# Patient Record
Sex: Male | Born: 1950 | Race: White | Hispanic: No | Marital: Married | Smoking: Current every day smoker
Health system: Southern US, Community
[De-identification: ages and names within clinical notes are randomized; demographics above are authoritative.]

## PROBLEM LIST (undated history)

## (undated) DIAGNOSIS — Z9289 Personal history of other medical treatment: Secondary | ICD-10-CM

## (undated) DIAGNOSIS — I480 Paroxysmal atrial fibrillation: Secondary | ICD-10-CM

## (undated) DIAGNOSIS — I2109 ST elevation (STEMI) myocardial infarction involving other coronary artery of anterior wall: Secondary | ICD-10-CM

## (undated) DIAGNOSIS — I2510 Atherosclerotic heart disease of native coronary artery without angina pectoris: Secondary | ICD-10-CM

## (undated) DIAGNOSIS — M549 Dorsalgia, unspecified: Secondary | ICD-10-CM

## (undated) DIAGNOSIS — Z87442 Personal history of urinary calculi: Secondary | ICD-10-CM

## (undated) DIAGNOSIS — F1020 Alcohol dependence, uncomplicated: Secondary | ICD-10-CM

## (undated) DIAGNOSIS — I10 Essential (primary) hypertension: Secondary | ICD-10-CM

## (undated) DIAGNOSIS — I2129 ST elevation (STEMI) myocardial infarction involving other sites: Secondary | ICD-10-CM

## (undated) DIAGNOSIS — K219 Gastro-esophageal reflux disease without esophagitis: Secondary | ICD-10-CM

## (undated) DIAGNOSIS — C679 Malignant neoplasm of bladder, unspecified: Secondary | ICD-10-CM

## (undated) DIAGNOSIS — G8929 Other chronic pain: Secondary | ICD-10-CM

## (undated) DIAGNOSIS — K922 Gastrointestinal hemorrhage, unspecified: Secondary | ICD-10-CM

## (undated) DIAGNOSIS — R0600 Dyspnea, unspecified: Secondary | ICD-10-CM

## (undated) HISTORY — PX: APPENDECTOMY: SHX54

## (undated) HISTORY — DX: Gastrointestinal hemorrhage, unspecified: K92.2

## (undated) HISTORY — PX: TRANSURETHRAL RESECTION OF PROSTATE: SHX73

## (undated) HISTORY — DX: Dorsalgia, unspecified: M54.9

## (undated) HISTORY — DX: Alcohol dependence, uncomplicated: F10.20

## (undated) HISTORY — DX: Other chronic pain: G89.29

## (undated) HISTORY — PX: CORONARY ANGIOPLASTY: SHX604

---

## 2006-12-28 ENCOUNTER — Encounter: Admission: RE | Admit: 2006-12-28 | Discharge: 2007-01-24 | Payer: Self-pay | Admitting: *Deleted

## 2009-07-23 ENCOUNTER — Encounter
Admission: RE | Admit: 2009-07-23 | Discharge: 2009-09-12 | Payer: Self-pay | Admitting: Physical Medicine and Rehabilitation

## 2009-09-15 ENCOUNTER — Encounter
Admission: RE | Admit: 2009-09-15 | Discharge: 2009-12-14 | Payer: Self-pay | Admitting: Physical Medicine and Rehabilitation

## 2013-06-30 ENCOUNTER — Encounter (INDEPENDENT_AMBULATORY_CARE_PROVIDER_SITE_OTHER): Payer: Self-pay | Admitting: *Deleted

## 2013-07-25 ENCOUNTER — Ambulatory Visit (INDEPENDENT_AMBULATORY_CARE_PROVIDER_SITE_OTHER): Payer: BC Managed Care – PPO | Admitting: Internal Medicine

## 2013-07-25 ENCOUNTER — Encounter (INDEPENDENT_AMBULATORY_CARE_PROVIDER_SITE_OTHER): Payer: Self-pay | Admitting: Internal Medicine

## 2013-07-25 ENCOUNTER — Encounter (INDEPENDENT_AMBULATORY_CARE_PROVIDER_SITE_OTHER): Payer: Self-pay | Admitting: *Deleted

## 2013-07-25 ENCOUNTER — Other Ambulatory Visit (INDEPENDENT_AMBULATORY_CARE_PROVIDER_SITE_OTHER): Payer: Self-pay | Admitting: *Deleted

## 2013-07-25 VITALS — BP 128/72 | HR 88 | Temp 98.5°F | Ht 71.5 in | Wt 178.3 lb

## 2013-07-25 DIAGNOSIS — F1010 Alcohol abuse, uncomplicated: Secondary | ICD-10-CM

## 2013-07-25 DIAGNOSIS — F17200 Nicotine dependence, unspecified, uncomplicated: Secondary | ICD-10-CM

## 2013-07-25 DIAGNOSIS — Z720 Tobacco use: Secondary | ICD-10-CM | POA: Insufficient documentation

## 2013-07-25 DIAGNOSIS — R1310 Dysphagia, unspecified: Secondary | ICD-10-CM

## 2013-07-25 NOTE — Patient Instructions (Signed)
EGD/ED with Dr. Rehman. The risks and benefits such as perforation, bleeding, and infection were reviewed with the patient and is agreeable. 

## 2013-07-25 NOTE — Progress Notes (Signed)
Subjective:     Patient ID: Darren Carr, male   DOB: 06-18-1951, 62 y.o.   MRN: [id number]  HPI Referred to our office by Prudy Feeler [facility] for esophageal stricture. He states foods are lodging in his lower esophagus. He ate "gizzards" about a month ago and it lodged in his lower esophagus. Breads will also lodge. Hx of esophageal stricture in the past and has been dilated. He says he was dilated at [facility]  ( I will get that report.) He is basically on a soft diet because he is edentulous (upper). Appetite is good for the most part. There has been no waste loss. No abdominal pain. He usually has a BM x 1 a day. No melena or bright red rectal bleeding.   Says he has a hx of an Upper GI bleed.   Family HX: Married. 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 He has 6 children. Five are in good health. One daughter in her 30s in in a NH with Schizophrenia.  Review of Systems see hpi Current Outpatient Prescriptions  Medication Sig Dispense Refill  . gabapentin (NEURONTIN) 300 MG capsule Take 300 mg by mouth at bedtime as needed.      Marland Kitchen omeprazole (PRILOSEC) 20 MG capsule Take 20 mg by mouth daily.      Marland Kitchen oxyCODONE (ROXICODONE) 15 MG immediate release tablet Take 15 mg by mouth every 4 (four) hours as needed for pain. 10mg  every 8 hrs.       No current facility-administered medications for this visit.  Marland Kitchen Past Medical History  Diagnosis Date  . Alcoholic   . Chronic back pain    Past Surgical History  Procedure Laterality Date  . Appendectomy     No Known Allergies       Objective:   Physical Exam  Filed Vitals:   07/25/13 1444  BP: 128/72  Pulse: 88  Temp: 98.5 F (36.9 C)  Height: 5' 11.5" (1.816 m)  Weight: 178 lb 4.8 oz (80.876 kg)   Alert and oriented. Skin warm and dry. Oral mucosa is moist.   . Sclera anicteric, conjunctivae is pink. Thyroid not enlarged. No cervical lymphadenopathy. Lungs clear. Heart regular rate and rhythm.  Abdomen is soft. Bowel sounds  are positive. No hepatomegaly. No abdominal masses felt. No tenderness.  No edema to lower extremities.        Assessment:   Solid food dysphagia. Esophageal stricture needs to be ruled out.     Plan:    EGD/ED with Dr. Karilyn Cota.   The risks and benefits such as perforation, bleeding, and infection were reviewed with the patient and is agreeable.

## 2013-08-02 ENCOUNTER — Ambulatory Visit (INDEPENDENT_AMBULATORY_CARE_PROVIDER_SITE_OTHER): Payer: Self-pay | Admitting: Internal Medicine

## 2013-08-07 ENCOUNTER — Encounter (HOSPITAL_COMMUNITY): Payer: Self-pay | Admitting: Pharmacy Technician

## 2013-08-08 ENCOUNTER — Encounter (HOSPITAL_COMMUNITY)
Admission: RE | Admit: 2013-08-08 | Discharge: 2013-08-08 | Disposition: A | Discharge status: Home / Self Care | Payer: BC Managed Care – PPO | Source: Ambulatory Visit | Attending: Internal Medicine | Admitting: Internal Medicine

## 2013-08-08 ENCOUNTER — Encounter (HOSPITAL_COMMUNITY): Payer: Self-pay

## 2013-08-08 ENCOUNTER — Other Ambulatory Visit: Payer: Self-pay

## 2013-08-08 DIAGNOSIS — Z01818 Encounter for other preprocedural examination: Secondary | ICD-10-CM | POA: Insufficient documentation

## 2013-08-08 DIAGNOSIS — Z01812 Encounter for preprocedural laboratory examination: Secondary | ICD-10-CM | POA: Insufficient documentation

## 2013-08-08 DIAGNOSIS — Z01810 Encounter for preprocedural cardiovascular examination: Secondary | ICD-10-CM | POA: Insufficient documentation

## 2013-08-08 LAB — BASIC METABOLIC PANEL
BUN: 9 mg/dL (ref 6–23)
CO2: 25 mEq/L (ref 19–32)
Calcium: 9.6 mg/dL (ref 8.4–10.5)
Creatinine, Ser: 0.85 mg/dL (ref 0.50–1.35)
GFR calc non Af Amer: >90 mL/min (ref >90)
Glucose, Bld: 157 mg/dL — ABNORMAL HIGH (ref 70–99)
Potassium: 3.9 mEq/L (ref 3.5–5.1)

## 2013-08-08 NOTE — Patient Instructions (Signed)
Darren Carr  08/08/2013   Your procedure is scheduled on:  08/16/2013  Report to [facility] at 08/16/2013  AM.  Call this number if you have problems the morning of surgery: [phone]   Remember:   Do not eat food or drink liquids after midnight.   Take these medicines the morning of surgery with A SIP OF WATER:  Neurontin, prilosec, oxycodone   Do not wear jewelry, make-up or nail polish.  Do not wear lotions, powders, or perfumes.   Do not shave 48 hours prior to surgery. Men may shave face and neck.  Do not bring valuables to the hospital.  [facility] is not responsible  for any belongings or valuables.               Contacts, dentures or bridgework may not be worn into surgery.  Leave suitcase in the car. After surgery it may be brought to your room.  For patients admitted to the hospital, discharge time is determined by your treatment team.               Patients discharged the day of surgery will not be allowed to drive home.  Name and phone number of your driver: family  Special Instructions: Shower using CHG 2 nights before surgery and the night before surgery.  If you shower the day of surgery use CHG.  Use special wash - you have one bottle of CHG for all showers.  You should use approximately 1/3 of the bottle for each shower.   Please read over the following fact sheets that you were given: Pain Booklet, Coughing and Deep Breathing, MRSA Information, Surgical Site Infection Prevention, Anesthesia Post-op Instructions and Care and Recovery After Surgery Esophageal Dilatation The esophagus is the long, narrow tube which carries food and liquid from the mouth to the stomach. Esophageal dilatation is the technique used to stretch a blocked or narrowed portion of the esophagus. This procedure is used when a part of the esophagus has become so narrow that it becomes difficult, painful or even impossible to swallow. This is generally an uncomplicated form of treatment.  When this is not successful, chest surgery may be required. This is a much more extensive form of treatment with a longer recovery time. CAUSES  Some of the more common causes of blockage or strictures of the esophagus are:  Narrowing from longstanding inflammation (soreness and redness) of the lower esophagus. This comes from the constant exposure of the lower esophagus to the acid which bubbles up from the stomach. Over time this causes scarring and narrowing of the lower esophagus.  Hiatal hernia in which a small part of the stomach bulges (herniates) up through the diaphragm. This can cause a gradual narrowing of the end of the esophagus.  Schatzki's Ring is a narrow ring of benign (non-cancerous) fibrous tissue which constricts the lower esophagus. The reason for this is not known.  Scleroderma is a connective tissue disorder that affects the esophagus and makes swallowing difficult.  Achalasia is an absence of nerves to the lower esophagus and to the esophageal sphincter. This is the circular muscle between the stomach and esophagus that relaxes to allow food into the stomach. After swallowing, it contracts to keep food in the stomach. This absence of nerves may be congenital (present since birth). This can cause irregular spasms of the lower esophageal muscle. This spasm does not open up to allow food and fluid through. The result is a  persistent blockage with subsequent slow trickling of the esophageal contents into the stomach.  Strictures may develop from swallowing materials which damage the esophagus. Some examples are strong acids or alkalis such as lye.  Growths such as benign (non-cancerous) and malignant (cancerous) tumors can block the esophagus.  Heredity (present since birth) causes. DIAGNOSIS  Your caregiver often suspects this problem by taking a medical history. They will also do a physical exam. They can then prove their suspicions using X-rays and endoscopy. Endoscopy is  an exam in which a tube like a small flexible telescope is used to look at your esophagus.  TREATMENT There are different stretching (dilating) techniques which can be used. Simple bougie dilatation may be done in the office. This usually takes only a couple minutes. A numbing (anesthetic) spray of the throat is used. Endoscopy, when done, is done in an endoscopy suite, under mild sedation. When fluoroscopy is used, the procedure is performed in X-ray. Other techniques require a little longer time. Recovery is usually quick. There is no waiting time to begin eating and drinking to test success of the treatment. Following are some of the methods used. Narrowing of the esophagus is treated by making it bigger. Commonly this is a mechanical problem which can be treated with stretching. This can be done in different ways. Your caregiver will discuss these with you. Some of the means used are:  A series of graduated (increasing thickness) flexible dilators can be used. These are weighted tubes passed through the esophagus into the stomach. The tubes used become progressively larger until the desired stretched size is reached. Graduated dilators are a simple and quick way of opening the esophagus. No visualization is required.  Another method is the use of endoscopy to place a flexible wire across the stricture. The endoscope is removed and the wire left in place. A dilator with a hole through it from end to end is guided down the esophagus and across the stricture. One or more of these dilators are passed over the wire. At the end of the exam, the wire is removed. This type of treatment may be performed in the X-ray department under fluoroscopy. An advantage of this procedure is the examiner is visualizing the end opening in the esophagus.  Stretching of the esophagus may be done using balloons. Deflated balloons are placed through the endoscope and across the stricture. This type of balloon dilatation is often  done at the time of endoscopy or fluoroscopy. Flexible endoscopy allows the examiner to directly view the stricture. A balloon is inserted in the deflated form into the area of narrowing. It is then inflated with air to a certain pressure that is pre-set for a given circumference. When inflated, it becomes sausage shaped, stretched, and makes the stricture larger.  Achalasia requires a longer larger balloon-type dilator. This is frequently done under X-ray control. In this situation, the spastic muscle fibers in the lower esophagus are stretched. All of the above procedures make the passage of food and water into the stomach easier. They also make it easier for stomach contents to reflux back into the esophagus. Special medications may be used following the procedure to help prevent further stricturing. Proton-pump inhibitor medications are good at decreasing the amount of acid in the stomach juice. When stomach juice refluxes into the esophagus, the juice is no longer as acidic and is less likely to burn or scar the esophagus. RISKS AND COMPLICATIONS Esophageal dilatation is usually performed effectively and without problems. Some  complications that can occur are:  A small amount of bleeding almost always happens where the stretching takes place. If this is too excessive it may require more aggressive treatment.  An uncommon complication is perforation (making a hole) of the esophagus. The esophagus is thin. It is easy to make a hole in it. If this happens, an operation may be necessary to repair this.  A small, undetected perforation could lead to an infection in the chest. This can be very serious. HOME CARE INSTRUCTIONS   If you received sedation for your procedure, do not drive, make important decisions, or perform any activities requiring your full coordination. Do not drink alcohol, take sedatives, or use any mind altering chemicals unless instructed by your caregiver.  You may use throat  lozenges or warm salt water gargles if you have throat discomfort  You can begin eating and drinking normally on return home unless instructed otherwise. Do not purposely try to force large chunks of food down to test the benefits of your procedure.  Mild discomfort can be eased with sips of ice water.  Medications for discomfort may or may not be needed. SEEK IMMEDIATE MEDICAL CARE IF:   You begin vomiting up blood.  You develop black tarry stools  You develop chills or an unexplained temperature of over 101 F (38.3 C)  You develop chest or abdominal pain.  You develop shortness of breath or feel lightheaded or faint.  Your swallowing is becoming more painful, difficult, or you are unable to swallow. MAKE SURE YOU:   Understand these instructions.  Will watch your condition.  Will get help right away if you are not doing well or get worse. Document Released: 10/22/2005 Document Revised: 11/23/2011 Document Reviewed: 12/09/2005 [facility] Patient Information 2014 [place], [place]. Esophagogastroduodenoscopy Esophagogastroduodenoscopy (EGD) is a procedure to examine the lining of the esophagus, stomach, and first part of the small intestine (duodenum). A long, flexible, lighted tube with a camera attached (endoscope) is inserted down the throat to view these organs. This procedure is done to detect problems or abnormalities, such as inflammation, bleeding, ulcers, or growths, in order to treat them. The procedure lasts about 5 20 minutes. It is usually an outpatient procedure, but it may need to be performed in emergency cases in the hospital. LET YOUR CAREGIVER KNOW ABOUT:   Allergies to food or medicine.  All medicines you are taking, including vitamins, herbs, eyedrops, and over-the-counter medicines and creams.  Use of steroids (by mouth or creams).  Previous problems you or members of your family have had with the use of anesthetics.  Any blood disorders you  have.  Previous surgeries you have had.  Other health problems you have.  Possibility of pregnancy, if this applies. RISKS AND COMPLICATIONS  Generally, EGD is a safe procedure. However, as with any procedure, complications can occur. Possible complications include:  Infection.  Bleeding.  Tearing (perforation) of the esophagus, stomach, or duodenum.  Difficulty breathing or not being able to breath.  Excessive sweating.  Spasms of the larynx.  Slowed heartbeat.  Low blood pressure. BEFORE THE PROCEDURE  Do not eat or drink anything for 6 8 hours before the procedure or as directed by your caregiver.  Ask your caregiver about changing or stopping your regular medicines.  If you wear dentures, be prepared to remove them before the procedure.  Arrange for someone to drive you home after the procedure. PROCEDURE   A vein will be accessed to give medicines and fluids. A medicine  to relax you (sedative) and a pain reliever will be given through that access into the vein.  A numbing medicine (local anesthetic) may be sprayed on your throat for comfort and to stop you from gagging or coughing.  A mouth guard may be placed in your mouth to protect your teeth and to keep you from biting on the endoscope.  You will be asked to lie on your left side.  The endoscope is inserted down your throat and into the esophagus, stomach, and duodenum.  Air is put through the endoscope to allow your caregiver to view the lining of your esophagus clearly.  The esophagus, stomach, and duodenum is then examined. During the exam, your caregiver may:  Remove tissue to be examined under a microscope (biopsy) for inflammation, infection, or other medical problems.  Remove growths.  Remove objects (foreign bodies) that are stuck.  Treat any bleeding with medicines or other devices that stop tissues from bleeding (hot cauters, clipping devices).  Widen (dilate) or stretch narrowed areas of  the esophagus and stomach.  The endoscope will then be withdrawn. AFTER THE PROCEDURE  You will be taken to a recovery area to be monitored. You will be able to go home once you are stable and alert.  Do not eat or drink anything until the local anesthetic and numbing medicines have worn off. You may choke.  It is normal to feel bloated, have pain with swallowing, or have a sore throat for a short time. This will wear off.  Your caregiver should be able to discuss his or her findings with you. It will take longer to discuss the test results if any biopsies were taken. Document Released: 01/01/2005 Document Revised: 08/17/2012 Document Reviewed: 08/03/2012 [facility] Patient Information 2014 [place], [place].

## 2013-08-16 ENCOUNTER — Encounter (HOSPITAL_COMMUNITY): Payer: BC Managed Care – PPO | Admitting: Anesthesiology

## 2013-08-16 ENCOUNTER — Encounter (HOSPITAL_COMMUNITY)
Admission: RE | Disposition: A | Discharge status: Home / Self Care | Payer: Self-pay | Source: Ambulatory Visit | Attending: Internal Medicine

## 2013-08-16 ENCOUNTER — Ambulatory Visit (HOSPITAL_COMMUNITY): Payer: BC Managed Care – PPO | Admitting: Anesthesiology

## 2013-08-16 ENCOUNTER — Encounter (HOSPITAL_COMMUNITY): Payer: Self-pay | Admitting: *Deleted

## 2013-08-16 ENCOUNTER — Ambulatory Visit (HOSPITAL_COMMUNITY)
Admission: RE | Admit: 2013-08-16 | Discharge: 2013-08-16 | Disposition: A | Discharge status: Home / Self Care | Payer: BC Managed Care – PPO | Source: Ambulatory Visit | Attending: Internal Medicine | Admitting: Internal Medicine

## 2013-08-16 DIAGNOSIS — R1310 Dysphagia, unspecified: Secondary | ICD-10-CM

## 2013-08-16 DIAGNOSIS — K228 Other specified diseases of esophagus: Secondary | ICD-10-CM

## 2013-08-16 DIAGNOSIS — K222 Esophageal obstruction: Secondary | ICD-10-CM

## 2013-08-16 DIAGNOSIS — K219 Gastro-esophageal reflux disease without esophagitis: Secondary | ICD-10-CM

## 2013-08-16 DIAGNOSIS — K2960 Other gastritis without bleeding: Secondary | ICD-10-CM | POA: Insufficient documentation

## 2013-08-16 DIAGNOSIS — K449 Diaphragmatic hernia without obstruction or gangrene: Secondary | ICD-10-CM | POA: Insufficient documentation

## 2013-08-16 DIAGNOSIS — K2270 Barrett's esophagus without dysplasia: Secondary | ICD-10-CM | POA: Insufficient documentation

## 2013-08-16 HISTORY — PX: BIOPSY: SHX5522

## 2013-08-16 HISTORY — PX: BALLOON DILATION: SHX5330

## 2013-08-16 HISTORY — PX: ESOPHAGOGASTRODUODENOSCOPY (EGD) WITH PROPOFOL: SHX5813

## 2013-08-16 LAB — HEPATIC FUNCTION PANEL
AST: 21 U/L (ref 0–37)
Albumin: 3.5 g/dL (ref 3.5–5.2)
Indirect Bilirubin: 0.4 mg/dL (ref 0.3–0.9)
Total Bilirubin: 0.6 mg/dL (ref 0.3–1.2)

## 2013-08-16 SURGERY — ESOPHAGOGASTRODUODENOSCOPY (EGD) WITH PROPOFOL
Anesthesia: Monitor Anesthesia Care

## 2013-08-16 MED ORDER — FENTANYL CITRATE 0.05 MG/ML IJ SOLN: 25.0000 ug | INTRAMUSCULAR | Status: DC | PRN

## 2013-08-16 MED ORDER — PROPOFOL 10 MG/ML IV BOLUS
INTRAVENOUS | Status: AC
  Filled 2013-08-16: qty 20

## 2013-08-16 MED ORDER — MIDAZOLAM HCL 2 MG/2ML IJ SOLN
1.0000 mg | INTRAMUSCULAR | Status: DC | PRN
  Administered 2013-08-16: 2 mg via INTRAVENOUS

## 2013-08-16 MED ORDER — FENTANYL CITRATE 0.05 MG/ML IJ SOLN
INTRAMUSCULAR | Status: DC | PRN
  Administered 2013-08-16: 50 ug via INTRAVENOUS

## 2013-08-16 MED ORDER — GLYCOPYRROLATE 0.2 MG/ML IJ SOLN
INTRAMUSCULAR | Status: AC
  Filled 2013-08-16: qty 1

## 2013-08-16 MED ORDER — ONDANSETRON HCL 4 MG/2ML IJ SOLN
INTRAMUSCULAR | Status: AC
  Filled 2013-08-16: qty 2

## 2013-08-16 MED ORDER — LABETALOL HCL 5 MG/ML IV SOLN
10.0000 mg | Freq: Once | INTRAVENOUS | Status: AC
  Administered 2013-08-16: 10 mg via INTRAVENOUS

## 2013-08-16 MED ORDER — LIDOCAINE HCL (PF) 1 % IJ SOLN
INTRAMUSCULAR | Status: AC
  Filled 2013-08-16: qty 5

## 2013-08-16 MED ORDER — LACTATED RINGERS IV SOLN
INTRAVENOUS | Status: DC
  Administered 2013-08-16: 1000 mL via INTRAVENOUS

## 2013-08-16 MED ORDER — GLYCOPYRROLATE 0.2 MG/ML IJ SOLN
0.2000 mg | Freq: Once | INTRAMUSCULAR | Status: AC
Start: 2013-08-16 — End: 2013-08-16
  Administered 2013-08-16: 0.2 mg via INTRAVENOUS

## 2013-08-16 MED ORDER — MIDAZOLAM HCL 2 MG/2ML IJ SOLN
INTRAMUSCULAR | Status: AC
  Filled 2013-08-16: qty 2

## 2013-08-16 MED ORDER — BUTAMBEN-TETRACAINE-BENZOCAINE 2-2-14 % EX AERO
1.0000 | INHALATION_SPRAY | Freq: Once | CUTANEOUS | Status: AC
  Administered 2013-08-16: 1 via TOPICAL
  Filled 2013-08-16: qty 56

## 2013-08-16 MED ORDER — FENTANYL CITRATE 0.05 MG/ML IJ SOLN
INTRAMUSCULAR | Status: AC
  Filled 2013-08-16: qty 2

## 2013-08-16 MED ORDER — STERILE WATER FOR IRRIGATION IR SOLN
Status: DC | PRN
  Administered 2013-08-16: 08:00:00

## 2013-08-16 MED ORDER — LABETALOL HCL 5 MG/ML IV SOLN
INTRAVENOUS | Status: AC
  Filled 2013-08-16: qty 4

## 2013-08-16 MED ORDER — PROPOFOL INFUSION 10 MG/ML OPTIME
INTRAVENOUS | Status: DC | PRN
  Administered 2013-08-16: 125 ug/kg/min via INTRAVENOUS

## 2013-08-16 MED ORDER — ONDANSETRON HCL 4 MG/2ML IJ SOLN: 4.0000 mg | Freq: Once | INTRAMUSCULAR | Status: DC | PRN

## 2013-08-16 MED ORDER — FENTANYL CITRATE 0.05 MG/ML IJ SOLN
25.0000 ug | INTRAMUSCULAR | Status: AC
  Administered 2013-08-16 (×2): 25 ug via INTRAVENOUS

## 2013-08-16 MED ORDER — ONDANSETRON HCL 4 MG/2ML IJ SOLN
4.0000 mg | Freq: Once | INTRAMUSCULAR | Status: AC
  Administered 2013-08-16: 4 mg via INTRAVENOUS

## 2013-08-16 MED ORDER — MIDAZOLAM HCL 2 MG/2ML IJ SOLN
INTRAMUSCULAR | Status: DC | PRN
  Administered 2013-08-16 (×2): 1 mg via INTRAVENOUS

## 2013-08-16 SURGICAL SUPPLY — 13 items
BALLN DILATOR CRE 15-18 240 (BALLOONS) ×1 IMPLANT
BLOCK BITE 60FR ADLT L/F BLUE (MISCELLANEOUS) ×3 IMPLANT
FLOOR PAD 36X40 (MISCELLANEOUS) ×3
FORCEPS BIOP RAD 4 LRG CAP 4 (CUTTING FORCEPS) ×1 IMPLANT
FORMALIN 10 PREFIL 20ML (MISCELLANEOUS) ×1 IMPLANT
KIT CLEAN ENDO COMPLIANCE (KITS) ×3 IMPLANT
MANIFOLD NEPTUNE II (INSTRUMENTS) ×3 IMPLANT
PAD FLOOR 36X40 (MISCELLANEOUS) ×2 IMPLANT
SYR 50ML LL SCALE MARK (SYRINGE) ×1 IMPLANT
SYR INFLATION 60ML (SYRINGE) ×1 IMPLANT
TUBING ENDO SMARTCAP PENTAX (MISCELLANEOUS) ×3 IMPLANT
TUBING IRRIGATION ENDOGATOR (MISCELLANEOUS) ×3 IMPLANT
WATER STERILE IRR 1000ML POUR (IV SOLUTION) ×1 IMPLANT

## 2013-08-16 NOTE — Op Note (Signed)
EGD PROCEDURE REPORT  PATIENT:  Darren Carr  MR#:  [id number] Birthdate:  [birth year], 62 y.o., male Endoscopist:  Dr. Malissa Hippo, MD Referred By:  Dr. Champ Mungo. Prudy Feeler, PA-C  Procedure Date: 08/16/2013  Procedure:   EGD with ED.  Indications:  Patient is 62 year old Caucasian male who was history of GERD and esophageal stricture who presents with diminished solid food dysphagia. Os is a facial dilation was elsewhere 2 years ago. Patient has PPI scription but he does not take it daily.            Informed Consent:  The risks, benefits, alternatives & imponderables which include, but are not limited to, bleeding, infection, perforation, drug reaction and potential missed lesion have been reviewed.  The potential for biopsy, lesion removal, esophageal dilation, etc. have also been discussed.  Questions have been answered.  All parties agreeable.  Please see history & physical in medical record for more information.  Medications:  Cetacaine spray topically for oropharyngeal anesthesia Monitored anesthesia care; please see anesthesia records for details.  Description of procedure:  The endoscope was introduced through the mouth and advanced to the second portion of the duodenum without difficulty or limitations. The mucosal surfaces were surveyed very carefully during advancement of the scope and upon withdrawal.  Findings:  Esophagus:  Mucosa of the proximal and middle third was normal. Scarring noted at distal esophagus with stricture estimated to be 9-10 mm. GE junction was serrated with mucosal appearance suspicious for a short segment Barrett's. Stricture was initially dilated by passing the scope and subsequently with the balloon dilator as below. GEJ:  37 cm Hiatus:  40 cm Stomach:  Stomach was empty and distended very well with insufflation. Folds in the proximal stomach were unremarkable. Examination of mucosa at body was normal. Antral mucosa revealed patchy linear erythema but no  erosions or ulcers were noted. Pyloric channel was patent. Angularis fundus and cardia were examined by retroflex in the scope and were unremarkable. Duodenum:  Normal bulbar and post bulbar mucosa.  Therapeutic/Diagnostic Maneuvers Performed:   Distal esophageal stricture was dilated with balloon dilator. The balloon dilator was advanced the scope. The guidewire was pushed into the gastric lumen. The balloon dilator was positioned across the stricture and insufflated to a diameter of 15 mm and subsequently to 16.5 mm. Balloon passed distally innsufflated position. It was then deflated and withdrawn. Biopsy was taken from mucosa at GE junction to rule out short segment Barrett's. Endoscope was withdrawn.  Complications:  None   Impression: High grade stricture at distal esophagus dilated to 16.5 mm with balloon dilator. Serrated GE junction. Biopsy taken to rule out short segment Barrett's.  Small sliding hiatal hernia. Nonerosive antral gastritis.  Recommendations:  Anti-refluxmeasures reinforced and patient advised to take omeprazole daily as directed. Will check LFTs and H. pylori serology today. Repeat dilation in 4 weeks.    [name]  08/16/2013  8:15 AM  CC: Dr. Remus Loffler, PA-C & Dr. Bonnetta Barry ref. provider found

## 2013-08-16 NOTE — Anesthesia Postprocedure Evaluation (Signed)
Anesthesia Post Note  Patient: Darren Carr  Procedure(s) Performed: Procedure(s) (LRB): ESOPHAGOGASTRODUODENOSCOPY (EGD) WITH PROPOFOL Hiatus at 40cm Gastroesophageal Junction at 37cm (N/A) BALLOON DILATION to 16.79mm (N/A) DISTAL ESOPHAGEAL BIOPSIES  Anesthesia type: MAC  Patient location: PACU  Post pain: Pain level controlled  Post assessment: Post-op Vital signs reviewed, Patient's Cardiovascular Status Stable, Respiratory Function Stable, Patent Airway, No signs of Nausea or vomiting and Pain level controlled  Last Vitals:  Filed Vitals:   08/16/13 0817  BP: 128/93  Pulse: 78  Temp: 36.7 C  Resp: 10    Post vital signs: Reviewed and stable  Level of consciousness: awake and alert   Complications: No apparent anesthesia complications

## 2013-08-16 NOTE — Anesthesia Procedure Notes (Signed)
Procedure Name: MAC Date/Time: 08/16/2013 7:43 AM Performed by: Franco Nones Pre-anesthesia Checklist: Patient identified, Emergency Drugs available, Suction available, Timeout performed and Patient being monitored Patient Re-evaluated:Patient Re-evaluated prior to inductionOxygen Delivery Method: Non-rebreather mask

## 2013-08-16 NOTE — H&P (Signed)
Darren Carr is an 62 y.o. male.   Chief Complaint: Patient's here for EGD and ED. HPI: Patient is 62 year old Caucasian male who has chronic GERD and history of esophageal stricture who presents with intermittent solid food dysphagia. He had his esophagus dilated 2 years ago in [facility]. He says his heartburn controlled with therapy but he does not take omeprazole daily. His wife states he has lost several pounds this year. He denies nausea vomiting hematemesis melena or rectal bleeding. Patient continue to drink 6-12 cans of beer daily.  Past Medical History  Diagnosis Date  . Alcoholic   . Chronic back pain   . Upper GI bleed     Past Surgical History  Procedure Laterality Date  . Appendectomy      History reviewed. No pertinent family history. Social History:  reports that he has been smoking Cigarettes.  He has a 66 pack-year smoking history. He does not have any smokeless tobacco history on file. He reports that he drinks alcohol. He reports that he does not use illicit drugs.  Allergies: No Known Allergies  Medications Prior to Admission  Medication Sig Dispense Refill  . gabapentin (NEURONTIN) 300 MG capsule Take 300 mg by mouth at bedtime as needed.      Marland Kitchen omeprazole (PRILOSEC) 20 MG capsule Take 20 mg by mouth daily.      Marland Kitchen oxyCODONE (ROXICODONE) 15 MG immediate release tablet Take 15 mg by mouth every 4 (four) hours as needed for pain. 10mg  every 8 hrs.        No results found for this or any previous visit (from the past 48 hour(s)). No results found.  ROS  Blood pressure 154/106, pulse 80, temperature 98 F (36.7 C), temperature source Oral, resp. rate 20, height 5' 10.5" (1.791 m), weight 179 lb (81.194 kg), SpO2 94.00%. Physical Exam  Constitutional: He appears well-developed.  HENT:  Mouth/Throat: Oropharynx is clear and moist.  Eyes: Conjunctivae are normal. No scleral icterus.  Neck: No thyromegaly present.  Cardiovascular: Normal rate and  regular rhythm.   No murmur heard. Respiratory: Effort normal and breath sounds normal.  GI: Soft. He exhibits no distension and no mass. There is no tenderness.  Lymphadenopathy:    He has no cervical adenopathy.  Neurological: He is alert.  Skin: Skin is warm.  Face is flushed.     Assessment/Plan Solid food dysphagia. Chronic GERD. Ongoing excessive alcohol intake. Patient has been advised to consider rehabilitation for his too late. EGD with ED with monitored anesthesia care.  Meia Emley U 08/16/2013, 7:33 AM

## 2013-08-16 NOTE — Progress Notes (Signed)
h pylori and hepatic function pnl drawn and sent to lab.

## 2013-08-16 NOTE — Transfer of Care (Signed)
Immediate Anesthesia Transfer of Care Note  Patient: Darren Carr  Procedure(s) Performed: Procedure(s) (LRB): ESOPHAGOGASTRODUODENOSCOPY (EGD) WITH PROPOFOL Hiatus at 40cm Gastroesophageal Junction at 37cm (N/A) BALLOON DILATION to 16.52mm (N/A) DISTAL ESOPHAGEAL BIOPSIES  Patient Location: PACU  Anesthesia Type: MAC  Level of Consciousness: awake  Airway & Oxygen Therapy: Patient Spontanous Breathing.   Post-op Assessment: Report given to PACU RN, Post -op Vital signs reviewed and stable and Patient moving all extremities  Post vital signs: Reviewed and stable  Complications: No apparent anesthesia complications

## 2013-08-16 NOTE — Anesthesia Preprocedure Evaluation (Addendum)
Anesthesia Evaluation  Patient identified by MRN, date of birth, ID band Patient awake    Reviewed: Allergy & Precautions, H&P , NPO status , Patient's Chart, lab work & pertinent test results  Airway Mallampati: I TM Distance: >3 FB     Dental  (+) Edentulous Upper, Poor Dentition and Dental Advisory Given   Pulmonary COPDCurrent Smoker,  breath sounds clear to auscultation        Cardiovascular hypertension (untreated), negative cardio ROS  Rhythm:Regular Rate:Normal     Neuro/Psych    GI/Hepatic (+)     substance abuse  alcohol use, Dysphagia    Endo/Other    Renal/GU      Musculoskeletal   Abdominal   Peds  Hematology   Anesthesia Other Findings Start labetolol preoperatively.  Reproductive/Obstetrics                          Anesthesia Physical Anesthesia Plan  ASA: III  Anesthesia Plan: MAC   Post-op Pain Management:    Induction: Intravenous  Airway Management Planned: Simple Face Mask  Additional Equipment:   Intra-op Plan:   Post-operative Plan:   Informed Consent: I have reviewed the patients History and Physical, chart, labs and discussed the procedure including the risks, benefits and alternatives for the proposed anesthesia with the patient or authorized representative who has indicated his/her understanding and acceptance.     Plan Discussed with:   Anesthesia Plan Comments:         Anesthesia Quick Evaluation

## 2013-08-16 NOTE — Preoperative (Signed)
Beta Blockers   Reason not to administer Beta Blockers:Not Applicable 

## 2013-08-17 LAB — H. PYLORI ANTIBODY, IGG: H Pylori IgG: >8 {ISR} — ABNORMAL HIGH

## 2013-08-18 ENCOUNTER — Encounter (HOSPITAL_COMMUNITY): Payer: Self-pay | Admitting: Internal Medicine

## 2013-08-21 ENCOUNTER — Encounter (INDEPENDENT_AMBULATORY_CARE_PROVIDER_SITE_OTHER): Payer: Self-pay | Admitting: *Deleted

## 2013-09-19 ENCOUNTER — Encounter (INDEPENDENT_AMBULATORY_CARE_PROVIDER_SITE_OTHER): Payer: Self-pay | Admitting: *Deleted

## 2014-07-15 DIAGNOSIS — C679 Malignant neoplasm of bladder, unspecified: Secondary | ICD-10-CM

## 2014-07-15 HISTORY — DX: Malignant neoplasm of bladder, unspecified: C67.9

## 2014-12-03 ENCOUNTER — Ambulatory Visit (HOSPITAL_COMMUNITY)
Admission: RE | Admit: 2014-12-03 | Discharge: 2014-12-03 | Disposition: A | Discharge status: Home / Self Care | Payer: BLUE CROSS/BLUE SHIELD | Source: Ambulatory Visit | Attending: Urology | Admitting: Urology

## 2014-12-03 ENCOUNTER — Other Ambulatory Visit (HOSPITAL_COMMUNITY): Payer: Self-pay | Admitting: Urology

## 2014-12-03 DIAGNOSIS — C672 Malignant neoplasm of lateral wall of bladder: Secondary | ICD-10-CM

## 2014-12-04 ENCOUNTER — Other Ambulatory Visit: Payer: Self-pay | Admitting: Urology

## 2014-12-04 ENCOUNTER — Encounter (HOSPITAL_COMMUNITY): Payer: Self-pay | Admitting: *Deleted

## 2014-12-05 ENCOUNTER — Ambulatory Visit (HOSPITAL_COMMUNITY): Payer: BLUE CROSS/BLUE SHIELD | Admitting: Certified Registered Nurse Anesthetist

## 2014-12-05 ENCOUNTER — Ambulatory Visit (HOSPITAL_COMMUNITY)
Admission: RE | Admit: 2014-12-05 | Discharge: 2014-12-05 | Disposition: A | Discharge status: Home / Self Care | Payer: BLUE CROSS/BLUE SHIELD | Source: Ambulatory Visit | Attending: Urology | Admitting: Urology

## 2014-12-05 ENCOUNTER — Encounter (HOSPITAL_COMMUNITY)
Admission: RE | Disposition: A | Discharge status: Home / Self Care | Payer: Self-pay | Source: Ambulatory Visit | Attending: Urology

## 2014-12-05 ENCOUNTER — Encounter (HOSPITAL_COMMUNITY): Payer: Self-pay | Admitting: Certified Registered Nurse Anesthetist

## 2014-12-05 DIAGNOSIS — I10 Essential (primary) hypertension: Secondary | ICD-10-CM | POA: Diagnosis not present

## 2014-12-05 DIAGNOSIS — N323 Diverticulum of bladder: Secondary | ICD-10-CM | POA: Insufficient documentation

## 2014-12-05 DIAGNOSIS — N1330 Unspecified hydronephrosis: Secondary | ICD-10-CM | POA: Insufficient documentation

## 2014-12-05 DIAGNOSIS — F17210 Nicotine dependence, cigarettes, uncomplicated: Secondary | ICD-10-CM | POA: Insufficient documentation

## 2014-12-05 DIAGNOSIS — K219 Gastro-esophageal reflux disease without esophagitis: Secondary | ICD-10-CM | POA: Diagnosis not present

## 2014-12-05 DIAGNOSIS — C679 Malignant neoplasm of bladder, unspecified: Secondary | ICD-10-CM | POA: Insufficient documentation

## 2014-12-05 HISTORY — PX: CYSTOSCOPY W/ URETERAL STENT PLACEMENT: SHX1429

## 2014-12-05 HISTORY — PX: TRANSURETHRAL RESECTION OF BLADDER TUMOR WITH GYRUS (TURBT-GYRUS): SHX6458

## 2014-12-05 HISTORY — DX: Essential (primary) hypertension: I10

## 2014-12-05 LAB — CBC
HCT: 48.6 % (ref 39.0–52.0)
HEMOGLOBIN: 16.8 g/dL (ref 13.0–17.0)
MCH: 31.9 pg (ref 26.0–34.0)
MCHC: 34.6 g/dL (ref 30.0–36.0)
MCV: 92.2 fL (ref 78.0–100.0)
PLATELETS: 194 10*3/uL (ref 150–400)
RBC: 5.27 MIL/uL (ref 4.22–5.81)
RDW: 14.7 % (ref 11.5–15.5)
WBC: 8.8 10*3/uL (ref 4.0–10.5)

## 2014-12-05 LAB — BASIC METABOLIC PANEL
Anion gap: 12 (ref 5–15)
BUN: 11 mg/dL (ref 6–23)
CHLORIDE: 100 mmol/L (ref 96–112)
CO2: 23 mmol/L (ref 19–32)
Calcium: 9 mg/dL (ref 8.4–10.5)
Creatinine, Ser: 1.29 mg/dL (ref 0.50–1.35)
GFR calc non Af Amer: 58 mL/min — ABNORMAL LOW (ref >90)
GFR, EST AFRICAN AMERICAN: 68 mL/min — AB (ref >90)
Glucose, Bld: 118 mg/dL — ABNORMAL HIGH (ref 70–99)
Potassium: 4.8 mmol/L (ref 3.5–5.1)
Sodium: 135 mmol/L (ref 135–145)

## 2014-12-05 SURGERY — TRANSURETHRAL RESECTION OF BLADDER TUMOR WITH GYRUS (TURBT-GYRUS)
Anesthesia: General

## 2014-12-05 MED ORDER — FENTANYL CITRATE 0.05 MG/ML IJ SOLN
INTRAMUSCULAR | Status: AC
  Filled 2014-12-05: qty 2

## 2014-12-05 MED ORDER — OXYCODONE HCL 5 MG PO TABS
10.0000 mg | ORAL_TABLET | Freq: Once | ORAL | Status: AC
  Administered 2014-12-05: 10 mg via ORAL
  Filled 2014-12-05: qty 2

## 2014-12-05 MED ORDER — PROPOFOL 10 MG/ML IV BOLUS
INTRAVENOUS | Status: DC | PRN
  Administered 2014-12-05 (×3): 50 mg via INTRAVENOUS
  Administered 2014-12-05: 200 mg via INTRAVENOUS

## 2014-12-05 MED ORDER — LACTATED RINGERS IV SOLN: INTRAVENOUS | Status: DC

## 2014-12-05 MED ORDER — LACTATED RINGERS IV SOLN
INTRAVENOUS | Status: DC
Start: 2014-12-05 — End: 2014-12-05
  Administered 2014-12-05: 1000 mL via INTRAVENOUS

## 2014-12-05 MED ORDER — OXYCODONE HCL 10 MG PO TABS: 10.0000 mg | ORAL_TABLET | ORAL | Status: DC | PRN

## 2014-12-05 MED ORDER — MIDAZOLAM HCL 5 MG/5ML IJ SOLN
INTRAMUSCULAR | Status: DC | PRN
  Administered 2014-12-05: 2 mg via INTRAVENOUS

## 2014-12-05 MED ORDER — DEXTROSE 5 % IV SOLN
380.0000 mg | Freq: Once | INTRAVENOUS | Status: DC
  Filled 2014-12-05: qty 9.5

## 2014-12-05 MED ORDER — PROPOFOL 10 MG/ML IV BOLUS
INTRAVENOUS | Status: AC
  Filled 2014-12-05: qty 20

## 2014-12-05 MED ORDER — LIDOCAINE HCL 1 % IJ SOLN
INTRAMUSCULAR | Status: DC | PRN
  Administered 2014-12-05: 80 mg via INTRADERMAL

## 2014-12-05 MED ORDER — ONDANSETRON HCL 4 MG/2ML IJ SOLN: 4.0000 mg | Freq: Once | INTRAMUSCULAR | Status: DC | PRN

## 2014-12-05 MED ORDER — GENTAMICIN IN SALINE 1.6-0.9 MG/ML-% IV SOLN
80.0000 mg | INTRAVENOUS | Status: DC
  Administered 2014-12-05: 80 mg via INTRAVENOUS

## 2014-12-05 MED ORDER — HYDROMORPHONE HCL 1 MG/ML IJ SOLN
0.2500 mg | INTRAMUSCULAR | Status: DC | PRN
  Administered 2014-12-05 (×2): 0.5 mg via INTRAVENOUS

## 2014-12-05 MED ORDER — HYDROMORPHONE HCL 1 MG/ML IJ SOLN
INTRAMUSCULAR | Status: DC
Start: 2014-12-05 — End: 2014-12-05
  Filled 2014-12-05: qty 1

## 2014-12-05 MED ORDER — ONDANSETRON HCL 4 MG/2ML IJ SOLN
INTRAMUSCULAR | Status: DC | PRN
  Administered 2014-12-05: 4 mg via INTRAVENOUS

## 2014-12-05 MED ORDER — BELLADONNA ALKALOIDS-OPIUM 16.2-60 MG RE SUPP
RECTAL | Status: AC
  Filled 2014-12-05: qty 1

## 2014-12-05 MED ORDER — ONDANSETRON HCL 4 MG/2ML IJ SOLN
INTRAMUSCULAR | Status: AC
  Filled 2014-12-05: qty 2

## 2014-12-05 MED ORDER — FENTANYL CITRATE 0.05 MG/ML IJ SOLN
INTRAMUSCULAR | Status: DC | PRN
  Administered 2014-12-05 (×2): 100 ug via INTRAVENOUS

## 2014-12-05 MED ORDER — LIDOCAINE HCL (CARDIAC) 20 MG/ML IV SOLN
INTRAVENOUS | Status: AC
  Filled 2014-12-05: qty 5

## 2014-12-05 MED ORDER — MEPERIDINE HCL 50 MG/ML IJ SOLN: 6.2500 mg | INTRAMUSCULAR | Status: DC | PRN

## 2014-12-05 MED ORDER — LIDOCAINE HCL 2 % EX GEL
CUTANEOUS | Status: AC
  Filled 2014-12-05: qty 10

## 2014-12-05 MED ORDER — LACTATED RINGERS IV SOLN
INTRAVENOUS | Status: DC | PRN
  Administered 2014-12-05 (×2): via INTRAVENOUS

## 2014-12-05 MED ORDER — IOHEXOL 300 MG/ML  SOLN
INTRAMUSCULAR | Status: DC | PRN
  Administered 2014-12-05: 18 mL via INTRAVENOUS

## 2014-12-05 MED ORDER — SODIUM CHLORIDE 0.9 % IR SOLN
Status: DC | PRN
  Administered 2014-12-05: 1000 mL

## 2014-12-05 MED ORDER — MIDAZOLAM HCL 2 MG/2ML IJ SOLN
INTRAMUSCULAR | Status: AC
  Filled 2014-12-05: qty 2

## 2014-12-05 SURGICAL SUPPLY — 16 items
BAG URINE DRAINAGE (UROLOGICAL SUPPLIES) ×2 IMPLANT
BAG URO CATCHER STRL LF (DRAPE) ×4 IMPLANT
CATH FOLEY 3WAY 30CC 22FR (CATHETERS) ×2 IMPLANT
DRAPE CAMERA CLOSED 9X96 (DRAPES) ×2 IMPLANT
GLOVE BIOGEL M STRL SZ7.5 (GLOVE) ×4 IMPLANT
GOWN STRL REUS W/TWL LRG LVL3 (GOWN DISPOSABLE) ×10 IMPLANT
HOLDER FOLEY CATH W/STRAP (MISCELLANEOUS) IMPLANT
IV NS IRRIG 3000ML ARTHROMATIC (IV SOLUTION) ×8 IMPLANT
LOOP CUT BIPOLAR 24F LRG (ELECTROSURGICAL) ×4 IMPLANT
MANIFOLD NEPTUNE II (INSTRUMENTS) ×4 IMPLANT
PACK CYSTO (CUSTOM PROCEDURE TRAY) ×4 IMPLANT
PLUG CATH AND CAP STER (CATHETERS) ×2 IMPLANT
SYR 30ML LL (SYRINGE) IMPLANT
SYRINGE IRR TOOMEY STRL 70CC (SYRINGE) ×4 IMPLANT
TUBING CONNECTING 10 (TUBING) ×3 IMPLANT
TUBING CONNECTING 10' (TUBING) ×1

## 2014-12-05 NOTE — Anesthesia Postprocedure Evaluation (Signed)
Anesthesia Post Note  Patient: Darren Carr  Procedure(s) Performed: Procedure(s) (LRB): TRANSURETHRAL RESECTION OF BLADDER TUMOR WITH GYRUS (TURBT-GYRUS) (N/A) CYSTOSCOPY WITH BILATERAL RETROGRADE PYELOGRAM (Bilateral)  Anesthesia type: general  Patient location: PACU  Post pain: Pain level controlled  Post assessment: Patient's Cardiovascular Status Stable  Last Vitals:  Filed Vitals:   12/05/14 1453  BP: 160/97  Pulse: 74  Temp: 36.7 C  Resp: 16    Post vital signs: Reviewed and stable  Level of consciousness: sedated  Complications: No apparent anesthesia complications

## 2014-12-05 NOTE — Anesthesia Preprocedure Evaluation (Signed)
Anesthesia Evaluation  Patient identified by MRN, date of birth, ID band Patient awake    Reviewed: Allergy & Precautions, NPO status   Airway Mallampati: I  TM Distance: >3 FB Neck ROM: Full    Dental   Pulmonary Current Smoker,          Cardiovascular hypertension, Pt. on medications     Neuro/Psych    GI/Hepatic   Endo/Other    Renal/GU      Musculoskeletal   Abdominal   Peds  Hematology   Anesthesia Other Findings   Reproductive/Obstetrics                             Anesthesia Physical Anesthesia Plan  ASA: II  Anesthesia Plan: General   Post-op Pain Management:    Induction: Intravenous  Airway Management Planned: LMA  Additional Equipment:   Intra-op Plan:   Post-operative Plan: Extubation in OR  Informed Consent: I have reviewed the patients History and Physical, chart, labs and discussed the procedure including the risks, benefits and alternatives for the proposed anesthesia with the patient or authorized representative who has indicated his/her understanding and acceptance.     Plan Discussed with: CRNA and Surgeon  Anesthesia Plan Comments:         Anesthesia Quick Evaluation

## 2014-12-05 NOTE — H&P (Signed)
Darren Carr is an 65 y.o. male.    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 Extensive laminia propria involvment but no definitive muscularis propria. Clinical stage 3 as ipsilateral hydro. CT 2015 w/o any obvious locally advanced or distant disease. He continues to have on / off gross hematuria with occasional clots.   2 - Right Hydronephrosis - mod hydro to UVJ by staging CT 2015 on eval bladder cancer. Mass at [place].  PMH sig for GERD, HTN, no prior surgeries. No CV disease. Extensive smoker (50PY) but no limitations. His PCP is Particia Nearing, NP in [place].   Today "Ulice Dash" is seen to proceed with restaging TURBT for final planning prior to likely cystoprostatectomy in several mos. NO interval fevers.    Past Medical History  Diagnosis Date  . Alcoholic   . Chronic back pain   . Upper GI bleed   . Bladder tumor 07-2014  . Hypertension     Past Surgical History  Procedure Laterality Date  . Appendectomy    . Esophagogastroduodenoscopy (egd) with propofol N/A 08/16/2013    Procedure: ESOPHAGOGASTRODUODENOSCOPY (EGD) WITH PROPOFOL Hiatus at 40cm Gastroesophageal Junction at 37cm;  Surgeon: Rogene Houston, MD;  Location: [facility];  Service: Endoscopy;  Laterality: N/A;  . Balloon dilation N/A 08/16/2013    Procedure: BALLOON DILATION to 16.41mm;  Surgeon: Rogene Houston, MD;  Location: [facility];  Service: Endoscopy;  Laterality: N/A;  . Esophageal biopsy  08/16/2013    Procedure: DISTAL ESOPHAGEAL BIOPSIES;  Surgeon: Rogene Houston, MD;  Location: [facility];  Service: Endoscopy;;  . Transurethral resection of prostate  07-2014,08-2014,09-2014    History reviewed. No pertinent family history. Social History:  reports that he has been smoking Cigarettes.  He has a 66 pack-year smoking history. He has never used smokeless tobacco. He reports that he drinks alcohol. He reports  that he does not use illicit drugs.  Allergies: No Known Allergies  No prescriptions prior to admission    No results found for this or any previous visit (from the past 48 hour(s)). Dg Chest 2 View  12/04/2014   CLINICAL DATA:  Malignant neoplasm of the bladder.  EXAM: CHEST  2 VIEW  COMPARISON:  04/07/2007  FINDINGS: Heart size and pulmonary vascularity are normal and the lungs are clear. Slight accentuation of the thoracic kyphosis. No acute osseous abnormality. Old fracture of the right eighth rib posteriorly.  IMPRESSION: No active cardiopulmonary disease.   Electronically Signed   By: Lorriane Shire M.D.   On: 12/04/2014 08:13    Review of Systems  Constitutional: Negative.   HENT: Negative.   Eyes: Negative.   Respiratory: Positive for cough and sputum production.        Baseline smoker's cough  Cardiovascular: Negative.   Gastrointestinal: Negative.   Genitourinary: Negative.   Musculoskeletal: Negative.   Skin: Negative.   Neurological: Negative.   Endo/Heme/Allergies: Negative.   Psychiatric/Behavioral: Negative.     There were no vitals taken for this visit. Physical Exam  Constitutional: He appears well-developed.  HENT:  Head: Normocephalic.  Eyes: Pupils are equal, round, and reactive to light.  Neck: Normal range of motion.  Cardiovascular: Normal rate.   Respiratory: Effort normal.  GI: Soft.  Genitourinary: Penis normal.  No CVAT  Musculoskeletal: Normal range of motion.  Neurological: He is alert.  Skin: Skin is warm.  Psychiatric: He has a normal mood and affect. His behavior is normal. Judgment and thought content normal.     Assessment/Plan   1 - High Grade Bladder Cancer - Agree likely higher stage than T1 as ipsilateral hydro and, also with rapid recurrence. Discussed options of repeat endoscopic treatment  and / or proceed with cystoprostatectomy. Goal of former maybe to prevent cystectomy, but certainly oncologically inferior as has clinical  stage 3 disease based on ipsilateral hydro alone. He is motivated for aggressive therapy and wants to proceed with cystoprostatectomy and ileal conduit. Will arrange. Will perform restaging with CT, CXR, CMP and cysto bladder BX prior. Do not favor neoadjuvant chemo as still with hematuria and hydro, would consider adjuvant depending on path.   We rediscussed operative biopsy / transurethral resection as the best next step for diagnostic and therapeutic purposes with goals being to remove all visible cancer and obtain tissue for pathologic exam. We rediscussed that for some low-grade tumors, this may be all the treatment required, but that for many other tumors such as high-grade lesions, further therapy including surgery and or chemotherapy may be warranted. We also reoutlined the fact that any bladder cancer diagnosis will require close follow-up with periodic upper and lower tract evaluation. We rediscussed risks including bleeding, infection, damage to kidney / ureter / bladder including bladder perforation which can typically managed with prolonged foley catheterization. We rementioned anesthetic and other rare risks including DVT, PE, MI, and mortality. I also mentioned that adjunctive procedures such as ureteral stenting, retrograde pyelography, and ureteroscopy may be necessary to fully evaluate the urinary tract depending on intra-operative findings. After answering all questions to the patient's satisfaction, they wish to proceed today as planned.    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 Without cystectomy this will likely remain problematic requiring periodic stenting / stent changes.   Yunis Voorheis 12/05/2014, 6:21 AM

## 2014-12-05 NOTE — Discharge Instructions (Signed)
1 - You may have urinary urgency (bladder spasms) and bloody urine on / off x few days. This is normal. ° °2 - Call MD or go to ER for fever >102, severe pain / nausea / vomiting not relieved by medications, or acute change in medical status ° °

## 2014-12-05 NOTE — Transfer of Care (Signed)
Immediate Anesthesia Transfer of Care Note  Patient: Darren Carr  Procedure(s) Performed: Procedure(s): TRANSURETHRAL RESECTION OF BLADDER TUMOR WITH GYRUS (TURBT-GYRUS) (N/A) CYSTOSCOPY WITH BILATERAL RETROGRADE PYELOGRAM (Bilateral)  Patient Location: PACU  Anesthesia Type:General  Level of Consciousness: awake  Airway & Oxygen Therapy: Patient Spontanous Breathing and Patient connected to face mask oxygen  Post-op Assessment: Report given to RN  Post vital signs: Reviewed and stable  Last Vitals:  Filed Vitals:   12/05/14 0923  BP: 152/98  Pulse: 80  Temp: 36.8 C  Resp: 16    Complications: No apparent anesthesia complications

## 2014-12-05 NOTE — Brief Op Note (Signed)
12/05/2014  1:24 PM  PATIENT:  Darren Carr  64 y.o. male  PRE-OPERATIVE DIAGNOSIS:  BLADDER CANCER, RIGHT HYDRONEPHROSIS  POST-OPERATIVE DIAGNOSIS:  BLADDER CANCER, RIGHT HYDRONEPHROSIS  PROCEDURE:  Procedure(s): TRANSURETHRAL RESECTION OF BLADDER TUMOR WITH GYRUS (TURBT-GYRUS) (N/A) CYSTOSCOPY WITH BILATERAL RETROGRADE PYELOGRAM (Bilateral)  SURGEON:  Surgeon(s) and Role:    * [name], MD - Primary  PHYSICIAN ASSISTANT:   ASSISTANTS: none   ANESTHESIA:   general  EBL:     BLOOD ADMINISTERED:none  DRAINS: none   LOCAL MEDICATIONS USED:  NONE  SPECIMEN:  Source of Specimen:  1 - old resection site, 2 - base of old resection site, 3 - prostatic uretrha  DISPOSITION OF SPECIMEN:  PATHOLOGY  COUNTS:  YES  TOURNIQUET:  * No tourniquets in log *  DICTATION: .Other Dictation: Dictation Number  [id number]  PLAN OF CARE: Discharge to home after PACU  PATIENT DISPOSITION:  PACU - hemodynamically stable.   Delay start of Pharmacological VTE agent (>24hrs) due to surgical blood loss or risk of bleeding: not applicable

## 2014-12-06 ENCOUNTER — Encounter (HOSPITAL_COMMUNITY): Payer: Self-pay | Admitting: Urology

## 2014-12-06 NOTE — Op Note (Signed)
[name], [name]                 ACCOUNT NO.:  [id number]  MEDICAL RECORD NO.:  [id number]  LOCATION:  [facility]                         FACILITY:  [facility]  PHYSICIAN:  [name], MD     DATE OF BIRTH:  1951-08-26  DATE OF PROCEDURE:  12/05/2014                               OPERATIVE REPORT   DIAGNOSIS:  High-grade recurrent bladder cancer.  PROCEDURES: 1. Transurethral resection of bladder tumor, volume medium. 2. Bilateral pyelograms interpretation.  ESTIMATED BLOOD LOSS:  Nil.  COMPLICATIONS:  None.  SPECIMENS: 1. Older resection site. 2. Base of old resection site. 3. Prostatic urethral biopsy.  FINDINGS: 1. Residual versus recurrent bladder tumor on the right wall,     completely obliterating the right ureteral orifice, high suspicion     for tracking of tumor along the distal ureter based on     visualization and partial right retrograde pyelogram. Total surface area resected 4cm2. 2. Unremarkable left retrograde pyelogram. 3. Small posterior bladder diverticulum.  INDICATIONS:  Darren Carr is a pleasant 64 year old Carr with history of gross hematuria.  He was found on workup of this to have a high-grade bladder cancer by Dr. Exie Parody in [place].  He underwent transurethral resection times several including administration of topical chemotherapy, however, his tumor was rapidly recurrent and persistently high grade.  He also had right hydronephrosis, worrisome for possible invasion or obstruction of his right ureter as well.  He was referred for consideration of cystectomy.  The patient has clinically localized disease.  No obvious metastasis on most recent imaging.  We discussed the possibility of cystectomy and agreed that given his right hydronephrosis and recurrent high-grade tumor, that this would be warranted.  The patient and myself included felt that repeat endoscopic examination was warranted to help confirm the truly recurrent high-grade tumor and to  better assess his right ureter with goal of sling and cystectomy could be avoided.  Informed consent was obtained and placed in the medical record.  PROCEDURE IN DETAIL:  The patient being Darren Carr verified, procedure being transurethral resection of bladder tumor was confirmed.  Procedure was carried out.  Time-out was performed.  Intravenous antibiotics were administered.  General LMA anesthesia was induced.  The patient was placed into a low lithotomy position.  Sterile field was created by prepping and draping his penis, perineum, proximal thighs using iodine x3.  Next, cystourethroscopy was performed using a 22-French rigid cystoscope with 30-degree offset lens.  Inspection of anterior and posterior urethra were unremarkable.  Inspection of the bladder revealed an old resection site on the right wall with complete obliteration of the right ureteral orifice.  There was some papillary tumor in this location.  There was a small wide-mouth posterior diverticulum that was only about 2 to 3 cm in depth.  The left ureteral orifice was unremarkable.  Attention was directed at retrograde pyelography.  The left ureteral orifice was cannulated with 6-French end-hole catheter and left retrograde pyelogram was obtained.  Left retrograde pyelogram demonstrated a single left ureter, single system left kidney.  No filling defects or narrowing noted.  Probing of the right ureteral orifice was performed in an area consistent with possible orifices  found.  Retrograde pyelography was obtained.  Right retrograde pyelogram revealed multi-focal filling defects in the intramural bladder and distal ureter without filling defects proximal to this.  This was again felt that this is most likely invasion of the distal ureter by the bladder cancer.  This tissue was quite friable and vascular in this location and given that the patient was planning a cystectomy, it was felt that stenting at this time would  not be warranted as it may likely worsen hematuria.  Attention was then directed at resection of the visible tumor.  The cystoscope was exchanged for 26-French continuous flow resectoscope sheath and using a large resectoscope loop and bipolar energy.  Resection was performed down to what appeared to be the seromuscular fibers of the bladder, resecting all visible tumor, setting aside these fragments, labeled older resection site.  At the area of the presumed right ureteral orifice and right ureteral hiatus, there was still visible papillary tumor present, again consistent with likely involvement of the intramural ureter.  This could not be resected endoscopically. Additional cold cup biopsy forceps were obtained of the base of this area, set aside labeled base of old resection site and again of the prostatic urethra labeled prostatic urethral biopsy.  The base of these areas was coagulated with coagulation current.  Final inspection revealed complete resolution of all visible tumor except for suspected residual disease along the right intramural ureter.  There was excellent hemostasis.  No evidence of bladder perforation.  The bladder was emptied to approximately 100 mL volume and the procedure was terminated. The patient tolerated the procedure well.  There were no immediate periprocedural complications.  The patient was taken to the postanesthesia care unit in stable condition.          ______________________________ [name], MD     TM/MEDQ  D:  12/05/2014  T:  12/06/2014  Job:  [id number]

## 2015-01-11 NOTE — Anesthesia Preprocedure Evaluation (Addendum)
Anesthesia Evaluation  Patient identified by MRN, date of birth, ID band Patient awake    Reviewed: Allergy & Precautions, NPO status , Patient's Chart, lab work & pertinent test results  Airway Mallampati: II   Neck ROM: Full    Dental  (+) Edentulous Upper, Dental Advisory Given   Pulmonary COPD COPD inhaler, Current Smoker (65 pack year),  breath sounds clear to auscultation        Cardiovascular hypertension, Pt. on medications Rhythm:Regular  EKG 11/2014 WNL   Neuro/Psych    GI/Hepatic GERD-  Medicated,  Endo/Other    Renal/GU Renal InsufficiencyRenal diseaseCreat 1.3     Musculoskeletal   Abdominal (+)  Abdomen: soft.    Peds  Hematology   Anesthesia Other Findings   Reproductive/Obstetrics                          Anesthesia Physical Anesthesia Plan  ASA: III  Anesthesia Plan: General   Post-op Pain Management:    Induction: Intravenous  Airway Management Planned: Oral ETT  Additional Equipment: Arterial line  Intra-op Plan:   Post-operative Plan:   Informed Consent: I have reviewed the patients History and Physical, chart, labs and discussed the procedure including the risks, benefits and alternatives for the proposed anesthesia with the patient or authorized representative who has indicated his/her understanding and acceptance.     Plan Discussed with:   Anesthesia Plan Comments: (2nd IV after induction, multimodal pain RX, arterial line for metabolic evaluations and better BP monitoring)       Anesthesia Quick Evaluation

## 2015-01-15 ENCOUNTER — Inpatient Hospital Stay (HOSPITAL_COMMUNITY)
Admission: RE | Admit: 2015-01-15 | Discharge: 2015-01-22 | DRG: 654 | Disposition: A | Discharge status: Home Health | Payer: BLUE CROSS/BLUE SHIELD | Source: Ambulatory Visit | Attending: Urology | Admitting: Urology

## 2015-01-15 ENCOUNTER — Encounter (HOSPITAL_COMMUNITY): Payer: Self-pay

## 2015-01-15 DIAGNOSIS — I10 Essential (primary) hypertension: Secondary | ICD-10-CM | POA: Diagnosis present

## 2015-01-15 DIAGNOSIS — K913 Postprocedural intestinal obstruction: Secondary | ICD-10-CM | POA: Diagnosis not present

## 2015-01-15 DIAGNOSIS — M549 Dorsalgia, unspecified: Secondary | ICD-10-CM | POA: Diagnosis present

## 2015-01-15 DIAGNOSIS — Z79891 Long term (current) use of opiate analgesic: Secondary | ICD-10-CM

## 2015-01-15 DIAGNOSIS — C679 Malignant neoplasm of bladder, unspecified: Principal | ICD-10-CM | POA: Diagnosis present

## 2015-01-15 DIAGNOSIS — G8929 Other chronic pain: Secondary | ICD-10-CM | POA: Diagnosis present

## 2015-01-15 DIAGNOSIS — F17210 Nicotine dependence, cigarettes, uncomplicated: Secondary | ICD-10-CM | POA: Diagnosis present

## 2015-01-15 DIAGNOSIS — K219 Gastro-esophageal reflux disease without esophagitis: Secondary | ICD-10-CM | POA: Diagnosis present

## 2015-01-15 DIAGNOSIS — N1339 Other hydronephrosis: Secondary | ICD-10-CM | POA: Diagnosis present

## 2015-01-15 DIAGNOSIS — Z79899 Other long term (current) drug therapy: Secondary | ICD-10-CM

## 2015-01-15 DIAGNOSIS — C775 Secondary and unspecified malignant neoplasm of intrapelvic lymph nodes: Secondary | ICD-10-CM | POA: Diagnosis present

## 2015-01-15 LAB — PREPARE RBC (CROSSMATCH)

## 2015-01-15 LAB — SURGICAL PCR SCREEN
MRSA, PCR: NEGATIVE
Staphylococcus aureus: NEGATIVE

## 2015-01-15 LAB — ABO/RH: ABO/RH(D): O POS

## 2015-01-15 MED ORDER — KCL IN DEXTROSE-NACL 20-5-0.45 MEQ/L-%-% IV SOLN
INTRAVENOUS | Status: DC
  Administered 2015-01-15 – 2015-01-17 (×2): 50 mL/h via INTRAVENOUS
  Filled 2015-01-15 (×4): qty 1000

## 2015-01-15 MED ORDER — PEG 3350-KCL-NA BICARB-NACL 420 G PO SOLR
4000.0000 mL | Freq: Once | ORAL | Status: AC
  Administered 2015-01-15: 4000 mL via ORAL

## 2015-01-15 MED ORDER — PIPERACILLIN-TAZOBACTAM 3.375 G IVPB 30 MIN
3.3750 g | INTRAVENOUS | Status: AC
  Administered 2015-01-16 (×2): 3.375 g via INTRAVENOUS
  Filled 2015-01-15 (×2): qty 50

## 2015-01-15 NOTE — H&P (Signed)
Darren Carr is an 64 y.o. male.    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 TURBT / exam under anesthesia in [place] 11/2014 confirms recurrent / large volume likely T3 high-grade bladder cancer with complete oblitteration of Rt UO by visible papillary cancer that is unresectable endoscopically. Restaging CT 11/2014 w/o advanced or distant disease.   He is scheduled for robotic cystoprostatectomy with ileal conduit 01/16/2015.   2 - Right Hydronephrosis - mod hydro to UVJ by staging CT 2015 on eval bladder cancer. Confirmed malignant obstruction. Most recent Cr 1.27.   PMH sig for GERD, HTN, no prior surgeries. No CV disease. Extensive smoker (50PY) but no limitations. His PCP is Particia Nearing, NP in [place].   Today "Ulice Dash" is seen as pre-op admission for labs, stomal marking, and bowel prep prior to cystectomy tomorrow.  Past Medical History  Diagnosis Date  . Alcoholic   . Chronic back pain   . Upper GI bleed   . Bladder tumor 07-2014  . Hypertension     Past Surgical History  Procedure Laterality Date  . Appendectomy    . Esophagogastroduodenoscopy (egd) with propofol N/A 08/16/2013    Procedure: ESOPHAGOGASTRODUODENOSCOPY (EGD) WITH PROPOFOL Hiatus at 40cm Gastroesophageal Junction at 37cm;  Surgeon: Rogene Houston, MD;  Location: [facility];  Service: Endoscopy;  Laterality: N/A;  . Balloon dilation N/A 08/16/2013    Procedure: BALLOON DILATION to 16.62mm;  Surgeon: Rogene Houston, MD;  Location: [facility];  Service: Endoscopy;  Laterality: N/A;  . Esophageal biopsy  08/16/2013    Procedure: DISTAL ESOPHAGEAL BIOPSIES;  Surgeon: Rogene Houston, MD;  Location: [facility];  Service: Endoscopy;;  . Transurethral resection of prostate  07-2014,08-2014,09-2014  . Transurethral resection of bladder tumor with gyrus (turbt-gyrus) N/A 12/05/2014    Procedure: TRANSURETHRAL RESECTION OF BLADDER TUMOR WITH GYRUS  (TURBT-GYRUS);  Surgeon: [name], MD;  Location: [facility];  Service: Urology;  Laterality: N/A;  . Cystoscopy w/ ureteral stent placement Bilateral 12/05/2014    Procedure: CYSTOSCOPY WITH BILATERAL RETROGRADE PYELOGRAM;  Surgeon: [name], MD;  Location: [facility];  Service: Urology;  Laterality: Bilateral;    History reviewed. No pertinent family history. Social History:  reports that he has been smoking Cigarettes.  He has a 66 pack-year smoking history. He has never used smokeless tobacco. He reports that he drinks alcohol. He reports that he does not use illicit drugs.  Allergies: No Known Allergies  Medications Prior to Admission  Medication Sig Dispense Refill  . lisinopril (PRINIVIL,ZESTRIL) 5 MG tablet Take 5 mg by mouth daily.    Marland Kitchen omeprazole (PRILOSEC) 20 MG capsule Take 20 mg by mouth daily as needed (Heartburn).     . Oxycodone HCl 10 MG TABS Take 1-2 tablets (10-20 mg total) by mouth every 4 (four) hours as needed (Pain). Post-operatively 15 tablet 0    No results found for this or any previous visit (from the past 48 hour(s)). No results found.  Review of Systems  Constitutional: Negative.   HENT: Negative.   Eyes: Negative.   Respiratory: Negative.   Cardiovascular: Negative.   Gastrointestinal: Negative.   Genitourinary: Positive for hematuria.  Musculoskeletal: Negative.   Skin: Negative.   Neurological: Negative.   Endo/Heme/Allergies: Negative.   Psychiatric/Behavioral: Negative.     Blood pressure 137/94, pulse 78, temperature 98.2 F (36.8 C), temperature source Oral, resp. rate 18,  height 5\' 10"  (1.778 m), weight 73.12 kg (161 lb 3.2 oz), SpO2 98 %. Physical Exam  Constitutional: He appears well-developed.  HENT:  Head: Normocephalic.  Eyes: Pupils are equal, round, and reactive to light.  Neck: Normal range of motion.  Cardiovascular: Normal rate.   Respiratory: Effort normal.  GI: Soft.  Genitourinary: Penis normal.  NO CVAT   Musculoskeletal: Normal range of motion.  Neurological: He is alert.  Skin: Skin is warm.  Psychiatric: He has a normal mood and affect. His behavior is normal. Judgment and thought content normal.     Assessment/Plan   1 - High Grade Bladder Cancer - cystectomy clearly indicated as large volume high grade, likely T3 w/o metastatic disease. Do not favor neo-adjuvant chemo as malignant hydro.  We rediscussed the role of radical cystectomy + lymph node dissection with concomitant prostatectomy in male and hysterectomy / oophorectomy in male and ileal conduit urinary diversion with the overall goal of complete surgical excision (negative margins) and better staging / diagnosis. We specifically rediscussed alternatives including chemo-radiation, palliative therapies, and the role of neoadjuvant chemotherapy. We then rediscussed surgical approaches including robotic and open techniques with robotic associated with a shorter convalescence. I showed the patient on their abdomen the approximately 4-6 incision (trocar) sites as well as presumed extraction sites with robotic approach as well as possible open incision sites. I also showed them potential sites for the ileal conduit and spent significant time explaining the "plumbing" of this with regards to GI and GU tracts and specific risks of diversion including ureteral stricture. We specifically readdressed that there may be need to alter operative plans according to intraopertive findings including conversion to open procedure. We rediscussed specific peri-operative risks including bleeding, infection, deep vein thrombosis, pulmonary embolism, compartment syndrome, nuropathy / neuropraxia, bowel leak, bowel stricture, heart attack, stroke, death, as well as long-term risks such as non-cure / need for additional therapy and need for imaging and lab based post-op surveillance protocols. We rediscussed typical hospital course of approximately 5-7 day  hospitalization, need for peri-operative drains / catheters, and typical post-hospital course with return to most non-strenuous activities by 4 weeks and ability to return to most jobs and more strenuous activity such as exercise by 8 weeks but with complete return to baseline often taking 59mos plus.   After this lengthy and detail discussion, including answering all of the patient's questions to their satisfaction, they have chosen to proceed with cystectomy 01/16/15, tomorrow as planned.  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 Without cystectomy this will likely remain problematic requiring periodic stenting / stent changes.  Ameisha Mcclellan 01/15/2015, 3:22 PM

## 2015-01-15 NOTE — Consult Note (Signed)
WOC consult requested for stoma site marking for ileal conduit tomorrow.  Assessed abd while standing and sitting.  Mark placed within rectus muscles, in line of vision, to area free from folds.  Elta Guadeloupe is placed to the right upper quadrant, 7 cm to the right of the umbilicus and 3 cm above.  There are several creases which occur when the patient leans forward which are located in the RLQ which should be avoided if possible.  Educational materials left at bedside and briefly discussed pouching routines and demonstrated pouch appearance.  Pt asks appropriate questions.  [facility] team will plan to follow post-op for teaching sessions. Julien Girt MSN, RN, [place], [place], [place]

## 2015-01-16 ENCOUNTER — Encounter (HOSPITAL_COMMUNITY): Payer: Self-pay | Admitting: Certified Registered Nurse Anesthetist

## 2015-01-16 ENCOUNTER — Inpatient Hospital Stay (HOSPITAL_COMMUNITY): Payer: BLUE CROSS/BLUE SHIELD | Admitting: Anesthesiology

## 2015-01-16 ENCOUNTER — Encounter (HOSPITAL_COMMUNITY)
Admission: RE | Disposition: A | Discharge status: Home Health | Payer: Self-pay | Source: Ambulatory Visit | Attending: Urology

## 2015-01-16 DIAGNOSIS — C679 Malignant neoplasm of bladder, unspecified: Secondary | ICD-10-CM | POA: Diagnosis present

## 2015-01-16 HISTORY — PX: ROBOT ASSISTED LAPAROSCOPIC COMPLETE CYSTECT ILEAL CONDUIT: SHX5139

## 2015-01-16 HISTORY — PX: CYSTOSCOPY WITH INJECTION: SHX1424

## 2015-01-16 HISTORY — PX: LYMPHADENECTOMY: SHX5960

## 2015-01-16 LAB — BASIC METABOLIC PANEL
Anion gap: 8 (ref 5–15)
BUN: 10 mg/dL (ref 6–20)
CALCIUM: 7.8 mg/dL — AB (ref 8.9–10.3)
CO2: 20 mmol/L — ABNORMAL LOW (ref 22–32)
CREATININE: 1.17 mg/dL (ref 0.61–1.24)
Chloride: 105 mmol/L (ref 101–111)
Glucose, Bld: 187 mg/dL — ABNORMAL HIGH (ref 70–99)
POTASSIUM: 4.5 mmol/L (ref 3.5–5.1)
Sodium: 133 mmol/L — ABNORMAL LOW (ref 135–145)

## 2015-01-16 LAB — HEMOGLOBIN AND HEMATOCRIT, BLOOD
HCT: 40.3 % (ref 39.0–52.0)
Hemoglobin: 13.9 g/dL (ref 13.0–17.0)

## 2015-01-16 SURGERY — ROBOTIC ASSISTED LAPAROSCOPIC COMPLETE CYSTECT ILEAL CONDUIT
Anesthesia: General

## 2015-01-16 MED ORDER — BUPIVACAINE 0.25 % ON-Q PUMP SINGLE CATH 300ML
300.0000 mL | INJECTION | Status: DC
  Filled 2015-01-16: qty 300

## 2015-01-16 MED ORDER — ROCURONIUM BROMIDE 100 MG/10ML IV SOLN
INTRAVENOUS | Status: DC | PRN
  Administered 2015-01-16 (×2): 20 mg via INTRAVENOUS
  Administered 2015-01-16: 10 mg via INTRAVENOUS
  Administered 2015-01-16 (×2): 20 mg via INTRAVENOUS
  Administered 2015-01-16: 10 mg via INTRAVENOUS
  Administered 2015-01-16: 50 mg via INTRAVENOUS

## 2015-01-16 MED ORDER — GLYCOPYRROLATE 0.2 MG/ML IJ SOLN
INTRAMUSCULAR | Status: DC | PRN
  Administered 2015-01-16 (×2): 0.2 mg via INTRAVENOUS
  Administered 2015-01-16: 0.6 mg via INTRAVENOUS

## 2015-01-16 MED ORDER — PROMETHAZINE HCL 25 MG/ML IJ SOLN: 6.2500 mg | INTRAMUSCULAR | Status: DC | PRN

## 2015-01-16 MED ORDER — ACETAMINOPHEN 10 MG/ML IV SOLN
1000.0000 mg | Freq: Once | INTRAVENOUS | Status: AC
  Administered 2015-01-16: 1000 mg via INTRAVENOUS
  Filled 2015-01-16: qty 100

## 2015-01-16 MED ORDER — LIDOCAINE HCL (CARDIAC) 20 MG/ML IV SOLN
INTRAVENOUS | Status: DC | PRN
  Administered 2015-01-16: 100 mg via INTRAVENOUS

## 2015-01-16 MED ORDER — HYDROMORPHONE HCL 1 MG/ML IJ SOLN
0.2500 mg | INTRAMUSCULAR | Status: DC | PRN
  Administered 2015-01-16 (×4): 0.5 mg via INTRAVENOUS

## 2015-01-16 MED ORDER — CETYLPYRIDINIUM CHLORIDE 0.05 % MT LIQD
7.0000 mL | Freq: Two times a day (BID) | OROMUCOSAL | Status: DC
  Administered 2015-01-16 – 2015-01-21 (×10): 7 mL via OROMUCOSAL

## 2015-01-16 MED ORDER — LIDOCAINE HCL (CARDIAC) 20 MG/ML IV SOLN
INTRAVENOUS | Status: AC
  Filled 2015-01-16: qty 5

## 2015-01-16 MED ORDER — PROPOFOL 10 MG/ML IV BOLUS
INTRAVENOUS | Status: DC | PRN
  Administered 2015-01-16: 200 mg via INTRAVENOUS

## 2015-01-16 MED ORDER — DEXAMETHASONE SODIUM PHOSPHATE 10 MG/ML IJ SOLN
INTRAMUSCULAR | Status: DC | PRN
  Administered 2015-01-16: 10 mg via INTRAVENOUS

## 2015-01-16 MED ORDER — NEOSTIGMINE METHYLSULFATE 10 MG/10ML IV SOLN
INTRAVENOUS | Status: DC | PRN
  Administered 2015-01-16: 5 mg via INTRAVENOUS

## 2015-01-16 MED ORDER — SODIUM CHLORIDE 0.9 % IJ SOLN
INTRAMUSCULAR | Status: AC
  Filled 2015-01-16: qty 10

## 2015-01-16 MED ORDER — FAMOTIDINE IN NACL 20-0.9 MG/50ML-% IV SOLN
20.0000 mg | INTRAVENOUS | Status: DC
  Administered 2015-01-16 – 2015-01-21 (×6): 20 mg via INTRAVENOUS
  Filled 2015-01-16 (×7): qty 50

## 2015-01-16 MED ORDER — FENTANYL CITRATE (PF) 250 MCG/5ML IJ SOLN
INTRAMUSCULAR | Status: AC
  Filled 2015-01-16: qty 5

## 2015-01-16 MED ORDER — BUPIVACAINE 0.25 % ON-Q PUMP SINGLE CATH 300ML: 300.0000 mL | INJECTION | Status: DC

## 2015-01-16 MED ORDER — SODIUM CHLORIDE 0.9 % IR SOLN
Status: DC | PRN
  Administered 2015-01-16: 3000 mL via INTRAVESICAL

## 2015-01-16 MED ORDER — DEXAMETHASONE SODIUM PHOSPHATE 10 MG/ML IJ SOLN
INTRAMUSCULAR | Status: AC
  Filled 2015-01-16: qty 1

## 2015-01-16 MED ORDER — PIPERACILLIN-TAZOBACTAM 3.375 G IVPB
INTRAVENOUS | Status: AC
  Filled 2015-01-16: qty 50

## 2015-01-16 MED ORDER — LABETALOL HCL 5 MG/ML IV SOLN
INTRAVENOUS | Status: DC | PRN
  Administered 2015-01-16: 5 mg via INTRAVENOUS

## 2015-01-16 MED ORDER — ROCURONIUM BROMIDE 100 MG/10ML IV SOLN
INTRAVENOUS | Status: AC
Start: 2015-01-16 — End: 2015-01-16
  Filled 2015-01-16: qty 1

## 2015-01-16 MED ORDER — HYDRALAZINE HCL 20 MG/ML IJ SOLN
INTRAMUSCULAR | Status: AC
  Filled 2015-01-16: qty 1

## 2015-01-16 MED ORDER — ONDANSETRON HCL 4 MG/2ML IJ SOLN
INTRAMUSCULAR | Status: AC
  Filled 2015-01-16: qty 2

## 2015-01-16 MED ORDER — OXYCODONE HCL 5 MG PO TABS
10.0000 mg | ORAL_TABLET | ORAL | Status: DC | PRN
  Administered 2015-01-17 – 2015-01-20 (×3): 10 mg via ORAL
  Filled 2015-01-16: qty 3
  Filled 2015-01-16 (×2): qty 2

## 2015-01-16 MED ORDER — ACETAMINOPHEN 500 MG PO TABS
1000.0000 mg | ORAL_TABLET | Freq: Four times a day (QID) | ORAL | Status: AC
  Administered 2015-01-16 – 2015-01-17 (×3): 1000 mg via ORAL
  Filled 2015-01-16 (×4): qty 2

## 2015-01-16 MED ORDER — MIDAZOLAM HCL 2 MG/2ML IJ SOLN
INTRAMUSCULAR | Status: AC
  Filled 2015-01-16: qty 2

## 2015-01-16 MED ORDER — LORAZEPAM 2 MG/ML IJ SOLN
0.5000 mg | Freq: Two times a day (BID) | INTRAMUSCULAR | Status: DC
  Administered 2015-01-16 – 2015-01-21 (×3): 0.5 mg via INTRAVENOUS
  Filled 2015-01-16 (×3): qty 1

## 2015-01-16 MED ORDER — ONDANSETRON HCL 4 MG/2ML IJ SOLN
INTRAMUSCULAR | Status: DC | PRN
  Administered 2015-01-16: 4 mg via INTRAVENOUS

## 2015-01-16 MED ORDER — ALVIMOPAN 12 MG PO CAPS
12.0000 mg | ORAL_CAPSULE | Freq: Once | ORAL | Status: AC
  Administered 2015-01-16: 12 mg via ORAL
  Filled 2015-01-16: qty 1

## 2015-01-16 MED ORDER — GLYCOPYRROLATE 0.2 MG/ML IJ SOLN
INTRAMUSCULAR | Status: AC
  Filled 2015-01-16: qty 2

## 2015-01-16 MED ORDER — DEXMEDETOMIDINE HCL IN NACL 200 MCG/50ML IV SOLN
0.4000 ug/kg/h | INTRAVENOUS | Status: AC
  Administered 2015-01-16: 10 ug via INTRAVENOUS
  Administered 2015-01-16: 20 ug via INTRAVENOUS
  Administered 2015-01-16 (×6): 10 ug via INTRAVENOUS
  Filled 2015-01-16: qty 50

## 2015-01-16 MED ORDER — KETAMINE HCL 10 MG/ML IJ SOLN
INTRAMUSCULAR | Status: AC
  Filled 2015-01-16: qty 1

## 2015-01-16 MED ORDER — HYDRALAZINE HCL 20 MG/ML IJ SOLN
INTRAMUSCULAR | Status: DC | PRN
  Administered 2015-01-16: 5 mg via INTRAVENOUS

## 2015-01-16 MED ORDER — LABETALOL HCL 5 MG/ML IV SOLN
INTRAVENOUS | Status: AC
  Filled 2015-01-16: qty 4

## 2015-01-16 MED ORDER — HYDROMORPHONE HCL 1 MG/ML IJ SOLN
INTRAMUSCULAR | Status: AC
  Filled 2015-01-16: qty 1

## 2015-01-16 MED ORDER — FENTANYL CITRATE (PF) 100 MCG/2ML IJ SOLN
INTRAMUSCULAR | Status: DC | PRN
  Administered 2015-01-16 (×5): 50 ug via INTRAVENOUS

## 2015-01-16 MED ORDER — OXYCODONE HCL 10 MG PO TABS: 10.0000 mg | ORAL_TABLET | ORAL | Status: DC | PRN

## 2015-01-16 MED ORDER — MEPERIDINE HCL 50 MG/ML IJ SOLN: 6.2500 mg | INTRAMUSCULAR | Status: DC | PRN

## 2015-01-16 MED ORDER — GLYCOPYRROLATE 0.2 MG/ML IJ SOLN
INTRAMUSCULAR | Status: AC
  Filled 2015-01-16: qty 3

## 2015-01-16 MED ORDER — KETAMINE HCL 10 MG/ML IJ SOLN
INTRAMUSCULAR | Status: DC | PRN
  Administered 2015-01-16: 10 mg via INTRAVENOUS
  Administered 2015-01-16: 30 mg via INTRAVENOUS
  Administered 2015-01-16: 10 mg via INTRAVENOUS

## 2015-01-16 MED ORDER — LACTATED RINGERS IV SOLN
INTRAVENOUS | Status: DC | PRN
  Administered 2015-01-16 (×2): via INTRAVENOUS

## 2015-01-16 MED ORDER — ROCURONIUM BROMIDE 100 MG/10ML IV SOLN
INTRAVENOUS | Status: AC
  Filled 2015-01-16: qty 1

## 2015-01-16 MED ORDER — PROPOFOL 10 MG/ML IV BOLUS
INTRAVENOUS | Status: AC
Start: 2015-01-16 — End: 2015-01-16
  Filled 2015-01-16: qty 20

## 2015-01-16 MED ORDER — LACTATED RINGERS IV SOLN
INTRAVENOUS | Status: DC | PRN
Start: 2015-01-16 — End: 2015-01-16
  Administered 2015-01-16 (×2): via INTRAVENOUS

## 2015-01-16 MED ORDER — LACTATED RINGERS IR SOLN
Status: DC | PRN
  Administered 2015-01-16: 1000 mL

## 2015-01-16 MED ORDER — BUPIVACAINE ON-Q PAIN PUMP (FOR ORDER SET NO CHG)
INJECTION | Status: DC
  Filled 2015-01-16: qty 1

## 2015-01-16 MED ORDER — HYDROMORPHONE HCL 1 MG/ML IJ SOLN
0.5000 mg | INTRAMUSCULAR | Status: DC | PRN
  Administered 2015-01-16 – 2015-01-21 (×39): 1 mg via INTRAVENOUS
  Filled 2015-01-16 (×39): qty 1

## 2015-01-16 MED ORDER — EPHEDRINE SULFATE 50 MG/ML IJ SOLN
INTRAMUSCULAR | Status: AC
  Filled 2015-01-16: qty 1

## 2015-01-16 MED ORDER — ONDANSETRON HCL 4 MG/2ML IJ SOLN: 4.0000 mg | INTRAMUSCULAR | Status: DC | PRN

## 2015-01-16 MED ORDER — LISINOPRIL 10 MG PO TABS
5.0000 mg | ORAL_TABLET | Freq: Every day | ORAL | Status: DC
  Administered 2015-01-16 – 2015-01-22 (×7): 5 mg via ORAL
  Filled 2015-01-16: qty 2
  Filled 2015-01-16: qty 1
  Filled 2015-01-16: qty 2
  Filled 2015-01-16 (×4): qty 1

## 2015-01-16 MED ORDER — MIDAZOLAM HCL 5 MG/5ML IJ SOLN
INTRAMUSCULAR | Status: DC | PRN
  Administered 2015-01-16: 2 mg via INTRAVENOUS

## 2015-01-16 MED ORDER — ACETAMINOPHEN 10 MG/ML IV SOLN
1000.0000 mg | INTRAVENOUS | Status: AC
  Administered 2015-01-16: 1000 mg via INTRAVENOUS
  Filled 2015-01-16: qty 100

## 2015-01-16 MED ORDER — DEXTROSE-NACL 5-0.45 % IV SOLN
INTRAVENOUS | Status: DC
  Administered 2015-01-16: 17:00:00 via INTRAVENOUS
  Administered 2015-01-17: 1000 mL via INTRAVENOUS
  Administered 2015-01-17 – 2015-01-19 (×4): via INTRAVENOUS

## 2015-01-16 SURGICAL SUPPLY — 87 items
APL ESCP 34 STRL LF DISP (HEMOSTASIS)
APL SKNCLS STERI-STRIP NONHPOA (GAUZE/BANDAGES/DRESSINGS) ×2
APPLICATOR SURGIFLO ENDO (HEMOSTASIS) IMPLANT
BAG URINE DRAINAGE (UROLOGICAL SUPPLIES) ×2 IMPLANT
BAG URO CATCHER STRL LF (DRAPE) ×4 IMPLANT
BENZOIN TINCTURE PRP APPL 2/3 (GAUZE/BANDAGES/DRESSINGS) ×2 IMPLANT
BLADE SURG SZ10 CARB STEEL (BLADE) IMPLANT
CABLE HIGH FREQUENCY MONO STRZ (ELECTRODE) ×2 IMPLANT
CANNULA SEAL DVNC (CANNULA) IMPLANT
CANNULA SEALS DA VINCI (CANNULA) ×8
CATH FOLEY 2WAY SLVR 18FR 30CC (CATHETERS) ×4 IMPLANT
CATH KIT ON-Q SILVERSOAK 5 (CATHETERS) IMPLANT
CATH KIT ON-Q SILVERSOAK 5IN (CATHETERS) ×4 IMPLANT
CATH TIEMANN FOLEY 18FR 5CC (CATHETERS) ×2 IMPLANT
CHLORAPREP W/TINT 26ML (MISCELLANEOUS) ×4 IMPLANT
CLIP LIGATING HEM O LOK PURPLE (MISCELLANEOUS) ×10 IMPLANT
CLIP LIGATING HEMO LOK XL GOLD (MISCELLANEOUS) ×6 IMPLANT
CLIP LIGATING HEMO O LOK GREEN (MISCELLANEOUS) ×6 IMPLANT
COVER SURGICAL LIGHT HANDLE (MISCELLANEOUS) ×2 IMPLANT
COVER TIP SHEARS 8 DVNC (MISCELLANEOUS) ×2 IMPLANT
COVER TIP SHEARS 8MM DA VINCI (MISCELLANEOUS) ×2
DRAPE ARM DVNC X/XI (DISPOSABLE) IMPLANT
DRAPE COLUMN DVNC XI (DISPOSABLE) IMPLANT
DRAPE DA VINCI XI ARM (DISPOSABLE) ×8
DRAPE DA VINCI XI COLUMN (DISPOSABLE) ×2
DRAPE LAPAROSCOPIC ABDOMINAL (DRAPES) ×2 IMPLANT
DRAPE WARM FLUID 44X44 (DRAPE) IMPLANT
DRSG TEGADERM 6X8 (GAUZE/BANDAGES/DRESSINGS) ×4 IMPLANT
ELECT CAUTERY BLADE 6.4 (BLADE) ×4 IMPLANT
ELECT REM PT RETURN 9FT ADLT (ELECTROSURGICAL) ×4
ELECTRODE REM PT RTRN 9FT ADLT (ELECTROSURGICAL) ×2 IMPLANT
GLOVE BIO SURGEON STRL SZ 6.5 (GLOVE) ×7 IMPLANT
GLOVE BIO SURGEONS STRL SZ 6.5 (GLOVE) ×3
GLOVE BIOGEL M STRL SZ7.5 (GLOVE) ×12 IMPLANT
GOWN STRL REUS W/ TWL XL LVL3 (GOWN DISPOSABLE) IMPLANT
GOWN STRL REUS W/TWL LRG LVL3 (GOWN DISPOSABLE) ×26 IMPLANT
GOWN STRL REUS W/TWL XL LVL3 (GOWN DISPOSABLE) ×8
KIT ACCESSORY DA VINCI DISP (KITS)
KIT ACCESSORY DVNC DISP (KITS) ×2 IMPLANT
KIT PROCEDURE DA VINCI SI (MISCELLANEOUS) ×2
KIT PROCEDURE DVNC SI (MISCELLANEOUS) ×2 IMPLANT
LIQUID BAND (GAUZE/BANDAGES/DRESSINGS) ×8 IMPLANT
LOOP VESSEL MAXI BLUE (MISCELLANEOUS) ×4 IMPLANT
MANIFOLD NEPTUNE II (INSTRUMENTS) ×2 IMPLANT
NDL INSUFFLATION 14GA 120MM (NEEDLE) ×2 IMPLANT
NEEDLE INSUFFLATION 14GA 120MM (NEEDLE) ×4 IMPLANT
PACK CYSTO (CUSTOM PROCEDURE TRAY) ×4 IMPLANT
PACK ROBOT UROLOGY CUSTOM (CUSTOM PROCEDURE TRAY) ×4 IMPLANT
PAD POSITIONING PINK XL (MISCELLANEOUS) ×2 IMPLANT
POSITIONER SURGICAL ARM (MISCELLANEOUS) ×8 IMPLANT
POUCH ENDO CATCH II 15MM (MISCELLANEOUS) ×2 IMPLANT
PUMP PAIN ON-Q (MISCELLANEOUS) ×2 IMPLANT
RELOAD STAPLE 60 2.6 WHT THN (STAPLE) ×4 IMPLANT
RELOAD STAPLE 60 4.1 GRN THCK (STAPLE) ×2 IMPLANT
RELOAD STAPLER GREEN 60MM (STAPLE) ×8 IMPLANT
RELOAD STAPLER WHITE 60MM (STAPLE) ×16 IMPLANT
SET TUBE IRRIG SUCTION NO TIP (IRRIGATION / IRRIGATOR) ×4 IMPLANT
SHEET LAVH (DRAPES) IMPLANT
SOLUTION ELECTROLUBE (MISCELLANEOUS) ×4 IMPLANT
SPONGE LAP 18X18 X RAY DECT (DISPOSABLE) ×8 IMPLANT
SPONGE LAP 4X18 X RAY DECT (DISPOSABLE) ×4 IMPLANT
STAPLE ECHEON FLEX 60 POW ENDO (STAPLE) ×4 IMPLANT
STAPLER RELOAD GREEN 60MM (STAPLE) ×16
STAPLER RELOAD WHITE 60MM (STAPLE) ×32
STENT SET URETHERAL LEFT 7FR (STENTS) ×4 IMPLANT
STENT SET URETHERAL RIGHT 7FR (STENTS) ×4 IMPLANT
SURGIFLO W/THROMBIN 8M KIT (HEMOSTASIS) IMPLANT
SUT CHROMIC 4 0 RB 1X27 (SUTURE) ×4 IMPLANT
SUT ETHILON 3 0 PS 1 (SUTURE) ×4 IMPLANT
SUT MNCRL AB 4-0 PS2 18 (SUTURE) ×10 IMPLANT
SUT PDS AB 0 CTX 36 PDP370T (SUTURE) ×14 IMPLANT
SUT SILK 3 0 SH CR/8 (SUTURE) ×4 IMPLANT
SUT VIC AB 2-0 UR5 27 (SUTURE) ×16 IMPLANT
SUT VIC AB 3-0 SH 27 (SUTURE) ×24
SUT VIC AB 3-0 SH 27X BRD (SUTURE) IMPLANT
SUT VIC AB 3-0 SH 27XBRD (SUTURE) ×2 IMPLANT
SUT VIC AB 4-0 RB1 27 (SUTURE) ×16
SUT VIC AB 4-0 RB1 27XBRD (SUTURE) ×12 IMPLANT
SUT VLOC BARB 180 ABS3/0GR12 (SUTURE) ×4
SUTURE VLOC BRB 180 ABS3/0GR12 (SUTURE) ×2 IMPLANT
SYSTEM UROSTOMY GENTLE TOUCH (WOUND CARE) ×4 IMPLANT
TOWEL OR NON WOVEN STRL DISP B (DISPOSABLE) ×4 IMPLANT
TROCAR 12M 150ML BLUNT (TROCAR) ×2 IMPLANT
TROCAR BLADELESS 15MM (ENDOMECHANICALS) ×4 IMPLANT
WATER STERILE IRR 1000ML UROMA (IV SOLUTION) ×2 IMPLANT
WATER STERILE IRR 1500ML POUR (IV SOLUTION) ×4 IMPLANT
YANKAUER SUCT BULB TIP 10FT TU (MISCELLANEOUS) IMPLANT

## 2015-01-16 NOTE — Anesthesia Postprocedure Evaluation (Signed)
  Anesthesia Post-op Note  Patient: Darren Carr  Procedure(s) Performed: Procedure(s): ROBOTIC ASSISTED LAPAROSCOPIC COMPLETE CYSTECT ILEAL CONDUIT/ROBOTIC ASSISTED LAPAROSCOPIC RADICAL PROSTATECTOMY (N/A) PELVIC LYMPH NODE DISSECTION (Bilateral) CYSTOSCOPY WITH INJECTION OF INDOCYANINE GREEN DYE (N/A)  Patient Location: PACU  Anesthesia Type:General  Level of Consciousness: awake, alert  and oriented  Airway and Oxygen Therapy: Patient Spontanous Breathing and Patient connected to nasal cannula oxygen  Post-op Pain: mild  Post-op Assessment: Post-op Vital signs reviewed, Patient's Cardiovascular Status Stable, Respiratory Function Stable, Patent Airway and No signs of Nausea or vomiting  Post-op Vital Signs: Reviewed and stable  Last Vitals:  Filed Vitals:   01/16/15 1600  BP: 113/69  Pulse: 56  Temp:   Resp: 18    Complications: No apparent anesthesia complications

## 2015-01-16 NOTE — Transfer of Care (Signed)
Immediate Anesthesia Transfer of Care Note  Patient: Darren Carr  Procedure(s) Performed: Procedure(s): ROBOTIC ASSISTED LAPAROSCOPIC COMPLETE CYSTECT ILEAL CONDUIT/ROBOTIC ASSISTED LAPAROSCOPIC RADICAL PROSTATECTOMY (N/A) PELVIC LYMPH NODE DISSECTION (Bilateral) CYSTOSCOPY WITH INJECTION OF INDOCYANINE GREEN DYE (N/A)  Patient Location: PACU  Anesthesia Type:General  Level of Consciousness: awake, sedated and patient cooperative  Airway & Oxygen Therapy: Patient Spontanous Breathing and Patient connected to face mask oxygen  Post-op Assessment: Report given to RN and Post -op Vital signs reviewed and stable  Post vital signs: Reviewed and stable  Last Vitals:  Filed Vitals:   01/16/15 0433  BP: 154/99  Pulse: 56  Temp: 36.6 C  Resp: 15    Complications: No apparent anesthesia complications

## 2015-01-16 NOTE — Progress Notes (Signed)
Precedex 12 mcg given per Dr Tresa Moore,

## 2015-01-16 NOTE — Anesthesia Procedure Notes (Signed)
Procedure Name: Intubation Date/Time: 01/16/2015 8:44 AM Performed by: Maxwell Caul Pre-anesthesia Checklist: Patient identified, Emergency Drugs available, Suction available and Patient being monitored Patient Re-evaluated:Patient Re-evaluated prior to inductionOxygen Delivery Method: Circle System Utilized Preoxygenation: Pre-oxygenation with 100% oxygen Intubation Type: IV induction Ventilation: Mask ventilation without difficulty Laryngoscope Size: Mac and 4 Grade View: Grade I Tube type: Oral Tube size: 7.5 mm Number of attempts: 1 Airway Equipment and Method: Stylet and Oral airway Placement Confirmation: ETT inserted through vocal cords under direct vision,  positive ETCO2 and breath sounds checked- equal and bilateral Secured at: 21 cm Tube secured with: Tape Dental Injury: Teeth and Oropharynx as per pre-operative assessment

## 2015-01-16 NOTE — Brief Op Note (Signed)
01/15/2015 - 01/16/2015  3:05 PM  PATIENT:  Darren Carr  64 y.o. male  PRE-OPERATIVE DIAGNOSIS:  BLADDER CANCER  POST-OPERATIVE DIAGNOSIS:  BLADDER CANCER  PROCEDURE:  Procedure(s): ROBOTIC ASSISTED LAPAROSCOPIC COMPLETE CYSTECT ILEAL CONDUIT/ROBOTIC ASSISTED LAPAROSCOPIC RADICAL PROSTATECTOMY (N/A) PELVIC LYMPH NODE DISSECTION (Bilateral) CYSTOSCOPY WITH INJECTION OF INDOCYANINE GREEN DYE (N/A)  SURGEON:  Surgeon(s) and Role:    * [name], MD - Primary  PHYSICIAN ASSISTANT:   ASSISTANTS: Merrilee Seashore PA; Will Baltazar Najjar MD   ANESTHESIA:   local and general  EBL:  Total I/O In: 2000 [I.V.:2000] Out: 100 [Blood:100]  BLOOD ADMINISTERED:none  DRAINS: 1 - JP to bulb; 2 - Urostomy to gravity drain wtih Bander stents (rt is red, left if blue)   LOCAL MEDICATIONS USED:  MARCAINE     SPECIMEN:  Source of Specimen:  pelvic lymph nodes, ureteral margins, cystoprostatectomy  DISPOSITION OF SPECIMEN:  PATHOLOGY  COUNTS:  YES  TOURNIQUET:  * No tourniquets in log *  DICTATION: .Other Dictation: Dictation Number [id number]  PLAN OF CARE: Admit to inpatient   PATIENT DISPOSITION:  PACU - hemodynamically stable.   Delay start of Pharmacological VTE agent (>24hrs) due to surgical blood loss or risk of bleeding: yes

## 2015-01-16 NOTE — Discharge Instructions (Signed)
°  1. Activity:  You are encouraged to ambulate frequently (about every hour during waking hours) to help prevent blood clots from forming in your legs or lungs.  However, you should not engage in any heavy lifting (> 10-15 lbs), strenuous activity, or straining. 2. Diet: You should continue a clear liquid diet until passing gas from below.  Once this occurs, you may advance your diet to a soft diet that would be easy to digest (i.e soups, scrambled eggs, mashed potatoes, etc.) for 24 hours just as you would if getting over a bad stomach flu.  If tolerating this diet well for 24 hours, you may then begin eating regular food.  It will be normal to have some amount of bloating, nausea, and abdominal discomfort intermittently. 3. Prescriptions:  You will be provided a prescription for pain medication to take as needed.  If your pain is not severe enough to require the prescription pain medication, you may take Tylenol instead.  You should also take an over the counter stool softener (Colace 100 mg twice daily) to avoid straining with bowel movements as the pain medication may constipate you.  4. Incisions: You may remove your dressing bandages the 2nd day after surgery.  Once the bandages are removed the incisions may stay open to air.  You may start showering (not soaking or bathing in water) 48 hours after surgery and the incisions simply need to be patted dry after the shower.  No additional care is needed. 5. What to call us about: You should call the office [phone]) if you develop fever > 101 or persistent vomiting. Also, feel free to call with any other questions you may have and remember the handout that was provided to you as a reference preoperatively which answers many of the common questions that arise after surgery. 6. You may resume aspirin, advil, aleve, vitamins, and supplements 7 days after surgery.

## 2015-01-16 NOTE — Progress Notes (Signed)
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 TURBT / exam under anesthesia in [place] 11/2014 confirms recurrent / large volume likely T3 high-grade bladder cancer with complete oblitteration of Rt UO by visible papillary cancer that is unresectable endoscopically. Restaging CT 11/2014 w/o advanced or distant disease.   He is scheduled for robotic cystoprostatectomy with ileal conduit today.   2 - Right Hydronephrosis - mod hydro to UVJ by staging CT 2015 on eval bladder cancer. Confirmed malignant obstruction. Most recent Cr 1.27.   Today "Darren Carr" is seen to proceed with cystoprostatecotmy. He completed bowel prep to clear and had stomal marking yesterday.  Objective: Vital signs in last 24 hours: Temp:  [97.9 F (36.6 C)-98.7 F (37.1 C)] 97.9 F (36.6 C) (05/04 0433) Pulse Rate:  [53-78] 56 (05/04 0433) Resp:  [15-18] 15 (05/04 0433) BP: (137-154)/(94-99) 154/99 mmHg (05/04 0433) SpO2:  [98 %-99 %] 99 % (05/04 0433) Weight:  [73.12 kg (161 lb 3.2 oz)] 73.12 kg (161 lb 3.2 oz) (05/03 1456) Last BM Date: 01/15/15  Intake/Output from previous day: 05/03 0701 - 05/04 0700 In: 480 [P.O.:480] Out: -  Intake/Output this shift:    General appearance: alert, cooperative, appears stated age and wife at bedside Head: Normocephalic, without obvious abnormality, atraumatic Nose: Nares normal. Septum midline. Mucosa normal. No drainage or sinus tenderness. Throat: lips, mucosa, and tongue normal; teeth and gums normal Neck: supple, symmetrical, trachea midline Back: symmetric, no curvature. ROM normal. No CVA tenderness. Resp: non-labored on room air Cardio: Nl rate GI: soft, non-tender; bowel sounds normal; no masses,  no organomegaly Male genitalia: normal Extremities: extremities normal, atraumatic, no cyanosis or edema Pulses: 2+ and symmetric Skin: Skin color, texture, turgor normal. No rashes or lesions Lymph nodes:  Cervical, supraclavicular, and axillary nodes normal. Neurologic: Grossly normal  Stomal marking site noted and favorable.  Lab Results:  No results for input(s): WBC, HGB, HCT, PLT in the last 72 hours. BMET No results for input(s): NA, K, CL, CO2, GLUCOSE, BUN, CREATININE, CALCIUM in the last 72 hours. PT/INR No results for input(s): LABPROT, INR in the last 72 hours. ABG No results for input(s): PHART, HCO3 in the last 72 hours.  Invalid input(s): PCO2, PO2  Studies/Results: No results found.  Anti-infectives: Anti-infectives    Start     Dose/Rate Route Frequency Ordered Stop   01/15/15 1600  piperacillin-tazobactam (ZOSYN) IVPB 3.375 g     3.375 g 100 mL/hr over 30 Minutes Intravenous 30 min pre-op 01/15/15 1519        Assessment/Plan:  1 - High Grade Bladder Cancer - proceed with surgery today as scheudled. Pt has good understaniding of risk, benefits, and expeted peri-op course.   2 - Right Hydronephrosis - This will hopefully improve after surgeyr today.   [facility], Darren Carr 01/16/2015

## 2015-01-17 ENCOUNTER — Encounter (HOSPITAL_COMMUNITY): Payer: Self-pay | Admitting: Urology

## 2015-01-17 LAB — BASIC METABOLIC PANEL
Anion gap: 4 — ABNORMAL LOW (ref 5–15)
BUN: 11 mg/dL (ref 6–20)
CALCIUM: 8 mg/dL — AB (ref 8.9–10.3)
CO2: 26 mmol/L (ref 22–32)
CREATININE: 1.07 mg/dL (ref 0.61–1.24)
Chloride: 99 mmol/L — ABNORMAL LOW (ref 101–111)
GFR calc Af Amer: >60 mL/min (ref >60)
GFR calc non Af Amer: >60 mL/min (ref >60)
GLUCOSE: 144 mg/dL — AB (ref 70–99)
Potassium: 4.2 mmol/L (ref 3.5–5.1)
Sodium: 129 mmol/L — ABNORMAL LOW (ref 135–145)

## 2015-01-17 LAB — HEMOGLOBIN AND HEMATOCRIT, BLOOD
HEMATOCRIT: 41.3 % (ref 39.0–52.0)
HEMOGLOBIN: 14.4 g/dL (ref 13.0–17.0)

## 2015-01-17 MED ORDER — PIPERACILLIN-TAZOBACTAM 3.375 G IVPB 30 MIN
3.3750 g | Freq: Three times a day (TID) | INTRAVENOUS | Status: AC
  Administered 2015-01-17 – 2015-01-19 (×6): 3.375 g via INTRAVENOUS
  Filled 2015-01-17 (×6): qty 50

## 2015-01-17 MED ORDER — VITAMIN B-1 100 MG PO TABS
100.0000 mg | ORAL_TABLET | Freq: Every day | ORAL | Status: DC
  Administered 2015-01-17 – 2015-01-22 (×6): 100 mg via ORAL
  Filled 2015-01-17 (×6): qty 1

## 2015-01-17 MED ORDER — THIAMINE HCL 100 MG/ML IJ SOLN
100.0000 mg | Freq: Every day | INTRAMUSCULAR | Status: DC
Start: 2015-01-17 — End: 2015-01-22

## 2015-01-17 MED ORDER — KETOROLAC TROMETHAMINE 15 MG/ML IJ SOLN
15.0000 mg | Freq: Four times a day (QID) | INTRAMUSCULAR | Status: AC
  Administered 2015-01-17 – 2015-01-18 (×4): 15 mg via INTRAVENOUS
  Filled 2015-01-17 (×4): qty 1

## 2015-01-17 MED ORDER — LORAZEPAM 1 MG PO TABS: 1.0000 mg | ORAL_TABLET | Freq: Four times a day (QID) | ORAL | Status: AC | PRN

## 2015-01-17 MED ORDER — LORAZEPAM 2 MG/ML IJ SOLN: 1.0000 mg | Freq: Four times a day (QID) | INTRAMUSCULAR | Status: AC | PRN

## 2015-01-17 MED ORDER — FOLIC ACID 1 MG PO TABS
1.0000 mg | ORAL_TABLET | Freq: Every day | ORAL | Status: DC
  Administered 2015-01-17 – 2015-01-22 (×6): 1 mg via ORAL
  Filled 2015-01-17 (×6): qty 1

## 2015-01-17 MED ORDER — ACETAMINOPHEN 500 MG PO TABS
1000.0000 mg | ORAL_TABLET | Freq: Three times a day (TID) | ORAL | Status: AC
  Administered 2015-01-17 – 2015-01-19 (×6): 1000 mg via ORAL
  Filled 2015-01-17 (×6): qty 2

## 2015-01-17 MED ORDER — ADULT MULTIVITAMIN W/MINERALS CH
1.0000 | ORAL_TABLET | Freq: Every day | ORAL | Status: DC
Start: 2015-01-17 — End: 2015-01-22
  Administered 2015-01-17 – 2015-01-21 (×5): 1 via ORAL
  Filled 2015-01-17 (×6): qty 1

## 2015-01-17 MED ORDER — ALVIMOPAN 12 MG PO CAPS
12.0000 mg | ORAL_CAPSULE | Freq: Two times a day (BID) | ORAL | Status: DC
  Administered 2015-01-17 – 2015-01-20 (×7): 12 mg via ORAL
  Filled 2015-01-17 (×11): qty 1

## 2015-01-17 NOTE — Evaluation (Signed)
Occupational Therapy Evaluation Patient Details Name: Darren Carr MRN: [id number] DOB: 1951/04/27 Today's Date: 01/17/2015    History of Present Illness This 64 year old man with a h/o ETOH, chronic back pain, and HTN was admitted for sx for bladder CA.  he is s/p cystoscopy, robotic assisted lap radical cystoprostatectomy with bilateral pelvic lymphadenectomy and open ileal conduit urinary diversion.     Clinical Impression   Pt was admitted for the above surgeries for bladder CA.  At baseline, he is independent with ADLs and works in Theatre manager for a [employer].  He currently needs min to mod A for mobility and up to total A for LB dressing due to pain.  Pt will benefit from skilled OT.  Goals are for mod I for toileting and bed mobility so that he can function with intermittent assistance.    Follow Up Recommendations  Home health OT (vs none, depending upon progress.  Pt will have intermittent supervision)    Equipment Recommendations  None recommended by OT    Recommendations for Other Services       Precautions / Restrictions Precautions Precautions: Fall Restrictions Weight Bearing Restrictions: No      Mobility Bed Mobility Overal bed mobility: Needs Assistance Bed Mobility: Rolling;Sidelying to Sit Rolling: Min assist Sidelying to sit: Mod assist       General bed mobility comments: cues for technique and assist for trunk to sit up  Transfers Overall transfer level: Needs assistance Equipment used: Rolling walker (2 wheeled) Transfers: Sit to/from [facility] Sit to Stand: Min assist Stand pivot transfers: Min assist       General transfer comment: light min A to rise from elevated bed.  cued for UE placement, but pt pushed up from RW    Balance                                            ADL Overall ADL's : Needs assistance/impaired                         Toilet Transfer: Minimal  assistance;Stand-pivot (to recliner)             General ADL Comments: Pt did not feel up to performing any ADLs due to pain.  Based on clinical judgment, pt will require set up for UB bathing, min A for UB dressing due to lines and max A for LB bathing, total A for LB dressing without AE.  He may have a reacher at home; he is unsure.     Vision     Perception     Praxis      Pertinent Vitals/Pain Pain Assessment: 0-10 Pain Score: 10-Worst pain ever Pain Location: abdomen, also shoulders/neck hurt after using RW to stand up Pain Descriptors / Indicators: Aching Pain Intervention(s): Limited activity within patient's tolerance;Monitored during session;Premedicated before session;Repositioned;RN gave pain meds during session;Heat applied (to shoulder/neck; has pain pump)     Hand Dominance     Extremity/Trunk Assessment Upper Extremity Assessment Upper Extremity Assessment:  (appears WFLs--MMT deferred due to surgery/pain)           Communication Communication Communication: No difficulties   Cognition Arousal/Alertness: Awake/alert Behavior During Therapy: WFL for tasks assessed/performed Overall Cognitive Status: Within Functional Limits for tasks assessed  General Comments       Exercises       Shoulder Instructions      Home Living Family/patient expects to be discharged to:: Private residence Living Arrangements: Spouse/significant other                 Bathroom Shower/Tub: Teacher, early years/pre: Standard     Home Equipment: Environmental consultant - 2 wheels;Bedside commode   Additional Comments: has DME from when daughter was in nursing home (? living)      Prior Functioning/Environment Level of Independence: Independent        Comments: Pt and wife both work for a [employer].     OT Diagnosis: Acute pain;Generalized weakness   OT Problem List: Decreased strength;Decreased activity  tolerance;Pain;Decreased knowledge of use of DME or AE   OT Treatment/Interventions: Self-care/ADL training;DME and/or AE instruction;Patient/family education    OT Goals(Current goals can be found in the care plan section) Acute Rehab OT Goals Patient Stated Goal: none stated; agreeable to OT OT Goal Formulation: With patient Time For Goal Achievement: 01/31/15 Potential to Achieve Goals: Good ADL Goals Pt Will Perform Grooming: with modified independence;standing Pt Will Transfer to Toilet: with modified independence;ambulating;bedside commode (and standing at toilet) Pt Will Perform Toileting - Clothing Manipulation and hygiene: with modified independence;sit to/from stand Additional ADL Goal #1: pt will perform LB adls with set up and AE, sit to stand vs. verbalizing use of AE Additional ADL Goal #2: pt will perform bed mobility at mod I level in preparation for adls/toilet transfers  OT Frequency: Min 2X/week   Barriers to D/C:            Co-evaluation              End of Session    Activity Tolerance: Patient limited by pain Patient left: in chair;with call bell/phone within reach;with nursing/sitter in room   Time: 2094-7096 OT Time Calculation (min): 26 min Charges:  OT General Charges $OT Visit: 1 Procedure OT Evaluation $Initial OT Evaluation Tier I: 1 Procedure G-Codes:    [name] Jan 27, 2015, 4:31 PM  Lesle Chris, OTR/L [phone] 2013-01-27

## 2015-01-17 NOTE — Consult Note (Signed)
WOC ostomy follow up Stoma type/location: RLQ ileal conduit. Pouching system leaked last night and is about to leak at the time of this visit Stomal assessment/size: Slightly larger than 1 and 1/2 inches round, red, moist. Two stents intact, red =  right ureter, Blue = left Peristomal assessment: intact, clear Treatment options for stomal/peristomal skin: 1-piece convex urostomy pouch Output tea colored urine Ostomy pouching: 1pc.convex urinary pouch  Education provided: Wife understand that stoma is edematous and that it will shrink in size over the next several weeks.  Wife observes pouch change and is appreciative for basic instruction regarding resizing as stoma shrinks.  They are both still overwhelmed she reports.  Patient is using OnQ pain medication  And is sleeping throughout this visit. Enrolled patient in [place] Start Discharge program: No WOC nursing team will follow, and will remain available to this patient, the nursing and medical teams.   Thanks, Maudie Flakes, MSN, RN, [place], [place], [place] [phone])

## 2015-01-17 NOTE — Op Note (Signed)
Darren Carr, Darren Carr                 ACCOUNT NO.:  [id number]  MEDICAL RECORD NO.:  [id number]  LOCATION:  7858                         FACILITY:  [facility]  PHYSICIAN:  [name], MD     DATE OF BIRTH:  October 27, 1950  DATE OF PROCEDURE: 01/16/2015  DATE OF DISCHARGE:                              OPERATIVE REPORT   DIAGNOSES: 1. Large volume high-grade bladder cancer, recurrent. 2. Right malignant hydronephrosis.  PROCEDURES: 1. Cystoscopy with indocyanine green dye injection. 2. Robotic-assisted laparoscopic radical cystoprostatectomy with     bilateral pelvic lymphadenectomy. 3. Open ileal conduit urinary diversion.  ASSISTANT: 1. Clemetine Marker, PA. 2. Gypsy Lore, MD.  ESTIMATED BLOOD LOSS:  100 mL.  COMPLICATIONS:  None.  DRAINS: 1. Jackson-Pratt drain to bulb suction. 2. Urostomy to straight drain with Bander stents, right is red and     left is blue. 3. On-Q pain catheter and extraction incision.  SPECIMENS: 1. Right and left distal ureteral margins, negative for carcinoma on     frozen section. 2. Right and left final distal ureteral margins. 3. Left external iliac lymph nodes. 4. Left external iliac lymph nodes, sentinel. 5. Left obturator lymph nodes. 6. Left pelvic lymph nodes, sentinel. 7. Left common iliac lymph nodes. 8. Right external iliac lymph nodes, sentinel. 9. Right obturator lymph nodes, sentinel. 10.Right common iliac lymph nodes. 11.Right periureteral lymph node, sentinel.  INDICATION:  Darren Carr is a 64 year old gentleman with history of alcoholism, who was found on workup for gross hematuria to have large volume high-grade bladder cancer as well as malignant obstruction with right hydroureteronephrosis.  He was initially managed in [place], [place]; however, he had recurrent disease on transurethral resection in his right hydronephrosis with distal obstruction, but not amenable to endoscopic therapy.  He was referred for consideration  of cystectomy. Imaging was performed to reveal no evidence of distant disease. Restaging transurethral resection corroborated persistent and recurrent high-grade cancer and complete obliteration of the right ureteral orifice.  Says that, he could not be completely resected endoscopically, was felt that indeed cystectomy was clearly warranted.  Options were discussed including the ablative therapies versus chemoradiation as well as various forms of urinary diversion, and wished to proceed with robotic cystectomy with ileal conduit urinary diversion.  Informed consent was obtained and placed in the medical record.  PROCEDURE IN DETAIL:  The patient being [facility], was verified. Procedure being robotic cystectomy with cysto and indocyanine green dye injection and open ileal conduit urinary diversion was confirmed. Procedure was carried out.  Time-out was performed.  Intravenous antibiotics were administered.  General endotracheal anesthesia was induced.  The patient was placed into a low lithotomy position and sterile field was created by prepping and draping the patient's penis, perineum, proximal thighs using iodine x3.  Next, cystourethroscopy was performed using a 24-French rigid cystoscope with offset lens. Inspection of the anterior and posterior urethra was unremarkable. Inspection of the urinary bladder revealed residual versus recurrent tumor in the right hemi-bladder with papillary changes.  There was complete obliteration of the right ureteral orifice.  Next, 2 mL of indocyanine green dye were injected in submucosal blebs surrounding area of the tumor.  The patient was then completely re-prepped and draped after tucking his arms.  Sequential compression devices were in place making him supine, further fashioning him to the operative table using 3- inch tape across his chest and foam padding.  New sterile field was created by clipper shaving and then prepping his infra-xiphoid  abdomen using chlorhexidine gluconate and his penis, perineum and proximal thighs using iodine.  New Foley catheter was placed per urethra to straight drain.  Next, a high-flow, low-pressure pneumoperitoneum was obtained using Veress technique and the supraumbilical midline having passed the aspiration and drop test.  An 8-mm robotic camera port was placed in this location.  Laparoscopic examination of the peritoneal cavity revealed no significant adhesions and no visceral injury. Additional ports were then placed as follows; right paramedian 8-mm robotic port, right far lateral 12-mm assistant port, right paramedian 15-mm assistant port at the site of the previously marked conduit side, left paramedian 8-mm robotic port and left far lateral 8-mm robotic port.  Robot was docked and passed through the electronic checks.  Now, attention was directed to development of the left retroperitoneum. Incision was made lateral to the descending colon from the area of the iliac vessels superiorly for distance of approximately 6 inches and distally towards the area of the ureterovesical junction.  The left ureter was encountered, this marked the vessel loop and dissected distally to the insertion of the bladder, was doubly clipped and ligated and tagged with a suture.  This dissected proximally for distance approximately 4 inches above the iliac crossing and then set aside.  The lateral aspect of the bladder was swept away from the pelvic sidewall towards the area of the endopelvic fascia, which was swept away from the lateral aspect of the prostate, this exposed the lymph node field in the left hemipelvis.  This was inspected under near-infrared fluorescence light and multiple sentinel lymph nodes were seen.  Lymphadenectomy was then performed first with left external iliac lymph node group with the confines being the left external iliac vein, artery, iliac bifurcation of pelvic sidewall.   Lymphostasis was achieved with cold clips.  The representative fluorescent node and set aside, labeled sentinel.  The rest of them set aside separately.  Similarly, the left obturator lymph nodes were dissected with confines being left external iliac vein, obturator nerve, and pelvic sidewall.  Lymphostasis was achieved with cold clips.  Again, representative sentinel lymph nodes were set aside separately.  The obturator nerve was inspected following these maneuvers and found to be intact.  The inferomedial aspect of this had also removed the lymphatic tissue from the course of the internal iliacs and was not felt that separate specimen was warranted from this area.  Next, all fiber fatty tissue in the confines of the left common iliac vessels were dissected free.  Lymphostasis was achieved with cold clips and set aside at the level of the left common iliac lymph nodes.  Similarly, a mirror image dissection was performed on the right side, identifying the right ureter, mobilizing it to the ureterovesical junction, appeared quite hydronephrotic, consistent with malignant obstruction.  Frozen section from both ureteral margins were negative for carcinoma.  It was total distance approximately 2 cm above the iliac crossing.  Right pelvic lymphadenectomy was similarly performed as per left with the same borders orientation.  Representative sentinel packets were set aside. There was also a single dominant very hyperfluorescent node and positioned approximately 1 cm above the aortic bifurcation in the periureteral location on the right.  This was dissected free and labeled the right periureteral lymph nodes.  A posterior peritoneal flap was created and posterior peritoneum was swept from the posterior aspect of the bladder towards the area of the prostate taking great care to enter the seminal vesicles and vas deferens remained with the specimen and dissection was proceeded towards the apex of  the prostate.  This exposed the vascular pedicles of the bladder and prostate, which were then controlled using sequential vascular load stapler x3 each side, taking great care to avoid rectal injury, which did not occur.  Next, the anterior dissection was performed between the medial umbilical ligaments, development of the space of Retzius.  This exposed the dorsal venous complex, which was controlled using vascular load stapler.  This exposed the membranous urethra, which was transected coldly.  In-situ Foley catheter was clipped and used as a bucket handle for final apical dissection.  This completely freed up the cystoprostatectomy specimen, which was placed into an extra large EndoCatch bag for later retrieval. The urethral stump was closed using 3-0 V-Loc in two layers to prevent troublesome drainage postoperatively.  The left ureter was then tunneled on the retroperitoneal space to the right side, approaching the right- sided posterior peritoneal incision under direct vision.  The two-tagged ureters were clipped with a separate clip and tag as was the terminal ileum.  All sponge and needle counts were correct.  Robot was then undocked.  The patient was made completely supine out of Trendelenburg position.  Specimen was retrieved by extending the previous camera port site inferiorly for total distance approximately 8 cm, removing the assisted prostatectomy specimen.  The Omni-Tract retractor was then employed.  The bowel was packed away.  The ureters were identified as per previously tagged as well as the terminal ileum by verifying the ileocecal junction.  Next, a segment of terminal ileum at 15 cm in length, 15 cm proximal to the ileocecal junction was taken out of continuity using green load stapler.  The mesentery was further developed using a white load stapler, two distally and one approximately.  The distal end was tagged for proper orientation.  Bowel was then brought back  in the continuity using side-to-side bowel anastomosis with green load stapler x2.  The anterior mesenteric border of the free-end was oversewn using running silk in two layers as was the acute angle of stapling buttressed and the mesenteric defect was closed using interrupted silk.  The conduit was laid in the retroperitoneal orientation.  So, no obvious twisting of the mesentery was seen.  It appeared to be completely reliable.  Attention was directed to the ureteroileal anastomosis.  First, the left ureter was anastomosed to the proximal end of the conduit by first everting the edges of the conduit mucosa via an 8-mm incision, placing a healed stitch of 4-0 Vicryl, placing a blue-colored Bander stent to 25 cm to the anastomosis and then performing ureteroenteric anastomosis with two separate running suture lines of 4-0 Vicryl.  This resulted in excellent tension-free reapproximation of these structures.  The stent was then tagged in the conduit using interrupted chromic suture to avoid inadvertent removal. Similarly, right ureteroenteric anastomosis was performed and positioned and then positioned just distal to the left, at this time, using a red- colored Bander stent to 20 cm to the anastomosis.  The previously-marked stoma site was then excised at the level of the skin in approximately quarter diameter.  Attention was directed to the fascia where cruciate incision was made and each  of the four apicis of this were tagged with a 2-0 Vicryl suture.  This was developed into the peritoneal cavity just had accommodated to surgeon's fingers and the Penrose drain was brought through this.  It was inspected via the extraction site and found to be under no tension and proper orientation.  Conduit site was matured by first anchoring the previous four anchoring fascial sutures to the distal conduit bowel edges and then rosebud maturation was performed, which resulted in excellent eversion of the  distal conduit, it remained pink and viable.  Omentum was brought down across the area of extraction site, and closed suction drain was brought through the previous left lateral most robotic port site near the peritoneal cavity through the straight drain.  The area of the extraction site was closed at the level of the fascia using figure-of-eight PDS x8.  An On-Q type pain catheter was placed directly onto the fascia, over which, Scarpa was reapproximated using running Vicryl.  All skin incisions were reapproximated using subcuticular Monocryl followed by Dermabond. Stomal appliance was placed.  There was a vigorous efflux of urine around the distal end of the stent. Procedure was then terminated.  The patient tolerated the procedure well.  There were no immediate periprocedural complications.  The patient was taken to the postanesthesia care unit in stable condition.          ______________________________ [name], MD     TM/MEDQ  D:  01/16/2015  T:  01/17/2015  Job:  [id number]

## 2015-01-17 NOTE — Progress Notes (Signed)
1 Day Post-Op   Subjective: The patient is doing well.  No nausea or vomiting. Pain is reasonably controlled.  Some leaking around ostomy site overnight.  Has not ambulated.  No flatus.  Objective: Vital signs in last 24 hours: Temp:  [97.8 F (36.6 C)-98.1 F (36.7 C)] 97.8 F (36.6 C) (05/04 2322) Pulse Rate:  [54-59] 55 (05/04 1630) Resp:  [14-25] 25 (05/05 0700) BP: (113-189)/(69-120) 159/106 mmHg (05/05 0700) SpO2:  [97 %-100 %] 98 % (05/05 0700) Weight:  [76.1 kg (167 lb 12.3 oz)] 76.1 kg (167 lb 12.3 oz) (05/04 1630)  Intake/Output from previous day: 05/04 0701 - 05/05 0700 In: 5300 [I.V.:5250; IV Piggyback:50] Out: 1150 [Urine:925; Drains:125; Blood:100] Intake/Output this shift:    Physical Exam:  General: Alert and oriented. CV: RRR Lungs: Clear bilaterally. GI: Soft but guarded and tender as expected, Nondistended, ostomy pink and protuberant, stents in place, pain pump in place, drain with serosang output Incisions: Clean, dry, and intact Urine: Clear Extremities: Nontender, no erythema, no edema.  Lab Results:  Recent Labs  01/16/15 1529 01/17/15 0610  HGB 13.9 14.4  HCT 40.3 41.3    Assessment/Plan: POD# 1 s/p robotic radical cystectomy and ileal conduit.  Progressing well so far.  Adequate urine output, vitals WNL and stable.    1) Continue IVF 2) Ambulate, Incentive spirometry 3) Add Toradol for pain control (15mg  q6hrs x 24 hours), continue narcotic regimen 4) Transfer to floor 5) Continue pain pump and JP drain     S: 1 - High Grade Bladder Cancer - s/p robotic cystoprostatectomy with cystoscopy and ICG sentinel + template pelvic lymphadenectomy and ileal conduit urinary diversion on 01/16/15. Observed in Stepdown POD 0 for monitoring. Path pending.  2 - Right Malignant Hydronephrosis - had right hydro from bladder cancer obstrucitng Rt UO pre-op. Now with relief of obstruction following surgery.  3 - Post-op Ileus - s/p bowel anastamosis as  part of cystectomy above. Received Entereg peri-op. Presently NPO  4 - Disposition - Pt independent at baseline. PT/OT eval pending this admission. Will need HHRN for new ostomy teaching reinforcement and supplies.   O: NAD in stepdown, AFVSS Urostomy pink / patent with Bander stents in situ. ONQ in place. JP with scant serous drainage.  A/P: 1 - Agree with plan above. To floor, begin ketorolac, PT / OT eval. Appreicate wound-osotmy RN assistance.

## 2015-01-18 MED ORDER — SENNOSIDES-DOCUSATE SODIUM 8.6-50 MG PO TABS
1.0000 | ORAL_TABLET | Freq: Two times a day (BID) | ORAL | Status: DC
  Administered 2015-01-18 – 2015-01-22 (×9): 1 via ORAL
  Filled 2015-01-18 (×9): qty 1

## 2015-01-18 MED ORDER — SPIRITUS FRUMENTI
1.0000 | Freq: Three times a day (TID) | ORAL | Status: DC
Start: 2015-01-18 — End: 2015-01-22
  Administered 2015-01-18 – 2015-01-21 (×9): 1 via ORAL
  Filled 2015-01-18 (×23): qty 1

## 2015-01-18 MED ORDER — PANTOPRAZOLE SODIUM 40 MG IV SOLR
40.0000 mg | Freq: Two times a day (BID) | INTRAVENOUS | Status: DC
  Administered 2015-01-18 – 2015-01-20 (×5): 40 mg via INTRAVENOUS
  Filled 2015-01-18 (×6): qty 40

## 2015-01-18 MED ORDER — KETOROLAC TROMETHAMINE 15 MG/ML IJ SOLN
15.0000 mg | Freq: Four times a day (QID) | INTRAMUSCULAR | Status: AC
  Administered 2015-01-18 – 2015-01-20 (×8): 15 mg via INTRAVENOUS
  Filled 2015-01-18 (×8): qty 1

## 2015-01-18 MED ORDER — ENOXAPARIN SODIUM 40 MG/0.4ML ~~LOC~~ SOLN
40.0000 mg | SUBCUTANEOUS | Status: DC
  Administered 2015-01-18 – 2015-01-21 (×4): 40 mg via SUBCUTANEOUS
  Filled 2015-01-18 (×5): qty 0.4

## 2015-01-18 NOTE — Progress Notes (Signed)
2 Days Post-Op   Subjective: Continues to do well.  No nausea or vomiting. Pain is reasonably and better controlled than yesterday.  Up out of bed yesterday, spent good amount of time in chair.  Passed some flatus this am.  Objective: Vital signs in last 24 hours: Temp:  [98.1 F (36.7 C)-98.8 F (37.1 C)] 98.1 F (36.7 C) (05/06 0506) Pulse Rate:  [65-72] 69 (05/06 0506) Resp:  [14-21] 19 (05/06 0506) BP: (141-172)/(91-114) 145/95 mmHg (05/06 0506) SpO2:  [93 %-99 %] 99 % (05/06 0506) Weight:  [72.666 kg (160 lb 3.2 oz)] 72.666 kg (160 lb 3.2 oz) (05/05 1054)  Intake/Output from previous day: 05/05 0701 - 05/06 0700 In: 4068.8 [P.O.:170; I.V.:3898.8] Out: 2710 [Urine:2250; Drains:460] Intake/Output this shift: Total I/O In: -  Out: 80 [Drains:80]  Physical Exam:  General: Alert and oriented. CV: RRR Lungs: Clear bilaterally. GI: Soft but guarded and still tender as expected, Nondistended, ostomy pink and protuberant, stents in place, pain pump in place, drain with serosang output Incisions: Clean, dry, and intact Urine: Clear Extremities: Nontender, no erythema, no edema.  Lab Results:  Recent Labs  01/16/15 1529 01/17/15 0610  HGB 13.9 14.4  HCT 40.3 41.3    Assessment/Plan: POD# 2 s/p robotic radical cystectomy and ileal conduit.  Progressing well so far.  Adequate urine output, vitals WNL and stable.    1) IVF to 50/hr 2) Ambulate, Incentive spirometry 3) Clear liquid diet 4) Lovenox 40mg  qday 5) Continue pain pump and JP drain 6) Senna/Colace 7) Watch for alcohol withdrawal 8) Ambulate 9) Ostomy teaching   I have seen and examined the pt and agree with the above assesment and plan as summarized below:  S: 1 - High Grade Bladder Cancer - s/p robotic cystoprostatectomy with cystoscopy and ICG sentinel + template pelvic lymphadenectomy and ileal conduit urinary diversion on 01/16/15. Observed in Stepdown POD 0 for monitoring. Path pending.  2 - Right  Malignant Hydronephrosis - had right hydro from bladder cancer obstrucitng Rt UO pre-op. Now with relief of obstruction following surgery.  3 - Post-op Ileus - s/p bowel anastamosis as part of cystectomy above. Received Entereg peri-op. + flatus POD 2 and started clears + prn alcohol.   4 - Disposition - Pt independent at baseline. PT/OT eval pending this admission. Will need HHRN for new ostomy teaching reinforcement and supplies.   O: NAD in stepdown, AFVSS Urostomy pink / patent with Bander stents in situ. ONQ in place. JP with t serous drainage.  A/P: 1 - ambulated, clears, lovenox proph, begin prn alchol with meals in addition to supplements for h/o very heavy alcohol intake.

## 2015-01-18 NOTE — Consult Note (Signed)
WOC ostomy follow up Stoma type/location:  RLQ ileal conduit; pouch intact with good seal and was applied yesterday. Stomal assessment/size:  Stoma red and viable when assessed through the pouch, 2 stints in place. Output: Mod amt yellow urine in bedside drainage bag Ostomy pouching: 1pc. Convex with barrier ring  Education provided: Demonstrated removing bedside drainage bag and opening and closing bottom valve to empty.  Pt is able to perform without assistance.  Supplies at bedside for staff nurse use.  Educational materials left at bedside and briefly discussed ordering supplies. [facility] team will continue to follow while in the hospital for further teaching sessions. Pt could benefit from home health assistance after discharge. Enrolled patient in [place] Start Discharge program: Yes Julien Girt MSN, RN, [place], [place], [place]

## 2015-01-18 NOTE — Progress Notes (Signed)
PT Cancellation Note  Patient Details Name: Darren Carr MRN: [id number] DOB: November 15, 1950   Cancelled Treatment:    Reason Eval/Treat Not Completed: Pain limiting ability to participate (pt was sleeping soundly, per family he's in a lot of pain and recently had an injection for pain that makes him sleepy, family requested he be allowed to rest right now. Will follow. )   Philomena Doheny 01/18/2015, 1:23 PM [phone]

## 2015-01-19 LAB — TYPE AND SCREEN
ABO/RH(D): O POS
Antibody Screen: NEGATIVE
UNIT DIVISION: 0
Unit division: 0

## 2015-01-19 LAB — BASIC METABOLIC PANEL
ANION GAP: 8 (ref 5–15)
BUN: 8 mg/dL (ref 6–20)
CO2: 27 mmol/L (ref 22–32)
Calcium: 8.4 mg/dL — ABNORMAL LOW (ref 8.9–10.3)
Chloride: 100 mmol/L — ABNORMAL LOW (ref 101–111)
Creatinine, Ser: 1.11 mg/dL (ref 0.61–1.24)
Glucose, Bld: 108 mg/dL — ABNORMAL HIGH (ref 70–99)
POTASSIUM: 3.7 mmol/L (ref 3.5–5.1)
SODIUM: 135 mmol/L (ref 135–145)

## 2015-01-19 LAB — HEMOGLOBIN AND HEMATOCRIT, BLOOD
HCT: 37.8 % — ABNORMAL LOW (ref 39.0–52.0)
HEMOGLOBIN: 12.8 g/dL — AB (ref 13.0–17.0)

## 2015-01-19 NOTE — Evaluation (Signed)
Physical Therapy Evaluation Patient Details Name: Darren Carr MRN: [id number] DOB: Oct 01, 1950 Today's Date: 01/19/2015   History of Present Illness  This 64 year old man with a h/o ETOH, chronic back pain, and HTN was admitted for sx for bladder CA. he is s/p cystoscopy, robotic assisted lap radical cystoprostatectomy with bilateral pelvic lymphadenectomy and open ileal conduit urinary diversion.   Clinical Impression  Pt presents at mod I level with mobility and transfers and gait.  Pt with no further PT needs at this time.    Follow Up Recommendations No PT follow up    Equipment Recommendations  None recommended by PT    Recommendations for Other Services       Precautions / Restrictions Restrictions Weight Bearing Restrictions: No      Mobility  Bed Mobility Overal bed mobility: Modified Independent             General bed mobility comments: increased time  Transfers Overall transfer level: Modified independent Equipment used: Rolling walker (2 wheeled)                Ambulation/Gait Ambulation/Gait assistance: Modified independent (Device/Increase time) Ambulation Distance (Feet): 500 Feet Assistive device: Rolling walker (2 wheeled)       General Gait Details: pt with no LOB or SOB during gait with RW >500'.  PT encouraged pt to walk with nursing 3-4x a day to keep strength up.  Stairs            Wheelchair Mobility    Modified Rankin (Stroke Patients Only)       Balance                                             Pertinent Vitals/Pain Pain Score: 2  Pain Location: abdomen Pain Intervention(s): Premedicated before session    Home Living Family/patient expects to be discharged to:: Private residence Living Arrangements: Spouse/significant other             Home Equipment: Environmental consultant - 2 wheels;Bedside commode      Prior Function Level of Independence: Independent         Comments: Pt and wife both  work for a [employer].      Hand Dominance        Extremity/Trunk Assessment               Lower Extremity Assessment: Generalized weakness         Communication   Communication: No difficulties  Cognition Arousal/Alertness: Awake/alert Behavior During Therapy: WFL for tasks assessed/performed Overall Cognitive Status: Within Functional Limits for tasks assessed                      General Comments      Exercises        Assessment/Plan    PT Assessment Patent does not need any further PT services  PT Diagnosis     PT Problem List    PT Treatment Interventions     PT Goals (Current goals can be found in the Care Plan section)      Frequency     Barriers to discharge        Co-evaluation               End of Session Equipment Utilized During Treatment: Gait belt Activity Tolerance: Patient tolerated treatment well Patient  left: in bed;with call bell/phone within reach;with family/visitor present           Time: 0920-0935 PT Time Calculation (min) (ACUTE ONLY): 15 min   Charges:   PT Evaluation $Initial PT Evaluation Tier I: 1 Procedure     PT G Codes:        Marquite Attwood Feb 16, 2015, 10:14 AM

## 2015-01-19 NOTE — Progress Notes (Signed)
3 Days Post-Op Subjective: Patient reports some abd pain. Passing flatus, no BM.   Objective: Vital signs in last 24 hours: Temp:  [98.2 F (36.8 C)-98.7 F (37.1 C)] 98.2 F (36.8 C) (05/07 0437) Pulse Rate:  [59-69] 59 (05/07 0437) Resp:  [20] 20 (05/07 0437) BP: (130-151)/(96-109) 150/98 mmHg (05/07 0437) SpO2:  [95 %-98 %] 97 % (05/07 0437)  Intake/Output from previous day: 05/06 0701 - 05/07 0700 In: 1857.1 [P.O.:480; I.V.:1277.1; IV Piggyback:100] Out: 17 [Urine:3500; Drains:280] Intake/Output this shift:    Physical Exam:  NAD, watching TV Abd - soft, NT, ND, ostomy pink/viable, stents in place. Urine clear. Inc c/d/i, JP slowing down.  Ext - no calf pain or swelling.   Lab Results:  Recent Labs  01/16/15 1529 01/17/15 0610 01/19/15 0435  HGB 13.9 14.4 12.8*  HCT 40.3 41.3 37.8*   BMET  Recent Labs  01/17/15 0610 01/19/15 0435  NA 129* 135  K 4.2 3.7  CL 99* 100*  CO2 26 27  GLUCOSE 144* 108*  BUN 11 8  CREATININE 1.07 1.11  CALCIUM 8.0* 8.4*   No results for input(s): LABPT, INR in the last 72 hours. No results for input(s): LABURIN in the last 72 hours. Results for orders placed or performed during the hospital encounter of 01/15/15  Surgical pcr screen     Status: None   Collection Time: 01/15/15  2:28 PM  Result Value Ref Range Status   MRSA, PCR NEGATIVE NEGATIVE Final   Staphylococcus aureus NEGATIVE NEGATIVE Final    Comment:        The Xpert SA Assay (FDA approved for NASAL specimens in patients over 75 years of age), is one component of a comprehensive surveillance program.  Test performance has been validated by [facility] for patients greater than or equal to 31 year old. It is not intended to diagnose infection nor to guide or monitor treatment.     Studies/Results: No results found.  A/P - POD#3 -robotic-assisted cystectomy and ileal conduit - stable.  --continue clears and 1/2 maintenance ivf --ambulating well -  continue     LOS: 4 days   Bronnie Vasseur 01/19/2015, 8:59 AM

## 2015-01-20 LAB — HEMOGLOBIN AND HEMATOCRIT, BLOOD
HEMATOCRIT: 38.6 % — AB (ref 39.0–52.0)
HEMOGLOBIN: 13.3 g/dL (ref 13.0–17.0)

## 2015-01-20 LAB — MAGNESIUM: MAGNESIUM: 1.9 mg/dL (ref 1.7–2.4)

## 2015-01-20 LAB — BASIC METABOLIC PANEL
ANION GAP: 10 (ref 5–15)
BUN: 11 mg/dL (ref 6–20)
CO2: 25 mmol/L (ref 22–32)
CREATININE: 1.12 mg/dL (ref 0.61–1.24)
Calcium: 8.4 mg/dL — ABNORMAL LOW (ref 8.9–10.3)
Chloride: 99 mmol/L — ABNORMAL LOW (ref 101–111)
GFR calc Af Amer: >60 mL/min (ref >60)
GFR calc non Af Amer: >60 mL/min (ref >60)
GLUCOSE: 101 mg/dL — AB (ref 70–99)
POTASSIUM: 3.3 mmol/L — AB (ref 3.5–5.1)
Sodium: 134 mmol/L — ABNORMAL LOW (ref 135–145)

## 2015-01-20 LAB — CREATININE, FLUID (PLEURAL, PERITONEAL, JP DRAINAGE): Creat, Fluid: 1.1 mg/dL

## 2015-01-20 MED ORDER — POTASSIUM CHLORIDE 10 MEQ/100ML IV SOLN
10.0000 meq | INTRAVENOUS | Status: AC
Start: 2015-01-20 — End: 2015-01-20
  Administered 2015-01-20 (×3): 10 meq via INTRAVENOUS
  Filled 2015-01-20 (×3): qty 100

## 2015-01-20 NOTE — Progress Notes (Signed)
Paged Dr Verlin Fester about the pts bright red, bloody  and increased amount of drainage from his JP  330cc,  No orders given at this time will continue to monitor. Jeanie Sewer, RN 3:02 AM 01/20/2015

## 2015-01-20 NOTE — Progress Notes (Signed)
4 Days Post-Op Subjective: Patient reports he is ready to eat. Tired of broth and jello. He's been walking halls more. He's passing flatus and had some small mucousy BM.   Nurse called last night with increased JP output.   Objective: Vital signs in last 24 hours: Temp:  [98 F (36.7 C)-99 F (37.2 C)] 99 F (37.2 C) (05/08 0435) Pulse Rate:  [52-67] 61 (05/08 0435) Resp:  [18-20] 18 (05/08 0435) BP: (140-159)/(88-96) 140/96 mmHg (05/08 0435) SpO2:  [97 %-99 %] 97 % (05/08 0435)  Intake/Output from previous day: 05/07 0701 - 05/08 0700 In: 350 [I.V.:300; IV Piggyback:50] Out: 1995 [Urine:1500; Drains:495] Intake/Output this shift:    Physical Exam:  NAD Sitting on edge of bed Stoma viable JP serosanguinous   Lab Results:  Recent Labs  01/19/15 0435 01/20/15 0612  HGB 12.8* 13.3  HCT 37.8* 38.6*   BMET  Recent Labs  01/19/15 0435 01/20/15 0612  NA 135 134*  K 3.7 3.3*  CL 100* 99*  CO2 27 25  GLUCOSE 108* 101*  BUN 8 11  CREATININE 1.11 1.12  CALCIUM 8.4* 8.4*   No results for input(s): LABPT, INR in the last 72 hours. No results for input(s): LABURIN in the last 72 hours. Results for orders placed or performed during the hospital encounter of 01/15/15  Surgical pcr screen     Status: None   Collection Time: 01/15/15  2:28 PM  Result Value Ref Range Status   MRSA, PCR NEGATIVE NEGATIVE Final   Staphylococcus aureus NEGATIVE NEGATIVE Final    Comment:        The Xpert SA Assay (FDA approved for NASAL specimens in patients over 24 years of age), is one component of a comprehensive surveillance program.  Test performance has been validated by [facility] for patients greater than or equal to 17 year old. It is not intended to diagnose infection nor to guide or monitor treatment.     Studies/Results: No results found.  Assessment/Plan: POD#4 radical cystectomy and IC - doing well. Increased JP output might reflect increased activity with  drain picking up more abd fluid. I sent for Cr. Will start regular diet. Replace K. Continue JP.    LOS: 5 days   Miriya Cloer 01/20/2015, 10:16 AM

## 2015-01-21 LAB — BASIC METABOLIC PANEL
Anion gap: 6 (ref 5–15)
BUN: 10 mg/dL (ref 6–20)
CO2: 26 mmol/L (ref 22–32)
Calcium: 8 mg/dL — ABNORMAL LOW (ref 8.9–10.3)
Chloride: 101 mmol/L (ref 101–111)
Creatinine, Ser: 1.09 mg/dL (ref 0.61–1.24)
GFR calc Af Amer: >60 mL/min (ref >60)
GFR calc non Af Amer: >60 mL/min (ref >60)
GLUCOSE: 99 mg/dL (ref 70–99)
POTASSIUM: 3.5 mmol/L (ref 3.5–5.1)
Sodium: 133 mmol/L — ABNORMAL LOW (ref 135–145)

## 2015-01-21 LAB — HEMOGLOBIN AND HEMATOCRIT, BLOOD
HEMATOCRIT: 36.2 % — AB (ref 39.0–52.0)
Hemoglobin: 12.3 g/dL — ABNORMAL LOW (ref 13.0–17.0)

## 2015-01-21 MED ORDER — HYDROMORPHONE HCL 4 MG PO TABS
4.0000 mg | ORAL_TABLET | ORAL | Status: DC | PRN
  Administered 2015-01-21 (×2): 4 mg via ORAL
  Filled 2015-01-21 (×2): qty 1

## 2015-01-21 MED ORDER — HYDROMORPHONE HCL 4 MG PO TABS
8.0000 mg | ORAL_TABLET | ORAL | Status: DC | PRN
  Administered 2015-01-21 – 2015-01-22 (×4): 8 mg via ORAL
  Filled 2015-01-21 (×4): qty 2

## 2015-01-21 MED ORDER — PANTOPRAZOLE SODIUM 40 MG PO TBEC
40.0000 mg | DELAYED_RELEASE_TABLET | Freq: Every day | ORAL | Status: DC
  Administered 2015-01-21: 40 mg via ORAL
  Filled 2015-01-21: qty 1

## 2015-01-21 NOTE — Plan of Care (Signed)
Problem: Phase II Progression Outcomes Goal: Progress activity as tolerated unless otherwise ordered Outcome: Completed/Met Date Met:  01/21/15 Patient walking in halls independently, approx [phone] ft x 2. Tolerating activity well.  Goal: Discharge plan established Outcome: Completed/Met Date Met:  01/21/15 Home with Home Health

## 2015-01-21 NOTE — Progress Notes (Signed)
Occupational Therapy Treatment Patient Details Name: Darren Carr MRN: [id number] DOB: Jan 19, 1951 Today's Date: 01/21/2015    History of present illness This 64 year old man with a h/o ETOH, chronic back pain, and HTN was admitted for sx for bladder CA. he is s/p cystoscopy, robotic assisted lap radical cystoprostatectomy with bilateral pelvic lymphadenectomy and open ileal conduit urinary diversion.    OT comments  Pt overall mod I with ADL and independent with mobility without AD. OT goals met. Pt ready to D/C home with intermittent S when medically stable. OT signing off.   Follow Up Recommendations  No OT follow up;Supervision - Intermittent    Equipment Recommendations  None recommended by OT    Recommendations for Other Services      Precautions / Restrictions Precautions Precautions: None       Mobility Bed Mobility Overal bed mobility: Independent                Transfers Overall transfer level: Independent                    Balance Overall balance assessment: No apparent balance deficits (not formally assessed)                                 ADL Overall ADL's : Modified independent  Pt independently manipulating drains/foley bag                              discussed home safety and reducing risk of falls.. Wife/pt verbalized understanding.                                              Cognition   Behavior During Therapy: WFL for tasks assessed/performed Overall Cognitive Status: Within Functional Limits for tasks assessed                       Extremity/Trunk Assessment   WFL for ADL                              Pertinent Vitals/ Pain       Pain Assessment: 0-10 Pain Score: 2  Pain Location: abdomen Pain Descriptors / Indicators: Sore Pain Intervention(s): Limited activity within patient's tolerance  Home Living                                           Prior Functioning/Environment              Frequency       Progress Toward Goals  OT Goals(current goals can now be found in the care plan section)  Progress towards OT goals: Goals met/education completed, patient discharged from OT  Acute Rehab OT Goals Patient Stated Goal: to go home OT Goal Formulation: With patient Time For Goal Achievement: 01/31/15 Potential to Achieve Goals: Good ADL Goals Pt Will Perform Grooming: with modified independence;standing Pt Will Transfer to Toilet: with modified independence;ambulating;bedside commode Pt Will Perform Toileting - Clothing Manipulation and hygiene: with modified independence;sit to/from stand Additional ADL Goal #1: pt will perform LB adls  with set up and AE, sit to stand vs. verbalizing use of AE Additional ADL Goal #2: pt will perform bed mobility at mod I level in preparation for adls/toilet transfers  Plan Discharge plan needs to be updated    Co-evaluation                 End of Session     Activity Tolerance Patient tolerated treatment well   Patient Left in bed;with call bell/phone within reach;with family/visitor present   Nurse Communication Mobility status        Time: 6047-9987 OT Time Calculation (min): 13 min  Charges: OT General Charges $OT Visit: 1 Procedure OT Treatments $Self Care/Home Management : 8-22 mins  Nawaf Strange,HILLARY 01/21/2015, 2:23 PM   [facility], OTR/L  [phone] 01/21/2015

## 2015-01-21 NOTE — Consult Note (Signed)
WOC ostomy follow up Stoma type/location: RLQ ileal conduit Stomal assessment/size: 1 and 1/2 inches round, red, moist with 2 stents intact Peristomal assessment: intact, clear Treatment options for stomal/peristomal skin: skin barrier ring Output gold colored urine Ostomy pouching: 1pc. Convex urostomy pouch with skin barrier ring Education provided: Patient and family present for extended session on pouch change.  Patient able to demonstrate emptying pouch into the toilet independently.  He can also attach and disconnect from the bedside urinary drainage bag.  Patient and family instructed to change pouch twice weekly and to use the bedside drainage bag at hs and on "sick' days. Stent education provided, also how to obtain supplies post discharge.  He is to have an [facility] for several weeks as the stoma is changing size and shape. Family asking appropriate questions regarding activity, diet, showering, etc. All answers provided and contact information shared in the event of questions post discharge. Enrolled patient in [place] Start Discharge program: Yes Ready for discharge from a [place] Nurse standpoint. [place] nursing team will follow, and will remain available to this patient, the nursing and medical teams.  Thanks, Maudie Flakes, MSN, RN, [place], [place], [place] [phone])

## 2015-01-21 NOTE — Progress Notes (Signed)
5 Days Post-Op  Subjective:  1 - High Grade Bladder Cancer - s/p robotic cystoprostatectomy with cystoscopy and ICG sentinel + template pelvic lymphadenectomy and ileal conduit urinary diversion on 01/16/15 for pT3aN1Mx urothelial carcinoma. Some dysplasia (not frank carcinoma) at final rt ureteral and peri-urethral margins. Single positive node was ICG sentinal and high peri-ureteral on right. Observed in Stepdown POD 0 for monitoring. Transferred to floor POD 1. PT/OT eval recs home w/o needs. JP Cr same as serum 01/20/15.   2 - Right Malignant Hydronephrosis - had right hydro from bladder cancer obstrucitng Rt UO pre-op. Now with relief of obstruction following surgery.  3 - Post-op Ileus - s/p bowel anastamosis as part of cystectomy above. Received Entereg peri-op. + flatus POD 2 and started clears + prn alcohol. Formed BM's began 5/8 (POD 4) and advanced to reg diet.   4 - Disposition - Pt independent at baseline. No PT/OT needs by most recent in house eval. Will need [facility] for new ostomy teaching reinforcement and supplies.   Today "Darren Carr" is w/o specific complaints. 2 BM's yesterday, ambulating w/o walker. JP Cr same as serum.   Objective: Vital signs in last 24 hours: Temp:  [98.1 F (36.7 C)-98.3 F (36.8 C)] 98.3 F (36.8 C) (05/09 0447) Pulse Rate:  [59-72] 59 (05/09 0447) Resp:  [16-18] 16 (05/09 0447) BP: (119-136)/(82-91) 135/89 mmHg (05/09 0447) SpO2:  [98 %-99 %] 99 % (05/09 0447) Last BM Date: 01/15/15  Intake/Output from previous day: 05/08 0701 - 05/09 0700 In: 1920 [P.O.:720; I.V.:1150; IV Piggyback:50] Out: 2025 [KYHCW:2376; Drains:295] Intake/Output this shift:    General appearance: alert, cooperative, appears stated age and wife at bedside Eyes: negative Nose: Nares normal. Septum midline. Mucosa normal. No drainage or sinus tenderness. Throat: lips, mucosa, and tongue normal; teeth and gums normal Neck: supple, symmetrical, trachea midline Back: symmetric, no  curvature. ROM normal. No CVA tenderness. Resp: non-labored on room air Cardio: Nl rate GI: soft, non-tender; bowel sounds normal; no masses,  no organomegaly Male genitalia: normal Extremities: extremities normal, atraumatic, no cyanosis or edema Pulses: 2+ and symmetric Skin: Skin color, texture, turgor normal. No rashes or lesions Neurologic: Grossly normal Incision/Wound: RLQ Urostomy pink / patent with bander stents in situ. JP with serous output. All incision sites c/d/i.   Lab Results:   Recent Labs  01/20/15 0612 01/21/15 0538  HGB 13.3 12.3*  HCT 38.6* 36.2*   BMET  Recent Labs  01/20/15 0612 01/21/15 0538  NA 134* 133*  K 3.3* 3.5  CL 99* 101  CO2 25 26  GLUCOSE 101* 99  BUN 11 10  CREATININE 1.12 1.09  CALCIUM 8.4* 8.0*   PT/INR No results for input(s): LABPROT, INR in the last 72 hours. ABG No results for input(s): PHART, HCO3 in the last 72 hours.  Invalid input(s): PCO2, PO2  Studies/Results: No results found.  Anti-infectives: Anti-infectives    Start     Dose/Rate Route Frequency Ordered Stop   01/17/15 2230  piperacillin-tazobactam (ZOSYN) IVPB 3.375 g     3.375 g 12.5 mL/hr over 240 Minutes Intravenous 3 times per day 01/17/15 2157 01/19/15 1825   01/15/15 1600  piperacillin-tazobactam (ZOSYN) IVPB 3.375 g     3.375 g 100 mL/hr over 30 Minutes Intravenous 30 min pre-op 01/15/15 1519 01/16/15 0845      Assessment/Plan:  1 - High Grade Bladder Cancer - doing well POD 5. Will discuss path with pt / family later today including rec for adjuvant chemo at  about 3-34mos post-op pending clinical status. Saline lock, transition to PO dilaudid with goal of wean off IV breakghrough meds and find home regimen.   2 - Right Malignant Hydronephrosis - GFR remains normal, now s/p excision of offending lesion and stents in place.   3 - Post-op Ileus - resolved.   4 - Disposition - progressing very well. Hopefully DC tomorrow with [facility] as long as does  well today.   [facility], [name] 01/21/2015

## 2015-01-22 MED ORDER — SENNOSIDES-DOCUSATE SODIUM 8.6-50 MG PO TABS: 1.0000 | ORAL_TABLET | Freq: Two times a day (BID) | ORAL | Status: DC

## 2015-01-22 MED ORDER — HYDROMORPHONE HCL 4 MG PO TABS: 4.0000 mg | ORAL_TABLET | ORAL | Status: DC | PRN

## 2015-01-22 NOTE — Progress Notes (Signed)
Spoke with pt and wife at bedside concerning [place]. Pt had no preference, [facility] was selected referral given to in house rep.

## 2015-01-22 NOTE — Discharge Summary (Signed)
Physician Discharge Summary  Patient ID: Darren Carr MRN: [id number] DOB/AGE: September 17, 1950 64 y.o.  Admit date: 01/15/2015 Discharge date: 01/22/2015  Admission Diagnoses: Bladder Cancer with Malignant Right Hydronephrosis  Discharge Diagnoses: Metastatic Bladder Cancer   Discharged Condition: good  Hospital Course:   1 - High Grade Metastatic Bladder Cancer - s/p robotic cystoprostatectomy with cystoscopy and ICG sentinel + template pelvic lymphadenectomy and ileal conduit urinary diversion on 01/16/15 for pT3aN1Mx urothelial carcinoma. Some dysplasia (not frank carcinoma) at final rt ureteral and peri-urethral margins. Single positive node was ICG sentinal and high peri-ureteral on right. Admitted day prior to surgery for bowel prep and stomal marking. Observed in Stepdown POD 0 for monitoring. Transferred to floor POD 1. PT/OT eval recs home w/o needs. JP Cr same as serum 01/20/15 and removed.   2 - Right Malignant Hydronephrosis - had right hydro from bladder cancer obstrucitng Rt UO pre-op. Now with relief of obstruction following surgery.  3 - Post-op Ileus - s/p bowel anastamosis as part of cystectomy above. Received Entereg peri-op. + flatus POD 2 and started clears + prn alcohol. Formed BM's began 5/8 (POD 4) and advanced to reg diet which he is tollerating at discharge.   4 - Disposition - Pt independent at baseline. No PT/OT needs by most recent in house eval. Received new ostomy suppport by ostomy RN with plan for [facility] ton continue teaching / supplies.   Overall, the patient had uneventful post-op course following major surgery and being discharged POD 6.    Consults: PT, OT, Ostomy RN  Significant Diagnostic Studies: labs: as per above  Treatments: surgery:  robotic cystoprostatectomy with cystoscopy and ICG sentinel + template pelvic lymphadenectomy and ileal conduit urinary diversion on 01/16/15 for pT3aN1Mx urothelial carcinoma  Discharge Exam: Blood pressure 117/77, pulse  74, temperature 99.5 F (37.5 C), temperature source Oral, resp. rate 16, height 5' 10.5" (1.791 m), weight 72.666 kg (160 lb 3.2 oz), SpO2 99 %. General appearance: alert, cooperative, appears stated age and wife at bedside Head: Normocephalic, without obvious abnormality, atraumatic Eyes: negative Nose: Nares normal. Septum midline. Mucosa normal. No drainage or sinus tenderness. Throat: lips, mucosa, and tongue normal; teeth and gums normal Neck: supple, symmetrical, trachea midline Back: symmetric, no curvature. ROM normal. No CVA tenderness. Resp: non-labored on room air Cardio: Nl rate GI: soft, non-tender; bowel sounds normal; no masses,  no organomegaly Male genitalia: normal Extremities: extremities normal, atraumatic, no cyanosis or edema Pulses: 2+ and symmetric Skin: Skin color, texture, turgor normal. No rashes or lesions Lymph nodes: Cervical, supraclavicular, and axillary nodes normal. Neurologic: Grossly normal Incision/Wound: Recent port sites / extraction sites all c/d/i. RLQ Urostomy pink / patent with right (red) and left (blue) bander stents in situ with clear urine and some mucus as in appliance as expected.   Disposition: 01-Home or Self Care with [facility] RN     Medication List    STOP taking these medications        Oxycodone HCl 10 MG Tabs      TAKE these medications        HYDROmorphone 4 MG tablet  Commonly known as:  DILAUDID  Take 1-2 tablets (4-8 mg total) by mouth every 4 (four) hours as needed for moderate pain or severe pain. Post-operatively     lisinopril 5 MG tablet  Commonly known as:  PRINIVIL,ZESTRIL  Take 5 mg by mouth daily.     omeprazole 20 MG capsule  Commonly known as:  PRILOSEC  Take  20 mg by mouth daily as needed (Heartburn).     senna-docusate 8.6-50 MG per tablet  Commonly known as:  Senokot-S  Take 1 tablet by mouth 2 (two) times daily. While taking pain meds to prevent constipation           Follow-up Information     Follow up with [name], MD On 01/31/2015.   Specialty:  Urology   Why:  at 11:00   Contact information:   [place] [address] [phone]       Signed: [name] 01/22/2015, 7:32 AM

## 2015-02-23 ENCOUNTER — Inpatient Hospital Stay (HOSPITAL_COMMUNITY): Payer: BLUE CROSS/BLUE SHIELD

## 2015-02-23 ENCOUNTER — Encounter (HOSPITAL_COMMUNITY)
Admission: EM | Disposition: A | Discharge status: Home / Self Care | Payer: BLUE CROSS/BLUE SHIELD | Source: Home / Self Care | Attending: Cardiology

## 2015-02-23 ENCOUNTER — Inpatient Hospital Stay (HOSPITAL_COMMUNITY)
Admission: EM | Admit: 2015-02-23 | Discharge: 2015-03-09 | DRG: 981 | Disposition: A | Discharge status: Home / Self Care | Payer: BLUE CROSS/BLUE SHIELD | Attending: Cardiology | Admitting: Cardiology

## 2015-02-23 DIAGNOSIS — F17210 Nicotine dependence, cigarettes, uncomplicated: Secondary | ICD-10-CM | POA: Diagnosis present

## 2015-02-23 DIAGNOSIS — Z936 Other artificial openings of urinary tract status: Secondary | ICD-10-CM

## 2015-02-23 DIAGNOSIS — N1330 Unspecified hydronephrosis: Secondary | ICD-10-CM

## 2015-02-23 DIAGNOSIS — F10939 Alcohol use, unspecified with withdrawal, unspecified: Secondary | ICD-10-CM

## 2015-02-23 DIAGNOSIS — M549 Dorsalgia, unspecified: Secondary | ICD-10-CM | POA: Diagnosis present

## 2015-02-23 DIAGNOSIS — N390 Urinary tract infection, site not specified: Secondary | ICD-10-CM | POA: Diagnosis present

## 2015-02-23 DIAGNOSIS — Z906 Acquired absence of other parts of urinary tract: Secondary | ICD-10-CM | POA: Diagnosis present

## 2015-02-23 DIAGNOSIS — N179 Acute kidney failure, unspecified: Secondary | ICD-10-CM | POA: Diagnosis not present

## 2015-02-23 DIAGNOSIS — E860 Dehydration: Secondary | ICD-10-CM | POA: Diagnosis present

## 2015-02-23 DIAGNOSIS — I2510 Atherosclerotic heart disease of native coronary artery without angina pectoris: Secondary | ICD-10-CM | POA: Diagnosis present

## 2015-02-23 DIAGNOSIS — E875 Hyperkalemia: Secondary | ICD-10-CM | POA: Diagnosis present

## 2015-02-23 DIAGNOSIS — Z79899 Other long term (current) drug therapy: Secondary | ICD-10-CM | POA: Diagnosis not present

## 2015-02-23 DIAGNOSIS — I2109 ST elevation (STEMI) myocardial infarction involving other coronary artery of anterior wall: Principal | ICD-10-CM | POA: Diagnosis present

## 2015-02-23 DIAGNOSIS — B9620 Unspecified Escherichia coli [E. coli] as the cause of diseases classified elsewhere: Secondary | ICD-10-CM | POA: Diagnosis present

## 2015-02-23 DIAGNOSIS — E872 Acidosis: Secondary | ICD-10-CM | POA: Diagnosis present

## 2015-02-23 DIAGNOSIS — Z7902 Long term (current) use of antithrombotics/antiplatelets: Secondary | ICD-10-CM

## 2015-02-23 DIAGNOSIS — G8929 Other chronic pain: Secondary | ICD-10-CM | POA: Diagnosis present

## 2015-02-23 DIAGNOSIS — G9340 Encephalopathy, unspecified: Secondary | ICD-10-CM | POA: Diagnosis present

## 2015-02-23 DIAGNOSIS — Z8551 Personal history of malignant neoplasm of bladder: Secondary | ICD-10-CM | POA: Diagnosis not present

## 2015-02-23 DIAGNOSIS — I25110 Atherosclerotic heart disease of native coronary artery with unstable angina pectoris: Secondary | ICD-10-CM

## 2015-02-23 DIAGNOSIS — K208 Other esophagitis: Secondary | ICD-10-CM | POA: Diagnosis present

## 2015-02-23 DIAGNOSIS — I129 Hypertensive chronic kidney disease with stage 1 through stage 4 chronic kidney disease, or unspecified chronic kidney disease: Secondary | ICD-10-CM | POA: Diagnosis present

## 2015-02-23 DIAGNOSIS — N189 Chronic kidney disease, unspecified: Secondary | ICD-10-CM | POA: Diagnosis present

## 2015-02-23 DIAGNOSIS — E780 Pure hypercholesterolemia: Secondary | ICD-10-CM | POA: Diagnosis present

## 2015-02-23 DIAGNOSIS — I2129 ST elevation (STEMI) myocardial infarction involving other sites: Secondary | ICD-10-CM | POA: Insufficient documentation

## 2015-02-23 DIAGNOSIS — R079 Chest pain, unspecified: Secondary | ICD-10-CM | POA: Diagnosis present

## 2015-02-23 DIAGNOSIS — K2960 Other gastritis without bleeding: Secondary | ICD-10-CM | POA: Diagnosis present

## 2015-02-23 DIAGNOSIS — R7881 Bacteremia: Secondary | ICD-10-CM | POA: Diagnosis present

## 2015-02-23 DIAGNOSIS — K920 Hematemesis: Secondary | ICD-10-CM | POA: Diagnosis present

## 2015-02-23 DIAGNOSIS — N131 Hydronephrosis with ureteral stricture, not elsewhere classified: Secondary | ICD-10-CM | POA: Diagnosis present

## 2015-02-23 DIAGNOSIS — F10239 Alcohol dependence with withdrawal, unspecified: Secondary | ICD-10-CM

## 2015-02-23 DIAGNOSIS — F10230 Alcohol dependence with withdrawal, uncomplicated: Secondary | ICD-10-CM | POA: Diagnosis present

## 2015-02-23 DIAGNOSIS — I219 Acute myocardial infarction, unspecified: Secondary | ICD-10-CM

## 2015-02-23 DIAGNOSIS — I213 ST elevation (STEMI) myocardial infarction of unspecified site: Secondary | ICD-10-CM

## 2015-02-23 DIAGNOSIS — Z7982 Long term (current) use of aspirin: Secondary | ICD-10-CM

## 2015-02-23 DIAGNOSIS — K922 Gastrointestinal hemorrhage, unspecified: Secondary | ICD-10-CM

## 2015-02-23 HISTORY — PX: CARDIAC CATHETERIZATION: SHX172

## 2015-02-23 HISTORY — DX: ST elevation (STEMI) myocardial infarction involving other coronary artery of anterior wall: I21.09

## 2015-02-23 HISTORY — DX: ST elevation (STEMI) myocardial infarction involving other sites: I21.29

## 2015-02-23 LAB — CBC
HCT: 37.1 % — ABNORMAL LOW (ref 39.0–52.0)
Hemoglobin: 12.3 g/dL — ABNORMAL LOW (ref 13.0–17.0)
MCH: 30 pg (ref 26.0–34.0)
MCHC: 33.2 g/dL (ref 30.0–36.0)
MCV: 90.5 fL (ref 78.0–100.0)
Platelets: 317 10*3/uL (ref 150–400)
RBC: 4.1 MIL/uL — ABNORMAL LOW (ref 4.22–5.81)
RDW: 15.1 % (ref 11.5–15.5)
WBC: 19.8 10*3/uL — AB (ref 4.0–10.5)

## 2015-02-23 LAB — COMPREHENSIVE METABOLIC PANEL
ALK PHOS: 204 U/L — AB (ref 38–126)
ALK PHOS: 185 U/L — AB (ref 38–126)
ALT: 71 U/L — ABNORMAL HIGH (ref 17–63)
ALT: 71 U/L — ABNORMAL HIGH (ref 17–63)
ANION GAP: 13 (ref 5–15)
AST: 93 U/L — AB (ref 15–41)
AST: 117 U/L — ABNORMAL HIGH (ref 15–41)
Albumin: 2.3 g/dL — ABNORMAL LOW (ref 3.5–5.0)
Albumin: 2.2 g/dL — ABNORMAL LOW (ref 3.5–5.0)
Anion gap: 10 (ref 5–15)
BILIRUBIN TOTAL: 0.6 mg/dL (ref 0.3–1.2)
BILIRUBIN TOTAL: 0.5 mg/dL (ref 0.3–1.2)
BUN: 107 mg/dL — ABNORMAL HIGH (ref 6–20)
BUN: 108 mg/dL — ABNORMAL HIGH (ref 6–20)
CHLORIDE: 111 mmol/L (ref 101–111)
CO2: 14 mmol/L — ABNORMAL LOW (ref 22–32)
CO2: 15 mmol/L — ABNORMAL LOW (ref 22–32)
Calcium: 8.9 mg/dL (ref 8.9–10.3)
Calcium: 8.6 mg/dL — ABNORMAL LOW (ref 8.9–10.3)
Chloride: 116 mmol/L — ABNORMAL HIGH (ref 101–111)
Creatinine, Ser: 2.8 mg/dL — ABNORMAL HIGH (ref 0.61–1.24)
Creatinine, Ser: 2.51 mg/dL — ABNORMAL HIGH (ref 0.61–1.24)
GFR calc non Af Amer: 23 mL/min — ABNORMAL LOW (ref >60)
GFR calc non Af Amer: 26 mL/min — ABNORMAL LOW (ref >60)
GFR, EST AFRICAN AMERICAN: 26 mL/min — AB (ref >60)
GFR, EST AFRICAN AMERICAN: 30 mL/min — AB (ref >60)
GLUCOSE: 117 mg/dL — AB (ref 65–99)
Glucose, Bld: 154 mg/dL — ABNORMAL HIGH (ref 65–99)
Potassium: 6.1 mmol/L (ref 3.5–5.1)
Potassium: 6.5 mmol/L (ref 3.5–5.1)
Sodium: 138 mmol/L (ref 135–145)
Sodium: 141 mmol/L (ref 135–145)
TOTAL PROTEIN: 7.5 g/dL (ref 6.5–8.1)
TOTAL PROTEIN: 6.7 g/dL (ref 6.5–8.1)

## 2015-02-23 LAB — URINE MICROSCOPIC-ADD ON

## 2015-02-23 LAB — URINALYSIS, ROUTINE W REFLEX MICROSCOPIC
Bilirubin Urine: NEGATIVE
GLUCOSE, UA: NEGATIVE mg/dL
KETONES UR: NEGATIVE mg/dL
Nitrite: NEGATIVE
Protein, ur: NEGATIVE mg/dL
Specific Gravity, Urine: 1.014 (ref 1.005–1.030)
UROBILINOGEN UA: 0.2 mg/dL (ref 0.0–1.0)
pH: 7 (ref 5.0–8.0)

## 2015-02-23 LAB — I-STAT CHEM 8, ED
BUN: 106 mg/dL — ABNORMAL HIGH (ref 6–20)
BUN: 107 mg/dL — ABNORMAL HIGH (ref 6–20)
Calcium, Ion: 1.17 mmol/L (ref 1.13–1.30)
Calcium, Ion: 1.23 mmol/L (ref 1.13–1.30)
Chloride: 116 mmol/L — ABNORMAL HIGH (ref 101–111)
Chloride: 117 mmol/L — ABNORMAL HIGH (ref 101–111)
Creatinine, Ser: 2.5 mg/dL — ABNORMAL HIGH (ref 0.61–1.24)
Creatinine, Ser: 2.5 mg/dL — ABNORMAL HIGH (ref 0.61–1.24)
GLUCOSE: 136 mg/dL — AB (ref 65–99)
Glucose, Bld: 151 mg/dL — ABNORMAL HIGH (ref 65–99)
HCT: 45 % (ref 39.0–52.0)
HCT: 46 % (ref 39.0–52.0)
HEMOGLOBIN: 15.3 g/dL (ref 13.0–17.0)
Hemoglobin: 15.6 g/dL (ref 13.0–17.0)
Potassium: 6.4 mmol/L (ref 3.5–5.1)
Potassium: 6.9 mmol/L (ref 3.5–5.1)
Sodium: 139 mmol/L (ref 135–145)
Sodium: 140 mmol/L (ref 135–145)
TCO2: 13 mmol/L (ref 0–100)
TCO2: 14 mmol/L (ref 0–100)

## 2015-02-23 LAB — POCT I-STAT, CHEM 8
BUN: 99 mg/dL — ABNORMAL HIGH (ref 6–20)
CALCIUM ION: 1.3 mmol/L (ref 1.13–1.30)
Chloride: 118 mmol/L — ABNORMAL HIGH (ref 101–111)
Creatinine, Ser: 2.2 mg/dL — ABNORMAL HIGH (ref 0.61–1.24)
Glucose, Bld: 127 mg/dL — ABNORMAL HIGH (ref 65–99)
HEMATOCRIT: 39 % (ref 39.0–52.0)
HEMOGLOBIN: 13.3 g/dL (ref 13.0–17.0)
Potassium: 5.9 mmol/L — ABNORMAL HIGH (ref 3.5–5.1)
SODIUM: 143 mmol/L (ref 135–145)
TCO2: 13 mmol/L (ref 0–100)

## 2015-02-23 LAB — LACTIC ACID, PLASMA: Lactic Acid, Venous: 2.5 mmol/L (ref 0.5–2.0)

## 2015-02-23 LAB — CBC WITH DIFFERENTIAL/PLATELET
BASOS ABS: 0 10*3/uL (ref 0.0–0.1)
BASOS PCT: 0 % (ref 0–1)
Eosinophils Absolute: 0 10*3/uL (ref 0.0–0.7)
Eosinophils Relative: 0 % (ref 0–5)
HCT: 38.9 % — ABNORMAL LOW (ref 39.0–52.0)
HEMOGLOBIN: 13.3 g/dL (ref 13.0–17.0)
Lymphocytes Relative: 6 % — ABNORMAL LOW (ref 12–46)
Lymphs Abs: 1.2 10*3/uL (ref 0.7–4.0)
MCH: 31 pg (ref 26.0–34.0)
MCHC: 34.2 g/dL (ref 30.0–36.0)
MCV: 90.7 fL (ref 78.0–100.0)
Monocytes Absolute: 1.9 10*3/uL — ABNORMAL HIGH (ref 0.1–1.0)
Monocytes Relative: 9 % (ref 3–12)
NEUTROS PCT: 85 % — AB (ref 43–77)
Neutro Abs: 18.3 10*3/uL — ABNORMAL HIGH (ref 1.7–7.7)
PLATELETS: 372 10*3/uL (ref 150–400)
RBC: 4.29 MIL/uL (ref 4.22–5.81)
RDW: 15.3 % (ref 11.5–15.5)
WBC: 21.4 10*3/uL — AB (ref 4.0–10.5)

## 2015-02-23 LAB — MRSA PCR SCREENING: MRSA by PCR: NEGATIVE

## 2015-02-23 LAB — PROTIME-INR
INR: 1.19 (ref 0.00–1.49)
PROTHROMBIN TIME: 15.3 s — AB (ref 11.6–15.2)

## 2015-02-23 LAB — APTT: aPTT: 22 seconds — ABNORMAL LOW (ref 24–37)

## 2015-02-23 LAB — I-STAT TROPONIN, ED: TROPONIN I, POC: 1.16 ng/mL — AB (ref 0.00–0.08)

## 2015-02-23 LAB — CBG MONITORING, ED: Glucose-Capillary: 134 mg/dL — ABNORMAL HIGH (ref 65–99)

## 2015-02-23 LAB — MAGNESIUM: MAGNESIUM: 2 mg/dL (ref 1.7–2.4)

## 2015-02-23 LAB — TROPONIN I: TROPONIN I: 9.19 ng/mL — AB (ref <0.031)

## 2015-02-23 SURGERY — LEFT HEART CATH AND CORONARY ANGIOGRAPHY
Anesthesia: LOCAL

## 2015-02-23 MED ORDER — NITROGLYCERIN 0.4 MG SL SUBL: 0.4000 mg | SUBLINGUAL_TABLET | SUBLINGUAL | Status: DC | PRN

## 2015-02-23 MED ORDER — ASPIRIN 81 MG PO CHEW
CHEWABLE_TABLET | ORAL | Status: AC
  Filled 2015-02-23: qty 4

## 2015-02-23 MED ORDER — MIDAZOLAM HCL 2 MG/2ML IJ SOLN
INTRAMUSCULAR | Status: DC | PRN
  Administered 2015-02-23 (×2): 1 mg via INTRAVENOUS

## 2015-02-23 MED ORDER — ASPIRIN 325 MG PO TABS
ORAL_TABLET | ORAL | Status: DC | PRN
  Administered 2015-02-23: 325 mg via ORAL

## 2015-02-23 MED ORDER — ONDANSETRON HCL 4 MG/2ML IJ SOLN
4.0000 mg | Freq: Four times a day (QID) | INTRAMUSCULAR | Status: DC | PRN
  Administered 2015-02-23: 4 mg via INTRAVENOUS

## 2015-02-23 MED ORDER — CLOPIDOGREL BISULFATE 300 MG PO TABS
ORAL_TABLET | ORAL | Status: AC
  Filled 2015-02-23: qty 1

## 2015-02-23 MED ORDER — LORAZEPAM 2 MG/ML IJ SOLN
2.0000 mg | Freq: Once | INTRAMUSCULAR | Status: AC
  Administered 2015-02-23: 2 mg via INTRAVENOUS

## 2015-02-23 MED ORDER — SODIUM CHLORIDE 0.9 % IJ SOLN
3.0000 mL | Freq: Two times a day (BID) | INTRAMUSCULAR | Status: DC
  Administered 2015-02-23 – 2015-02-25 (×3): 3 mL via INTRAVENOUS
  Administered 2015-02-25 (×2): via INTRAVENOUS
  Administered 2015-02-26 – 2015-03-08 (×18): 3 mL via INTRAVENOUS

## 2015-02-23 MED ORDER — ACETAMINOPHEN 325 MG PO TABS: 650.0000 mg | ORAL_TABLET | ORAL | Status: DC | PRN

## 2015-02-23 MED ORDER — SODIUM CHLORIDE 0.9 % IV SOLN
INTRAVENOUS | Status: DC
Start: 2015-02-23 — End: 2015-02-24

## 2015-02-23 MED ORDER — LORAZEPAM 2 MG/ML IJ SOLN
INTRAMUSCULAR | Status: AC
  Filled 2015-02-23: qty 1

## 2015-02-23 MED ORDER — ASPIRIN 300 MG RE SUPP: 300.0000 mg | RECTAL | Status: DC

## 2015-02-23 MED ORDER — LIDOCAINE HCL (PF) 1 % IJ SOLN
INTRAMUSCULAR | Status: AC
  Filled 2015-02-23: qty 30

## 2015-02-23 MED ORDER — NITROGLYCERIN IN D5W 200-5 MCG/ML-% IV SOLN: 10.0000 ug/min | INTRAVENOUS | Status: DC

## 2015-02-23 MED ORDER — ONDANSETRON HCL 4 MG/2ML IJ SOLN
4.0000 mg | Freq: Four times a day (QID) | INTRAMUSCULAR | Status: DC | PRN
  Filled 2015-02-23: qty 2

## 2015-02-23 MED ORDER — FAMOTIDINE IN NACL 20-0.9 MG/50ML-% IV SOLN
20.0000 mg | Freq: Once | INTRAVENOUS | Status: AC
  Administered 2015-02-23: 20 mg via INTRAVENOUS
  Filled 2015-02-23: qty 50

## 2015-02-23 MED ORDER — SODIUM CHLORIDE 0.9 % IV SOLN
50.0000 ug/h | INTRAVENOUS | Status: DC
  Filled 2015-02-23 (×2): qty 1

## 2015-02-23 MED ORDER — DEXTROSE 50 % IV SOLN
1.0000 | Freq: Once | INTRAVENOUS | Status: AC
  Administered 2015-02-23: 50 mL via INTRAVENOUS
  Filled 2015-02-23: qty 50

## 2015-02-23 MED ORDER — ASPIRIN 81 MG PO CHEW: 324.0000 mg | CHEWABLE_TABLET | ORAL | Status: DC

## 2015-02-23 MED ORDER — SODIUM BICARBONATE 8.4 % IV SOLN
INTRAVENOUS | Status: AC
  Filled 2015-02-23: qty 50

## 2015-02-23 MED ORDER — ASPIRIN EC 81 MG PO TBEC
81.0000 mg | DELAYED_RELEASE_TABLET | Freq: Every day | ORAL | Status: DC
  Administered 2015-02-24 – 2015-03-09 (×14): 81 mg via ORAL
  Filled 2015-02-23 (×15): qty 1

## 2015-02-23 MED ORDER — CALCIUM GLUCONATE 10 % IV SOLN
1.0000 g | Freq: Once | INTRAVENOUS | Status: AC
  Administered 2015-02-23: 1 g via INTRAVENOUS

## 2015-02-23 MED ORDER — SODIUM CHLORIDE 0.9 % IV BOLUS (SEPSIS)
1000.0000 mL | Freq: Once | INTRAVENOUS | Status: AC
  Administered 2015-02-23: 1000 mL via INTRAVENOUS

## 2015-02-23 MED ORDER — PIPERACILLIN-TAZOBACTAM 3.375 G IVPB
3.3750 g | Freq: Three times a day (TID) | INTRAVENOUS | Status: DC
  Administered 2015-02-24 – 2015-02-27 (×10): 3.375 g via INTRAVENOUS
  Filled 2015-02-23 (×12): qty 50

## 2015-02-23 MED ORDER — SODIUM BICARBONATE 8.4 % IV SOLN
50.0000 meq | Freq: Once | INTRAVENOUS | Status: AC
  Administered 2015-02-23: 50 meq via INTRAVENOUS
  Filled 2015-02-23: qty 50

## 2015-02-23 MED ORDER — VANCOMYCIN HCL IN DEXTROSE 1-5 GM/200ML-% IV SOLN
1000.0000 mg | INTRAVENOUS | Status: DC
  Administered 2015-02-23 – 2015-02-26 (×4): 1000 mg via INTRAVENOUS
  Filled 2015-02-23 (×5): qty 200

## 2015-02-23 MED ORDER — INSULIN ASPART 100 UNIT/ML ~~LOC~~ SOLN: 10.0000 [IU] | Freq: Once | SUBCUTANEOUS | Status: DC

## 2015-02-23 MED ORDER — MIDAZOLAM HCL 2 MG/2ML IJ SOLN
INTRAMUSCULAR | Status: AC
  Filled 2015-02-23: qty 2

## 2015-02-23 MED ORDER — SODIUM CHLORIDE 0.9 % IV SOLN
INTRAVENOUS | Status: DC
  Administered 2015-02-23: via INTRAVENOUS

## 2015-02-23 MED ORDER — DEXTROSE 50 % IV SOLN
INTRAVENOUS | Status: AC
  Filled 2015-02-23: qty 50

## 2015-02-23 MED ORDER — INSULIN ASPART 100 UNIT/ML ~~LOC~~ SOLN
5.0000 [IU] | Freq: Once | SUBCUTANEOUS | Status: AC
  Administered 2015-02-23: 5 [IU] via INTRAVENOUS

## 2015-02-23 MED ORDER — SODIUM POLYSTYRENE SULFONATE 15 GM/60ML PO SUSP
30.0000 g | Freq: Once | ORAL | Status: AC
Start: 2015-02-23 — End: 2015-02-23
  Administered 2015-02-23: 30 g via ORAL
  Filled 2015-02-23: qty 120

## 2015-02-23 MED ORDER — IOHEXOL 350 MG/ML SOLN
INTRAVENOUS | Status: DC | PRN
  Administered 2015-02-23: 75 mL via INTRAVENOUS

## 2015-02-23 MED ORDER — DEXTROSE 50 % IV SOLN: 1.0000 | Freq: Once | INTRAVENOUS | Status: DC

## 2015-02-23 MED ORDER — HEPARIN (PORCINE) IN NACL 2-0.9 UNIT/ML-% IJ SOLN
INTRAMUSCULAR | Status: AC
  Filled 2015-02-23: qty 500

## 2015-02-23 MED ORDER — NITROGLYCERIN 1 MG/10 ML FOR IR/CATH LAB
INTRA_ARTERIAL | Status: AC
  Filled 2015-02-23: qty 10

## 2015-02-23 MED ORDER — PANTOPRAZOLE SODIUM 40 MG IV SOLR
40.0000 mg | INTRAVENOUS | Status: AC
  Administered 2015-02-23: 40 mg via INTRAVENOUS
  Filled 2015-02-23: qty 40

## 2015-02-23 MED ORDER — CALCIUM GLUCONATE 10 % IV SOLN
INTRAVENOUS | Status: AC
  Filled 2015-02-23: qty 10

## 2015-02-23 MED ORDER — SODIUM CHLORIDE 0.9 % IV SOLN: 250.0000 mL | INTRAVENOUS | Status: DC | PRN

## 2015-02-23 MED ORDER — SODIUM CHLORIDE 0.9 % IJ SOLN: 3.0000 mL | INTRAMUSCULAR | Status: DC | PRN

## 2015-02-23 MED ORDER — SODIUM CHLORIDE 0.9 % IV SOLN: 1.0000 g | Freq: Once | INTRAVENOUS | Status: DC

## 2015-02-23 MED ORDER — OCTREOTIDE LOAD VIA INFUSION
50.0000 ug | Freq: Once | INTRAVENOUS | Status: DC
  Filled 2015-02-23: qty 25

## 2015-02-23 MED ORDER — CARVEDILOL 3.125 MG PO TABS
3.1250 mg | ORAL_TABLET | Freq: Two times a day (BID) | ORAL | Status: DC
  Filled 2015-02-23 (×3): qty 1

## 2015-02-23 MED ORDER — NITROGLYCERIN IN D5W 200-5 MCG/ML-% IV SOLN
INTRAVENOUS | Status: DC | PRN
  Administered 2015-02-23: 10 ug/min via INTRAVENOUS

## 2015-02-23 MED ORDER — NITROGLYCERIN IN D5W 200-5 MCG/ML-% IV SOLN
INTRAVENOUS | Status: AC
  Filled 2015-02-23: qty 250

## 2015-02-23 MED ORDER — ATORVASTATIN CALCIUM 40 MG PO TABS
40.0000 mg | ORAL_TABLET | Freq: Every day | ORAL | Status: DC
  Administered 2015-02-24 – 2015-03-08 (×13): 40 mg via ORAL
  Filled 2015-02-23 (×14): qty 1

## 2015-02-23 MED ORDER — SODIUM CHLORIDE 0.9 % IV SOLN: INTRAVENOUS | Status: AC

## 2015-02-23 MED ORDER — HEPARIN (PORCINE) IN NACL 2-0.9 UNIT/ML-% IJ SOLN
INTRAMUSCULAR | Status: AC
  Filled 2015-02-23: qty 1000

## 2015-02-23 MED ORDER — PANTOPRAZOLE SODIUM 40 MG IV SOLR
40.0000 mg | Freq: Two times a day (BID) | INTRAVENOUS | Status: DC
Start: 2015-02-23 — End: 2015-02-27
  Administered 2015-02-23 – 2015-02-27 (×8): 40 mg via INTRAVENOUS
  Filled 2015-02-23 (×9): qty 40

## 2015-02-23 MED ORDER — SODIUM BICARBONATE 8.4 % IV SOLN
50.0000 meq | Freq: Once | INTRAVENOUS | Status: AC
  Administered 2015-02-23: 50 meq via INTRAVENOUS
  Filled 2015-02-23 (×2): qty 50

## 2015-02-23 MED ORDER — SODIUM CHLORIDE 0.9 % IV SOLN
INTRAVENOUS | Status: DC
  Administered 2015-02-23 (×2): 10 mL/h via INTRAVENOUS
  Administered 2015-02-23: 300 mL/h via INTRAVENOUS

## 2015-02-23 MED ORDER — ACETAMINOPHEN 325 MG PO TABS
650.0000 mg | ORAL_TABLET | ORAL | Status: DC | PRN
  Administered 2015-02-28: 650 mg via ORAL
  Filled 2015-02-23: qty 2

## 2015-02-23 SURGICAL SUPPLY — 10 items
CATH INFINITI 5FR JL5 (CATHETERS) ×1 IMPLANT
CATH INFINITI 5FR MULTPACK ANG (CATHETERS) ×2 IMPLANT
CATH INFINITI 6F FL5 (CATHETERS) ×1 IMPLANT
KIT HEART LEFT (KITS) ×2 IMPLANT
PACK CARDIAC CATHETERIZATION (CUSTOM PROCEDURE TRAY) ×2 IMPLANT
SHEATH PINNACLE 5F 10CM (SHEATH) ×1 IMPLANT
SHEATH PINNACLE 6F 10CM (SHEATH) ×1 IMPLANT
SYR MEDRAD MARK V 150ML (SYRINGE) ×2 IMPLANT
TRANSDUCER W/STOPCOCK (MISCELLANEOUS) ×2 IMPLANT
WIRE EMERALD 3MM-J .035X150CM (WIRE) ×2 IMPLANT

## 2015-02-23 NOTE — Consult Note (Signed)
Referring Provider: Dr. Terrence Dupont Primary Care Physician:  Terald Sleeper, PA-C Primary Gastroenterologist:  Althia Forts  Reason for Consultation:  Coffee grounds emesis  HPI: Darren Carr is a 64 y.o. male with a history of alcohol abuse (reports no alcohol in the past 5 days) who has been having abdominal pain and coffee grounds emesis and then was found to have ST elevation on ECG and was taken to the cath lab. Has 3 vessel disease on cath. He is unable to tell me how much coffee grounds emesis he has had and no family present at this time. Not able to tell me about his abdominal pain stating that he did not have any pain. Denies abdominal pain, melena, or hematochezia. +Weakness. History of an esophageal stricture with dilation in 2014 by Dr. Laural Golden. Antral gastritis and a small hiatal hernia were noted. No varices seen on that EGD. Denies NSAIDs. Hgb 12.3.   Past Medical History  Diagnosis Date  . Alcoholic   . Chronic back pain   . Upper GI bleed   . Bladder tumor 07-2014  . Hypertension     Past Surgical History  Procedure Laterality Date  . Appendectomy    . Esophagogastroduodenoscopy (egd) with propofol N/A 08/16/2013    Procedure: ESOPHAGOGASTRODUODENOSCOPY (EGD) WITH PROPOFOL Hiatus at 40cm Gastroesophageal Junction at 37cm;  Surgeon: Rogene Houston, MD;  Location: [facility];  Service: Endoscopy;  Laterality: N/A;  . Balloon dilation N/A 08/16/2013    Procedure: BALLOON DILATION to 16.76mm;  Surgeon: Rogene Houston, MD;  Location: [facility];  Service: Endoscopy;  Laterality: N/A;  . Esophageal biopsy  08/16/2013    Procedure: DISTAL ESOPHAGEAL BIOPSIES;  Surgeon: Rogene Houston, MD;  Location: [facility];  Service: Endoscopy;;  . Transurethral resection of prostate  07-2014,08-2014,09-2014  . Transurethral resection of bladder tumor with gyrus (turbt-gyrus) N/A 12/05/2014    Procedure: TRANSURETHRAL RESECTION OF BLADDER TUMOR WITH GYRUS (TURBT-GYRUS);  Surgeon: [name], MD;   Location: [facility];  Service: Urology;  Laterality: N/A;  . Cystoscopy w/ ureteral stent placement Bilateral 12/05/2014    Procedure: CYSTOSCOPY WITH BILATERAL RETROGRADE PYELOGRAM;  Surgeon: [name], MD;  Location: [facility];  Service: Urology;  Laterality: Bilateral;  . Robot assisted laparoscopic complete cystect ileal conduit N/A 01/16/2015    Procedure: ROBOTIC ASSISTED LAPAROSCOPIC COMPLETE CYSTECT ILEAL CONDUIT/ROBOTIC ASSISTED LAPAROSCOPIC RADICAL PROSTATECTOMY;  Surgeon: [name], MD;  Location: [facility];  Service: Urology;  Laterality: N/A;  . Lymphadenectomy Bilateral 01/16/2015    Procedure: PELVIC LYMPH NODE DISSECTION;  Surgeon: [name], MD;  Location: [facility];  Service: Urology;  Laterality: Bilateral;  . Cystoscopy with injection N/A 01/16/2015    Procedure: CYSTOSCOPY WITH INJECTION OF INDOCYANINE GREEN DYE;  Surgeon: [name], MD;  Location: [facility];  Service: Urology;  Laterality: N/A;    Prior to Admission medications   Medication Sig Start Date End Date Taking? Authorizing Provider  Oxycodone HCl 10 MG TABS Take 10-20 mg by mouth 4 (four) times daily.   Yes Historical Provider, MD  traMADol (ULTRAM) 50 MG tablet Take 50-100 mg by mouth every 6 (six) hours as needed for moderate pain or severe pain.   Yes Historical Provider, MD  HYDROmorphone (DILAUDID) 4 MG tablet Take 1-2 tablets (4-8 mg total) by mouth every 4 (four) hours as needed for moderate pain or severe pain. Post-operatively Patient not taking: Reported on 02/23/2015 01/22/15   [name], MD  lisinopril (PRINIVIL,ZESTRIL) 5 MG tablet Take 5 mg by  mouth daily.    Historical Provider, MD  omeprazole (PRILOSEC) 20 MG capsule Take 20 mg by mouth daily as needed (Heartburn).     Historical Provider, MD  senna-docusate (SENOKOT-S) 8.6-50 MG per tablet Take 1 tablet by mouth 2 (two) times daily. While taking pain meds to prevent constipation 01/22/15   [name], MD    Scheduled Meds: . [START ON  02/24/2015] aspirin EC  81 mg Oral Daily  . [START ON 02/24/2015] atorvastatin  40 mg Oral q1800  . calcium gluconate 1 GM IV  1 g Intravenous Once  . calcium gluconate      . [START ON 02/24/2015] carvedilol  3.125 mg Oral BID WC  . dextrose  1 ampule Intravenous Once  . insulin aspart  10 Units Intravenous Once  . LORazepam      . pantoprazole (PROTONIX) IV  40 mg Intravenous Q12H  . sodium chloride  3 mL Intravenous Q12H   Continuous Infusions: . sodium chloride 10 mL/hr (02/23/15 1715)  . sodium chloride    . sodium chloride    . nitroGLYCERIN     PRN Meds:.sodium chloride, acetaminophen, acetaminophen, nitroGLYCERIN, ondansetron (ZOFRAN) IV, ondansetron (ZOFRAN) IV, sodium chloride  Allergies as of 02/23/2015  . (No Known Allergies)    No family history on file.  History   Social History  . Marital Status: Married    Spouse Name: N/A  . Number of Children: N/A  . Years of Education: N/A   Occupational History  . Not on file.   Social History Main Topics  . Smoking status: Current Every Day Smoker -- 1.50 packs/day for 44 years    Types: Cigarettes  . Smokeless tobacco: Never Used     Comment: 1 1/2 pack a day  . Alcohol Use: Yes     Comment: 10-12 beers a day (12oz)   . Drug Use: No  . Sexual Activity: Yes    Birth Control/ Protection: None   Other Topics Concern  . Not on file   Social History Narrative    Review of Systems: Unable to obtain from patient (confused) Physical Exam: Vital signs: Filed Vitals:   02/23/15 1900  BP: 150/103  Pulse: 79  Temp: 98.7  Resp: 18     General:  Lethargic, disheveled, thin, no acute distress Head: atraumatic Eyes: pupils equal and reactive, anicteric sclera ENT: oropharynx clear Neck: supple, nontender Lungs:  Coarse breath sounds  Heart:  Regular rate and rhythm; no murmurs, clicks, rubs,  or gallops. Abdomen: soft, nontender, nondistended, +BS  Rectal:  Deferred Ext: pulses intact, no edema Neuro:  confused, oriented to person not time ("2010")  GI:  Lab Results:  Recent Labs  02/23/15 1519  02/23/15 1606 02/23/15 1647 02/23/15 1925  WBC 19.8*  --   --   --  21.4*  HGB 12.3*  < > 15.3 13.3 13.3  HCT 37.1*  < > 45.0 39.0 38.9*  PLT 317  --   --   --  372  < > = values in this interval not displayed. BMET  Recent Labs  02/23/15 1519  02/23/15 1606 02/23/15 1647 02/23/15 1925  NA 138  < > 140 143 141  K 6.1*  < > 6.4* 5.9* 6.5*  CL 111  < > 116* 118* 116*  CO2 14*  --   --   --  15*  GLUCOSE 154*  < > 136* 127* 117*  BUN 107*  < > 106* 99* 108*  CREATININE  2.80*  < > 2.50* 2.20* 2.51*  CALCIUM 8.9  --   --   --  8.6*  < > = values in this interval not displayed. LFT  Recent Labs  02/23/15 1925  PROT 6.7  ALBUMIN 2.2*  AST 117*  ALT 71*  ALKPHOS 185*  BILITOT 0.5   PT/INR  Recent Labs  02/23/15 1519  LABPROT 15.3*  INR 1.19     Studies/Results: No results found.  Impression/Plan: 64 yo s/p STEMI with 3 vessel disease seen on cardiac cath who has been having coffee grounds emesis without melena or hematochezia. No evidence of varices on EGD in 2014. No further episodes of coffee grounds emesis in the hospital and no bright red blood in emesis prior to admit. Source of coffee grounds emesis likely esophagitis or gastritis and doubt peptic ulcer source. Will do an EGD in the morning to further evaluate. NPO p MN. Continue Protonix 40 mg IV Q 12 hours.    LOS: 0 days   [place] C.  02/23/2015, 8:38 PM  Pager [phone]  If no answer or after 5 PM call [phone]

## 2015-02-23 NOTE — Progress Notes (Signed)
CRITICAL VALUE ALERT  Critical value received:  Lactic acid 2.5  Date of notification:  02/23/15  Time of notification:  2200  Critical value read back:Yes.    Nurse who received alert:  Abram Sander  MD notified (1st page):  Harwani  Time of first page:  2201  MD notified (2nd page):  Time of second page:  Responding MD:  Terrence Dupont  Time MD responded:  2202

## 2015-02-23 NOTE — Consult Note (Signed)
[address]       [place] [address]             [phone]        Kashaun L [place] Medical Record #[id number] Date of Birth: Oct 29, 1950  Referring: Dr Terrence Dupont Primary Care: Terald Sleeper, PA-C  Chief Complaint:    Chief Complaint  Patient presents with  . Code STEMI    History of Present Illness:     Patient with 6-7 days of feeling bad, wife notes he had been weak and confused. He refused to go to ER so finally she called EMS   Current Activity/ Functional Status: Patient is independent with mobility/ambulation, transfers, ADL's, IADL's.   Zubrod Score: At the time of surgery this patient's most appropriate activity status/level should be described as: []     0    Normal activity, no symptoms [x]     1    Restricted in physical strenuous activity but ambulatory, able to do out light work []     2    Ambulatory and capable of self care, unable to do work activities, up and about                 more than 50%  Of the time                            []     3    Only limited self care, in bed greater than 50% of waking hours []     4    Completely disabled, no self care, confined to bed or chair []     5    Moribund  Past Medical History  Diagnosis Date  . Alcoholic   . Chronic back pain   . Upper GI bleed   . Bladder tumor 07-2014  . Hypertension     Past Surgical History  Procedure Laterality Date  . Appendectomy    . Esophagogastroduodenoscopy (egd) with propofol N/A 08/16/2013    Procedure: ESOPHAGOGASTRODUODENOSCOPY (EGD) WITH PROPOFOL Hiatus at 40cm Gastroesophageal Junction at 37cm;  Surgeon: Rogene Houston, MD;  Location: [facility];  Service: Endoscopy;  Laterality: N/A;  . Balloon dilation N/A 08/16/2013    Procedure: BALLOON DILATION to 16.58mm;  Surgeon: Rogene Houston, MD;  Location: [facility];  Service: Endoscopy;  Laterality: N/A;  . Esophageal biopsy  08/16/2013    Procedure: DISTAL ESOPHAGEAL BIOPSIES;  Surgeon: Rogene Houston,  MD;  Location: [facility];  Service: Endoscopy;;  . Transurethral resection of prostate  07-2014,08-2014,09-2014  . Transurethral resection of bladder tumor with gyrus (turbt-gyrus) N/A 12/05/2014    Procedure: TRANSURETHRAL RESECTION OF BLADDER TUMOR WITH GYRUS (TURBT-GYRUS);  Surgeon: [name], MD;  Location: [facility];  Service: Urology;  Laterality: N/A;  . Cystoscopy w/ ureteral stent placement Bilateral 12/05/2014    Procedure: CYSTOSCOPY WITH BILATERAL RETROGRADE PYELOGRAM;  Surgeon: [name], MD;  Location: [facility];  Service: Urology;  Laterality: Bilateral;  . Robot assisted laparoscopic complete cystect ileal conduit N/A 01/16/2015    Procedure: ROBOTIC ASSISTED LAPAROSCOPIC COMPLETE CYSTECT ILEAL CONDUIT/ROBOTIC ASSISTED LAPAROSCOPIC RADICAL PROSTATECTOMY;  Surgeon: [name], MD;  Location: [facility];  Service: Urology;  Laterality: N/A;  . Lymphadenectomy Bilateral 01/16/2015    Procedure: PELVIC LYMPH NODE DISSECTION;  Surgeon: [name], MD;  Location: [facility];  Service: Urology;  Laterality: Bilateral;  . Cystoscopy with injection N/A 01/16/2015  Procedure: CYSTOSCOPY WITH INJECTION OF INDOCYANINE GREEN DYE;  Surgeon: [name], MD;  Location: [facility];  Service: Urology;  Laterality: N/A;    History  Smoking status  . Current Every Day Smoker -- 1.50 packs/day for 44 years  . Types: Cigarettes  Smokeless tobacco  . Never Used    Comment: 1 1/2 pack a day    History  Alcohol Use  . Yes    Comment: 10-12 beers a day (12oz)     History   Social History  . Marital Status: Married    Spouse Name: N/A  . Number of Children: N/A  . Years of Education: N/A   Occupational History  . Not on file.   Social History Main Topics  . Smoking status: Current Every Day Smoker -- 1.50 packs/day for 44 years    Types: Cigarettes  . Smokeless tobacco: Never Used     Comment: 1 1/2 pack a day  . Alcohol Use: Yes     Comment: 10-12 beers a day (12oz)   . Drug Use: No    . Sexual Activity: Yes    Birth Control/ Protection: None   Other Topics Concern  . Not on file   Social History Narrative    No Known Allergies  Current Facility-Administered Medications  Medication Dose Route Frequency Provider Last Rate Last Dose  . 0.9 %  sodium chloride infusion   Intravenous Continuous Noemi Chapel, MD 10 mL/hr at 02/23/15 1715 10 mL/hr at 02/23/15 1715  . 0.9 %  sodium chloride infusion   Intravenous Continuous Charolette Forward, MD      . 0.9 %  sodium chloride infusion  250 mL Intravenous PRN Charolette Forward, MD      . 0.9 %  sodium chloride infusion   Intravenous Continuous Charolette Forward, MD   0  at 02/23/15 1815  . acetaminophen (TYLENOL) tablet 650 mg  650 mg Oral Q4H PRN Charolette Forward, MD      . acetaminophen (TYLENOL) tablet 650 mg  650 mg Oral Q4H PRN Charolette Forward, MD      . Derrill Memo ON 02/24/2015] aspirin EC tablet 81 mg  81 mg Oral Daily Charolette Forward, MD      . Derrill Memo ON 02/24/2015] atorvastatin (LIPITOR) tablet 40 mg  40 mg Oral q1800 Charolette Forward, MD      . calcium gluconate 1 g in sodium chloride 0.9 % 100 mL IVPB  1 g Intravenous Once Noemi Chapel, MD      . calcium gluconate 10 % injection           . [START ON 02/24/2015] carvedilol (COREG) tablet 3.125 mg  3.125 mg Oral BID WC Charolette Forward, MD      . dextrose 50 % solution 50 mL  1 ampule Intravenous Once Noemi Chapel, MD      . insulin aspart (novoLOG) injection 10 Units  10 Units Intravenous Once Noemi Chapel, MD      . LORazepam (ATIVAN) 2 MG/ML injection           . nitroGLYCERIN (NITROSTAT) SL tablet 0.4 mg  0.4 mg Sublingual Q5 Min x 3 PRN Charolette Forward, MD      . nitroGLYCERIN 50 mg in dextrose 5 % 250 mL (0.2 mg/mL) infusion  10 mcg/min Intravenous Titrated Charolette Forward, MD   10 mcg/min at 02/23/15 1845  . ondansetron (ZOFRAN) injection 4 mg  4 mg Intravenous Q6H PRN Charolette Forward, MD      . ondansetron (  ZOFRAN) injection 4 mg  4 mg Intravenous Q6H PRN Charolette Forward, MD      . pantoprazole  (PROTONIX) injection 40 mg  40 mg Intravenous Q12H Charolette Forward, MD      . sodium chloride 0.9 % injection 3 mL  3 mL Intravenous Q12H Charolette Forward, MD      . sodium chloride 0.9 % injection 3 mL  3 mL Intravenous PRN Charolette Forward, MD        Prescriptions prior to admission  Medication Sig Dispense Refill Last Dose  . Oxycodone HCl 10 MG TABS Take 10-20 mg by mouth 4 (four) times daily.   Past Week at Unknown time  . traMADol (ULTRAM) 50 MG tablet Take 50-100 mg by mouth every 6 (six) hours as needed for moderate pain or severe pain.   Past Week at Unknown time  . HYDROmorphone (DILAUDID) 4 MG tablet Take 1-2 tablets (4-8 mg total) by mouth every 4 (four) hours as needed for moderate pain or severe pain. Post-operatively (Patient not taking: Reported on 02/23/2015) 50 tablet 0 Not Taking  . lisinopril (PRINIVIL,ZESTRIL) 5 MG tablet Take 5 mg by mouth daily.   unknown  . omeprazole (PRILOSEC) 20 MG capsule Take 20 mg by mouth daily as needed (Heartburn).    unknown  . senna-docusate (SENOKOT-S) 8.6-50 MG per tablet Take 1 tablet by mouth 2 (two) times daily. While taking pain meds to prevent constipation 30 tablet 0 unknown    No family history on file.   Review of Systems:     Cardiac Review of Systems: Y or N patient is unable to give full responses to questions  Chest Pain [    ]  Resting SOB [   ] Exertional SOB  [  ]  Orthopnea [  ]   Pedal Edema [   ]    Palpitations [  ] Syncope  [  ]   Presyncope [   ]  General Review of Systems: [Y] = yes [  ]=no Constitional: recent weight change [  ]; anorexia [  ]; fatigue [  ]; nausea [  ]; night sweats [  ]; fever [  ]; or chills [  ]                                                               Dental: poor dentition[  ]; Last Dentist visit:   Eye : blurred vision [  ]; diplopia [   ]; vision changes [  ];  Amaurosis fugax[  ]; Resp: cough [  ];  wheezing[  ];  hemoptysis[  ]; shortness of breath[  ]; paroxysmal nocturnal dyspnea[  ];  dyspnea on exertion[  ]; or orthopnea[  ];  GI:  gallstones[  ], vomiting[  ];  dysphagia[  ]; melena[  ];  hematochezia [  ]; heartburn[  ];   Hx of  Colonoscopy[  ]; GU: kidney stones [  ]; hematuria[  ];   dysuria [  ];  nocturia[  ];  history of     obstruction [  ]; urinary frequency [  ]             Skin: rash, swelling[  ];, hair loss[  ];  peripheral edema[  ];  or itching[  ]; Musculosketetal: myalgias[  ];  joint swelling[  ];  joint erythema[  ];  joint pain[  ];  back pain[  ];  Heme/Lymph: bruising[  ];  bleeding[  ];  anemia[  ];  Neuro: TIA[  ];  headaches[  ];  stroke[  ];  vertigo[  ];  seizures[  ];   paresthesias[  ];  difficulty walking[  ];  Psych:depression[  ]; anxiety[  ];  Endocrine: diabetes[  ];  thyroid dysfunction[  ];  Immunizations: Flu [  ]; Pneumococcal[  ];  Other:  Physical Exam: BP 150/103 mmHg  Pulse 79  Temp(Src) 98.7 F (37.1 C) (Oral)  Resp 18  Ht 5\' 10"  (1.778 m)  Wt 161 lb (73.029 kg)  BMI 23.10 kg/m2  SpO2 100%   General appearance: cachectic, combative, delirious, distracted, mild distress and slowed mentation Head: Normocephalic, without obvious abnormality, atraumatic Neck: no adenopathy, no carotid bruit, no JVD, supple, symmetrical, trachea midline and thyroid not enlarged, symmetric, no tenderness/mass/nodules Lymph nodes: Cervical, supraclavicular, and axillary nodes normal. Resp: diminished breath sounds bibasilar Back: symmetric, no curvature. ROM normal. No CVA tenderness. Cardio: regular rate and rhythm, S1, S2 normal, no murmur, click, rub or gallop GI: soft, non-tender; bowel sounds normal; no masses,  no organomegaly Extremities: extremities normal, atraumatic, no cyanosis or edema and Homans sign is negative, no sign of DVT Neurologic: Mental status: confused, ansewers some questions not inconsistent  Diagnostic Studies & Laboratory data:     Recent Radiology Findings:   No results found.   I have independently  reviewed the above radiologic studies.  Recent Lab Findings: Lab Results  Component Value Date   WBC 21.4* 02/23/2015   HGB 13.3 02/23/2015   HCT 38.9* 02/23/2015   PLT 372 02/23/2015   GLUCOSE 127* 02/23/2015   ALT 71* 02/23/2015   AST 93* 02/23/2015   NA 143 02/23/2015   K 5.9* 02/23/2015   CL 118* 02/23/2015   CREATININE 2.20* 02/23/2015   BUN 99* 02/23/2015   CO2 14* 02/23/2015   INR 1.19 02/23/2015    No results found for: CKTOTAL, CKMB, CKMBINDEX, TROPONINI  Acute Kidney Injury (any one)  Increase in SCr by > 0.3 within 48 hours  Increase SCr to > 1.5 times baseline  Urine volume < 0.5 ml/kg/h for 6 hrs  Stage:  Risk:   1.5x increase in creatinine or GFR decrease by 25% or UOP <0.59ml/kgperhr for 6 hrs  Injury:  2x increase in creatinine or GFR decrease by 50% or UOP < 0.39ml/kgperhr for 12 hr  Failure:3X increase in creatinine or GFR decrease by 75% or UOP < 0.10ml/kgperhr for 12 hr or                anuria 12 hrs  Loss: complete loss of kidney  function for more then 4 weeks  End-stage renal disease:Complete loss of kidney function for more then 3 months    Lab Results  Component Value Date   CREATININE 2.20* 02/23/2015   CREATININE: 2.2 mg/dL ABNORMAL (02/23/15 1647) Estimated creatinine clearance - 35.9 mL/min   Cath    Mid LAD to Dist LAD lesion, 70% stenosed.  Ost 2nd Mrg to 2nd Mrg lesion, 85% stenosed.  Mid Cx lesion, 85% stenosed.  Prox RCA lesion, 20% stenosed.  Mid RCA lesion, 85% stenosed. EF 20-25%    I have independently reviewed the above  cath films and reviewed the findings with the  patient .   Assessment /  Plan:   GI Bleed Acute alcohol withdrawal? Encephalopathy unknown cause   Hyperkalemia  Acute mi with CAD Acute Renal Failure - base line 1.1 in [id number]  Tobacco abuse Bladder cancer - unknown stage Leucocytosis  Check lactic acid level  , AG 11  In current condition CABG is contraindicated due to poor medical condition  and unreasonable surgical risk and poor chance of survival  I  spent 40 minutes counseling the patient face to face and 50% or more the  time was spent in counseling and coordination of care. The total time spent in the appointment was 60 minutes.    Grace Isaac MD      [address] [place] [address] Office [phone]   Beeper [phone]  02/23/2015 8:02 PM

## 2015-02-23 NOTE — ED Provider Notes (Signed)
CSN: [id number]     Arrival date & time 02/23/15  1509 History   First MD Initiated Contact with Patient 02/23/15 1516     Chief Complaint  Patient presents with  . Code STEMI     (Consider location/radiation/quality/duration/timing/severity/associated sxs/prior Treatment) HPI Comments: The patient is a 64 year old male who has a history of bladder cancer status post resection with diverting urostomy approximately one month ago, he is a chronic daily alcoholic who drinks large volumes of alcohol everyday and has a history of upper GI bleed but endoscopy done in December 2014 revealed no signs of esophageal varices or peptic ulcers. He presents today by ambulance after a code STEMI was activated by paramedics. The call went out for upper GI bleeding with hematemesis and coffee-ground emesis. They found the patient to be tremulous, ill-appearing, complaining of abdominal discomfort and vomiting coffee-ground emesis. There was a large volume of this emesis at his bedside at his living facility. The patient has also had some chest pain on the left side of his chest, this has been intermittent, the patient is a very poor historian but it seems that there has been some combination of these symptoms over the last 4 days. He denies swelling of the legs, denies having diabetes cholesterol, denies having cardiac disease. ST elevations were seen on the initial EKG from the field, it was activated in the field  The history is provided by the patient, the EMS personnel and medical records.    Past Medical History  Diagnosis Date  . Alcoholic   . Chronic back pain   . Upper GI bleed   . Bladder tumor 07-2014  . Hypertension    Past Surgical History  Procedure Laterality Date  . Appendectomy    . Esophagogastroduodenoscopy (egd) with propofol N/A 08/16/2013    Procedure: ESOPHAGOGASTRODUODENOSCOPY (EGD) WITH PROPOFOL Hiatus at 40cm Gastroesophageal Junction at 37cm;  Surgeon: Rogene Houston, MD;   Location: [facility];  Service: Endoscopy;  Laterality: N/A;  . Balloon dilation N/A 08/16/2013    Procedure: BALLOON DILATION to 16.13mm;  Surgeon: Rogene Houston, MD;  Location: [facility];  Service: Endoscopy;  Laterality: N/A;  . Esophageal biopsy  08/16/2013    Procedure: DISTAL ESOPHAGEAL BIOPSIES;  Surgeon: Rogene Houston, MD;  Location: [facility];  Service: Endoscopy;;  . Transurethral resection of prostate  07-2014,08-2014,09-2014  . Transurethral resection of bladder tumor with gyrus (turbt-gyrus) N/A 12/05/2014    Procedure: TRANSURETHRAL RESECTION OF BLADDER TUMOR WITH GYRUS (TURBT-GYRUS);  Surgeon: [name], MD;  [facility];  Service: Urology;  Laterality: N/A;  . Cystoscopy w/ ureteral stent placement Bilateral 12/05/2014    Procedure: CYSTOSCOPY WITH BILATERAL RETROGRADE PYELOGRAM;  Surgeon: [name], MD;  Location: [facility];  Service: Urology;  Laterality: Bilateral;  . Robot assisted laparoscopic complete cystect ileal conduit N/A 01/16/2015    Procedure: ROBOTIC ASSISTED LAPAROSCOPIC COMPLETE CYSTECT ILEAL CONDUIT/ROBOTIC ASSISTED LAPAROSCOPIC RADICAL PROSTATECTOMY;  Surgeon: [name], MD;  Location: [facility];  Service: Urology;  Laterality: N/A;  . Lymphadenectomy Bilateral 01/16/2015    Procedure: PELVIC LYMPH NODE DISSECTION;  Surgeon: [name], MD;  [facility];  Service: Urology;  Laterality: Bilateral;  . Cystoscopy with injection N/A 01/16/2015    Procedure: CYSTOSCOPY WITH INJECTION OF INDOCYANINE GREEN DYE;  Surgeon: [name], MD;  [facility];  Service: Urology;  Laterality: N/A;   No family history on file. History  Substance Use Topics  . Smoking status: Current Every Day Smoker -- 1.50  packs/day for 44 years    Types: Cigarettes  . Smokeless tobacco: Never Used     Comment: 1 1/2 pack a day  . Alcohol Use: Yes     Comment: 10-12 beers a day (12oz)     Review of Systems  All other systems reviewed and are negative.     Allergies   Review of patient's allergies indicates no known allergies.  Home Medications   Prior to Admission medications   Medication Sig Start Date End Date Taking? Authorizing Provider  Oxycodone HCl 10 MG TABS Take 10-20 mg by mouth 4 (four) times daily.   Yes Historical Provider, MD  traMADol (ULTRAM) 50 MG tablet Take 50-100 mg by mouth every 6 (six) hours as needed for moderate pain or severe pain.   Yes Historical Provider, MD  HYDROmorphone (DILAUDID) 4 MG tablet Take 1-2 tablets (4-8 mg total) by mouth every 4 (four) hours as needed for moderate pain or severe pain. Post-operatively Patient not taking: Reported on 02/23/2015 01/22/15   [name], MD  lisinopril (PRINIVIL,ZESTRIL) 5 MG tablet Take 5 mg by mouth daily.    Historical Provider, MD  omeprazole (PRILOSEC) 20 MG capsule Take 20 mg by mouth daily as needed (Heartburn).     Historical Provider, MD  senna-docusate (SENOKOT-S) 8.6-50 MG per tablet Take 1 tablet by mouth 2 (two) times daily. While taking pain meds to prevent constipation 01/22/15   [name], MD   BP 147/86 mmHg  Pulse 84  Temp(Src) 98.7 F (37.1 C) (Oral)  Resp 16  Ht 5\' 10"  (1.778 m)  Wt 161 lb (73.029 kg)  BMI 23.10 kg/m2  SpO2 98% Physical Exam  Constitutional: He appears well-developed and well-nourished. He appears distressed.  HENT:  Head: Normocephalic.  Mouth/Throat: Oropharynx is clear and moist. No oropharyngeal exudate.  Dry mucous membranes  Eyes: Conjunctivae and EOM are normal. Pupils are equal, round, and reactive to light. Right eye exhibits no discharge. Left eye exhibits no discharge. No scleral icterus.  Neck: Normal range of motion. Neck supple. No JVD present. No thyromegaly present.  Cardiovascular: Normal rate, regular rhythm, normal heart sounds and intact distal pulses.  Exam reveals no gallop and no friction rub.   No murmur heard. Pulmonary/Chest: Effort normal and breath sounds normal. No respiratory distress. He has no  wheezes. He has no rales.  Abdominal: Soft. Bowel sounds are normal. He exhibits no distension and no mass. There is tenderness ( Mild midabdominal tenderness, urostomy draining clear yellow urine, no guarding or peritoneal signs, no distention).  Musculoskeletal: Normal range of motion. He exhibits no edema or tenderness.  Lymphadenopathy:    He has no cervical adenopathy.  Neurological: He is alert. Coordination normal.  The patient has bilateral upper extremity and head tremor  Skin: Skin is warm and dry. No rash noted. No erythema.  Psychiatric: He has a normal mood and affect. His behavior is normal.  Nursing note and vitals reviewed.   ED Course  Procedures (including critical care time) Labs Review Labs Reviewed  APTT - Abnormal; Notable for the following:    aPTT 22 (*)    All other components within normal limits  CBC - Abnormal; Notable for the following:    WBC 19.8 (*)    RBC 4.10 (*)    Hemoglobin 12.3 (*)    HCT 37.1 (*)    All other components within normal limits  PROTIME-INR - Abnormal; Notable for the following:    Prothrombin Time 15.3 (*)  All other components within normal limits  I-STAT TROPOININ, ED - Abnormal; Notable for the following:    Troponin i, poc 1.16 (*)    All other components within normal limits  I-STAT CHEM 8, ED - Abnormal; Notable for the following:    Potassium 6.9 (*)    Chloride 117 (*)    BUN 107 (*)    Creatinine, Ser 2.50 (*)    Glucose, Bld 151 (*)    All other components within normal limits  CBG MONITORING, ED - Abnormal; Notable for the following:    Glucose-Capillary 134 (*)    All other components within normal limits  I-STAT CHEM 8, ED - Abnormal; Notable for the following:    Potassium 6.4 (*)    Chloride 116 (*)    BUN 106 (*)    Creatinine, Ser 2.50 (*)    Glucose, Bld 136 (*)    All other components within normal limits  COMPREHENSIVE METABOLIC PANEL    Imaging Review No results found.   EKG  Interpretation   Date/Time:  Saturday February 23 2015 15:15:58 EDT Ventricular Rate:  65 PR Interval:  154 QRS Duration: 75 QT Interval:  410 QTC Calculation: 426 R Axis:   70 Text Interpretation:  Sinus rhythm Atrial premature complex Anteroseptal  infarct, old Borderline ST elevation, lateral leads STEMI activated  Abnormal ekg Since last tracing ST elevation present Confirmed by Amely Voorheis   MD, Ina Poupard ([id number]) on 02/23/2015 3:25:55 PM      MDM   Final diagnoses:  ST elevation myocardial infarction (STEMI), unspecified artery  Alcohol withdrawal, with unspecified complication  Upper GI bleeding  Acute renal failure, unspecified acute renal failure type    The patient has a resting tremor, is likely related to alcohol withdrawal, his heart rate is 65, his EKG shows ST elevation in leads V4 and V5 consistent with an acute STEMI, the patient has multiple things going on including what appears to be recurrent upper GI bleeding, abdominal pain, appear severely dehydrated and by history is clinically dehydrated as well. Ativan given, tremors have resolved, IV fluids ordered, cardiology contacted immediately upon reception of the EKG, will hold anticoagulation given GI bleeding  D/w Dr. Terrence Dupont at 3:30 PM - will comee to see pt in the ED. Fluids given prehospital 1L No ASA given prehospital  Labs have resulted showing multiple abnormalities that are very concerning including  #1 hyperkalemia, potassium of 6.9  *(calcium gluconate, sodium bicarbonate, insulin and D50) #2 acute renal failure , creatinine 2.5 *( fluids given prehospital and on arrival, associated uremia, this is likely prerenal azotemia) #3 troponin elevated over 1 *(anticoagulation held secondary to history of GI bleed though hemoglobin appears normal, Dr. Terrence Dupont has seen pt and will take to cath lab) #4 metabolic acidosis  Filed Vitals:   02/23/15 1514 02/23/15 1515 02/23/15 1545  BP: 160/94 131/98 147/86  Pulse: 65  84   Temp: 98.7 F (37.1 C)    TempSrc: Oral    Resp: 20 21 16   Height: 5\' 10"  (1.778 m)    Weight: 161 lb (73.029 kg)    SpO2: 99%  98%   Meds given in ED:  Medications  LORazepam (ATIVAN) 2 MG/ML injection (not administered)  0.9 %  sodium chloride infusion (10 mL/hr Intravenous New Bag/Given 02/23/15 1526)  octreotide (SANDOSTATIN) 2 mcg/mL load via infusion 50 mcg (not administered)    And  octreotide (SANDOSTATIN) 500 mcg in sodium chloride 0.9 % 250 mL (2 mcg/mL) infusion (not  administered)  famotidine (PEPCID) IVPB 20 mg premix (20 mg Intravenous New Bag/Given 02/23/15 1547)  dextrose 50 % solution 50 mL (not administered)  insulin aspart (novoLOG) injection 10 Units (not administered)  calcium gluconate 1 g in sodium chloride 0.9 % 100 mL IVPB ( Intravenous Automatically Held 02/23/15 1615)  calcium gluconate 10 % injection (not administered)  calcium gluconate inj 10% (1 g) URGENT USE ONLY! (not administered)  LORazepam (ATIVAN) injection 2 mg (2 mg Intravenous Given 02/23/15 1515)  pantoprazole (PROTONIX) injection 40 mg (40 mg Intravenous Given 02/23/15 1545)  sodium chloride 0.9 % bolus 1,000 mL (1,000 mLs Intravenous New Bag/Given 02/23/15 1600)  sodium bicarbonate injection 50 mEq (50 mEq Intravenous Given 02/23/15 1605)    CRITICAL CARE Performed by: Noemi Chapel D Total critical care time: 35 Critical care time was exclusive of separately billable procedures and treating other patients. Critical care was necessary to treat or prevent imminent or life-threatening deterioration. Critical care was time spent personally by me on the following activities: development of treatment plan with patient and/or surrogate as well as nursing, discussions with consultants, evaluation of patient's response to treatment, examination of patient, obtaining history from patient or surrogate, ordering and performing treatments and interventions, ordering and review of laboratory studies, ordering  and review of radiographic studies, pulse oximetry and re-evaluation of patient's condition.     Noemi Chapel, MD 02/23/15 [phone]

## 2015-02-23 NOTE — Progress Notes (Signed)
CRITICAL VALUE ALERT  Critical value received:  K 6.5, Troponin 9.19  Date of notification:  02/23/15  Time of notification:  2032  Critical value read back:Yes.    Nurse who received alert:  Abram Sander  MD notified (1st page):  Harwani  Time of first page:  2034  MD notified (2nd page):  Time of second page:  Responding MD:  Terrence Dupont  Time MD responded:  2035

## 2015-02-23 NOTE — ED Notes (Signed)
Pt's wife at bedside, all pt's belongings and 2 bottles of medications given to wife

## 2015-02-23 NOTE — H&P (Signed)
Darren Carr is an 64 y.o. male.   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 On the way started having retrosternal chest pain EKG done on the field showed normal sinus rhythm with possible old anteroseptal wall MI and ST elevation in lateral leads and was noted to have elevated troponin of 1.16. Patient was also noted to have markedly elevated creatinine of 2.5 and the above 100.  Code STEMI was called because of new EKG changes. He  Past Medical History  Diagnosis Date  . Alcoholic   . Chronic back pain   . Upper GI bleed   . Bladder tumor 07-2014  . Hypertension     Past Surgical History  Procedure Laterality Date  . Appendectomy    . Esophagogastroduodenoscopy (egd) with propofol N/A 08/16/2013    Procedure: ESOPHAGOGASTRODUODENOSCOPY (EGD) WITH PROPOFOL Hiatus at 40cm Gastroesophageal Junction at 37cm;  Surgeon: Rogene Houston, MD;  Location: [facility];  Service: Endoscopy;  Laterality: N/A;  . Balloon dilation N/A 08/16/2013    Procedure: BALLOON DILATION to 16.66mm;  Surgeon: Rogene Houston, MD;  Location: [facility];  Service: Endoscopy;  Laterality: N/A;  . Esophageal biopsy  08/16/2013    Procedure: DISTAL ESOPHAGEAL BIOPSIES;  Surgeon: Rogene Houston, MD;  Location: [facility];  Service: Endoscopy;;  . Transurethral resection of prostate  07-2014,08-2014,09-2014  . Transurethral resection of bladder tumor with gyrus (turbt-gyrus) N/A 12/05/2014    Procedure: TRANSURETHRAL RESECTION OF BLADDER TUMOR WITH GYRUS  (TURBT-GYRUS);  Surgeon: [name], MD;  Location: [facility];  Service: Urology;  Laterality: N/A;  . Cystoscopy w/ ureteral stent placement Bilateral 12/05/2014    Procedure: CYSTOSCOPY WITH BILATERAL RETROGRADE PYELOGRAM;  Surgeon: [name], MD;  Location: [facility];  Service: Urology;  Laterality: Bilateral;  . Robot assisted laparoscopic complete cystect ileal conduit N/A 01/16/2015    Procedure: ROBOTIC ASSISTED LAPAROSCOPIC COMPLETE CYSTECT ILEAL CONDUIT/ROBOTIC ASSISTED LAPAROSCOPIC RADICAL PROSTATECTOMY;  Surgeon: [name], MD;  Location: [facility];  Service: Urology;  Laterality: N/A;  . Lymphadenectomy Bilateral 01/16/2015    Procedure: PELVIC LYMPH NODE DISSECTION;  Surgeon: [name], MD;  Location: [facility];  Service: Urology;  Laterality: Bilateral;  . Cystoscopy with injection N/A 01/16/2015    Procedure: CYSTOSCOPY WITH INJECTION OF INDOCYANINE GREEN DYE;  Surgeon: [name], MD;  Location: [facility];  Service: Urology;  Laterality: N/A;    No family history on file. Social History:  reports that he has been smoking Cigarettes.  He has a 66 pack-year smoking history. He has never used smokeless tobacco. He reports that he drinks alcohol. He reports that he does not use illicit drugs.  Allergies: No Known Allergies  Medications Prior to Admission  Medication Sig Dispense Refill  . Oxycodone HCl 10 MG TABS Take 10-20 mg by mouth 4 (four) times daily.    . traMADol (ULTRAM) 50 MG tablet Take 50-100 mg by mouth every 6 (six) hours as needed for moderate pain or severe pain.    Marland Kitchen HYDROmorphone (DILAUDID) 4 MG tablet Take 1-2 tablets (4-8 mg total) by mouth every 4 (four) hours  as needed for moderate pain or severe pain. Post-operatively (Patient not taking: Reported on 02/23/2015) 50 tablet 0  . lisinopril (PRINIVIL,ZESTRIL) 5 MG tablet Take 5 mg by mouth daily.    Marland Kitchen omeprazole (PRILOSEC) 20 MG capsule Take 20 mg by mouth daily as needed (Heartburn).     . senna-docusate  (SENOKOT-S) 8.6-50 MG per tablet Take 1 tablet by mouth 2 (two) times daily. While taking pain meds to prevent constipation 30 tablet 0    Results for orders placed or performed during the hospital encounter of 02/23/15 (from the past 48 hour(s))  APTT     Status: Abnormal   Collection Time: 02/23/15  3:19 PM  Result Value Ref Range   aPTT 22 (L) 24 - 37 seconds  CBC     Status: Abnormal   Collection Time: 02/23/15  3:19 PM  Result Value Ref Range   WBC 19.8 (H) 4.0 - 10.5 K/uL   RBC 4.10 (L) 4.22 - 5.81 MIL/uL   Hemoglobin 12.3 (L) 13.0 - 17.0 g/dL   HCT 37.1 (L) 39.0 - 52.0 %   MCV 90.5 78.0 - 100.0 fL   MCH 30.0 26.0 - 34.0 pg   MCHC 33.2 30.0 - 36.0 g/dL   RDW 15.1 11.5 - 15.5 %   Platelets 317 150 - 400 K/uL  Comprehensive metabolic panel     Status: Abnormal   Collection Time: 02/23/15  3:19 PM  Result Value Ref Range   Sodium 138 135 - 145 mmol/L   Potassium 6.1 (HH) 3.5 - 5.1 mmol/L    Comment: REPEATED TO VERIFY NO VISIBLE HEMOLYSIS QUESTIONABLE RESULTS, RECOMMEND RECOLLECT TO VERIFY CRITICAL RESULT CALLED TO, READ BACK BY AND VERIFIED WITH: RN MITCHELL,A AT [id number] MARTINB    Chloride 111 101 - 111 mmol/L   CO2 14 (L) 22 - 32 mmol/L   Glucose, Bld 154 (H) 65 - 99 mg/dL   BUN 107 (H) 6 - 20 mg/dL   Creatinine, Ser 2.80 (H) 0.61 - 1.24 mg/dL   Calcium 8.9 8.9 - 10.3 mg/dL   Total Protein 7.5 6.5 - 8.1 g/dL   Albumin 2.3 (L) 3.5 - 5.0 g/dL   AST 93 (H) 15 - 41 U/L   ALT 71 (H) 17 - 63 U/L   Alkaline Phosphatase 204 (H) 38 - 126 U/L   Total Bilirubin 0.6 0.3 - 1.2 mg/dL   GFR calc non Af Amer 23 (L) >60 mL/min   GFR calc Af Amer 26 (L) >60 mL/min    Comment: (NOTE) The eGFR has been calculated using the CKD EPI equation. This calculation has not been validated in all clinical situations. eGFR's persistently <60 mL/min signify possible Chronic Kidney Disease.    Anion gap 13 5 - 15  Protime-INR     Status: Abnormal   Collection Time: 02/23/15  3:19 PM   Result Value Ref Range   Prothrombin Time 15.3 (H) 11.6 - 15.2 seconds   INR 1.19 0.00 - 1.49  CBG monitoring, ED     Status: Abnormal   Collection Time: 02/23/15  3:26 PM  Result Value Ref Range   Glucose-Capillary 134 (H) 65 - 99 mg/dL  I-Stat Troponin, ED (not at [facility], [facility])     Status: Abnormal   Collection Time: 02/23/15  3:41 PM  Result Value Ref Range   Troponin i, poc 1.16 (HH) 0.00 - 0.08 ng/mL   Comment NOTIFIED PHYSICIAN    Comment 3  Comment: Due to the release kinetics of cTnI, a negative result within the first hours of the onset of symptoms does not rule out myocardial infarction with certainty. If myocardial infarction is still suspected, repeat the test at appropriate intervals.   I-stat chem 8, ed     Status: Abnormal   Collection Time: 02/23/15  3:42 PM  Result Value Ref Range   Sodium 139 135 - 145 mmol/L   Potassium 6.9 (HH) 3.5 - 5.1 mmol/L   Chloride 117 (H) 101 - 111 mmol/L   BUN 107 (H) 6 - 20 mg/dL   Creatinine, Ser 2.50 (H) 0.61 - 1.24 mg/dL   Glucose, Bld 151 (H) 65 - 99 mg/dL   Calcium, Ion 1.17 1.13 - 1.30 mmol/L   TCO2 13 0 - 100 mmol/L   Hemoglobin 15.6 13.0 - 17.0 g/dL   HCT 46.0 39.0 - 52.0 %   Comment NOTIFIED PHYSICIAN   I-stat chem 8, ed     Status: Abnormal   Collection Time: 02/23/15  4:06 PM  Result Value Ref Range   Sodium 140 135 - 145 mmol/L   Potassium 6.4 (HH) 3.5 - 5.1 mmol/L   Chloride 116 (H) 101 - 111 mmol/L   BUN 106 (H) 6 - 20 mg/dL   Creatinine, Ser 2.50 (H) 0.61 - 1.24 mg/dL   Glucose, Bld 136 (H) 65 - 99 mg/dL   Calcium, Ion 1.23 1.13 - 1.30 mmol/L   TCO2 14 0 - 100 mmol/L   Hemoglobin 15.3 13.0 - 17.0 g/dL   HCT 45.0 39.0 - 52.0 %   Comment NOTIFIED PHYSICIAN   I-STAT, chem 8     Status: Abnormal   Collection Time: 02/23/15  4:47 PM  Result Value Ref Range   Sodium 143 135 - 145 mmol/L   Potassium 5.9 (H) 3.5 - 5.1 mmol/L   Chloride 118 (H) 101 - 111 mmol/L   BUN 99 (H) 6 - 20 mg/dL   Creatinine,  Ser 2.20 (H) 0.61 - 1.24 mg/dL   Glucose, Bld 127 (H) 65 - 99 mg/dL   Calcium, Ion 1.30 1.13 - 1.30 mmol/L   TCO2 13 0 - 100 mmol/L   Hemoglobin 13.3 13.0 - 17.0 g/dL   HCT 39.0 39.0 - 52.0 %   No results found.  Review of Systems  Unable to perform ROS: acuity of condition    Blood pressure 148/94, pulse 82, temperature 98.7 F (37.1 C), temperature source Oral, resp. rate 14, height $RemoveBe'5\' 10"'WJHQkVlHO$  (1.778 m), weight 73.029 kg (161 lb), SpO2 100 %. Physical Exam  HENT:  Head: Normocephalic and atraumatic.  Eyes: Conjunctivae are normal. Pupils are equal, round, and reactive to light. Left eye exhibits no discharge.  Neck: Normal range of motion. Neck supple. No JVD present. No tracheal deviation present. No thyromegaly present.  Cardiovascular:  Regular rate and rhythm S1-S2 soft there is soft systolic murmur  Respiratory: Effort normal and breath sounds normal.  GI: Soft. Bowel sounds are normal. He exhibits no distension. There is no tenderness.  Musculoskeletal: He exhibits no edema or tenderness.  Neurological:  Awake but drowsy and responds to verbal commands by saying yes or no.     Assessment/Plan Possible acute lateral wall MI Anteroseptal wall MI age undetermined Acute upper GI bleed Hypertension Hypercholesteremia History of Ca of bladder status post resection and diverging urostomy EtOH abuse Dehydration Acute renal injury History of esophageal stricture status post irritation in the past Plan Discussed with patient's family at length regarding  various options of treatment i.e. medical versus emergency left cath possible PTCA stenting its risk and benefits i.e. death MI stroke need for emergency CABG local vascular complications worsening renal function and bleeding etc. and consents for PCI. Charolette Forward 02/23/2015, 5:31 PM

## 2015-02-23 NOTE — Progress Notes (Signed)
ANTIBIOTIC CONSULT NOTE - INITIAL  Pharmacy Consult for Vancomycin/Zosyn  Indication: rule out pneumonia  No Known Allergies  Patient Measurements: Height: 5\' 10"  (177.8 cm) Weight: 161 lb (73.029 kg) IBW/kg (Calculated) : 73  Vital Signs: Temp: 98.7 F (37.1 C) (06/11 2000) Temp Source: Oral (06/11 2000) BP: 149/85 mmHg (06/11 2200) Pulse Rate: 83 (06/11 2200) Intake/Output from previous day:   Intake/Output from this shift: Total I/O In: 234 [I.V.:234] Out: -   Labs:  Recent Labs  02/23/15 1519  02/23/15 1606 02/23/15 1647 02/23/15 1925  WBC 19.8*  --   --   --  21.4*  HGB 12.3*  < > 15.3 13.3 13.3  PLT 317  --   --   --  372  CREATININE 2.80*  < > 2.50* 2.20* 2.51*  < > = values in this interval not displayed. Estimated Creatinine Clearance: 31.5 mL/min (by C-G formula based on Cr of 2.51). No results for input(s): VANCOTROUGH, VANCOPEAK, VANCORANDOM, GENTTROUGH, GENTPEAK, GENTRANDOM, TOBRATROUGH, TOBRAPEAK, TOBRARND, AMIKACINPEAK, AMIKACINTROU, AMIKACIN in the last 72 hours.   Microbiology: Recent Results (from the past 720 hour(s))  MRSA PCR Screening     Status: None   Collection Time: 02/23/15  5:58 PM  Result Value Ref Range Status   MRSA by PCR NEGATIVE NEGATIVE Final    Comment:        The GeneXpert MRSA Assay (FDA approved for NASAL specimens only), is one component of a comprehensive MRSA colonization surveillance program. It is not intended to diagnose MRSA infection nor to guide or monitor treatment for MRSA infections.     Medical History: Past Medical History  Diagnosis Date  . Alcoholic   . Chronic back pain   . Upper GI bleed   . Bladder tumor 07-2014  . Hypertension     Medications:  Prescriptions prior to admission  Medication Sig Dispense Refill Last Dose  . Oxycodone HCl 10 MG TABS Take 10-20 mg by mouth 4 (four) times daily.   Past Week at Unknown time  . traMADol (ULTRAM) 50 MG tablet Take 50-100 mg by mouth every 6  (six) hours as needed for moderate pain or severe pain.   Past Week at Unknown time  . HYDROmorphone (DILAUDID) 4 MG tablet Take 1-2 tablets (4-8 mg total) by mouth every 4 (four) hours as needed for moderate pain or severe pain. Post-operatively (Patient not taking: Reported on 02/23/2015) 50 tablet 0 Not Taking  . lisinopril (PRINIVIL,ZESTRIL) 5 MG tablet Take 5 mg by mouth daily.   unknown  . omeprazole (PRILOSEC) 20 MG capsule Take 20 mg by mouth daily as needed (Heartburn).    unknown  . senna-docusate (SENOKOT-S) 8.6-50 MG per tablet Take 1 tablet by mouth 2 (two) times daily. While taking pain meds to prevent constipation 30 tablet 0 unknown   Assessment: 64 yo M admitted 02/23/2015  with STEMI, coffee ground emesis after recent bladder removal for cancer. Pharmacy consulted to dose vancomycin and zosyn for possible pneumonia  ID: emipric aspiration PNA, afeb, wbc elevated 6/11 Vanc 6/ 11 Zosyn  Goal of Therapy:  Vancomycin trough level 15-20 mcg/ml  Plan:  Vancomycin 1g IV q24h Zosyn 3.375g IV q8h infuse over 4h Follow up SCr, UOP, cultures, clinical course and adjust as clinically indicated.  Thank you for allowing pharmacy to be a part of this patients care team.  Rowe Robert Pharm.D., BCPS, AQ-Cardiology Clinical Pharmacist 02/23/2015 10:26 PM Pager: [phone] Phone: [phone]

## 2015-02-23 NOTE — ED Notes (Signed)
Dr Sabra Heck given a copy of troponin results 1.16

## 2015-02-23 NOTE — ED Notes (Signed)
Consent signed for cath lab by wife.  Pt to cath lab at this time.

## 2015-02-23 NOTE — ED Notes (Signed)
Cardiologist at bedside.  

## 2015-02-23 NOTE — ED Notes (Signed)
Pt also st's he has had abd pain x's 5 days with nausea and vomiting

## 2015-02-23 NOTE — ED Notes (Signed)
Dr Sabra Heck given a copy of chem 8 results

## 2015-02-23 NOTE — Progress Notes (Signed)
Chaplain responded to code stemi and introduced herself to pt family. Chaplain offered beverages, emotional support, and prayer. Chaplain escorted pt family to cath lab waiting area. Page chaplain as needed.    02/23/15 1600  Clinical Encounter Type  Visited With Family  Visit Type Initial;Spiritual support;ED  Spiritual Encounters  Spiritual Needs Emotional;Prayer  Stress Factors  Family Stress Factors Lack of knowledge;Health changes  Rianne Degraaf, Epifanio Lesches 02/23/2015 4:28 PM

## 2015-02-24 ENCOUNTER — Encounter (HOSPITAL_COMMUNITY)
Admission: EM | Disposition: A | Discharge status: Home / Self Care | Payer: Self-pay | Source: Home / Self Care | Attending: Cardiology

## 2015-02-24 ENCOUNTER — Encounter (HOSPITAL_COMMUNITY): Payer: Self-pay

## 2015-02-24 DIAGNOSIS — K920 Hematemesis: Secondary | ICD-10-CM | POA: Diagnosis present

## 2015-02-24 HISTORY — PX: ESOPHAGOGASTRODUODENOSCOPY: SHX5428

## 2015-02-24 LAB — CBC
HCT: 42.5 % (ref 39.0–52.0)
HEMATOCRIT: 37.8 % — AB (ref 39.0–52.0)
Hemoglobin: 12.9 g/dL — ABNORMAL LOW (ref 13.0–17.0)
Hemoglobin: 14.4 g/dL (ref 13.0–17.0)
MCH: 31 pg (ref 26.0–34.0)
MCH: 30.7 pg (ref 26.0–34.0)
MCHC: 34.1 g/dL (ref 30.0–36.0)
MCHC: 33.9 g/dL (ref 30.0–36.0)
MCV: 90.9 fL (ref 78.0–100.0)
MCV: 90.6 fL (ref 78.0–100.0)
PLATELETS: 319 10*3/uL (ref 150–400)
Platelets: 376 10*3/uL (ref 150–400)
RBC: 4.16 MIL/uL — ABNORMAL LOW (ref 4.22–5.81)
RBC: 4.69 MIL/uL (ref 4.22–5.81)
RDW: 15.3 % (ref 11.5–15.5)
RDW: 15.3 % (ref 11.5–15.5)
WBC: 22 10*3/uL — ABNORMAL HIGH (ref 4.0–10.5)
WBC: 18.3 10*3/uL — AB (ref 4.0–10.5)

## 2015-02-24 LAB — BASIC METABOLIC PANEL
Anion gap: 14 (ref 5–15)
Anion gap: 11 (ref 5–15)
BUN: 95 mg/dL — AB (ref 6–20)
BUN: 89 mg/dL — ABNORMAL HIGH (ref 6–20)
CALCIUM: 8.5 mg/dL — AB (ref 8.9–10.3)
CHLORIDE: 116 mmol/L — AB (ref 101–111)
CO2: 17 mmol/L — ABNORMAL LOW (ref 22–32)
CO2: 16 mmol/L — ABNORMAL LOW (ref 22–32)
Calcium: 8.4 mg/dL — ABNORMAL LOW (ref 8.9–10.3)
Chloride: 122 mmol/L — ABNORMAL HIGH (ref 101–111)
Creatinine, Ser: 2.58 mg/dL — ABNORMAL HIGH (ref 0.61–1.24)
Creatinine, Ser: 2.56 mg/dL — ABNORMAL HIGH (ref 0.61–1.24)
GFR calc Af Amer: 29 mL/min — ABNORMAL LOW (ref >60)
GFR calc Af Amer: 29 mL/min — ABNORMAL LOW (ref >60)
GFR calc non Af Amer: 25 mL/min — ABNORMAL LOW (ref >60)
GFR calc non Af Amer: 25 mL/min — ABNORMAL LOW (ref >60)
GLUCOSE: 115 mg/dL — AB (ref 65–99)
Glucose, Bld: 101 mg/dL — ABNORMAL HIGH (ref 65–99)
POTASSIUM: 5.5 mmol/L — AB (ref 3.5–5.1)
POTASSIUM: 4.8 mmol/L (ref 3.5–5.1)
SODIUM: 149 mmol/L — AB (ref 135–145)
Sodium: 147 mmol/L — ABNORMAL HIGH (ref 135–145)

## 2015-02-24 LAB — LIPID PANEL
Cholesterol: 161 mg/dL (ref 0–200)
HDL: 20 mg/dL — ABNORMAL LOW (ref >40)
LDL Cholesterol: 115 mg/dL — ABNORMAL HIGH (ref 0–99)
Total CHOL/HDL Ratio: 8.1 RATIO
Triglycerides: 132 mg/dL (ref <150)
VLDL: 26 mg/dL (ref 0–40)

## 2015-02-24 LAB — TROPONIN I
TROPONIN I: 16.25 ng/mL — AB (ref <0.031)
TROPONIN I: 20.02 ng/mL — AB (ref <0.031)
TROPONIN I: 25.53 ng/mL — AB (ref <0.031)

## 2015-02-24 LAB — LACTIC ACID, PLASMA: Lactic Acid, Venous: 1.5 mmol/L (ref 0.5–2.0)

## 2015-02-24 SURGERY — EGD (ESOPHAGOGASTRODUODENOSCOPY)
Anesthesia: Moderate Sedation

## 2015-02-24 MED ORDER — MIDAZOLAM HCL 10 MG/2ML IJ SOLN
INTRAMUSCULAR | Status: DC | PRN
  Administered 2015-02-24 (×2): 2 mg via INTRAVENOUS

## 2015-02-24 MED ORDER — MIDAZOLAM HCL 5 MG/ML IJ SOLN
INTRAMUSCULAR | Status: AC
  Filled 2015-02-24: qty 2

## 2015-02-24 MED ORDER — FENTANYL CITRATE (PF) 100 MCG/2ML IJ SOLN
INTRAMUSCULAR | Status: DC | PRN
  Administered 2015-02-24 (×2): 25 ug via INTRAVENOUS

## 2015-02-24 MED ORDER — SODIUM BICARBONATE 650 MG PO TABS
650.0000 mg | ORAL_TABLET | Freq: Two times a day (BID) | ORAL | Status: DC
  Administered 2015-02-24 – 2015-03-02 (×13): 650 mg via ORAL
  Filled 2015-02-24 (×14): qty 1

## 2015-02-24 MED ORDER — CARVEDILOL 6.25 MG PO TABS
6.2500 mg | ORAL_TABLET | Freq: Two times a day (BID) | ORAL | Status: DC
  Filled 2015-02-24 (×2): qty 1

## 2015-02-24 MED ORDER — LORAZEPAM 2 MG/ML IJ SOLN
1.0000 mg | Freq: Four times a day (QID) | INTRAMUSCULAR | Status: DC | PRN
  Administered 2015-02-24 (×2): 1 mg via INTRAVENOUS
  Filled 2015-02-24 (×2): qty 1

## 2015-02-24 MED ORDER — CARVEDILOL 6.25 MG PO TABS
6.2500 mg | ORAL_TABLET | Freq: Two times a day (BID) | ORAL | Status: DC
  Administered 2015-02-24 – 2015-03-07 (×22): 6.25 mg via ORAL
  Filled 2015-02-24 (×25): qty 1

## 2015-02-24 MED ORDER — FENTANYL CITRATE (PF) 100 MCG/2ML IJ SOLN
INTRAMUSCULAR | Status: AC
  Filled 2015-02-24: qty 2

## 2015-02-24 MED ORDER — HALOPERIDOL LACTATE 5 MG/ML IJ SOLN
1.0000 mg | Freq: Four times a day (QID) | INTRAMUSCULAR | Status: DC | PRN
  Administered 2015-02-24 – 2015-02-25 (×2): 1 mg via INTRAVENOUS
  Filled 2015-02-24 (×2): qty 1

## 2015-02-24 MED ORDER — SODIUM CHLORIDE 0.45 % IV SOLN
INTRAVENOUS | Status: DC
  Administered 2015-02-24 (×2): via INTRAVENOUS

## 2015-02-24 MED ORDER — DIPHENHYDRAMINE HCL 50 MG/ML IJ SOLN
INTRAMUSCULAR | Status: AC
Start: 2015-02-24 — End: 2015-02-24
  Filled 2015-02-24: qty 1

## 2015-02-24 MED ORDER — DIPHENHYDRAMINE HCL 50 MG/ML IJ SOLN
INTRAMUSCULAR | Status: DC | PRN
  Administered 2015-02-24: 25 mg via INTRAVENOUS

## 2015-02-24 NOTE — Progress Notes (Signed)
Subjective:  More alert and awake today denies any chest pain complains of vague abdominal pain no further hematemesis  Objective:  Vital Signs in the last 24 hours: Temp:  [98.3 F (36.8 C)-98.7 F (37.1 C)] 98.5 F (36.9 C) (06/12 0402) Pulse Rate:  [0-275] 92 (06/12 0200) Resp:  [0-33] 13 (06/12 0700) BP: (119-160)/(75-111) 143/92 mmHg (06/12 0700) SpO2:  [0 %-100 %] 100 % (06/12 0400) Weight:  [73.029 kg (161 lb)] 73.029 kg (161 lb) (06/11 1514)  Intake/Output from previous day: 06/11 0701 - 06/12 0700 In: 2767.6 [I.V.:2517.6; IV Piggyback:250] Out: 2425 [Urine:2425] Intake/Output from this shift:    Physical Exam: Neck: no adenopathy, no carotid bruit, no JVD and supple, symmetrical, trachea midline Lungs: clear to auscultation bilaterally Heart: regular rate and rhythm, S1, S2 normal and Soft systolic murmur noted Abdomen: soft, non-tender; bowel sounds normal; no masses,  no organomegaly Extremities: extremities normal, atraumatic, no cyanosis or edema Left groin dressing dry Lab Results:  Recent Labs  02/23/15 1925 02/24/15 0630  WBC 21.4* 22.0*  HGB 13.3 12.9*  PLT 372 376    Recent Labs  02/23/15 1925 02/24/15 0630  NA 141 147*  K 6.5* 5.5*  CL 116* 116*  CO2 15* 17*  GLUCOSE 117* 115*  BUN 108* 95*  CREATININE 2.51* 2.58*    Recent Labs  02/24/15 0109 02/24/15 0630  TROPONINI 16.25* 20.02*   Hepatic Function Panel  Recent Labs  02/23/15 1925  PROT 6.7  ALBUMIN 2.2*  AST 117*  ALT 71*  ALKPHOS 185*  BILITOT 0.5    Recent Labs  02/24/15 0109  CHOL 161   No results for input(s): PROTIME in the last 72 hours.  Imaging: Imaging results have been reviewed and Dg Chest Port 1 View  02/23/2015   CLINICAL DATA:  Chest pain and vomiting. Recent cardiac catheterization.  EXAM: PORTABLE CHEST - 1 VIEW  COMPARISON:  12/03/2014  FINDINGS: A single AP portable view of the chest demonstrates no focal airspace consolidation or alveolar edema.  The lungs are grossly clear. There is no large effusion or pneumothorax. Cardiac and mediastinal contours appear unremarkable.  IMPRESSION: No active disease.   Electronically Signed   By: Andreas Newport M.D.   On: 02/23/2015 21:00    Cardiac Studies:  Assessment/Plan:  Evolving acute anterolateral wall MI status post left cath Multivessel CAD Marked leukocytosis etiology unclear Acute upper GI bleed Hypertension Hypercholesteremia History of Ca of bladder status post resection and diverging urostomy EtOH abuse Dehydration Acute renal injury History of esophageal stricture status post irritation in the past Plan Continue present management Check blood cultures Continue empiric Vanco and Zosyn for now If patient felt deemed inappropriate for CABG Will consider a staged PCI once cleared from GI point of view Continue slow hydration Check labs in a.m.  LOS: 1 day    Darren Carr 02/24/2015, 8:06 AM

## 2015-02-24 NOTE — H&P (View-Only) (Signed)
Referring Provider: Dr. Terrence Dupont Primary Care Physician:  Terald Sleeper, PA-C Primary Gastroenterologist:  Althia Forts  Reason for Consultation:  Coffee grounds emesis  HPI: Darren Carr is a 64 y.o. male with a history of alcohol abuse (reports no alcohol in the past 5 days) who has been having abdominal pain and coffee grounds emesis and then was found to have ST elevation on ECG and was taken to the cath lab. Has 3 vessel disease on cath. He is unable to tell me how much coffee grounds emesis he has had and no family present at this time. Not able to tell me about his abdominal pain stating that he did not have any pain. Denies abdominal pain, melena, or hematochezia. +Weakness. History of an esophageal stricture with dilation in 2014 by Dr. Laural Golden. Antral gastritis and a small hiatal hernia were noted. No varices seen on that EGD. Denies NSAIDs. Hgb 12.3.   Past Medical History  Diagnosis Date  . Alcoholic   . Chronic back pain   . Upper GI bleed   . Bladder tumor 07-2014  . Hypertension     Past Surgical History  Procedure Laterality Date  . Appendectomy    . Esophagogastroduodenoscopy (egd) with propofol N/A 08/16/2013    Procedure: ESOPHAGOGASTRODUODENOSCOPY (EGD) WITH PROPOFOL Hiatus at 40cm Gastroesophageal Junction at 37cm;  Surgeon: Rogene Houston, MD;  Location: [facility];  Service: Endoscopy;  Laterality: N/A;  . Balloon dilation N/A 08/16/2013    Procedure: BALLOON DILATION to 16.40mm;  Surgeon: Rogene Houston, MD;  Location: [facility];  Service: Endoscopy;  Laterality: N/A;  . Esophageal biopsy  08/16/2013    Procedure: DISTAL ESOPHAGEAL BIOPSIES;  Surgeon: Rogene Houston, MD;  Location: [facility];  Service: Endoscopy;;  . Transurethral resection of prostate  07-2014,08-2014,09-2014  . Transurethral resection of bladder tumor with gyrus (turbt-gyrus) N/A 12/05/2014    Procedure: TRANSURETHRAL RESECTION OF BLADDER TUMOR WITH GYRUS (TURBT-GYRUS);  Surgeon: [name], MD;   Location: [facility];  Service: Urology;  Laterality: N/A;  . Cystoscopy w/ ureteral stent placement Bilateral 12/05/2014    Procedure: CYSTOSCOPY WITH BILATERAL RETROGRADE PYELOGRAM;  Surgeon: [name], MD;  Location: [facility];  Service: Urology;  Laterality: Bilateral;  . Robot assisted laparoscopic complete cystect ileal conduit N/A 01/16/2015    Procedure: ROBOTIC ASSISTED LAPAROSCOPIC COMPLETE CYSTECT ILEAL CONDUIT/ROBOTIC ASSISTED LAPAROSCOPIC RADICAL PROSTATECTOMY;  Surgeon: [name], MD;  Location: [facility];  Service: Urology;  Laterality: N/A;  . Lymphadenectomy Bilateral 01/16/2015    Procedure: PELVIC LYMPH NODE DISSECTION;  Surgeon: [name], MD;  Location: [facility];  Service: Urology;  Laterality: Bilateral;  . Cystoscopy with injection N/A 01/16/2015    Procedure: CYSTOSCOPY WITH INJECTION OF INDOCYANINE GREEN DYE;  Surgeon: [name], MD;  Location: [facility];  Service: Urology;  Laterality: N/A;    Prior to Admission medications   Medication Sig Start Date End Date Taking? Authorizing Provider  Oxycodone HCl 10 MG TABS Take 10-20 mg by mouth 4 (four) times daily.   Yes Historical Provider, MD  traMADol (ULTRAM) 50 MG tablet Take 50-100 mg by mouth every 6 (six) hours as needed for moderate pain or severe pain.   Yes Historical Provider, MD  HYDROmorphone (DILAUDID) 4 MG tablet Take 1-2 tablets (4-8 mg total) by mouth every 4 (four) hours as needed for moderate pain or severe pain. Post-operatively Patient not taking: Reported on 02/23/2015 01/22/15   [name], MD  lisinopril (PRINIVIL,ZESTRIL) 5 MG tablet Take 5 mg by  mouth daily.    Historical Provider, MD  omeprazole (PRILOSEC) 20 MG capsule Take 20 mg by mouth daily as needed (Heartburn).     Historical Provider, MD  senna-docusate (SENOKOT-S) 8.6-50 MG per tablet Take 1 tablet by mouth 2 (two) times daily. While taking pain meds to prevent constipation 01/22/15   [name], MD    Scheduled Meds: . [START ON  02/24/2015] aspirin EC  81 mg Oral Daily  . [START ON 02/24/2015] atorvastatin  40 mg Oral q1800  . calcium gluconate 1 GM IV  1 g Intravenous Once  . calcium gluconate      . [START ON 02/24/2015] carvedilol  3.125 mg Oral BID WC  . dextrose  1 ampule Intravenous Once  . insulin aspart  10 Units Intravenous Once  . LORazepam      . pantoprazole (PROTONIX) IV  40 mg Intravenous Q12H  . sodium chloride  3 mL Intravenous Q12H   Continuous Infusions: . sodium chloride 10 mL/hr (02/23/15 1715)  . sodium chloride    . sodium chloride    . nitroGLYCERIN     PRN Meds:.sodium chloride, acetaminophen, acetaminophen, nitroGLYCERIN, ondansetron (ZOFRAN) IV, ondansetron (ZOFRAN) IV, sodium chloride  Allergies as of 02/23/2015  . (No Known Allergies)    No family history on file.  History   Social History  . Marital Status: Married    Spouse Name: N/A  . Number of Children: N/A  . Years of Education: N/A   Occupational History  . Not on file.   Social History Main Topics  . Smoking status: Current Every Day Smoker -- 1.50 packs/day for 44 years    Types: Cigarettes  . Smokeless tobacco: Never Used     Comment: 1 1/2 pack a day  . Alcohol Use: Yes     Comment: 10-12 beers a day (12oz)   . Drug Use: No  . Sexual Activity: Yes    Birth Control/ Protection: None   Other Topics Concern  . Not on file   Social History Narrative    Review of Systems: Unable to obtain from patient (confused) Physical Exam: Vital signs: Filed Vitals:   02/23/15 1900  BP: 150/103  Pulse: 79  Temp: 98.7  Resp: 18     General:  Lethargic, disheveled, thin, no acute distress Head: atraumatic Eyes: pupils equal and reactive, anicteric sclera ENT: oropharynx clear Neck: supple, nontender Lungs:  Coarse breath sounds  Heart:  Regular rate and rhythm; no murmurs, clicks, rubs,  or gallops. Abdomen: soft, nontender, nondistended, +BS  Rectal:  Deferred Ext: pulses intact, no edema Neuro:  confused, oriented to person not time ("2010")  GI:  Lab Results:  Recent Labs  02/23/15 1519  02/23/15 1606 02/23/15 1647 02/23/15 1925  WBC 19.8*  --   --   --  21.4*  HGB 12.3*  < > 15.3 13.3 13.3  HCT 37.1*  < > 45.0 39.0 38.9*  PLT 317  --   --   --  372  < > = values in this interval not displayed. BMET  Recent Labs  02/23/15 1519  02/23/15 1606 02/23/15 1647 02/23/15 1925  NA 138  < > 140 143 141  K 6.1*  < > 6.4* 5.9* 6.5*  CL 111  < > 116* 118* 116*  CO2 14*  --   --   --  15*  GLUCOSE 154*  < > 136* 127* 117*  BUN 107*  < > 106* 99* 108*  CREATININE  2.80*  < > 2.50* 2.20* 2.51*  CALCIUM 8.9  --   --   --  8.6*  < > = values in this interval not displayed. LFT  Recent Labs  02/23/15 1925  PROT 6.7  ALBUMIN 2.2*  AST 117*  ALT 71*  ALKPHOS 185*  BILITOT 0.5   PT/INR  Recent Labs  02/23/15 1519  LABPROT 15.3*  INR 1.19     Studies/Results: No results found.  Impression/Plan: 64 yo s/p STEMI with 3 vessel disease seen on cardiac cath who has been having coffee grounds emesis without melena or hematochezia. No evidence of varices on EGD in 2014. No further episodes of coffee grounds emesis in the hospital and no bright red blood in emesis prior to admit. Source of coffee grounds emesis likely esophagitis or gastritis and doubt peptic ulcer source. Will do an EGD in the morning to further evaluate. NPO p MN. Continue Protonix 40 mg IV Q 12 hours.    LOS: 0 days   [place] C.  02/23/2015, 8:38 PM  Pager [phone]  If no answer or after 5 PM call [phone]

## 2015-02-24 NOTE — Progress Notes (Signed)
Pt has become increasingly agitated over the coarse of the day. Pt was sitting in chair, pulling at IV, undoing IV tubing x 3, cussing at wife and staff,trying to stand by self and pulling off leads. Pt's wife says he is in withdrawal and goes through this every time he is in hospital. Called Md and ordered ativan prn. Continue to monitor closely.    Darren Carr

## 2015-02-24 NOTE — Op Note (Signed)
[facility] [place], [address]   ENDOSCOPY PROCEDURE REPORT  PATIENT: Darren Carr, Darren Carr  MR#: [id number] BIRTHDATE: 1951-02-08 , 57  yrs. old GENDER: male ENDOSCOPIST: Wilford Corner, MD REFERRED BY: PROCEDURE DATE:  25-Feb-2015 PROCEDURE:  EGD, diagnostic ASA CLASS:     Class IV INDICATIONS:  hematemesis. MEDICATIONS: Benadryl 25 mg IV, Fentanyl 50 mcg IV, and Versed 4 mg IV TOPICAL ANESTHETIC: Cetacaine Spray  DESCRIPTION OF PROCEDURE: After the risks benefits and alternatives of the procedure were thoroughly explained, informed consent was obtained.  The Pentax Gastroscope [id number] endoscope was introduced through the mouth and advanced to the second portion of the duodenum , Without limitations.  The instrument was slowly withdrawn as the mucosa was fully examined. Estimated blood loss is zero unless otherwise noted in this procedure report.    Near circumferential ulcer with a flat red spot in the distal esophagus consistent with moderate erosive esophagitis. GEJ 38 cm from the incisors. Minimal erythema in antrum consistent with minimal gastritis. Stomach otherwise normal. Duodenal bulb and 2nd portion of the duodenum normal in appearance.       Retroflexed views revealed no abnormalities.     The scope was then withdrawn from the patient and the procedure completed.  COMPLICATIONS: There were no immediate complications.  ENDOSCOPIC IMPRESSION:     Moderate distal erosive esophagitis Minimal gastritis  RECOMMENDATIONS:     Protonix 40 mg IV Q 12 hours; Clear liquid diet and slowly advance   eSigned:  Wilford Corner, MD 2015/02/25 10:00 AM    CC:  CPT CODES: ICD CODES:  The ICD and CPT codes recommended by this software are interpretations from the data that the clinical staff has captured with the software.  The verification of the translation of this report to the ICD and CPT codes and modifiers is the  sole responsibility of the health care institution and practicing physician where this report was generated.  [facility] [facility] will not be held responsible for the validity of the ICD and CPT codes included on this report.  AMA assumes no liability for data contained or not contained herein. CPT is a Designer, television/film set of the [employer].  PATIENT NAME:  Darren Carr, Darren Carr MR#: [id number]

## 2015-02-24 NOTE — Progress Notes (Signed)
CRITICAL VALUE ALERT  Critical value received:  + blood cultures, anaerobic  And aerobic both gram negative rods  Date of notification:  02/24/15  Time of notification:  2145  Critical value read back:Yes.    Nurse who received alert:  Abram Sander  MD notified (1st page):  Harwarni  Time of first page:  2150  MD notified (2nd page):  Time of second page:  Responding MD:  Terrence Dupont  Time MD responded:  2152

## 2015-02-24 NOTE — Brief Op Note (Signed)
Moderate distal erosive esophagitis and minimal gastritis. Clear liquid diet today and ok to advance tomorrow as tolerated. IV PPI Q 12 hours. Will sign off. Call if questions.

## 2015-02-24 NOTE — Interval H&P Note (Signed)
History and Physical Interval Note:  02/24/2015 8:53 AM  Darren Carr  has presented today for surgery, with the diagnosis of GI Bleed  The various methods of treatment have been discussed with the patient and family. After consideration of risks, benefits and other options for treatment, the patient has consented to  Procedure(s): ESOPHAGOGASTRODUODENOSCOPY (EGD) (N/A) as a surgical intervention .  The patient's history has been reviewed, patient examined, no change in status, stable for surgery.  I have reviewed the patient's chart and labs.  Questions were answered to the patient's satisfaction.     [place] C.

## 2015-02-24 NOTE — Progress Notes (Signed)
Pt still agitated after anti-anxiety meds given at [place], pulling at lines, refusing continuous IV meds, called Dr. Cleon Dew. Orders to start haldol 1 mg prn. Will continue to monitor closely.

## 2015-02-25 ENCOUNTER — Encounter (HOSPITAL_COMMUNITY): Payer: Self-pay | Admitting: Cardiology

## 2015-02-25 MED ORDER — DEXTROSE 5 % IV SOLN
INTRAVENOUS | Status: DC
  Administered 2015-02-25 – 2015-03-01 (×4): via INTRAVENOUS
  Filled 2015-02-25 (×11): qty 100

## 2015-02-25 MED ORDER — LORAZEPAM 2 MG/ML IJ SOLN
2.0000 mg | Freq: Four times a day (QID) | INTRAMUSCULAR | Status: DC | PRN
  Administered 2015-02-25 (×3): 2 mg via INTRAVENOUS
  Filled 2015-02-25 (×3): qty 1

## 2015-02-25 MED FILL — Heparin Sodium (Porcine) 2 Unit/ML in Sodium Chloride 0.9%: INTRAMUSCULAR | Qty: 500 | Status: AC

## 2015-02-25 MED FILL — Lidocaine HCl Local Preservative Free (PF) Inj 1%: INTRAMUSCULAR | Qty: 30 | Status: AC

## 2015-02-25 NOTE — Evaluation (Signed)
Clinical/Bedside Swallow Evaluation Patient Details  Name: Darren Carr MRN: [id number] Date of Birth: 1951-08-19  Today's Date: 02/25/2015 Time: SLP Start Time (ACUTE ONLY): 3559 SLP Stop Time (ACUTE ONLY): 1141 SLP Time Calculation (min) (ACUTE ONLY): 9 min  Past Medical History:  Past Medical History  Diagnosis Date  . Alcoholic   . Chronic back pain   . Upper GI bleed   . Bladder tumor 07-2014  . Hypertension    Past Surgical History:  Past Surgical History  Procedure Laterality Date  . Appendectomy    . Esophagogastroduodenoscopy (egd) with propofol N/A 08/16/2013    Procedure: ESOPHAGOGASTRODUODENOSCOPY (EGD) WITH PROPOFOL Hiatus at 40cm Gastroesophageal Junction at 37cm;  Surgeon: Rogene Houston, MD;  Location: [facility];  Service: Endoscopy;  Laterality: N/A;  . Balloon dilation N/A 08/16/2013    Procedure: BALLOON DILATION to 16.46mm;  Surgeon: Rogene Houston, MD;  Location: [facility];  Service: Endoscopy;  Laterality: N/A;  . Esophageal biopsy  08/16/2013    Procedure: DISTAL ESOPHAGEAL BIOPSIES;  Surgeon: Rogene Houston, MD;  Location: [facility];  Service: Endoscopy;;  . Transurethral resection of prostate  07-2014,08-2014,09-2014  . Transurethral resection of bladder tumor with gyrus (turbt-gyrus) N/A 12/05/2014    Procedure: TRANSURETHRAL RESECTION OF BLADDER TUMOR WITH GYRUS (TURBT-GYRUS);  Surgeon: [name], MD;  Location: [facility];  Service: Urology;  Laterality: N/A;  . Cystoscopy w/ ureteral stent placement Bilateral 12/05/2014    Procedure: CYSTOSCOPY WITH BILATERAL RETROGRADE PYELOGRAM;  Surgeon: [name], MD;  Location: [facility];  Service: Urology;  Laterality: Bilateral;  . Robot assisted laparoscopic complete cystect ileal conduit N/A 01/16/2015    Procedure: ROBOTIC ASSISTED LAPAROSCOPIC COMPLETE CYSTECT ILEAL CONDUIT/ROBOTIC ASSISTED LAPAROSCOPIC RADICAL PROSTATECTOMY;  Surgeon: [name], MD;  Location: [facility];  Service: Urology;  Laterality: N/A;  .  Lymphadenectomy Bilateral 01/16/2015    Procedure: PELVIC LYMPH NODE DISSECTION;  Surgeon: [name], MD;  [facility]: [facility];  Service: Urology;  Laterality: Bilateral;  . Cystoscopy with injection N/A 01/16/2015    Procedure: CYSTOSCOPY WITH INJECTION OF INDOCYANINE GREEN DYE;  Surgeon: [name], MD;  Location: [facility];  Service: Urology;  Laterality: N/A;  . Cardiac catheterization N/A 02/23/2015    Procedure: Left Heart Cath and Coronary Angiography;  Surgeon: Charolette Forward, MD;  Location: [facility];  Service: Cardiovascular;  Laterality: N/A;   Darren Carr:  RAYMAN PETROSIAN is a 64 y.o. male with a history of alcohol abuse (reports no alcohol in the past 5 days) who has been having abdominal pain and coffee grounds emesis and then was found to have ST elevation on ECG and was taken to the cath lab.  History of an esophageal stricture with dilation in 2014 by Dr. Laural Golden. Pt underwent endoscopy on 6/12, GI reports Moderate distal erosive esophagitis and minimal gastritis. Clear liquid diet and ok to advance as tolerated. Pt has been agitated due to withdrawal.    Assessment / Plan / Recommendation Clinical Impression  Pt demonstrates adequate swallow function. Only concern if pts impulsivity which can lead to large consecutive straw sips followed by throat clearing, indicating possible airway penetration. When limited to 1-2 sips at a time, pts function is adequate. Masitcation despite missing dentition is also [facility]. Recommend pt upgrade to a regular texture diet with thin liquids with assistance to slow rate of intake. No SLP f/u needed, will sign off.     Aspiration Risk  Mild    Diet Recommendation Age appropriate regular solids;Thin  Medication Administration: Whole meds with liquid Compensations: Slow rate;Small sips/bites    Other  Recommendations Oral Care Recommendations: Oral care BID   Follow Up Recommendations       Frequency and Duration        Pertinent Vitals/Pain NA     SLP Swallow Goals     Swallow Study Prior Functional Status       General Other Pertinent Information: Darren Carr is a 64 y.o. male with a history of alcohol abuse (reports no alcohol in the past 5 days) who has been having abdominal pain and coffee grounds emesis and then was found to have ST elevation on ECG and was taken to the cath lab.  History of an esophageal stricture with dilation in 2014 by Dr. Laural Golden. Pt underwent endoscopy on 6/12, GI reports Moderate distal erosive esophagitis and minimal gastritis. Clear liquid diet and ok to advance as tolerated. Pt has been agitated due to withdrawal.  Type of Study: Bedside swallow evaluation Previous Swallow Assessment: Endoscopy see Darren Carr Diet Prior to this Study: Thin liquids Temperature Spikes Noted: No Respiratory Status: Room air History of Recent Intubation: No Behavior/Cognition: Alert;Impulsive;Requires cueing Oral Cavity - Dentition: Missing dentition Self-Feeding Abilities: Needs assist Patient Positioning: Upright in bed Baseline Vocal Quality: Normal Volitional Cough: Strong Volitional Swallow: Able to elicit    Oral/Motor/Sensory Function Overall Oral Motor/Sensory Function: Appears within functional limits for tasks assessed   Ice Chips     Thin Liquid Thin Liquid: Impaired Presentation: Cup;Straw Pharyngeal  Phase Impairments: Throat Clearing - Immediate (with large consecutive sips)    Nectar Thick Nectar Thick Liquid: Not tested   Honey Thick Honey Thick Liquid: Not tested   Puree Puree: Within functional limits   Solid   GO    Solid: Within functional limits      [facility], MA CCC-SLP [phone]  Lynann Beaver 02/25/2015,11:47 AM

## 2015-02-25 NOTE — Progress Notes (Signed)
Subjective:  Patient denies any chest pain states he feels hungry. Had episodes of agitation and hallucination yesterday. Blood cultures positive for gram-negative rods final culture pending  Objective:  Vital Signs in the last 24 hours: Temp:  [98.2 F (36.8 C)-98.5 F (36.9 C)] 98.3 F (36.8 C) (06/13 0739) Pulse Rate:  [73-137] 73 (06/13 0739) Resp:  [14-27] 18 (06/13 0739) BP: (92-142)/(59-100) 121/88 mmHg (06/13 0739) SpO2:  [89 %-100 %] 97 % (06/13 0739)  Intake/Output from previous day: 06/12 0701 - 06/13 0700 In: 2023.8 [P.O.:960; I.V.:763.8; IV Piggyback:300] Out: 3460 [Urine:3460] Intake/Output from this shift:    Physical Exam: Neck: no adenopathy, no carotid bruit, no JVD and supple, symmetrical, trachea midline Lungs: clear to auscultation bilaterally Heart: regular rate and rhythm, S1, S2 normal and Soft systolic murmur noted Abdomen: soft, non-tender; bowel sounds normal; no masses,  no organomegaly Extremities: extremities normal, atraumatic, no cyanosis or edema  Lab Results:  Recent Labs  02/24/15 0630 02/24/15 1015  WBC 22.0* 18.3*  HGB 12.9* 14.4  PLT 376 319    Recent Labs  02/24/15 0630 02/24/15 1015  NA 147* 149*  K 5.5* 4.8  CL 116* 122*  CO2 17* 16*  GLUCOSE 115* 101*  BUN 95* 89*  CREATININE 2.58* 2.56*    Recent Labs  02/24/15 0630 02/24/15 1015  TROPONINI 20.02* 25.53*   Hepatic Function Panel  Recent Labs  02/23/15 1925  PROT 6.7  ALBUMIN 2.2*  AST 117*  ALT 71*  ALKPHOS 185*  BILITOT 0.5    Recent Labs  02/24/15 0109  CHOL 161   No results for input(s): PROTIME in the last 72 hours.  Imaging: Imaging results have been reviewed and Dg Chest Port 1 View  02/23/2015   CLINICAL DATA:  Chest pain and vomiting. Recent cardiac catheterization.  EXAM: PORTABLE CHEST - 1 VIEW  COMPARISON:  12/03/2014  FINDINGS: A single AP portable view of the chest demonstrates no focal airspace consolidation or alveolar edema. The  lungs are grossly clear. There is no large effusion or pneumothorax. Cardiac and mediastinal contours appear unremarkable.  IMPRESSION: No active disease.   Electronically Signed   By: Andreas Newport M.D.   On: 02/23/2015 21:00    Cardiac Studies:  Assessment/Plan:  Evolving acute anterolateral wall MI status post left cath Multivessel CAD Gram-negative bacteremia questionable urosepsis Status post probable Acute upper GI bleed secondary to distal erosive esophagitis stable Hypertension Hypercholesteremia History of Ca of bladder status post resection and diverging urostomy EtOH abuse Dehydration Acute renal injury History of esophageal stricture status post irritation in the past Plan Continue present management Swallowing evaluation Up in chair as tolerated Check final cultures may need ID consult Dr. Doylene Canard on call for me until Friday morning Advance diet if swallowing function okay continue slow hydration Change IV fluids as per orders   LOS: 2 days    Charolette Forward 02/25/2015, 7:57 AM

## 2015-02-25 NOTE — Progress Notes (Signed)
Pt over the night has states he is seeing things, very agitated despite prn meds, pulling out IVs, getting out of bed, called dr. Terrence Dupont. Orders to increase ativan to 2mg  Q6hrs prn. Will continue to monitor.

## 2015-02-26 LAB — BASIC METABOLIC PANEL
ANION GAP: 9 (ref 5–15)
BUN: 61 mg/dL — ABNORMAL HIGH (ref 6–20)
CO2: 20 mmol/L — ABNORMAL LOW (ref 22–32)
Calcium: 8.7 mg/dL — ABNORMAL LOW (ref 8.9–10.3)
Chloride: 120 mmol/L — ABNORMAL HIGH (ref 101–111)
Creatinine, Ser: 2.53 mg/dL — ABNORMAL HIGH (ref 0.61–1.24)
GFR calc non Af Amer: 26 mL/min — ABNORMAL LOW (ref >60)
GFR, EST AFRICAN AMERICAN: 30 mL/min — AB (ref >60)
Glucose, Bld: 116 mg/dL — ABNORMAL HIGH (ref 65–99)
POTASSIUM: 3.8 mmol/L (ref 3.5–5.1)
Sodium: 149 mmol/L — ABNORMAL HIGH (ref 135–145)

## 2015-02-26 LAB — CBC
HEMATOCRIT: 34.8 % — AB (ref 39.0–52.0)
Hemoglobin: 11.9 g/dL — ABNORMAL LOW (ref 13.0–17.0)
MCH: 30.8 pg (ref 26.0–34.0)
MCHC: 34.2 g/dL (ref 30.0–36.0)
MCV: 90.2 fL (ref 78.0–100.0)
PLATELETS: 478 10*3/uL — AB (ref 150–400)
RBC: 3.86 MIL/uL — ABNORMAL LOW (ref 4.22–5.81)
RDW: 15 % (ref 11.5–15.5)
WBC: 13.4 10*3/uL — AB (ref 4.0–10.5)

## 2015-02-26 LAB — CULTURE, BLOOD (ROUTINE X 2)

## 2015-02-26 LAB — TROPONIN I: Troponin I: 15.5 ng/mL (ref <0.031)

## 2015-02-26 NOTE — Progress Notes (Signed)
ANTIBIOTIC CONSULT NOTE   Pharmacy Consult for Vancomycin/Zosyn  Indication: rule out pneumonia  No Known Allergies  Patient Measurements: Height: 5\' 10"  (177.8 cm) Weight: 146 lb 13.2 oz (66.6 kg) IBW/kg (Calculated) : 73  Vital Signs: Temp: 97.6 F (36.4 C) (06/14 0800) Temp Source: Oral (06/14 0800) BP: 119/89 mmHg (06/14 1000) Pulse Rate: 73 (06/14 0744) Intake/Output from previous day: 06/13 0701 - 06/14 0700 In: 1943.5 [P.O.:325; I.V.:1506; IV Piggyback:112.5] Out: 2975 [Urine:2975] Intake/Output from this shift: Total I/O In: 287.5 [P.O.:250; IV Piggyback:37.5] Out: 400 [Urine:400]  Labs:  Recent Labs  02/24/15 0630 02/24/15 1015 02/26/15 0305  WBC 22.0* 18.3* 13.4*  HGB 12.9* 14.4 11.9*  PLT 376 319 478*  CREATININE 2.58* 2.56* 2.53*   Estimated Creatinine Clearance: 28.5 mL/min (by C-G formula based on Cr of 2.53). No results for input(s): VANCOTROUGH, VANCOPEAK, VANCORANDOM, GENTTROUGH, GENTPEAK, GENTRANDOM, TOBRATROUGH, TOBRAPEAK, TOBRARND, AMIKACINPEAK, AMIKACINTROU, AMIKACIN in the last 72 hours.   Microbiology: Recent Results (from the past 720 hour(s))  MRSA PCR Screening     Status: None   Collection Time: 02/23/15  5:58 PM  Result Value Ref Range Status   MRSA by PCR NEGATIVE NEGATIVE Final    Comment:        The GeneXpert MRSA Assay (FDA approved for NASAL specimens only), is one component of a comprehensive MRSA colonization surveillance program. It is not intended to diagnose MRSA infection nor to guide or monitor treatment for MRSA infections.   Culture, blood (routine x 2)     Status: None   Collection Time: 02/23/15 11:47 PM  Result Value Ref Range Status   Specimen Description BLOOD RIGHT HAND  Final   Special Requests BOTTLES DRAWN AEROBIC AND ANAEROBIC 4CC EA  Final   Culture   Final    ESCHERICHIA COLI Note: Gram Stain Report Called to,Read Back By and Verified With: Blase Mess RN [id number] Performed at [facility]     Report Status 02/26/2015 FINAL  Final   Organism ID, Bacteria ESCHERICHIA COLI  Final      Susceptibility   Escherichia coli - MIC*    AMPICILLIN >=32 RESISTANT Resistant     AMPICILLIN/SULBACTAM >=32 RESISTANT Resistant     CEFAZOLIN <=4 SENSITIVE Sensitive     CEFEPIME <=1 SENSITIVE Sensitive     CEFTAZIDIME <=1 SENSITIVE Sensitive     CEFTRIAXONE <=1 SENSITIVE Sensitive     CIPROFLOXACIN <=0.25 SENSITIVE Sensitive     GENTAMICIN <=1 SENSITIVE Sensitive     IMIPENEM <=0.25 SENSITIVE Sensitive     PIP/TAZO <=4 SENSITIVE Sensitive     TOBRAMYCIN <=1 SENSITIVE Sensitive     TRIMETH/SULFA <=20 SENSITIVE Sensitive     * ESCHERICHIA COLI  Culture, blood (routine x 2)     Status: None (Preliminary result)   Collection Time: 02/23/15 11:55 PM  Result Value Ref Range Status   Specimen Description BLOOD LEFT HAND  Final   Special Requests BOTTLES DRAWN AEROBIC ONLY 4CC  Final   Culture   Final           BLOOD CULTURE RECEIVED NO GROWTH TO DATE CULTURE WILL BE HELD FOR 5 DAYS BEFORE ISSUING A FINAL NEGATIVE REPORT Performed at [facility]    Report Status PENDING  Incomplete    Assessment: 64 yo M admitted 02/23/2015  with STEMI, coffee ground emesis after recent bladder removal for cancer. Pharmacy consulted to dose vancomycin and zosyn for possible pneumonia  Patient continues on empiric abx and is currently  on day #3 of vanc/zosyn. No fevers noted, wbc trending down to 22>13. Lactic acid 1.5, ecoli in bld culture.   6/11 Vanc>> 6/ 11 Zosyn>>  6/11 bld - 1/2 ecoli - resistant to amp and unasyn, otherwise sensitive  Goal of Therapy:  Vancomycin trough level 15-20 mcg/ml  Plan:  Consider narrowing antibiotics Vancomycin 1g IV q24h - will need to check trough if to continue Zosyn 3.375g IV q8h infuse over 4h Follow up SCr, UOP, cultures, clinical course and adjust as clinically indicated.  Thank you for allowing pharmacy to be a part of this patients care  team.  Erin Hearing PharmD., BCPS Clinical Pharmacist Pager [phone] 02/26/2015 10:25 AM

## 2015-02-26 NOTE — Progress Notes (Signed)
Ref: Terald Sleeper, PA-C   Subjective:  Awake, afebrile. Improving WBC count.  Objective:  Vital Signs in the last 24 hours: Temp:  [97.4 F (36.3 C)-97.8 F (36.6 C)] 97.8 F (36.6 C) (06/14 1600) Pulse Rate:  [73-91] 91 (06/14 1810) Cardiac Rhythm:  [-] Normal sinus rhythm (06/14 0800) Resp:  [16-27] 18 (06/14 1600) BP: (92-139)/(72-116) 92/78 mmHg (06/14 1810) SpO2:  [98 %-99 %] 98 % (06/14 0400)  Physical Exam: BP Readings from Last 1 Encounters:  02/26/15 92/78    Wt Readings from Last 1 Encounters:  02/23/15 66.6 kg (146 lb 13.2 oz)    Weight change:   HEENT: [place]/AT, Eyes- PERL, EOMI, Conjunctiva-Pink, Sclera-Non-icteric Neck: No JVD, No bruit, Trachea midline. Lungs:  Clear, Bilateral. Cardiac:  Regular rhythm, normal S1 and S2, no S3.  Abdomen:  Soft, non-tender. Extremities:  No edema present. No cyanosis. No clubbing. CNS: AxOx2, Cranial nerves grossly intact, moves all 4 extremities. Right handed. Skin: Warm and dry.   Intake/Output from previous day: 06/13 0701 - 06/14 0700 In: 1943.5 [P.O.:325; I.V.:1506; IV Piggyback:112.5] Out: 2975 [Urine:2975]    Lab Results: BMET    Component Value Date/Time   NA 149* 02/26/2015 0305   NA 149* 02/24/2015 1015   NA 147* 02/24/2015 0630   K 3.8 02/26/2015 0305   K 4.8 02/24/2015 1015   K 5.5* 02/24/2015 0630   CL 120* 02/26/2015 0305   CL 122* 02/24/2015 1015   CL 116* 02/24/2015 0630   CO2 20* 02/26/2015 0305   CO2 16* 02/24/2015 1015   CO2 17* 02/24/2015 0630   GLUCOSE 116* 02/26/2015 0305   GLUCOSE 101* 02/24/2015 1015   GLUCOSE 115* 02/24/2015 0630   BUN 61* 02/26/2015 0305   BUN 89* 02/24/2015 1015   BUN 95* 02/24/2015 0630   CREATININE 2.53* 02/26/2015 0305   CREATININE 2.56* 02/24/2015 1015   CREATININE 2.58* 02/24/2015 0630   CALCIUM 8.7* 02/26/2015 0305   CALCIUM 8.4* 02/24/2015 1015   CALCIUM 8.5* 02/24/2015 0630   GFRNONAA 26* 02/26/2015 0305   GFRNONAA 25* 02/24/2015 1015   GFRNONAA  25* 02/24/2015 0630   GFRAA 30* 02/26/2015 0305   GFRAA 29* 02/24/2015 1015   GFRAA 29* 02/24/2015 0630   CBC    Component Value Date/Time   WBC 13.4* 02/26/2015 0305   RBC 3.86* 02/26/2015 0305   HGB 11.9* 02/26/2015 0305   HCT 34.8* 02/26/2015 0305   PLT 478* 02/26/2015 0305   MCV 90.2 02/26/2015 0305   MCH 30.8 02/26/2015 0305   MCHC 34.2 02/26/2015 0305   RDW 15.0 02/26/2015 0305   LYMPHSABS 1.2 02/23/2015 1925   MONOABS 1.9* 02/23/2015 1925   EOSABS 0.0 02/23/2015 1925   BASOSABS 0.0 02/23/2015 1925   HEPATIC Function Panel  Recent Labs  02/23/15 1519 02/23/15 1925  PROT 7.5 6.7   HEMOGLOBIN A1C No components found for: HGA1C,  MPG CARDIAC ENZYMES Lab Results  Component Value Date   TROPONINI 15.50* 02/26/2015   TROPONINI 25.53* 02/24/2015   TROPONINI 20.02* 02/24/2015   BNP No results for input(s): PROBNP in the last 8760 hours. TSH No results for input(s): TSH in the last 8760 hours. CHOLESTEROL  Recent Labs  02/24/15 0109  CHOL 161    Scheduled Meds: . aspirin EC  81 mg Oral Daily  . atorvastatin  40 mg Oral q1800  . carvedilol  6.25 mg Oral BID WC  . dextrose  1 ampule Intravenous Once  . insulin aspart  10 Units  Intravenous Once  . pantoprazole (PROTONIX) IV  40 mg Intravenous Q12H  . piperacillin-tazobactam (ZOSYN)  IV  3.375 g Intravenous 3 times per day  . sodium bicarbonate  650 mg Oral BID  . sodium chloride  3 mL Intravenous Q12H  . vancomycin  1,000 mg Intravenous Q24H   Continuous Infusions: . nitroGLYCERIN Stopped (02/24/15 1315)  .  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 Continue medical treatment.   LOS: 3 days    Dixie Dials  MD  02/26/2015, 7:30 PM

## 2015-02-27 MED ORDER — PANTOPRAZOLE SODIUM 40 MG PO TBEC
40.0000 mg | DELAYED_RELEASE_TABLET | Freq: Two times a day (BID) | ORAL | Status: DC
  Administered 2015-02-27 – 2015-03-09 (×20): 40 mg via ORAL
  Filled 2015-02-27 (×20): qty 1

## 2015-02-27 MED ORDER — CEFAZOLIN SODIUM-DEXTROSE 2-3 GM-% IV SOLR
2.0000 g | Freq: Two times a day (BID) | INTRAVENOUS | Status: DC
  Administered 2015-02-27 – 2015-02-28 (×2): 2 g via INTRAVENOUS
  Filled 2015-02-27 (×4): qty 50

## 2015-02-27 NOTE — Consult Note (Signed)
WOC ostomy consult note Stoma type/location: Consult requested for ostomy assistance.  Pt has a urostomy from May 2016 and states he is independent with pouch application and emptying when at home.  He appears to be well-informed regarding ordering supplies and pouching routines. Stomal assessment/size: Current pouch intact with good seal; applied by bedside nurse yesterday. Output: Mod amt yellow urine in bedside drainage bag. Ostomy pouching: 1pc.  Supplies at bedside for patient and staff nurse use; he uses a barrier ring and convex urostomy pouch.  He denies further questions at this time. Please re-consult if further assistance is needed.  Thank-you,  Julien Girt MSN, [place], [place], [place], [place]

## 2015-02-27 NOTE — Progress Notes (Signed)
Ref: Terald Sleeper, PA-C   Subjective:  Off alcohol for 1 week. No tremors. T max 99 degree F. Now on Ancef for E Coli bacteremia.  Objective:  Vital Signs in the last 24 hours: Temp:  [98 F (36.7 C)-99 F (37.2 C)] 98.8 F (37.1 C) (06/15 1928) Pulse Rate:  [65-73] 66 (06/15 1928) Cardiac Rhythm:  [-] Normal sinus rhythm (06/15 1928) Resp:  [18-20] 18 (06/15 1928) BP: (94-126)/(74-90) 94/74 mmHg (06/15 1928) SpO2:  [98 %-100 %] 99 % (06/15 1928)  Physical Exam: BP Readings from Last 1 Encounters:  02/27/15 94/74    Wt Readings from Last 1 Encounters:  02/23/15 66.6 kg (146 lb 13.2 oz)    Weight change:   HEENT: [place]/AT, Eyes-PERL, EOMI, Conjunctiva-Pink, Sclera-Non-icteric Neck: No JVD, No bruit, Trachea midline. Lungs:  Clear, Bilateral. Cardiac:  Regular rhythm, normal S1 and S2, no S3.  Abdomen:  Soft, non-tender. Extremities:  No edema present. No cyanosis. No clubbing. CNS: AxOx2, Cranial nerves grossly intact, moves all 4 extremities. Right handed. Skin: Warm and dry.   Intake/Output from previous day: 06/14 0701 - 06/15 0700 In: 1525 [P.O.:675; I.V.:300; IV Piggyback:550] Out: 2300 [Urine:2300]    Lab Results: BMET    Component Value Date/Time   NA 149* 02/26/2015 0305   NA 149* 02/24/2015 1015   NA 147* 02/24/2015 0630   K 3.8 02/26/2015 0305   K 4.8 02/24/2015 1015   K 5.5* 02/24/2015 0630   CL 120* 02/26/2015 0305   CL 122* 02/24/2015 1015   CL 116* 02/24/2015 0630   CO2 20* 02/26/2015 0305   CO2 16* 02/24/2015 1015   CO2 17* 02/24/2015 0630   GLUCOSE 116* 02/26/2015 0305   GLUCOSE 101* 02/24/2015 1015   GLUCOSE 115* 02/24/2015 0630   BUN 61* 02/26/2015 0305   BUN 89* 02/24/2015 1015   BUN 95* 02/24/2015 0630   CREATININE 2.53* 02/26/2015 0305   CREATININE 2.56* 02/24/2015 1015   CREATININE 2.58* 02/24/2015 0630   CALCIUM 8.7* 02/26/2015 0305   CALCIUM 8.4* 02/24/2015 1015   CALCIUM 8.5* 02/24/2015 0630   GFRNONAA 26* 02/26/2015 0305   GFRNONAA 25* 02/24/2015 1015   GFRNONAA 25* 02/24/2015 0630   GFRAA 30* 02/26/2015 0305   GFRAA 29* 02/24/2015 1015   GFRAA 29* 02/24/2015 0630   CBC    Component Value Date/Time   WBC 13.4* 02/26/2015 0305   RBC 3.86* 02/26/2015 0305   HGB 11.9* 02/26/2015 0305   HCT 34.8* 02/26/2015 0305   PLT 478* 02/26/2015 0305   MCV 90.2 02/26/2015 0305   MCH 30.8 02/26/2015 0305   MCHC 34.2 02/26/2015 0305   RDW 15.0 02/26/2015 0305   LYMPHSABS 1.2 02/23/2015 1925   MONOABS 1.9* 02/23/2015 1925   EOSABS 0.0 02/23/2015 1925   BASOSABS 0.0 02/23/2015 1925   HEPATIC Function Panel  Recent Labs  02/23/15 1519 02/23/15 1925  PROT 7.5 6.7   HEMOGLOBIN A1C No components found for: HGA1C,  MPG CARDIAC ENZYMES Lab Results  Component Value Date   TROPONINI 15.50* 02/26/2015   TROPONINI 25.53* 02/24/2015   TROPONINI 20.02* 02/24/2015   BNP No results for input(s): PROBNP in the last 8760 hours. TSH No results for input(s): TSH in the last 8760 hours. CHOLESTEROL  Recent Labs  02/24/15 0109  CHOL 161    Scheduled Meds: . aspirin EC  81 mg Oral Daily  . atorvastatin  40 mg Oral q1800  . carvedilol  6.25 mg Oral BID WC  .  ceFAZolin (ANCEF) IV  2 g Intravenous Q12H  . dextrose  1 ampule Intravenous Once  . insulin aspart  10 Units Intravenous Once  . pantoprazole  40 mg Oral BID  . sodium bicarbonate  650 mg Oral BID  . sodium chloride  3 mL Intravenous Q12H   Continuous Infusions: . nitroGLYCERIN Stopped (02/24/15 1315)  .  sodium bicarbonate  infusion 1000 mL 75 mL/hr at 02/27/15 1707   PRN Meds:.sodium chloride, acetaminophen, haloperidol lactate, LORazepam, nitroGLYCERIN, ondansetron (ZOFRAN) IV, sodium chloride  Assessment/Plan: Acute anterolateral wall MI S/P multivessel CAD Gram-negative bacteremia questionable urosepsis Status post probable Acute upper GI bleed secondary to distal erosive esophagitis stable Hypertension Hypercholesteremia History of Ca of  bladder status post resection and diverging urostomy EtOH abuse Dehydration Acute renal injury History of esophageal stricture status post irritation in the past  Increase activity. Antibiotic changes to Ancef.     LOS: 4 days    Dixie Dials  MD  02/27/2015, 8:07 PM

## 2015-02-28 LAB — CBC
HCT: 33 % — ABNORMAL LOW (ref 39.0–52.0)
Hemoglobin: 11.2 g/dL — ABNORMAL LOW (ref 13.0–17.0)
MCH: 30.4 pg (ref 26.0–34.0)
MCHC: 33.9 g/dL (ref 30.0–36.0)
MCV: 89.4 fL (ref 78.0–100.0)
PLATELETS: 430 10*3/uL — AB (ref 150–400)
RBC: 3.69 MIL/uL — ABNORMAL LOW (ref 4.22–5.81)
RDW: 14.3 % (ref 11.5–15.5)
WBC: 11.5 10*3/uL — AB (ref 4.0–10.5)

## 2015-02-28 LAB — COMPREHENSIVE METABOLIC PANEL
ALK PHOS: 175 U/L — AB (ref 38–126)
ALT: 35 U/L (ref 17–63)
AST: 32 U/L (ref 15–41)
Albumin: 1.9 g/dL — ABNORMAL LOW (ref 3.5–5.0)
Anion gap: 12 (ref 5–15)
BILIRUBIN TOTAL: 0.9 mg/dL (ref 0.3–1.2)
BUN: 37 mg/dL — AB (ref 6–20)
CALCIUM: 7.6 mg/dL — AB (ref 8.9–10.3)
CHLORIDE: 103 mmol/L (ref 101–111)
CO2: 22 mmol/L (ref 22–32)
Creatinine, Ser: 2.12 mg/dL — ABNORMAL HIGH (ref 0.61–1.24)
GFR calc Af Amer: 37 mL/min — ABNORMAL LOW (ref >60)
GFR calc non Af Amer: 32 mL/min — ABNORMAL LOW (ref >60)
Glucose, Bld: 97 mg/dL (ref 65–99)
Potassium: 3.9 mmol/L (ref 3.5–5.1)
Sodium: 137 mmol/L (ref 135–145)
Total Protein: 6.7 g/dL (ref 6.5–8.1)

## 2015-02-28 MED ORDER — CEFAZOLIN SODIUM-DEXTROSE 2-3 GM-% IV SOLR
2.0000 g | Freq: Three times a day (TID) | INTRAVENOUS | Status: DC
Start: 2015-02-28 — End: 2015-03-04
  Administered 2015-02-28 – 2015-03-04 (×12): 2 g via INTRAVENOUS
  Filled 2015-02-28 (×15): qty 50

## 2015-02-28 NOTE — Progress Notes (Signed)
Utilization review complete. Eugen Jeansonne RN CCM Case Mgmt phone [phone] 

## 2015-02-28 NOTE — Progress Notes (Signed)
Ref: Terald Sleeper, PA-C   Subjective:  Feeling better. Improving WBC count. Afebrile. CVTS wants to wait for CABG till infection and alcohol abuse are under control.  Objective:  Vital Signs in the last 24 hours: Temp:  [98.3 F (36.8 C)-98.5 F (36.9 C)] 98.3 F (36.8 C) (06/16 2019) Pulse Rate:  [53] 53 (06/16 2019) Cardiac Rhythm:  [-] Normal sinus rhythm (06/16 2000) Resp:  [18] 18 (06/16 2019) BP: (90-105)/(66-84) 105/84 mmHg (06/16 2019) SpO2:  [96 %-99 %] 99 % (06/16 2019)  Physical Exam: BP Readings from Last 1 Encounters:  02/28/15 105/84    Wt Readings from Last 1 Encounters:  02/23/15 66.6 kg (146 lb 13.2 oz)    Weight change:   HEENT: [place]/AT, Eyes- PERL, EOMI, Conjunctiva-Pink, Sclera-Non-icteric Neck: No JVD, No bruit, Trachea midline. Lungs:  Clear, Bilateral. Cardiac:  Regular rhythm, normal S1 and S2, no S3.  Abdomen:  Soft, non-tender. Extremities:  No edema present. No cyanosis. No clubbing. CNS: AxOx2, Cranial nerves grossly intact, moves all 4 extremities. Right handed. Skin: Warm and dry.   Intake/Output from previous day: 06/15 0701 - 06/16 0700 In: 2580 [P.O.:1030; I.V.:1500; IV Piggyback:50] Out: 2800 [Urine:2800]    Lab Results: BMET    Component Value Date/Time   NA 137 02/28/2015 0302   NA 149* 02/26/2015 0305   NA 149* 02/24/2015 1015   K 3.9 02/28/2015 0302   K 3.8 02/26/2015 0305   K 4.8 02/24/2015 1015   CL 103 02/28/2015 0302   CL 120* 02/26/2015 0305   CL 122* 02/24/2015 1015   CO2 22 02/28/2015 0302   CO2 20* 02/26/2015 0305   CO2 16* 02/24/2015 1015   GLUCOSE 97 02/28/2015 0302   GLUCOSE 116* 02/26/2015 0305   GLUCOSE 101* 02/24/2015 1015   BUN 37* 02/28/2015 0302   BUN 61* 02/26/2015 0305   BUN 89* 02/24/2015 1015   CREATININE 2.12* 02/28/2015 0302   CREATININE 2.53* 02/26/2015 0305   CREATININE 2.56* 02/24/2015 1015   CALCIUM 7.6* 02/28/2015 0302   CALCIUM 8.7* 02/26/2015 0305   CALCIUM 8.4* 02/24/2015 1015   GFRNONAA 32* 02/28/2015 0302   GFRNONAA 26* 02/26/2015 0305   GFRNONAA 25* 02/24/2015 1015   GFRAA 37* 02/28/2015 0302   GFRAA 30* 02/26/2015 0305   GFRAA 29* 02/24/2015 1015   CBC    Component Value Date/Time   WBC 11.5* 02/28/2015 0302   RBC 3.69* 02/28/2015 0302   HGB 11.2* 02/28/2015 0302   HCT 33.0* 02/28/2015 0302   PLT 430* 02/28/2015 0302   MCV 89.4 02/28/2015 0302   MCH 30.4 02/28/2015 0302   MCHC 33.9 02/28/2015 0302   RDW 14.3 02/28/2015 0302   LYMPHSABS 1.2 02/23/2015 1925   MONOABS 1.9* 02/23/2015 1925   EOSABS 0.0 02/23/2015 1925   BASOSABS 0.0 02/23/2015 1925   HEPATIC Function Panel  Recent Labs  02/23/15 1519 02/23/15 1925 02/28/15 0302  PROT 7.5 6.7 6.7   HEMOGLOBIN A1C No components found for: HGA1C,  MPG CARDIAC ENZYMES Lab Results  Component Value Date   TROPONINI 15.50* 02/26/2015   TROPONINI 25.53* 02/24/2015   TROPONINI 20.02* 02/24/2015   BNP No results for input(s): PROBNP in the last 8760 hours. TSH No results for input(s): TSH in the last 8760 hours. CHOLESTEROL  Recent Labs  02/24/15 0109  CHOL 161    Scheduled Meds: . aspirin EC  81 mg Oral Daily  . atorvastatin  40 mg Oral q1800  . carvedilol  6.25 mg Oral  BID WC  .  ceFAZolin (ANCEF) IV  2 g Intravenous 3 times per day  . dextrose  1 ampule Intravenous Once  . insulin aspart  10 Units Intravenous Once  . pantoprazole  40 mg Oral BID  . sodium bicarbonate  650 mg Oral BID  . sodium chloride  3 mL Intravenous Q12H   Continuous Infusions: . nitroGLYCERIN Stopped (02/24/15 1315)  .  sodium bicarbonate  infusion 1000 mL 75 mL/hr at 02/28/15 2000   PRN Meds:.sodium chloride, acetaminophen, haloperidol lactate, LORazepam, nitroGLYCERIN, ondansetron (ZOFRAN) IV, sodium chloride  Assessment/Plan: Acute anterolateral wall MI S/P multivessel CAD Gram-negative bacteremia questionable urosepsis Status post probable Acute upper GI bleed secondary to distal erosive  esophagitis stable Hypertension Hypercholesteremia History of Ca of bladder status post resection and diverging urostomy EtOH abuse Dehydration Acute renal injury History of esophageal stricture status post irritation in the past  Coronary intervention per Dr. Terrence Dupont.     LOS: 5 days    Dixie Dials  MD  02/28/2015, 11:48 PM

## 2015-03-01 ENCOUNTER — Inpatient Hospital Stay (HOSPITAL_COMMUNITY): Payer: BLUE CROSS/BLUE SHIELD

## 2015-03-01 MED ORDER — CLOPIDOGREL BISULFATE 75 MG PO TABS
75.0000 mg | ORAL_TABLET | Freq: Every day | ORAL | Status: DC
  Administered 2015-03-01 – 2015-03-04 (×4): 75 mg via ORAL
  Filled 2015-03-01 (×4): qty 1

## 2015-03-01 MED ORDER — SODIUM CHLORIDE 0.9 % IV SOLN: 250.0000 mL | INTRAVENOUS | Status: DC | PRN

## 2015-03-01 NOTE — Progress Notes (Signed)
Subjective:  Patient denies any chest pain or shortness of breath. Denies further abdominal pain. No further coffee ground. Discussed with patient and with his wife various options of treatment i.e. CABG versus staged PCI as risk and benefits patient refusing for CABG and wanted to proceed with staged PCI.  Objective:  Vital Signs in the last 24 hours: Temp:  [98.3 F (36.8 C)-98.6 F (37 C)] 98.6 F (37 C) (06/17 0800) Pulse Rate:  [53-69] 69 (06/17 0926) Resp:  [16-18] 16 (06/17 0800) BP: (96-106)/(65-84) 106/72 mmHg (06/17 0926) SpO2:  [98 %-100 %] 99 % (06/17 0800)  Intake/Output from previous day: 06/16 0701 - 06/17 0700 In: 1875 [I.V.:1725; IV Piggyback:150] Out: 3025 [Urine:3025] Intake/Output from this shift: Total I/O In: 615 [P.O.:240; I.V.:375] Out: 1225 [Urine:1225]  Physical Exam: Neck: no adenopathy, no carotid bruit, no JVD and supple, symmetrical, trachea midline Lungs: clear to auscultation bilaterally Heart: regular rate and rhythm, S1, S2 normal and Soft systolic murmur noted Abdomen: soft, non-tender; bowel sounds normal; no masses,  no organomegaly Extremities: extremities normal, atraumatic, no cyanosis or edema  Lab Results:  Recent Labs  02/28/15 0302  WBC 11.5*  HGB 11.2*  PLT 430*    Recent Labs  02/28/15 0302  NA 137  K 3.9  CL 103  CO2 22  GLUCOSE 97  BUN 37*  CREATININE 2.12*   No results for input(s): TROPONINI in the last 72 hours.  Invalid input(s): CK, MB Hepatic Function Panel  Recent Labs  02/28/15 0302  PROT 6.7  ALBUMIN 1.9*  AST 32  ALT 35  ALKPHOS 175*  BILITOT 0.9   No results for input(s): CHOL in the last 72 hours. No results for input(s): PROTIME in the last 72 hours.  Imaging: Imaging results have been reviewed and No results found.  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 If able to tolerate Plavix no further bleeding hemoglobin stable will schedule him for staged PCI for next week Patient and wife understands the risk of bleeding with dual antiplatelets date medication worsening renal function, death MI stroke need for emergency CABG etc. and consents for PCI Check repeat blood cultures Check renal ultrasound to rule out hydronephrosis  Dr. Doylene Canard on-call for weekend   LOS: 6 days    Charolette Forward 03/01/2015, 1:10 PM

## 2015-03-02 ENCOUNTER — Inpatient Hospital Stay (HOSPITAL_COMMUNITY): Payer: BLUE CROSS/BLUE SHIELD

## 2015-03-02 LAB — CULTURE, BLOOD (ROUTINE X 2): Culture: NO GROWTH

## 2015-03-02 LAB — CBC
HCT: 33.2 % — ABNORMAL LOW (ref 39.0–52.0)
Hemoglobin: 11.1 g/dL — ABNORMAL LOW (ref 13.0–17.0)
MCH: 30.2 pg (ref 26.0–34.0)
MCHC: 33.4 g/dL (ref 30.0–36.0)
MCV: 90.2 fL (ref 78.0–100.0)
PLATELETS: 389 10*3/uL (ref 150–400)
RBC: 3.68 MIL/uL — ABNORMAL LOW (ref 4.22–5.81)
RDW: 14 % (ref 11.5–15.5)
WBC: 9.1 10*3/uL (ref 4.0–10.5)

## 2015-03-02 LAB — BASIC METABOLIC PANEL
Anion gap: 10 (ref 5–15)
BUN: 21 mg/dL — AB (ref 6–20)
CO2: 26 mmol/L (ref 22–32)
Calcium: 7.8 mg/dL — ABNORMAL LOW (ref 8.9–10.3)
Chloride: 100 mmol/L — ABNORMAL LOW (ref 101–111)
Creatinine, Ser: 1.92 mg/dL — ABNORMAL HIGH (ref 0.61–1.24)
GFR calc non Af Amer: 36 mL/min — ABNORMAL LOW (ref >60)
GFR, EST AFRICAN AMERICAN: 41 mL/min — AB (ref >60)
Glucose, Bld: 109 mg/dL — ABNORMAL HIGH (ref 65–99)
Potassium: 4 mmol/L (ref 3.5–5.1)
Sodium: 136 mmol/L (ref 135–145)

## 2015-03-02 LAB — TROPONIN I: Troponin I: 3.03 ng/mL

## 2015-03-02 MED ORDER — SODIUM CHLORIDE 0.9 % IV SOLN
250.0000 mL | INTRAVENOUS | Status: DC
  Administered 2015-03-02: 25 mL via INTRAVENOUS

## 2015-03-02 MED ORDER — SODIUM CHLORIDE 0.9 % IV SOLN: 250.0000 mL | INTRAVENOUS | Status: DC

## 2015-03-02 MED ORDER — OXYCODONE HCL 5 MG PO TABS
5.0000 mg | ORAL_TABLET | ORAL | Status: DC | PRN
  Administered 2015-03-02 – 2015-03-09 (×24): 5 mg via ORAL
  Filled 2015-03-02 (×24): qty 1

## 2015-03-02 MED ORDER — DICYCLOMINE HCL 10 MG PO CAPS
10.0000 mg | ORAL_CAPSULE | Freq: Two times a day (BID) | ORAL | Status: DC
  Administered 2015-03-02 – 2015-03-03 (×4): 10 mg via ORAL
  Filled 2015-03-02 (×6): qty 1

## 2015-03-02 NOTE — Progress Notes (Signed)
Ref: Terald Sleeper, PA-C   Subjective:  Feeling better except back pain. T max 99.7 F. No chest pain.  Objective:  Vital Signs in the last 24 hours: Temp:  [97.6 F (36.4 C)-99.7 F (37.6 C)] 97.9 F (36.6 C) (06/18 0749) Pulse Rate:  [69] 69 (06/17 1750) Cardiac Rhythm:  [-] Sinus bradycardia (06/18 0453) Resp:  [16-20] 16 (06/18 0749) BP: (86-97)/(65-70) 94/70 mmHg (06/18 0749) SpO2:  [97 %-100 %] 97 % (06/18 0749)  Physical Exam: BP Readings from Last 1 Encounters:  03/02/15 94/70    Wt Readings from Last 1 Encounters:  02/23/15 66.6 kg (146 lb 13.2 oz)    Weight change:   HEENT: [place]/AT, Eyes-PERL, EOMI, Conjunctiva-Pink, Sclera-Non-icteric Neck: No JVD, No bruit, Trachea midline. Lungs:  Clear, Bilateral. Cardiac:  Regular rhythm, normal S1 and S2, no S3.  Abdomen:  Soft, non-tender. Extremities:  No edema present. No cyanosis. No clubbing. CNS: AxOx3, Cranial nerves grossly intact, moves all 4 extremities. Right handed. Skin: Warm and dry.   Intake/Output from previous day: 06/17 0701 - 06/18 0700 In: 955 [P.O.:480; I.V.:375; IV Piggyback:100] Out: 3325 [Urine:3325]    Lab Results: BMET    Component Value Date/Time   NA 136 03/02/2015 0256   NA 137 02/28/2015 0302   NA 149* 02/26/2015 0305   K 4.0 03/02/2015 0256   K 3.9 02/28/2015 0302   K 3.8 02/26/2015 0305   CL 100* 03/02/2015 0256   CL 103 02/28/2015 0302   CL 120* 02/26/2015 0305   CO2 26 03/02/2015 0256   CO2 22 02/28/2015 0302   CO2 20* 02/26/2015 0305   GLUCOSE 109* 03/02/2015 0256   GLUCOSE 97 02/28/2015 0302   GLUCOSE 116* 02/26/2015 0305   BUN 21* 03/02/2015 0256   BUN 37* 02/28/2015 0302   BUN 61* 02/26/2015 0305   CREATININE 1.92* 03/02/2015 0256   CREATININE 2.12* 02/28/2015 0302   CREATININE 2.53* 02/26/2015 0305   CALCIUM 7.8* 03/02/2015 0256   CALCIUM 7.6* 02/28/2015 0302   CALCIUM 8.7* 02/26/2015 0305   GFRNONAA 36* 03/02/2015 0256   GFRNONAA 32* 02/28/2015 0302   GFRNONAA  26* 02/26/2015 0305   GFRAA 41* 03/02/2015 0256   GFRAA 37* 02/28/2015 0302   GFRAA 30* 02/26/2015 0305   CBC    Component Value Date/Time   WBC 9.1 03/02/2015 0256   RBC 3.68* 03/02/2015 0256   HGB 11.1* 03/02/2015 0256   HCT 33.2* 03/02/2015 0256   PLT 389 03/02/2015 0256   MCV 90.2 03/02/2015 0256   MCH 30.2 03/02/2015 0256   MCHC 33.4 03/02/2015 0256   RDW 14.0 03/02/2015 0256   LYMPHSABS 1.2 02/23/2015 1925   MONOABS 1.9* 02/23/2015 1925   EOSABS 0.0 02/23/2015 1925   BASOSABS 0.0 02/23/2015 1925   HEPATIC Function Panel  Recent Labs  02/23/15 1519 02/23/15 1925 02/28/15 0302  PROT 7.5 6.7 6.7   HEMOGLOBIN A1C No components found for: HGA1C,  MPG CARDIAC ENZYMES Lab Results  Component Value Date   TROPONINI 3.03* 03/02/2015   TROPONINI 15.50* 02/26/2015   TROPONINI 25.53* 02/24/2015   BNP No results for input(s): PROBNP in the last 8760 hours. TSH No results for input(s): TSH in the last 8760 hours. CHOLESTEROL  Recent Labs  02/24/15 0109  CHOL 161    Scheduled Meds: . aspirin EC  81 mg Oral Daily  . atorvastatin  40 mg Oral q1800  . carvedilol  6.25 mg Oral BID WC  .  ceFAZolin (ANCEF) IV  2 g Intravenous 3 times per day  . clopidogrel  75 mg Oral Daily  . pantoprazole  40 mg Oral BID  . sodium chloride  3 mL Intravenous Q12H   Continuous Infusions: . sodium chloride    . 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 Small dose IV fluid as tolerated. Small dose Bentyl to relax small bowel     LOS: 7 days    Dixie Dials  MD  03/02/2015, 9:39 AM

## 2015-03-04 ENCOUNTER — Inpatient Hospital Stay (HOSPITAL_COMMUNITY): Payer: BLUE CROSS/BLUE SHIELD

## 2015-03-04 ENCOUNTER — Encounter (HOSPITAL_COMMUNITY): Payer: Self-pay | Admitting: *Deleted

## 2015-03-04 LAB — CBC
HCT: 32.6 % — ABNORMAL LOW (ref 39.0–52.0)
Hemoglobin: 10.8 g/dL — ABNORMAL LOW (ref 13.0–17.0)
MCH: 30.2 pg (ref 26.0–34.0)
MCHC: 33.1 g/dL (ref 30.0–36.0)
MCV: 91.1 fL (ref 78.0–100.0)
Platelets: 296 10*3/uL (ref 150–400)
RBC: 3.58 MIL/uL — ABNORMAL LOW (ref 4.22–5.81)
RDW: 13.8 % (ref 11.5–15.5)
WBC: 10.1 10*3/uL (ref 4.0–10.5)

## 2015-03-04 LAB — BASIC METABOLIC PANEL
Anion gap: 9 (ref 5–15)
BUN: 24 mg/dL — AB (ref 6–20)
CO2: 19 mmol/L — AB (ref 22–32)
Calcium: 7.7 mg/dL — ABNORMAL LOW (ref 8.9–10.3)
Chloride: 106 mmol/L (ref 101–111)
Creatinine, Ser: 1.96 mg/dL — ABNORMAL HIGH (ref 0.61–1.24)
GFR calc Af Amer: 40 mL/min — ABNORMAL LOW (ref >60)
GFR calc non Af Amer: 35 mL/min — ABNORMAL LOW (ref >60)
GLUCOSE: 89 mg/dL (ref 65–99)
POTASSIUM: 4.2 mmol/L (ref 3.5–5.1)
Sodium: 134 mmol/L — ABNORMAL LOW (ref 135–145)

## 2015-03-04 MED ORDER — CEFAZOLIN SODIUM-DEXTROSE 2-3 GM-% IV SOLR
2.0000 g | Freq: Three times a day (TID) | INTRAVENOUS | Status: DC
  Administered 2015-03-04 – 2015-03-06 (×6): 2 g via INTRAVENOUS
  Filled 2015-03-04 (×8): qty 50

## 2015-03-04 MED ORDER — CEFAZOLIN SODIUM 1-5 GM-% IV SOLN: 1.0000 g | Freq: Three times a day (TID) | INTRAVENOUS | Status: DC

## 2015-03-04 MED ORDER — SODIUM BICARBONATE 650 MG PO TABS
650.0000 mg | ORAL_TABLET | Freq: Three times a day (TID) | ORAL | Status: DC
  Administered 2015-03-04 – 2015-03-09 (×15): 650 mg via ORAL
  Filled 2015-03-04 (×18): qty 1

## 2015-03-04 NOTE — Consult Note (Addendum)
Consultation: Left hydronephrosis, acute renal failure Requested by: Dr. Terrence Dupont  History of Present Illness: patient admitted with confusion and malaise and noted to have bacteremia with Escherichia coli. Also non-ST elevated MI. He underwent cardiac cath which showed multivessel disease and patient is not operative candidate per CT surgery. Repeat heart cath pending for potential stent placement.   Patient initial creatinine on admission was around 2.5 with elevated BUN. His BUN has improved with medical treatment over the past week but his creatinine remains elevated at 1.9. He underwent renal ultrasound which showed normal right kidney with mild to moderate left hydronephrosis.  Today, Darren Carr was seen for the above. He feels much better. Urine output has been good.  Past Medical History  Diagnosis Date  . Alcoholic   . Chronic back pain   . Upper GI bleed   . Bladder tumor 07-2014  . Hypertension    Past Surgical History  Procedure Laterality Date  . Appendectomy    . Esophagogastroduodenoscopy (egd) with propofol N/A 08/16/2013    Procedure: ESOPHAGOGASTRODUODENOSCOPY (EGD) WITH PROPOFOL Hiatus at 40cm Gastroesophageal Junction at 37cm;  Surgeon: Rogene Houston, MD;  Location: [facility];  Service: Endoscopy;  Laterality: N/A;  . Balloon dilation N/A 08/16/2013    Procedure: BALLOON DILATION to 16.58mm;  Surgeon: Rogene Houston, MD;  Location: [facility];  Service: Endoscopy;  Laterality: N/A;  . Esophageal biopsy  08/16/2013    Procedure: DISTAL ESOPHAGEAL BIOPSIES;  Surgeon: Rogene Houston, MD;  Location: [facility];  Service: Endoscopy;;  . Transurethral resection of prostate  07-2014,08-2014,09-2014  . Transurethral resection of bladder tumor with gyrus (turbt-gyrus) N/A 12/05/2014    Procedure: TRANSURETHRAL RESECTION OF BLADDER TUMOR WITH GYRUS (TURBT-GYRUS);  Surgeon: [name], MD;  Location: [facility];  Service: Urology;  Laterality: N/A;  . Cystoscopy w/ ureteral stent placement  Bilateral 12/05/2014    Procedure: CYSTOSCOPY WITH BILATERAL RETROGRADE PYELOGRAM;  Surgeon: [name], MD;  Location: [facility];  Service: Urology;  Laterality: Bilateral;  . Robot assisted laparoscopic complete cystect ileal conduit N/A 01/16/2015    Procedure: ROBOTIC ASSISTED LAPAROSCOPIC COMPLETE CYSTECT ILEAL CONDUIT/ROBOTIC ASSISTED LAPAROSCOPIC RADICAL PROSTATECTOMY;  Surgeon: [name], MD;  Location: [facility];  Service: Urology;  Laterality: N/A;  . Lymphadenectomy Bilateral 01/16/2015    Procedure: PELVIC LYMPH NODE DISSECTION;  Surgeon: [name], MD;  Location: [facility];  Service: Urology;  Laterality: Bilateral;  . Cystoscopy with injection N/A 01/16/2015    Procedure: CYSTOSCOPY WITH INJECTION OF INDOCYANINE GREEN DYE;  Surgeon: [name], MD;  Location: [facility];  Service: Urology;  Laterality: N/A;  . Cardiac catheterization N/A 02/23/2015    Procedure: Left Heart Cath and Coronary Angiography;  Surgeon: Charolette Forward, MD;  Location: [facility];  Service: Cardiovascular;  Laterality: N/A;  . Esophagogastroduodenoscopy N/A 02/24/2015    Procedure: ESOPHAGOGASTRODUODENOSCOPY (EGD);  Surgeon: Wilford Corner, MD;  Location: [facility];  Service: Endoscopy;  Laterality: N/A;    Home Medications:  Prescriptions prior to admission  Medication Sig Dispense Refill Last Dose  . Oxycodone HCl 10 MG TABS Take 10-20 mg by mouth 4 (four) times daily.   Past Week at Unknown time  . traMADol (ULTRAM) 50 MG tablet Take 50-100 mg by mouth every 6 (six) hours as needed for moderate pain or severe pain.   Past Week at Unknown time  . HYDROmorphone (DILAUDID) 4 MG tablet Take 1-2 tablets (4-8 mg total) by mouth every 4 (four) hours as needed for moderate pain  or severe pain. Post-operatively (Patient not taking: Reported on 02/23/2015) 50 tablet 0 Not Taking  . lisinopril (PRINIVIL,ZESTRIL) 5 MG tablet Take 5 mg by mouth daily.   unknown  . omeprazole (PRILOSEC) 20 MG capsule Take 20 mg  by mouth daily as needed (Heartburn).    unknown  . senna-docusate (SENOKOT-S) 8.6-50 MG per tablet Take 1 tablet by mouth 2 (two) times daily. While taking pain meds to prevent constipation 30 tablet 0 unknown   Allergies: No Known Allergies  History reviewed. No pertinent family history. Social History:  reports that he has been smoking Cigarettes.  He has a 66 pack-year smoking history. He has never used smokeless tobacco. He reports that he drinks alcohol. He reports that he does not use illicit drugs.  ROS: A complete review of systems was performed.  All systems are negative except for pertinent findings as noted. Review of Systems  All other systems reviewed and are negative.    Physical Exam:  Vital signs in last 24 hours: Temp:  [97.9 F (36.6 C)-98.6 F (37 C)] 98 F (36.7 C) (06/20 1125) Pulse Rate:  [57-65] 62 (06/20 1125) Resp:  [16-19] 17 (06/20 1125) BP: (75-100)/(50-77) 75/50 mmHg (06/20 1125) SpO2:  [96 %-98 %] 97 % (06/20 1125)   Intake/Output Summary (Last 24 hours) at 03/04/15 1155 Last data filed at 03/04/15 1100  Gross per 24 hour  Intake   1470 ml  Output   3200 ml  Net  -1730 ml    General:  Alert and oriented, No acute distress, sitting in a chair watching TV Abdomen: Soft, nontender, nondistended, no abdominal masses; ileal conduit pink, viable - clear urine in pouch and bag Back: No CVA tenderness Extremities: No edema Neurologic: Grossly intact  Laboratory Data:  Results for orders placed or performed during the hospital encounter of 02/23/15 (from the past 24 hour(s))  CBC     Status: Abnormal   Collection Time: 03/04/15  2:56 AM  Result Value Ref Range   WBC 10.1 4.0 - 10.5 K/uL   RBC 3.58 (L) 4.22 - 5.81 MIL/uL   Hemoglobin 10.8 (L) 13.0 - 17.0 g/dL   HCT 32.6 (L) 39.0 - 52.0 %   MCV 91.1 78.0 - 100.0 fL   MCH 30.2 26.0 - 34.0 pg   MCHC 33.1 30.0 - 36.0 g/dL   RDW 13.8 11.5 - 15.5 %   Platelets 296 150 - 400 K/uL  Basic metabolic  panel     Status: Abnormal   Collection Time: 03/04/15  2:56 AM  Result Value Ref Range   Sodium 134 (L) 135 - 145 mmol/L   Potassium 4.2 3.5 - 5.1 mmol/L   Chloride 106 101 - 111 mmol/L   CO2 19 (L) 22 - 32 mmol/L   Glucose, Bld 89 65 - 99 mg/dL   BUN 24 (H) 6 - 20 mg/dL   Creatinine, Ser 1.96 (H) 0.61 - 1.24 mg/dL   Calcium 7.7 (L) 8.9 - 10.3 mg/dL   GFR calc non Af Amer 35 (L) >60 mL/min   GFR calc Af Amer 40 (L) >60 mL/min   Anion gap 9 5 - 15   Recent Results (from the past 240 hour(s))  MRSA PCR Screening     Status: None   Collection Time: 02/23/15  5:58 PM  Result Value Ref Range Status   MRSA by PCR NEGATIVE NEGATIVE Final    Comment:        The GeneXpert MRSA Assay (FDA approved  for NASAL specimens only), is one component of a comprehensive MRSA colonization surveillance program. It is not intended to diagnose MRSA infection nor to guide or monitor treatment for MRSA infections.   Culture, blood (routine x 2)     Status: None   Collection Time: 02/23/15 11:47 PM  Result Value Ref Range Status   Specimen Description BLOOD RIGHT HAND  Final   Special Requests BOTTLES DRAWN AEROBIC AND ANAEROBIC 4CC EA  Final   Culture   Final    ESCHERICHIA COLI Note: Gram Stain Report Called to,Read Back By and Verified With: Blase Mess RN [id number] Performed at [facility]    Report Status 02/26/2015 FINAL  Final   Organism ID, Bacteria ESCHERICHIA COLI  Final      Susceptibility   Escherichia coli - MIC*    AMPICILLIN >=32 RESISTANT Resistant     AMPICILLIN/SULBACTAM >=32 RESISTANT Resistant     CEFAZOLIN <=4 SENSITIVE Sensitive     CEFEPIME <=1 SENSITIVE Sensitive     CEFTAZIDIME <=1 SENSITIVE Sensitive     CEFTRIAXONE <=1 SENSITIVE Sensitive     CIPROFLOXACIN <=0.25 SENSITIVE Sensitive     GENTAMICIN <=1 SENSITIVE Sensitive     IMIPENEM <=0.25 SENSITIVE Sensitive     PIP/TAZO <=4 SENSITIVE Sensitive     TOBRAMYCIN <=1 SENSITIVE Sensitive     TRIMETH/SULFA <=20  SENSITIVE Sensitive     * ESCHERICHIA COLI  Culture, blood (routine x 2)     Status: None   Collection Time: 02/23/15 11:55 PM  Result Value Ref Range Status   Specimen Description BLOOD LEFT HAND  Final   Special Requests BOTTLES DRAWN AEROBIC ONLY 4CC  Final   Culture   Final    NO GROWTH 5 DAYS Performed at [facility]    Report Status 03/02/2015 FINAL  Final  Culture, blood (single)     Status: None (Preliminary result)   Collection Time: 03/01/15 10:04 AM  Result Value Ref Range Status   Specimen Description BLOOD ARM RIGHT  Final   Special Requests BOTTLES DRAWN AEROBIC AND ANAEROBIC 5CC  Final   Culture NO GROWTH 2 DAYS  Final   Report Status PENDING  Incomplete   Creatinine:  Recent Labs  02/26/15 0305 02/28/15 0302 03/02/15 0256 03/04/15 0256  CREATININE 2.53* 2.12* 1.92* 1.96*    Impression/Assessment/plan -   Acute renal failure-likely multifactorial. Creatinine has settled out at 1.9 which is higher than his prior baseline. The patient looks and feels much better. Urine output is good.   Left hydronephrosis-this is not uncommon after ileal conduit especially with routing the left ureter through the retroperitoneum to the ileal conduit on the right side and may be physiologic. We'll set patient up for CT scan of the abdomen and pelvis to clarify the extent of hydronephrosis and level of possible obstruction. Left ureteral stenting through the ileal conduit can be very difficult therefore patient could need left nephrostomy tube but he cannot left nephrostomy tube if he had a new drug-eluting stent and was on required anticoagulation. He's made 3,200 ml of UOP last 24 hrs, therefore not certain a left ureteral stent or nephrostomy tube would improve kidney function.   Bacteremia-patient on cefazolin. ? Source.   I'll notify Dr. Tresa Moore of admission and patient status.  I discussed patient with Dr. Terrence Dupont.

## 2015-03-04 NOTE — Progress Notes (Signed)
Ref: Terald Sleeper, PA-C   Subjective:  Feeling better. Awaiting angioplasty in AM. Chronic back pain. Afebrile. Stable renal function.  Objective:  Vital Signs in the last 24 hours: Temp:  [97.9 F (36.6 C)-98.6 F (37 C)] 97.9 F (36.6 C) (06/20 0751) Pulse Rate:  [57-65] 57 (06/20 0751) Cardiac Rhythm:  [-] Normal sinus rhythm;Sinus bradycardia (06/20 0400) Resp:  [16-19] 18 (06/20 0751) BP: (89-100)/(64-77) 97/64 mmHg (06/20 0751) SpO2:  [96 %-99 %] 96 % (06/20 0751)  Physical Exam: BP Readings from Last 1 Encounters:  03/04/15 97/64    Wt Readings from Last 1 Encounters:  02/23/15 66.6 kg (146 lb 13.2 oz)    Weight change:   HEENT: [place]/AT, Eyes- PERL, EOMI, Conjunctiva-Pink, Sclera-Non-icteric Neck: No JVD, No bruit, Trachea midline. Lungs:  Clear, Bilateral. Cardiac:  Regular rhythm, normal S1 and S2, no S3.  Abdomen:  Soft, non-tender. Extremities:  No edema present. No cyanosis. No clubbing. CNS: AxOx3, Cranial nerves grossly intact, moves all 4 extremities. Right handed. Skin: Warm and dry.   Intake/Output from previous day: 06/19 0701 - 06/20 0700 In: 1360 [P.O.:960; I.V.:250; IV Piggyback:150] Out: 3200 [Urine:3200]    Lab Results: BMET    Component Value Date/Time   NA 134* 03/04/2015 0256   NA 136 03/02/2015 0256   NA 137 02/28/2015 0302   K 4.2 03/04/2015 0256   K 4.0 03/02/2015 0256   K 3.9 02/28/2015 0302   CL 106 03/04/2015 0256   CL 100* 03/02/2015 0256   CL 103 02/28/2015 0302   CO2 19* 03/04/2015 0256   CO2 26 03/02/2015 0256   CO2 22 02/28/2015 0302   GLUCOSE 89 03/04/2015 0256   GLUCOSE 109* 03/02/2015 0256   GLUCOSE 97 02/28/2015 0302   BUN 24* 03/04/2015 0256   BUN 21* 03/02/2015 0256   BUN 37* 02/28/2015 0302   CREATININE 1.96* 03/04/2015 0256   CREATININE 1.92* 03/02/2015 0256   CREATININE 2.12* 02/28/2015 0302   CALCIUM 7.7* 03/04/2015 0256   CALCIUM 7.8* 03/02/2015 0256   CALCIUM 7.6* 02/28/2015 0302   GFRNONAA 35*  03/04/2015 0256   GFRNONAA 36* 03/02/2015 0256   GFRNONAA 32* 02/28/2015 0302   GFRAA 40* 03/04/2015 0256   GFRAA 41* 03/02/2015 0256   GFRAA 37* 02/28/2015 0302   CBC    Component Value Date/Time   WBC 10.1 03/04/2015 0256   RBC 3.58* 03/04/2015 0256   HGB 10.8* 03/04/2015 0256   HCT 32.6* 03/04/2015 0256   PLT 296 03/04/2015 0256   MCV 91.1 03/04/2015 0256   MCH 30.2 03/04/2015 0256   MCHC 33.1 03/04/2015 0256   RDW 13.8 03/04/2015 0256   LYMPHSABS 1.2 02/23/2015 1925   MONOABS 1.9* 02/23/2015 1925   EOSABS 0.0 02/23/2015 1925   BASOSABS 0.0 02/23/2015 1925   HEPATIC Function Panel  Recent Labs  02/23/15 1519 02/23/15 1925 02/28/15 0302  PROT 7.5 6.7 6.7   HEMOGLOBIN A1C No components found for: HGA1C,  MPG CARDIAC ENZYMES Lab Results  Component Value Date   TROPONINI 3.03* 03/02/2015   TROPONINI 15.50* 02/26/2015   TROPONINI 25.53* 02/24/2015   BNP No results for input(s): PROBNP in the last 8760 hours. TSH No results for input(s): TSH in the last 8760 hours. CHOLESTEROL  Recent Labs  02/24/15 0109  CHOL 161    Scheduled Meds: . aspirin EC  81 mg Oral Daily  . atorvastatin  40 mg Oral q1800  . carvedilol  6.25 mg Oral BID WC  .  ceFAZolin (ANCEF) IV  1 g Intravenous 3 times per day  . clopidogrel  75 mg Oral Daily  . pantoprazole  40 mg Oral BID  . sodium bicarbonate  650 mg Oral TID  . sodium chloride  3 mL Intravenous Q12H   Continuous Infusions: . sodium chloride Stopped (03/02/15 2300)  . 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     LOS: 9 days    Dixie Dials  MD  03/04/2015, 9:14 AM

## 2015-03-04 NOTE — Progress Notes (Signed)
S:  1 - Metastatic Bladder Cancer - s/p cystectomy with bilateral pelvic lymphadenectomy and ileal conduit urinary diversion 01/2015 for pT3N1Mx high-grade urothelial carcinoma. He had postive sentinal right peri-ureteral lymph node as well as margin-positive disease.  2 - Acute on Chronic Renal Insufficiency / Mild-Mod Left Hydronephrosis - baseline Cr 1.2's s/p cystectomy noted acute rise to 2.5 this admission, received angiography and Cr now around 2 and stable. Mod left hydro by [place] and dedicated CT to distal ureter. UOP excellent.  3 - E. Coli Bacteremia- 1 of 2 BCX positive for E Coli 02/23/15 this admission. Now on CX-specific Kefzol. F/U [facility] 6/17 now growth to date.  O: AFVSS  Trop I trending down Cr 1.9 CT left mod hydro to distal ureter  A/P:  1 - Metastatic Bladder Cancer - long term prognosis guarded, >2 year life expectancy would be unusual. Ideally he would receive adjuvant chemo but GFR would need to be better. He had malignant hydro pre-cystectomy as well.   2 - Acute on Chronic Renal Insufficiency / Mild-Mod Left Hydronephrosis - likely multifactorial from low perfusion state / MI + recent IV contrast + partial obstruction. Agree with goal to maximize renal function to make better PTCI and chemo candidate. As any new tubes / lines would likely cause significant bleeding in anticoagulated state, I feel ASAP nuc med renogram would be helpful. If demonstrates left obstruction, then left neph tube would be reasonable.   3 - E. Coli Bacteremia- probable GU source. Agree with current ABX and fortunately f/u CX's negative.   Will discuss with primary team in AM these considerations. Challenging situation.

## 2015-03-04 NOTE — Progress Notes (Signed)
Subjective:  Complains of back pain.  Patient had renal ultrasound which showed new left mild to moderate hydronephrosis with resolution of right-sided hydronephrosis.  Renal function remains abnormal.  No further episodes of coffee-ground, no abdominal pain.  Objective:  Vital Signs in the last 24 hours: Temp:  [97.9 F (36.6 C)-98.6 F (37 C)] 97.9 F (36.6 C) (06/20 0751) Pulse Rate:  [57-65] 57 (06/20 0751) Resp:  [16-19] 18 (06/20 0751) BP: (89-100)/(64-77) 97/64 mmHg (06/20 0751) SpO2:  [96 %-99 %] 96 % (06/20 0751)  Intake/Output from previous day: 06/19 0701 - 06/20 0700 In: 1360 [P.O.:960; I.V.:250; IV Piggyback:150] Out: 3200 [Urine:3200] Intake/Output from this shift:    Physical Exam: Neck: no adenopathy, no carotid bruit, no JVD and supple, symmetrical, trachea midline Lungs: clear to auscultation bilaterally Heart: regular rate and rhythm, S1, S2 normal and soft systolic murmur noted Abdomen: soft, non-tender; bowel sounds normal; no masses,  no organomegaly Extremities: extremities normal, atraumatic, no cyanosis or edema  Lab Results:  Recent Labs  03/02/15 0256 03/04/15 0256  WBC 9.1 10.1  HGB 11.1* 10.8*  PLT 389 296    Recent Labs  03/02/15 0256 03/04/15 0256  NA 136 134*  K 4.0 4.2  CL 100* 106  CO2 26 19*  GLUCOSE 109* 89  BUN 21* 24*  CREATININE 1.92* 1.96*    Recent Labs  03/02/15 0256  TROPONINI 3.03*   Hepatic Function Panel No results for input(s): PROT, ALBUMIN, AST, ALT, ALKPHOS, BILITOT, BILIDIR, IBILI in the last 72 hours. No results for input(s): CHOL in the last 72 hours. No results for input(s): PROTIME in the last 72 hours.  Imaging: Imaging results have been reviewed and No results found.  Cardiac Studies:  Assessment/Plan:  Status post acute anterolateral wall MI Status post acute upper GI bleed Multivessel CAD Hypertension Hypercholesteremia History of EtOH abuse History of esophageal stricture status post  dilatation in the past Status post EGD antral gastritis Resolving acute renal injury High-grade bladder cancer status post radical cystoprostatectomy dominant with bilateral pelvic lymphadenectomy and ileal conduit urinary diversion History of right malignant hydronephrosis Resolving Escherichia coli bacteremia Resolving urosepsis Probable obstructive uropathy. Plan Will get urology consult. We will hold off for PCI for today.  Discussed with patient and his wife and agrees  LOS: 9 days    Charolette Forward 03/04/2015, 8:57 AM

## 2015-03-05 ENCOUNTER — Inpatient Hospital Stay (HOSPITAL_COMMUNITY): Payer: BLUE CROSS/BLUE SHIELD

## 2015-03-05 ENCOUNTER — Encounter (HOSPITAL_COMMUNITY)
Admission: EM | Disposition: A | Discharge status: Home / Self Care | Payer: Self-pay | Source: Home / Self Care | Attending: Cardiology

## 2015-03-05 LAB — BASIC METABOLIC PANEL
ANION GAP: 8 (ref 5–15)
BUN: 29 mg/dL — ABNORMAL HIGH (ref 6–20)
CALCIUM: 7.9 mg/dL — AB (ref 8.9–10.3)
CHLORIDE: 104 mmol/L (ref 101–111)
CO2: 22 mmol/L (ref 22–32)
CREATININE: 2.11 mg/dL — AB (ref 0.61–1.24)
GFR, EST AFRICAN AMERICAN: 37 mL/min — AB (ref >60)
GFR, EST NON AFRICAN AMERICAN: 32 mL/min — AB (ref >60)
Glucose, Bld: 90 mg/dL (ref 65–99)
POTASSIUM: 4.9 mmol/L (ref 3.5–5.1)
SODIUM: 134 mmol/L — AB (ref 135–145)

## 2015-03-05 LAB — CBC
HEMATOCRIT: 32.5 % — AB (ref 39.0–52.0)
Hemoglobin: 10.7 g/dL — ABNORMAL LOW (ref 13.0–17.0)
MCH: 30.2 pg (ref 26.0–34.0)
MCHC: 32.9 g/dL (ref 30.0–36.0)
MCV: 91.8 fL (ref 78.0–100.0)
PLATELETS: 284 10*3/uL (ref 150–400)
RBC: 3.54 MIL/uL — ABNORMAL LOW (ref 4.22–5.81)
RDW: 13.8 % (ref 11.5–15.5)
WBC: 9.4 10*3/uL (ref 4.0–10.5)

## 2015-03-05 SURGERY — CORONARY STENT INTERVENTION
Anesthesia: LOCAL

## 2015-03-05 MED ORDER — SODIUM CHLORIDE 0.9 % IJ SOLN: 3.0000 mL | INTRAMUSCULAR | Status: DC | PRN

## 2015-03-05 MED ORDER — SODIUM CHLORIDE 0.9 % IV SOLN: 250.0000 mL | INTRAVENOUS | Status: DC | PRN

## 2015-03-05 MED ORDER — FUROSEMIDE 10 MG/ML IJ SOLN
0.5000 mg/kg | Freq: Once | INTRAMUSCULAR | Status: AC
  Administered 2015-03-05: 33 mg via INTRAVENOUS

## 2015-03-05 MED ORDER — ASPIRIN 81 MG PO CHEW: 81.0000 mg | CHEWABLE_TABLET | ORAL | Status: DC

## 2015-03-05 MED ORDER — SODIUM CHLORIDE 0.9 % WEIGHT BASED INFUSION
3.0000 mL/kg/h | INTRAVENOUS | Status: AC
Start: 2015-03-05 — End: 2015-03-05

## 2015-03-05 MED ORDER — SODIUM CHLORIDE 0.9 % IJ SOLN: 3.0000 mL | Freq: Two times a day (BID) | INTRAMUSCULAR | Status: DC

## 2015-03-05 MED ORDER — SODIUM CHLORIDE 0.9 % WEIGHT BASED INFUSION: 3.0000 mL/kg/h | INTRAVENOUS | Status: DC

## 2015-03-05 MED ORDER — SODIUM CHLORIDE 0.9 % WEIGHT BASED INFUSION: 1.0000 mL/kg/h | INTRAVENOUS | Status: DC

## 2015-03-05 MED ORDER — SODIUM BICARBONATE BOLUS VIA INFUSION
INTRAVENOUS | Status: DC
  Filled 2015-03-05: qty 1

## 2015-03-05 MED ORDER — TECHNETIUM TC 99M MERTIATIDE
15.0000 | Freq: Once | INTRAVENOUS | Status: AC | PRN
  Administered 2015-03-05: 15 via INTRAVENOUS

## 2015-03-05 MED ORDER — SODIUM BICARBONATE 8.4 % IV SOLN
INTRAVENOUS | Status: DC
  Filled 2015-03-05: qty 1000

## 2015-03-05 MED ORDER — FUROSEMIDE 10 MG/ML IJ SOLN
INTRAMUSCULAR | Status: AC
  Administered 2015-03-05: 33 mg via INTRAVENOUS
  Filled 2015-03-05: qty 4

## 2015-03-05 NOTE — Progress Notes (Signed)
Subjective:  Appreciate urologic consult and help. Patient denies any chest pain or shortness of breath. Schedule for venogram today and if positive for obstruction may need left nephrectomy tube. Percutaneous coronary interventions have been held for now. Discussed with patient and his wife and agree.  Objective:  Vital Signs in the last 24 hours: Temp:  [98 F (36.7 C)-98.4 F (36.9 C)] 98.1 F (36.7 C) (06/21 0722) Pulse Rate:  [58-62] 58 (06/20 1620) Resp:  [16-18] 18 (06/21 0722) BP: (75-106)/(50-82) 102/81 mmHg (06/21 0722) SpO2:  [96 %-99 %] 98 % (06/21 0722)  Intake/Output from previous day: 06/20 0701 - 06/21 0700 In: 1390 [P.O.:1200; I.V.:40; IV Piggyback:150] Out: 3250 [Urine:3250] Intake/Output from this shift: Total I/O In: 240 [P.O.:240] Out: 800 [Urine:800]  Physical Exam: Neck: no adenopathy, no carotid bruit, no JVD and supple, symmetrical, trachea midline Lungs: clear to auscultation bilaterally Heart: regular rate and rhythm, S1, S2 normal and Systolic murmur noted Abdomen: soft, non-tender; bowel sounds normal; no masses,  no organomegaly Extremities: extremities normal, atraumatic, no cyanosis or edema  Lab Results:  Recent Labs  03/04/15 0256 03/05/15 0339  WBC 10.1 9.4  HGB 10.8* 10.7*  PLT 296 284    Recent Labs  03/04/15 0256 03/05/15 0339  NA 134* 134*  K 4.2 4.9  CL 106 104  CO2 19* 22  GLUCOSE 89 90  BUN 24* 29*  CREATININE 1.96* 2.11*   No results for input(s): TROPONINI in the last 72 hours.  Invalid input(s): CK, MB Hepatic Function Panel No results for input(s): PROT, ALBUMIN, AST, ALT, ALKPHOS, BILITOT, BILIDIR, IBILI in the last 72 hours. No results for input(s): CHOL in the last 72 hours. No results for input(s): PROTIME in the last 72 hours.  Imaging: Imaging results have been reviewed and Ct Abdomen Pelvis Wo Contrast  03/04/2015   CLINICAL DATA:  History of bladder and prostate cancer status post cystoprostatectomy,  abnormal ultrasound with left hydronephrosis  EXAM: CT ABDOMEN AND PELVIS WITHOUT CONTRAST  TECHNIQUE: Multidetector CT imaging of the abdomen and pelvis was performed following the standard protocol without IV contrast.  COMPARISON:  Renal ultrasound dated 03/02/2015, CT abdomen dated 12/11/2014.  FINDINGS: Lower chest:  Mild nodular scarring at the right lung base.  Hepatobiliary: Unenhanced liver is unremarkable.  Gallbladder is underdistended. No intrahepatic or extrahepatic ductal dilatation.  Pancreas: Within normal limits.  Spleen: Within normal limits.  Adrenals/Urinary Tract: Adrenal glands are within normal limits.  Small right kidney.  No renal calculi or hydronephrosis.  1.5 cm cyst along the posterior interpolar left kidney (series 2/ image 32). Moderate left hydroureteronephrosis.  Status post cystoprostatectomy with right mid abdominal ileal conduit.  Left ureter is dilated to the level of the ureteral anastomosis (series 2/ image 55).  Stomach/Bowel: Stomach is notable for a small hiatal hernia.  No evidence of bowel obstruction.  Moderate colonic stool burden.  Vascular/Lymphatic: Atherosclerotic calcifications of the abdominal aorta and branch vessels. Mild ectasia of the infrarenal abdominal aorta, measuring up to 2.3 x 2.6 cm (series 2/ image 37).  No suspicious abdominopelvic lymphadenopathy.  Reproductive: Prostate is surgically absent, as above.  Other: No abdominopelvic ascites.  Fat within the left inguinal canal.  Musculoskeletal: Degenerative changes of the visualized thoracolumbar spine.  Very mild anterior wedging at T10.  IMPRESSION: Status post cystoprostatectomy.  No findings suspicious for metastatic disease.  Moderate left hydroureteronephrosis to the level of the ureteral anastomosis.   Electronically Signed   By: Henderson Newcomer.D.  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 Plan Continue present management Will schedule for PCI once cleared from urology point of view. Check labs in a.m.  LOS: 10 days    Charolette Forward 03/05/2015, 9:55 AM

## 2015-03-05 NOTE — Progress Notes (Addendum)
Subjective:  1 - Metastatic Bladder Cancer - s/p cystectomy with bilateral pelvic lymphadenectomy and ileal conduit urinary diversion 01/2015 for [id number] high-grade urothelial carcinoma. He had postive sentinal right peri-ureteral lymph node as well as margin-positive disease.  2 - Acute on Chronic Renal Insufficiency / Mild-Mod Left Hydronephrosis - baseline Cr 1.2's s/p cystectomy noted acute rise to 2.5 this admission, received angiography and Cr now around 2 and stable. Mod left hydro by [place] and dedicated CT to distal ureter / conduit. UOP excellent.  3 - E. Coli Bacteremia- 1 of 2 BCX positive for E Coli 02/23/15 this admission. Now on CX-specific Kefzol. F/U [facility] 6/17 now growth to date.  Today "Darren Carr" is w/o complaints. No CP. No furhter bloody emesis. He is pending cardiac cath / possible stenting.   Objective: Vital signs in last 24 hours: Temp:  [97.9 F (36.6 C)-98.4 F (36.9 C)] 98.4 F (36.9 C) (06/21 0357) Pulse Rate:  [57-62] 58 (06/20 1620) Resp:  [17-18] 18 (06/20 2200) BP: (75-106)/(50-77) 106/77 mmHg (06/20 2200) SpO2:  [96 %-97 %] 97 % (06/20 2200) Last BM Date: 03/03/15  Intake/Output from previous day: 06/20 0701 - 06/21 0700 In: 1340 [P.O.:1200; I.V.:40; IV Piggyback:100] Out: 2250 [Urine:2250] Intake/Output this shift: Total I/O In: 290 [P.O.:240; IV Piggyback:50] Out: 1000 [Urine:1000]  General appearance: alert, cooperative, appears stated age and wife at bedside Eyes: negative Nose: Nares normal. Septum midline. Mucosa normal. No drainage or sinus tenderness. Throat: lips, mucosa, and tongue normal; teeth and gums normal Neck: no adenopathy, no carotid bruit, no JVD, supple, symmetrical, trachea midline and thyroid not enlarged, symmetric, no tenderness/mass/nodules Back: symmetric, no curvature. ROM normal. No CVA tenderness. Resp: non-labored on room air Cardio: Nl rate GI: soft, non-tender; bowel sounds normal; no masses,  no organomegaly Extremities:  extremities normal, atraumatic, no cyanosis or edema Neurologic: Grossly normal Incision/Wound: RLQ Urostomy pink / patent with copious clear urine. 49F foley placed into conduit across fascial witih 5cc sterile water in balloon to maximie flow.   Lab Results:   Recent Labs  03/04/15 0256 03/05/15 0339  WBC 10.1 9.4  HGB 10.8* 10.7*  HCT 32.6* 32.5*  PLT 296 284   BMET  Recent Labs  03/04/15 0256 03/05/15 0339  NA 134* 134*  K 4.2 4.9  CL 106 104  CO2 19* 22  GLUCOSE 89 90  BUN 24* 29*  CREATININE 1.96* 2.11*  CALCIUM 7.7* 7.9*   PT/INR No results for input(s): LABPROT, INR in the last 72 hours. ABG No results for input(s): PHART, HCO3 in the last 72 hours.  Invalid input(s): PCO2, PO2  Studies/Results: Ct Abdomen Pelvis Wo Contrast  03/04/2015   CLINICAL DATA:  History of bladder and prostate cancer status post cystoprostatectomy, abnormal ultrasound with left hydronephrosis  EXAM: CT ABDOMEN AND PELVIS WITHOUT CONTRAST  TECHNIQUE: Multidetector CT imaging of the abdomen and pelvis was performed following the standard protocol without IV contrast.  COMPARISON:  Renal ultrasound dated 03/02/2015, CT abdomen dated 12/11/2014.  FINDINGS: Lower chest:  Mild nodular scarring at the right lung base.  Hepatobiliary: Unenhanced liver is unremarkable.  Gallbladder is underdistended. No intrahepatic or extrahepatic ductal dilatation.  Pancreas: Within normal limits.  Spleen: Within normal limits.  Adrenals/Urinary Tract: Adrenal glands are within normal limits.  Small right kidney.  No renal calculi or hydronephrosis.  1.5 cm cyst along the posterior interpolar left kidney (series 2/ image 32). Moderate left hydroureteronephrosis.  Status post cystoprostatectomy with right mid abdominal ileal conduit.  Left  ureter is dilated to the level of the ureteral anastomosis (series 2/ image 55).  Stomach/Bowel: Stomach is notable for a small hiatal hernia.  No evidence of bowel obstruction.   Moderate colonic stool burden.  Vascular/Lymphatic: Atherosclerotic calcifications of the abdominal aorta and branch vessels. Mild ectasia of the infrarenal abdominal aorta, measuring up to 2.3 x 2.6 cm (series 2/ image 37).  No suspicious abdominopelvic lymphadenopathy.  Reproductive: Prostate is surgically absent, as above.  Other: No abdominopelvic ascites.  Fat within the left inguinal canal.  Musculoskeletal: Degenerative changes of the visualized thoracolumbar spine.  Very mild anterior wedging at T10.  IMPRESSION: Status post cystoprostatectomy.  No findings suspicious for metastatic disease.  Moderate left hydroureteronephrosis to the level of the ureteral anastomosis.   Electronically Signed   By: Julian Hy M.D.   On: 03/04/2015 16:02    Anti-infectives: Anti-infectives    Start     Dose/Rate Route Frequency Ordered Stop   03/04/15 1400  ceFAZolin (ANCEF) IVPB 1 g/50 mL premix  Status:  Discontinued     1 g 100 mL/hr over 30 Minutes Intravenous 3 times per day 03/04/15 0911 03/04/15 0917   03/04/15 1400  ceFAZolin (ANCEF) IVPB 2 g/50 mL premix     2 g 100 mL/hr over 30 Minutes Intravenous 3 times per day 03/04/15 0919     02/28/15 1700  ceFAZolin (ANCEF) IVPB 2 g/50 mL premix  Status:  Discontinued     2 g 100 mL/hr over 30 Minutes Intravenous 3 times per day 02/28/15 1035 03/04/15 0911   02/27/15 1800  ceFAZolin (ANCEF) IVPB 2 g/50 mL premix  Status:  Discontinued     2 g 100 mL/hr over 30 Minutes Intravenous Every 12 hours 02/27/15 1136 02/28/15 1035   02/23/15 2300  vancomycin (VANCOCIN) IVPB 1000 mg/200 mL premix  Status:  Discontinued     1,000 mg 200 mL/hr over 60 Minutes Intravenous Every 24 hours 02/23/15 2225 02/27/15 1136   02/23/15 2230  piperacillin-tazobactam (ZOSYN) IVPB 3.375 g  Status:  Discontinued     3.375 g 12.5 mL/hr over 240 Minutes Intravenous 3 times per day 02/23/15 2225 02/27/15 1136      Assessment/Plan:  1 - Metastatic Bladder Cancer - long  term prognosis guarded, >2 year life expectancy would be unusual. Ideally he would receive adjuvant chemo but GFR would need to be better. He had malignant hydro pre-cystectomy as well.   2 - Acute on Chronic Renal Insufficiency / Mild-Mod Left Hydronephrosis - likely multifactorial from low perfusion state / MI + recent IV contrast + partial obstruction. Agree with goal to maximize renal function to make better cardiac stent and chemo candidate. As any new tubes / lines would likely cause significant bleeding in anticoagulated state, I feel ASAP nuc med renogram would be helpful. If demonstrates left obstruction, then left neph tube would be reasonable, preferably pre-cardiac stent / anticoagulation.   3 - E. Coli Bacteremia- probable GU source. Agree with current ABX and fortunately f/u CX's negative.   Will discuss with primary team this AM these considerations. Challenging situation.   [facility], Darren Carr 03/05/2015  Renogram today confirms left obstruction (dominant kidney). I feel left neph tube warranted to maximaize renal function prior to angioplasty / stent as well as prior to possible future chemo. Will request IR placement.

## 2015-03-06 ENCOUNTER — Inpatient Hospital Stay (HOSPITAL_COMMUNITY): Payer: BLUE CROSS/BLUE SHIELD

## 2015-03-06 DIAGNOSIS — N1330 Unspecified hydronephrosis: Secondary | ICD-10-CM | POA: Insufficient documentation

## 2015-03-06 LAB — CBC
HCT: 32.2 % — ABNORMAL LOW (ref 39.0–52.0)
Hemoglobin: 10.4 g/dL — ABNORMAL LOW (ref 13.0–17.0)
MCH: 29.3 pg (ref 26.0–34.0)
MCHC: 32.3 g/dL (ref 30.0–36.0)
MCV: 90.7 fL (ref 78.0–100.0)
Platelets: 264 10*3/uL (ref 150–400)
RBC: 3.55 MIL/uL — AB (ref 4.22–5.81)
RDW: 13.8 % (ref 11.5–15.5)
WBC: 8.4 10*3/uL (ref 4.0–10.5)

## 2015-03-06 LAB — APTT: APTT: 27 s (ref 24–37)

## 2015-03-06 LAB — PROTIME-INR
INR: 1.07 (ref 0.00–1.49)
PROTHROMBIN TIME: 14.1 s (ref 11.6–15.2)

## 2015-03-06 LAB — BASIC METABOLIC PANEL
Anion gap: 11 (ref 5–15)
BUN: 33 mg/dL — AB (ref 6–20)
CHLORIDE: 103 mmol/L (ref 101–111)
CO2: 19 mmol/L — ABNORMAL LOW (ref 22–32)
CREATININE: 2.34 mg/dL — AB (ref 0.61–1.24)
Calcium: 8 mg/dL — ABNORMAL LOW (ref 8.9–10.3)
GFR, EST AFRICAN AMERICAN: 33 mL/min — AB (ref >60)
GFR, EST NON AFRICAN AMERICAN: 28 mL/min — AB (ref >60)
Glucose, Bld: 87 mg/dL (ref 65–99)
POTASSIUM: 5.1 mmol/L (ref 3.5–5.1)
Sodium: 133 mmol/L — ABNORMAL LOW (ref 135–145)

## 2015-03-06 LAB — CULTURE, BLOOD (SINGLE): Culture: NO GROWTH

## 2015-03-06 MED ORDER — CEFAZOLIN SODIUM-DEXTROSE 2-3 GM-% IV SOLR
2.0000 g | Freq: Two times a day (BID) | INTRAVENOUS | Status: DC
  Administered 2015-03-06 – 2015-03-07 (×2): 2 g via INTRAVENOUS
  Filled 2015-03-06 (×3): qty 50

## 2015-03-06 MED ORDER — FENTANYL CITRATE (PF) 100 MCG/2ML IJ SOLN
INTRAMUSCULAR | Status: AC
  Filled 2015-03-06: qty 2

## 2015-03-06 MED ORDER — SODIUM CHLORIDE 0.9 % IV SOLN
INTRAVENOUS | Status: AC | PRN
  Administered 2015-03-06: 10 mL/h via INTRAVENOUS

## 2015-03-06 MED ORDER — FENTANYL CITRATE (PF) 100 MCG/2ML IJ SOLN
INTRAMUSCULAR | Status: AC | PRN
  Administered 2015-03-06: 50 ug via INTRAVENOUS
  Administered 2015-03-06 (×2): 25 ug via INTRAVENOUS

## 2015-03-06 MED ORDER — LIDOCAINE HCL 1 % IJ SOLN
INTRAMUSCULAR | Status: AC
  Filled 2015-03-06: qty 20

## 2015-03-06 MED ORDER — IOHEXOL 300 MG/ML  SOLN
30.0000 mL | Freq: Once | INTRAMUSCULAR | Status: AC | PRN
  Administered 2015-03-06: 30 mL

## 2015-03-06 MED ORDER — MIDAZOLAM HCL 2 MG/2ML IJ SOLN
INTRAMUSCULAR | Status: AC | PRN
  Administered 2015-03-06 (×3): 0.5 mg via INTRAVENOUS
  Administered 2015-03-06: 1 mg via INTRAVENOUS
  Administered 2015-03-06: 0.5 mg via INTRAVENOUS

## 2015-03-06 MED ORDER — MIDAZOLAM HCL 2 MG/2ML IJ SOLN
INTRAMUSCULAR | Status: AC
Start: 2015-03-06 — End: 2015-03-06
  Filled 2015-03-06: qty 4

## 2015-03-06 NOTE — Consult Note (Signed)
Chief Complaint: Chief Complaint  Patient presents with  . Code STEMI  L Hydronephrosis  Referring Physician(s): Manny  History of Present Illness: Darren Carr is a 64 y.o. male   Hx bladder cancer Cystectomy with ileal conduit 01/2015 Recent Code STEMI Acute renal function injury Cr 2.34 today-- continues to rise UOP good: 4 liters/24 hrs CT reveals B pelvic lymphadenopathy L hydronephrosis Request for L Percutaneous nephrostomy placement per Dr Tresa Moore Dr Annamaria Boots reviewed imaging and approves procedure Now scheduled for same  Past Medical History  Diagnosis Date  . Alcoholic   . Chronic back pain   . Upper GI bleed   . Bladder tumor 07-2014  . Hypertension     Past Surgical History  Procedure Laterality Date  . Appendectomy    . Esophagogastroduodenoscopy (egd) with propofol N/A 08/16/2013    Procedure: ESOPHAGOGASTRODUODENOSCOPY (EGD) WITH PROPOFOL Hiatus at 40cm Gastroesophageal Junction at 37cm;  Surgeon: Rogene Houston, MD;  Location: [facility];  Service: Endoscopy;  Laterality: N/A;  . Balloon dilation N/A 08/16/2013    Procedure: BALLOON DILATION to 16.34mm;  Surgeon: Rogene Houston, MD;  Location: [facility];  Service: Endoscopy;  Laterality: N/A;  . Esophageal biopsy  08/16/2013    Procedure: DISTAL ESOPHAGEAL BIOPSIES;  Surgeon: Rogene Houston, MD;  Location: [facility];  Service: Endoscopy;;  . Transurethral resection of prostate  07-2014,08-2014,09-2014  . Transurethral resection of bladder tumor with gyrus (turbt-gyrus) N/A 12/05/2014    Procedure: TRANSURETHRAL RESECTION OF BLADDER TUMOR WITH GYRUS (TURBT-GYRUS);  Surgeon: [name], MD;  Location: [facility];  Service: Urology;  Laterality: N/A;  . Cystoscopy w/ ureteral stent placement Bilateral 12/05/2014    Procedure: CYSTOSCOPY WITH BILATERAL RETROGRADE PYELOGRAM;  Surgeon: [name], MD;  Location: [facility];  Service: Urology;  Laterality: Bilateral;  . Robot assisted laparoscopic complete cystect ileal  conduit N/A 01/16/2015    Procedure: ROBOTIC ASSISTED LAPAROSCOPIC COMPLETE CYSTECT ILEAL CONDUIT/ROBOTIC ASSISTED LAPAROSCOPIC RADICAL PROSTATECTOMY;  Surgeon: [name], MD;  Location: [facility];  Service: Urology;  Laterality: N/A;  . Lymphadenectomy Bilateral 01/16/2015    Procedure: PELVIC LYMPH NODE DISSECTION;  Surgeon: [name], MD;  Location: [facility];  Service: Urology;  Laterality: Bilateral;  . Cystoscopy with injection N/A 01/16/2015    Procedure: CYSTOSCOPY WITH INJECTION OF INDOCYANINE GREEN DYE;  Surgeon: [name], MD;  Location: [facility];  Service: Urology;  Laterality: N/A;  . Cardiac catheterization N/A 02/23/2015    Procedure: Left Heart Cath and Coronary Angiography;  Surgeon: Charolette Forward, MD;  Location: [facility];  Service: Cardiovascular;  Laterality: N/A;  . Esophagogastroduodenoscopy N/A 02/24/2015    Procedure: ESOPHAGOGASTRODUODENOSCOPY (EGD);  Surgeon: Wilford Corner, MD;  Location: [facility];  Service: Endoscopy;  Laterality: N/A;    Allergies: Review of patient's allergies indicates no known allergies.  Medications: Prior to Admission medications   Medication Sig Start Date End Date Taking? Authorizing Provider  Oxycodone HCl 10 MG TABS Take 10-20 mg by mouth 4 (four) times daily.   Yes Historical Provider, MD  traMADol (ULTRAM) 50 MG tablet Take 50-100 mg by mouth every 6 (six) hours as needed for moderate pain or severe pain.   Yes Historical Provider, MD  HYDROmorphone (DILAUDID) 4 MG tablet Take 1-2 tablets (4-8 mg total) by mouth every 4 (four) hours as needed for moderate pain or severe pain. Post-operatively Patient not taking: Reported on 02/23/2015 01/22/15   [name], MD  lisinopril (PRINIVIL,ZESTRIL) 5 MG tablet Take 5 mg  by mouth daily.    Historical Provider, MD  omeprazole (PRILOSEC) 20 MG capsule Take 20 mg by mouth daily as needed (Heartburn).     Historical Provider, MD  senna-docusate (SENOKOT-S) 8.6-50 MG per tablet Take 1  tablet by mouth 2 (two) times daily. While taking pain meds to prevent constipation 01/22/15   [name], MD     History reviewed. No pertinent family history.  History   Social History  . Marital Status: Married    Spouse Name: N/A  . Number of Children: N/A  . Years of Education: N/A   Social History Main Topics  . Smoking status: Current Every Day Smoker -- 1.50 packs/day for 44 years    Types: Cigarettes  . Smokeless tobacco: Never Used     Comment: 1 1/2 pack a day  . Alcohol Use: Yes     Comment: 10-12 beers a day (12oz)   . Drug Use: No  . Sexual Activity: Yes    Birth Control/ Protection: None   Other Topics Concern  . None   Social History Narrative    Review of Systems: A 12 point ROS discussed and pertinent positives are indicated in the HPI above.  All other systems are negative.  Review of Systems  Constitutional: Negative for fever, activity change and appetite change.  Respiratory: Negative for shortness of breath.   Gastrointestinal: Negative for abdominal pain.  Genitourinary: Negative for flank pain.  Neurological: Negative for weakness.  Psychiatric/Behavioral: Negative for behavioral problems and confusion.    Vital Signs: BP 97/72 mmHg  Pulse 58  Temp(Src) 98.2 F (36.8 C) (Oral)  Resp 18  Ht 5\' 10"  (1.778 m)  Wt 146 lb 13.2 oz (66.6 kg)  BMI 21.07 kg/m2  SpO2 98%  Physical Exam  Constitutional: He is oriented to person, place, and time. He appears well-nourished.  Cardiovascular: Normal rate, regular rhythm and normal heart sounds.   No murmur heard. Pulmonary/Chest: Breath sounds normal. He has no wheezes.  Abdominal: Soft. Bowel sounds are normal. There is no tenderness.  Musculoskeletal: Normal range of motion.  Neurological: He is alert and oriented to person, place, and time.  Skin: Skin is warm and dry.  Psychiatric: He has a normal mood and affect. His behavior is normal. Judgment and thought content normal.  Nursing note  and vitals reviewed.   Mallampati Score:  MD Evaluation Airway: WNL Heart: WNL Abdomen: WNL Abdomen comments: diffusely tender with guarding, soft, nondistended, +BS Chest/ Lungs: WNL ASA  Classification: 3 Mallampati/Airway Score: One  Imaging: Ct Abdomen Pelvis Wo Contrast  03/04/2015   CLINICAL DATA:  History of bladder and prostate cancer status post cystoprostatectomy, abnormal ultrasound with left hydronephrosis  EXAM: CT ABDOMEN AND PELVIS WITHOUT CONTRAST  TECHNIQUE: Multidetector CT imaging of the abdomen and pelvis was performed following the standard protocol without IV contrast.  COMPARISON:  Renal ultrasound dated 03/02/2015, CT abdomen dated 12/11/2014.  FINDINGS: Lower chest:  Mild nodular scarring at the right lung base.  Hepatobiliary: Unenhanced liver is unremarkable.  Gallbladder is underdistended. No intrahepatic or extrahepatic ductal dilatation.  Pancreas: Within normal limits.  Spleen: Within normal limits.  Adrenals/Urinary Tract: Adrenal glands are within normal limits.  Small right kidney.  No renal calculi or hydronephrosis.  1.5 cm cyst along the posterior interpolar left kidney (series 2/ image 32). Moderate left hydroureteronephrosis.  Status post cystoprostatectomy with right mid abdominal ileal conduit.  Left ureter is dilated to the level of the ureteral anastomosis (series 2/ image  29).  Stomach/Bowel: Stomach is notable for a small hiatal hernia.  No evidence of bowel obstruction.  Moderate colonic stool burden.  Vascular/Lymphatic: Atherosclerotic calcifications of the abdominal aorta and branch vessels. Mild ectasia of the infrarenal abdominal aorta, measuring up to 2.3 x 2.6 cm (series 2/ image 37).  No suspicious abdominopelvic lymphadenopathy.  Reproductive: Prostate is surgically absent, as above.  Other: No abdominopelvic ascites.  Fat within the left inguinal canal.  Musculoskeletal: Degenerative changes of the visualized thoracolumbar spine.  Very mild  anterior wedging at T10.  IMPRESSION: Status post cystoprostatectomy.  No findings suspicious for metastatic disease.  Moderate left hydroureteronephrosis to the level of the ureteral anastomosis.   Electronically Signed   By: Julian Hy M.D.   On: 03/04/2015 16:02   Nm Renal Imag Wo/w Pharm  03/05/2015   CLINICAL DATA:  Hydronephrosis, bladder cancer post cystectomy  EXAM: NUCLEAR MEDICINE RENAL SCAN WITH DIURETIC ADMINISTRATION  TECHNIQUE: Radionuclide angiographic and sequential renal images were obtained after intravenous injection of radiopharmaceutical. Imaging was continued during slow intravenous injection of Lasix approximately 15 minutes after the start of the examination.  RADIOPHARMACEUTICALS:  15 mCi Technetium-87m MAG3 IV  Pharmaceutical:  33 mg Lasix IV  COMPARISON:  CT abdomen and pelvis 03/04/2015  FINDINGS: Flow: Normal blood flow to LEFT kidney. Normal delivery of tracer is seen to the RIGHT kidney, which appears small and atrophic. BILATERAL renal flow curves show slightly better up slope of flow to LEFT kidney than RIGHT.  Left renogram: Normal uptake, concentration and excretion of tracer. Mild dilatation of LEFT renal collecting system with persistent visualization of the LEFT renal collecting system and LEFT ureter extending to urinary diversion in the RIGHT lower quadrant. No fall to half maximum activity is identified at the LEFT kidney. Delayed time to peak activity of 19.4 minutes.  Right renogram: Poor uptake, concentration is creation true of tracer by a small atrophic appearing RIGHT kidney. Mild excretion of tracer is seen into a mildly dilated RIGHT renal collecting system. Tracer is identified within a prominent RIGHT ureter. Renogram curve shows gradually accumulating tracer activity within the RIGHT kidney peaking at 22 minutes. Minimal fall in tracer following diuresis, failing to fall to half maximum activity.  Differential:  Left kidney = 90 %  Right kidney = 10 %   T1/2 post Lasix :  Left kidney = fails to fall to half maximum  Right kidney = fails to fall to half maximum  IMPRESSION: Markedly atrophic RIGHT kidney, small and contributing only 10% of differential renal function versus 90% by LEFT kidney.  Both kidneys demonstrate dilatation of the renal collecting systems and likely of the ureters bilaterally, corresponding to findings on recent CT.  Both kidneys demonstrate a failure to fall to half maximum tracer activity following diuresis consistent with a degree of urinary outflow obstruction bilaterally.   Electronically Signed   By: Lavonia Dana M.D.   On: 03/05/2015 14:59   US Renal  03/02/2015   CLINICAL DATA:  Follow-up chronic right-sided hydronephrosis. Subsequent encounter.  EXAM: RENAL / URINARY TRACT ULTRASOUND COMPLETE  COMPARISON:  CT of the abdomen and pelvis performed 12/11/2014  FINDINGS: Right Kidney:  Length: 8.8 cm. Increased parenchymal echogenicity noted, with cortical thinning and atrophy. No mass or hydronephrosis visualized.  Left Kidney:  Length: 13.0 cm. Echogenicity within normal limits. Mild-to-moderate left-sided hydronephrosis is noted. No mass seen.  Bladder:  The patient is status post ileal conduit.  IMPRESSION: 1. Mild-to-moderate left-sided hydronephrosis, new from the  prior study. This could reflect distal obstruction. Would correlate for associated symptoms. 2. Mild right renal atrophy, with increased parenchymal echogenicity, likely reflecting underlying medical renal disease. Previously noted right-sided hydronephrosis has resolved.   Electronically Signed   By: Garald Balding M.D.   On: 03/02/2015 08:02   Dg Chest Port 1 View  02/23/2015   CLINICAL DATA:  Chest pain and vomiting. Recent cardiac catheterization.  EXAM: PORTABLE CHEST - 1 VIEW  COMPARISON:  12/03/2014  FINDINGS: A single AP portable view of the chest demonstrates no focal airspace consolidation or alveolar edema. The lungs are grossly clear. There is no large  effusion or pneumothorax. Cardiac and mediastinal contours appear unremarkable.  IMPRESSION: No active disease.   Electronically Signed   By: Andreas Newport M.D.   On: 02/23/2015 21:00    Labs:  CBC:  Recent Labs  03/02/15 0256 03/04/15 0256 03/05/15 0339 03/06/15 0317  WBC 9.1 10.1 9.4 8.4  HGB 11.1* 10.8* 10.7* 10.4*  HCT 33.2* 32.6* 32.5* 32.2*  PLT 389 296 284 264    COAGS:  Recent Labs  02/23/15 1519 03/06/15 0317  INR 1.19 1.07  APTT 22* 27    BMP:  Recent Labs  03/02/15 0256 03/04/15 0256 03/05/15 0339 03/06/15 0317  NA 136 134* 134* 133*  K 4.0 4.2 4.9 5.1  CL 100* 106 104 103  CO2 26 19* 22 19*  GLUCOSE 109* 89 90 87  BUN 21* 24* 29* 33*  CALCIUM 7.8* 7.7* 7.9* 8.0*  CREATININE 1.92* 1.96* 2.11* 2.34*  GFRNONAA 36* 35* 32* 28*  GFRAA 41* 40* 37* 33*    LIVER FUNCTION TESTS:  Recent Labs  02/23/15 1519 02/23/15 1925 02/28/15 0302  BILITOT 0.6 0.5 0.9  AST 93* 117* 32  ALT 71* 71* 35  ALKPHOS 204* 185* 175*  PROT 7.5 6.7 6.7  ALBUMIN 2.3* 2.2* 1.9*    TUMOR MARKERS: No results for input(s): AFPTM, CEA, CA199, CHROMGRNA in the last 8760 hours.  Assessment and Plan:  Bladder Ca Cystectomy/ileal conduit 01/2015 B pelvic LAN L hydronephrosis Rising Creatinine Scheduled for L Percutaneous nephrostomy tube placement Risks and Benefits discussed with the patient including, but not limited to infection, bleeding, significant bleeding causing loss or decrease in renal function or damage to adjacent structures.  All of the patient's questions were answered, patient is agreeable to proceed. Consent signed and in chart.   Thank you for this interesting consult.  I greatly enjoyed meeting [employer] and look forward to participating in their care.  Signed: Cerise Lieber A 03/06/2015, 8:17 AM   I spent a total of 40 Minutes    in face to face in clinical consultation, greater than 50% of which was counseling/coordinating care for L  PCN

## 2015-03-06 NOTE — Progress Notes (Signed)
Subjective:  Patient denies any chest pain or shortness of breath. Scheduled for left nephrostomy today. His renal function gradually getting worse. Discussed with patient regarding various options of treatment i.e. medical for now once his renal function and hemoglobin stabilizes and then consider elective PCI versus PTCA stenting its risk and benefits, will discuss with his wife and decide further.  Objective:  Vital Signs in the last 24 hours: Temp:  [97.8 F (36.6 C)-98.8 F (37.1 C)] 98.2 F (36.8 C) (06/22 0724) Pulse Rate:  [58-70] 58 (06/22 0444) Resp:  [17-18] 18 (06/22 0724) BP: (89-101)/(59-72) 97/72 mmHg (06/22 0724) SpO2:  [96 %-99 %] 98 % (06/22 0724)  Intake/Output from previous day: 06/21 0701 - 06/22 0700 In: 750 [P.O.:600; IV Piggyback:150] Out: 4250 [Urine:4250] Intake/Output from this shift:    Physical Exam: Neck: no adenopathy, no carotid bruit, no JVD and supple, symmetrical, trachea midline Lungs: clear to auscultation bilaterally Heart: regular rate and rhythm, S1, S2 normal and Soft systolic murmur noted no S3 gallop Abdomen: soft, non-tender; bowel sounds normal; no masses,  no organomegaly Extremities: extremities normal, atraumatic, no cyanosis or edema  Lab Results:  Recent Labs  03/05/15 0339 03/06/15 0317  WBC 9.4 8.4  HGB 10.7* 10.4*  PLT 284 264    Recent Labs  03/05/15 0339 03/06/15 0317  NA 134* 133*  K 4.9 5.1  CL 104 103  CO2 22 19*  GLUCOSE 90 87  BUN 29* 33*  CREATININE 2.11* 2.34*   No results for input(s): TROPONINI in the last 72 hours.  Invalid input(s): CK, MB Hepatic Function Panel No results for input(s): PROT, ALBUMIN, AST, ALT, ALKPHOS, BILITOT, BILIDIR, IBILI in the last 72 hours. No results for input(s): CHOL in the last 72 hours. No results for input(s): PROTIME in the last 72 hours.  Imaging: Imaging results have been reviewed and Ct Abdomen Pelvis Wo Contrast  03/04/2015   CLINICAL DATA:  History of  bladder and prostate cancer status post cystoprostatectomy, abnormal ultrasound with left hydronephrosis  EXAM: CT ABDOMEN AND PELVIS WITHOUT CONTRAST  TECHNIQUE: Multidetector CT imaging of the abdomen and pelvis was performed following the standard protocol without IV contrast.  COMPARISON:  Renal ultrasound dated 03/02/2015, CT abdomen dated 12/11/2014.  FINDINGS: Lower chest:  Mild nodular scarring at the right lung base.  Hepatobiliary: Unenhanced liver is unremarkable.  Gallbladder is underdistended. No intrahepatic or extrahepatic ductal dilatation.  Pancreas: Within normal limits.  Spleen: Within normal limits.  Adrenals/Urinary Tract: Adrenal glands are within normal limits.  Small right kidney.  No renal calculi or hydronephrosis.  1.5 cm cyst along the posterior interpolar left kidney (series 2/ image 32). Moderate left hydroureteronephrosis.  Status post cystoprostatectomy with right mid abdominal ileal conduit.  Left ureter is dilated to the level of the ureteral anastomosis (series 2/ image 55).  Stomach/Bowel: Stomach is notable for a small hiatal hernia.  No evidence of bowel obstruction.  Moderate colonic stool burden.  Vascular/Lymphatic: Atherosclerotic calcifications of the abdominal aorta and branch vessels. Mild ectasia of the infrarenal abdominal aorta, measuring up to 2.3 x 2.6 cm (series 2/ image 37).  No suspicious abdominopelvic lymphadenopathy.  Reproductive: Prostate is surgically absent, as above.  Other: No abdominopelvic ascites.  Fat within the left inguinal canal.  Musculoskeletal: Degenerative changes of the visualized thoracolumbar spine.  Very mild anterior wedging at T10.  IMPRESSION: Status post cystoprostatectomy.  No findings suspicious for metastatic disease.  Moderate left hydroureteronephrosis to the level of the ureteral anastomosis.  Electronically Signed   By: Julian Hy M.D.   On: 03/04/2015 16:02   Nm Renal Imag Wo/w Pharm  03/05/2015   CLINICAL DATA:   Hydronephrosis, bladder cancer post cystectomy  EXAM: NUCLEAR MEDICINE RENAL SCAN WITH DIURETIC ADMINISTRATION  TECHNIQUE: Radionuclide angiographic and sequential renal images were obtained after intravenous injection of radiopharmaceutical. Imaging was continued during slow intravenous injection of Lasix approximately 15 minutes after the start of the examination.  RADIOPHARMACEUTICALS:  15 mCi Technetium-33m MAG3 IV  Pharmaceutical:  33 mg Lasix IV  COMPARISON:  CT abdomen and pelvis 03/04/2015  FINDINGS: Flow: Normal blood flow to LEFT kidney. Normal delivery of tracer is seen to the RIGHT kidney, which appears small and atrophic. BILATERAL renal flow curves show slightly better up slope of flow to LEFT kidney than RIGHT.  Left renogram: Normal uptake, concentration and excretion of tracer. Mild dilatation of LEFT renal collecting system with persistent visualization of the LEFT renal collecting system and LEFT ureter extending to urinary diversion in the RIGHT lower quadrant. No fall to half maximum activity is identified at the LEFT kidney. Delayed time to peak activity of 19.4 minutes.  Right renogram: Poor uptake, concentration is creation true of tracer by a small atrophic appearing RIGHT kidney. Mild excretion of tracer is seen into a mildly dilated RIGHT renal collecting system. Tracer is identified within a prominent RIGHT ureter. Renogram curve shows gradually accumulating tracer activity within the RIGHT kidney peaking at 22 minutes. Minimal fall in tracer following diuresis, failing to fall to half maximum activity.  Differential:  Left kidney = 90 %  Right kidney = 10 %  T1/2 post Lasix :  Left kidney = fails to fall to half maximum  Right kidney = fails to fall to half maximum  IMPRESSION: Markedly atrophic RIGHT kidney, small and contributing only 10% of differential renal function versus 90% by LEFT kidney.  Both kidneys demonstrate dilatation of the renal collecting systems and likely of the  ureters bilaterally, corresponding to findings on recent CT.  Both kidneys demonstrate a failure to fall to half maximum tracer activity following diuresis consistent with a degree of urinary outflow obstruction bilaterally.   Electronically Signed   By: Lavonia Dana M.D.   On: 03/05/2015 14:59    Cardiac Studies:  Assessment/Plan:  Status post acute anterolateral wall MI Status post acute upper GI bleed Multivessel CAD Hypertension Hypercholesteremia History of EtOH abuse History of esophageal stricture status post dilatation in the past Status post EGD antral gastritis Resolving acute renal injury High-grade bladder cancer status post radical cystoprostatectomy dominant with bilateral pelvic lymphadenectomy and ileal conduit urinary diversion History of right malignant hydronephrosis Resolving Escherichia coli bacteremia Resolving urosepsis Probable left obstructive uropathy. Plan Continue present management Schedule for left nephrostomy tube today Monitor renal function in a.m. Will discuss with patient and family further regarding further treatment  LOS: 11 days    Charolette Forward 03/06/2015, 8:01 AM

## 2015-03-06 NOTE — Procedures (Signed)
Interventional Radiology Procedure Note  Procedure: Left percutaneous nephrostomy tube for hydro.  History of bladder resection and urostomy.  Complications: None Recommendations:    - Routine care  - follow urine output  Signed,  Dulcy Fanny. Earleen Newport, DO

## 2015-03-06 NOTE — Progress Notes (Signed)
Subjective:  1 - Metastatic Bladder Cancer - s/p cystectomy with bilateral pelvic lymphadenectomy and ileal conduit urinary diversion 01/2015 for pT3N1Mx high-grade urothelial carcinoma. He had postive sentinal right peri-ureteral lymph node as well as margin-positive disease.  2 - Acute on Chronic Renal Insufficiency / Mild-Mod Left Hydronephrosis - s/p left nephrostomy tube 6/22 for new left hydro of dominant kidney and rise in Cr to 2's from baseline Cr 1.2's. Renogram 6/21 confirmed obstructive.   3 - E. Coli Bacteremia- 1 of 2 BCX positive for E Coli 02/23/15 this admission. Now on CX-specific Kefzol. F/U [facility] 6/17 negative.  Today "Darren Carr" is stable. Renogram yesterday confirmed obstruction of dominant left kidney and left neph tube placed earlier today and output is copious.  Objective: Vital signs in last 24 hours: Temp:  [97.8 F (36.6 C)-98.8 F (37.1 C)] 97.9 F (36.6 C) (06/22 1236) Pulse Rate:  [58-72] 64 (06/22 1430) Resp:  [12-20] 20 (06/22 1236) BP: (89-112)/(59-89) 102/76 mmHg (06/22 1430) SpO2:  [96 %-100 %] 100 % (06/22 1430) Last BM Date: 03/03/15  Intake/Output from previous day: 06/21 0701 - 06/22 0700 In: 750 [P.O.:600; IV Piggyback:150] Out: 4250 [Urine:4250] Intake/Output this shift: Total I/O In: 480 [P.O.:480] Out: 1050 [Urine:1050]  General appearance: alert, cooperative and appears stated age Head: Normocephalic, without obvious abnormality, atraumatic Nose: Nares normal. Septum midline. Mucosa normal. No drainage or sinus tenderness. Throat: lips, mucosa, and tongue normal; teeth and gums normal Neck: supple, symmetrical, trachea midline Back: left neph tube in place w/o hematomas. copious yellow-tea colored urine. Resp: non-labored on room air Cardio: Nl rate by bedside monitor Male genitalia: normal Extremities: extremities normal, atraumatic, no cyanosis or edema Lymph nodes: Cervical, supraclavicular, and axillary nodes normal. Neurologic:  Grossly normal  RLQ Urostomy pink / patent with yellow urine in appliance.  Lab Results:   Recent Labs  03/05/15 0339 03/06/15 0317  WBC 9.4 8.4  HGB 10.7* 10.4*  HCT 32.5* 32.2*  PLT 284 264   BMET  Recent Labs  03/05/15 0339 03/06/15 0317  NA 134* 133*  K 4.9 5.1  CL 104 103  CO2 22 19*  GLUCOSE 90 87  BUN 29* 33*  CREATININE 2.11* 2.34*  CALCIUM 7.9* 8.0*   PT/INR  Recent Labs  03/06/15 0317  LABPROT 14.1  INR 1.07   ABG No results for input(s): PHART, HCO3 in the last 72 hours.  Invalid input(s): PCO2, PO2  Studies/Results: Ct Abdomen Pelvis Wo Contrast  03/04/2015   CLINICAL DATA:  History of bladder and prostate cancer status post cystoprostatectomy, abnormal ultrasound with left hydronephrosis  EXAM: CT ABDOMEN AND PELVIS WITHOUT CONTRAST  TECHNIQUE: Multidetector CT imaging of the abdomen and pelvis was performed following the standard protocol without IV contrast.  COMPARISON:  Renal ultrasound dated 03/02/2015, CT abdomen dated 12/11/2014.  FINDINGS: Lower chest:  Mild nodular scarring at the right lung base.  Hepatobiliary: Unenhanced liver is unremarkable.  Gallbladder is underdistended. No intrahepatic or extrahepatic ductal dilatation.  Pancreas: Within normal limits.  Spleen: Within normal limits.  Adrenals/Urinary Tract: Adrenal glands are within normal limits.  Small right kidney.  No renal calculi or hydronephrosis.  1.5 cm cyst along the posterior interpolar left kidney (series 2/ image 32). Moderate left hydroureteronephrosis.  Status post cystoprostatectomy with right mid abdominal ileal conduit.  Left ureter is dilated to the level of the ureteral anastomosis (series 2/ image 55).  Stomach/Bowel: Stomach is notable for a small hiatal hernia.  No evidence of bowel obstruction.  Moderate  colonic stool burden.  Vascular/Lymphatic: Atherosclerotic calcifications of the abdominal aorta and branch vessels. Mild ectasia of the infrarenal abdominal aorta,  measuring up to 2.3 x 2.6 cm (series 2/ image 37).  No suspicious abdominopelvic lymphadenopathy.  Reproductive: Prostate is surgically absent, as above.  Other: No abdominopelvic ascites.  Fat within the left inguinal canal.  Musculoskeletal: Degenerative changes of the visualized thoracolumbar spine.  Very mild anterior wedging at T10.  IMPRESSION: Status post cystoprostatectomy.  No findings suspicious for metastatic disease.  Moderate left hydroureteronephrosis to the level of the ureteral anastomosis.   Electronically Signed   By: Julian Hy M.D.   On: 03/04/2015 16:02   Nm Renal Imag Wo/w Pharm  03/05/2015   CLINICAL DATA:  Hydronephrosis, bladder cancer post cystectomy  EXAM: NUCLEAR MEDICINE RENAL SCAN WITH DIURETIC ADMINISTRATION  TECHNIQUE: Radionuclide angiographic and sequential renal images were obtained after intravenous injection of radiopharmaceutical. Imaging was continued during slow intravenous injection of Lasix approximately 15 minutes after the start of the examination.  RADIOPHARMACEUTICALS:  15 mCi Technetium-59m MAG3 IV  Pharmaceutical:  33 mg Lasix IV  COMPARISON:  CT abdomen and pelvis 03/04/2015  FINDINGS: Flow: Normal blood flow to LEFT kidney. Normal delivery of tracer is seen to the RIGHT kidney, which appears small and atrophic. BILATERAL renal flow curves show slightly better up slope of flow to LEFT kidney than RIGHT.  Left renogram: Normal uptake, concentration and excretion of tracer. Mild dilatation of LEFT renal collecting system with persistent visualization of the LEFT renal collecting system and LEFT ureter extending to urinary diversion in the RIGHT lower quadrant. No fall to half maximum activity is identified at the LEFT kidney. Delayed time to peak activity of 19.4 minutes.  Right renogram: Poor uptake, concentration is creation true of tracer by a small atrophic appearing RIGHT kidney. Mild excretion of tracer is seen into a mildly dilated RIGHT renal  collecting system. Tracer is identified within a prominent RIGHT ureter. Renogram curve shows gradually accumulating tracer activity within the RIGHT kidney peaking at 22 minutes. Minimal fall in tracer following diuresis, failing to fall to half maximum activity.  Differential:  Left kidney = 90 %  Right kidney = 10 %  T1/2 post Lasix :  Left kidney = fails to fall to half maximum  Right kidney = fails to fall to half maximum  IMPRESSION: Markedly atrophic RIGHT kidney, small and contributing only 10% of differential renal function versus 90% by LEFT kidney.  Both kidneys demonstrate dilatation of the renal collecting systems and likely of the ureters bilaterally, corresponding to findings on recent CT.  Both kidneys demonstrate a failure to fall to half maximum tracer activity following diuresis consistent with a degree of urinary outflow obstruction bilaterally.   Electronically Signed   By: Lavonia Dana M.D.   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  He has been referred for evaluation for percutaneous nephrostomy tube in order to salvage kidney function.  EXAM: IR NEPHROSTOMY PLACEMENT LEFT  MEDICATIONS: 3.0 mg Versed, 100 mcg fentanyl  35 minutes moderate sedation time  CONTRAST:  98mL OMNIPAQUE IOHEXOL 300 MG/ML  SOLN  FLUOROSCOPY TIME:  Fluoroscopy Time (in minutes and seconds): 2 minutes 54 seconds  PROCEDURE: The procedure, risks, benefits, and alternatives were explained to the patient. Questions  regarding the procedure were encouraged and answered. The patient understands and consents to the procedure.  Patient is position in the its prone position on the fluoroscopy table, with ultrasound survey of the left flank performed and images stored sent to PACs.  Once the patient is  prepped and draped in the usual sterile fashion, the skin and subcutaneous tissues of the left flank were generously infiltrated with 1% lidocaine for local anesthesia.  Ultrasound guidance was used to access the collecting system with a 20 gauge Chiba needle, with opacification the collecting system. Small amount of air was infused to identify a posterior calyx.  The posterior calyx was then accessed with a second stick technique with a [place] needle.  Once the Chiba needle was confirmed in the calyx, contrast confirmed position.  Micro wire was advanced into the collecting system, and then an Accustick triaxial system was advanced over the micro wire.  Subsequent exchange is performed yielding a 10 French pigtail catheter within the collecting system via a posterior interpolar calyx.  Patient tolerated procedure well and remained hemodynamically stable throughout.  No complications were encountered and no significant blood loss was encounter.  COMPLICATIONS: None  FINDINGS: Left-sided hydronephrosis of the ultrasound survey.  Final images demonstrate safe placement of 10 French pigtail catheter into the collecting system via a posterior, interpolar calyx.  IMPRESSION: Status post left-sided percutaneous nephrostomy tube.  Signed,  Dulcy Fanny. Earleen Newport, DO  [facility]  [facility]   Electronically Signed   By: Corrie Mckusick D.O.   On: 03/06/2015 12:50    Anti-infectives: Anti-infectives    Start     Dose/Rate Route Frequency Ordered Stop   03/06/15 1759  ceFAZolin (ANCEF) IVPB 2 g/50 mL premix     2 g 100 mL/hr over 30 Minutes Intravenous Every 12 hours 03/06/15 1132     03/04/15 1400  ceFAZolin (ANCEF) IVPB 1 g/50 mL premix  Status:  Discontinued     1 g 100 mL/hr over 30 Minutes Intravenous 3 times per day 03/04/15 0911 03/04/15 0917   03/04/15 1400  ceFAZolin (ANCEF) IVPB 2 g/50 mL premix  Status:  Discontinued     2 g 100 mL/hr over 30 Minutes  Intravenous 3 times per day 03/04/15 0919 03/06/15 1132   02/28/15 1700  ceFAZolin (ANCEF) IVPB 2 g/50 mL premix  Status:  Discontinued     2 g 100 mL/hr over 30 Minutes Intravenous 3 times per day 02/28/15 1035 03/04/15 0911   02/27/15 1800  ceFAZolin (ANCEF) IVPB 2 g/50 mL premix  Status:  Discontinued     2 g 100 mL/hr over 30 Minutes Intravenous Every 12 hours 02/27/15 1136 02/28/15 1035   02/23/15 2300  vancomycin (VANCOCIN) IVPB 1000 mg/200 mL premix  Status:  Discontinued     1,000 mg 200 mL/hr over 60 Minutes Intravenous Every 24 hours 02/23/15 2225 02/27/15 1136   02/23/15 2230  piperacillin-tazobactam (ZOSYN) IVPB 3.375 g  Status:  Discontinued     3.375 g 12.5 mL/hr over 240 Minutes Intravenous 3 times per day 02/23/15 2225 02/27/15 1136      Assessment/Plan:  1 - Metastatic Bladder Cancer - long term prognosis guarded, >2 year life expectancy would be unusual. Ideally he would receive adjuvant chemo but GFR would need to be better. He had malignant hydro pre-cystectomy as well.   2 - Acute on Chronic Renal Insufficiency / Mild-Mod Left Hydronephrosis - now s/p left neph tube to maximize function of  dominant kidney. I am cationsly optimistic his GFR will improve. This will certainly make him better candidate for PCI and future chemo.   3 - E. Coli Bacteremia- probable GU source. Agree with current ABX and fortunately f/u CX's negative.   [facility], Aivan Fillingim 03/06/2015

## 2015-03-06 NOTE — Clinical Documentation Improvement (Addendum)
Query #1 Patient was admitted with an anterolateral MI on 02/23/15 requiring urgent left heart catheterization.  "Resolving urosepsis" and "Resolving Ecoli bacteremia" is documented by Dr. Terrence Dupont beginning with progress note 03/01/15.  Patient had blood cultures drawn on admission with 1 of 2 positive for Ecoli.  A urine culture was not obtained this admission.  WBC on admission was 19.8.  Lactic acid on admission was 2.5, with a repeat lactic acid 3 hours later of 1.5.  No documented fever this admission but patient does have a recent diagnosis of cancer with surgery in May 2016.  A repeat blood culture drawn on 03/01/15 is negative.   Please clarify the diagnosis of urosepsis in the progress notes and discharge summary:   - UTI without Sepsis, Present on Admission  - UTI with Ecoli bacteremia, Present on Admission  - UTI with Sepsis, Present on Admission  - Other condition  - Unable to clinically determine   Thank You, Erling Conte ,RN Clinical Documentation Specialist:  [place] [facility]

## 2015-03-07 LAB — BASIC METABOLIC PANEL
Anion gap: 9 (ref 5–15)
BUN: 30 mg/dL — AB (ref 6–20)
CO2: 22 mmol/L (ref 22–32)
CREATININE: 2.09 mg/dL — AB (ref 0.61–1.24)
Calcium: 8.4 mg/dL — ABNORMAL LOW (ref 8.9–10.3)
Chloride: 102 mmol/L (ref 101–111)
GFR calc Af Amer: 37 mL/min — ABNORMAL LOW (ref >60)
GFR, EST NON AFRICAN AMERICAN: 32 mL/min — AB (ref >60)
GLUCOSE: 94 mg/dL (ref 65–99)
POTASSIUM: 5 mmol/L (ref 3.5–5.1)
SODIUM: 133 mmol/L — AB (ref 135–145)

## 2015-03-07 LAB — CBC
HEMATOCRIT: 32.7 % — AB (ref 39.0–52.0)
HEMOGLOBIN: 10.7 g/dL — AB (ref 13.0–17.0)
MCH: 29.3 pg (ref 26.0–34.0)
MCHC: 32.7 g/dL (ref 30.0–36.0)
MCV: 89.6 fL (ref 78.0–100.0)
Platelets: 256 10*3/uL (ref 150–400)
RBC: 3.65 MIL/uL — AB (ref 4.22–5.81)
RDW: 13.9 % (ref 11.5–15.5)
WBC: 9 10*3/uL (ref 4.0–10.5)

## 2015-03-07 MED ORDER — CARVEDILOL 3.125 MG PO TABS
3.1250 mg | ORAL_TABLET | Freq: Two times a day (BID) | ORAL | Status: DC
  Administered 2015-03-07 – 2015-03-09 (×3): 3.125 mg via ORAL
  Filled 2015-03-07 (×6): qty 1

## 2015-03-07 MED ORDER — CEFAZOLIN SODIUM-DEXTROSE 2-3 GM-% IV SOLR
2.0000 g | Freq: Two times a day (BID) | INTRAVENOUS | Status: DC
  Administered 2015-03-07 – 2015-03-09 (×4): 2 g via INTRAVENOUS
  Filled 2015-03-07 (×5): qty 50

## 2015-03-07 NOTE — Progress Notes (Signed)
Subjective:  Patient denies any chest pain or shortness of breath. Tolerated left nephrostomy. Renal function slowly improving.  Objective:  Vital Signs in the last 24 hours: Temp:  [97.3 F (36.3 C)-99 F (37.2 C)] 97.3 F (36.3 C) (06/23 0749) Pulse Rate:  [61-84] 71 (06/23 0822) Resp:  [12-20] 18 (06/23 0749) BP: (87-112)/(62-89) 87/62 mmHg (06/23 0749) SpO2:  [97 %-100 %] 100 % (06/23 0749)  Intake/Output from previous day: 06/22 0701 - 06/23 0700 In: 940 [P.O.:840; IV Piggyback:100] Out: 3200 [Urine:3200] Intake/Output from this shift: Total I/O In: 240 [P.O.:240] Out: 375 [Urine:375]  Physical Exam: Neck: no adenopathy, no carotid bruit, no JVD and supple, symmetrical, trachea midline Lungs: clear to auscultation bilaterally Heart: regular rate and rhythm, S1, S2 normal and Soft systolic murmur noted Abdomen: soft, non-tender; bowel sounds normal; no masses,  no organomegaly Extremities: extremities normal, atraumatic, no cyanosis or edema  Lab Results:  Recent Labs  03/06/15 0317 03/07/15 0235  WBC 8.4 9.0  HGB 10.4* 10.7*  PLT 264 256    Recent Labs  03/06/15 0317 03/07/15 0235  NA 133* 133*  K 5.1 5.0  CL 103 102  CO2 19* 22  GLUCOSE 87 94  BUN 33* 30*  CREATININE 2.34* 2.09*   No results for input(s): TROPONINI in the last 72 hours.  Invalid input(s): CK, MB Hepatic Function Panel No results for input(s): PROT, ALBUMIN, AST, ALT, ALKPHOS, BILITOT, BILIDIR, IBILI in the last 72 hours. No results for input(s): CHOL in the last 72 hours. No results for input(s): PROTIME in the last 72 hours.  Imaging: Imaging results have been reviewed and Nm Renal Imag Wo/w Pharm  03/05/2015   CLINICAL DATA:  Hydronephrosis, bladder cancer post cystectomy  EXAM: NUCLEAR MEDICINE RENAL SCAN WITH DIURETIC ADMINISTRATION  TECHNIQUE: Radionuclide angiographic and sequential renal images were obtained after intravenous injection of radiopharmaceutical. Imaging was  continued during slow intravenous injection of Lasix approximately 15 minutes after the start of the examination.  RADIOPHARMACEUTICALS:  15 mCi Technetium-20m MAG3 IV  Pharmaceutical:  33 mg Lasix IV  COMPARISON:  CT abdomen and pelvis 03/04/2015  FINDINGS: Flow: Normal blood flow to LEFT kidney. Normal delivery of tracer is seen to the RIGHT kidney, which appears small and atrophic. BILATERAL renal flow curves show slightly better up slope of flow to LEFT kidney than RIGHT.  Left renogram: Normal uptake, concentration and excretion of tracer. Mild dilatation of LEFT renal collecting system with persistent visualization of the LEFT renal collecting system and LEFT ureter extending to urinary diversion in the RIGHT lower quadrant. No fall to half maximum activity is identified at the LEFT kidney. Delayed time to peak activity of 19.4 minutes.  Right renogram: Poor uptake, concentration is creation true of tracer by a small atrophic appearing RIGHT kidney. Mild excretion of tracer is seen into a mildly dilated RIGHT renal collecting system. Tracer is identified within a prominent RIGHT ureter. Renogram curve shows gradually accumulating tracer activity within the RIGHT kidney peaking at 22 minutes. Minimal fall in tracer following diuresis, failing to fall to half maximum activity.  Differential:  Left kidney = 90 %  Right kidney = 10 %  T1/2 post Lasix :  Left kidney = fails to fall to half maximum  Right kidney = fails to fall to half maximum  IMPRESSION: Markedly atrophic RIGHT kidney, small and contributing only 10% of differential renal function versus 90% by LEFT kidney.  Both kidneys demonstrate dilatation of the renal collecting systems and likely  of the ureters bilaterally, corresponding to findings on recent CT.  Both kidneys demonstrate a failure to fall to half maximum tracer activity following diuresis consistent with a degree of urinary outflow obstruction bilaterally.   Electronically Signed   By: Lavonia Dana M.D.   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  He has been referred for evaluation for percutaneous nephrostomy tube in order to salvage kidney function.  EXAM: IR NEPHROSTOMY PLACEMENT LEFT  MEDICATIONS: 3.0 mg Versed, 100 mcg fentanyl  35 minutes moderate sedation time  CONTRAST:  28mL OMNIPAQUE IOHEXOL 300 MG/ML  SOLN  FLUOROSCOPY TIME:  Fluoroscopy Time (in minutes and seconds): 2 minutes 54 seconds  PROCEDURE: The procedure, risks, benefits, and alternatives were explained to the patient. Questions regarding the procedure were encouraged and answered. The patient understands and consents to the procedure.  Patient is position in the its prone position on the fluoroscopy table, with ultrasound survey of the left flank performed and images stored sent to PACs.  Once the patient is prepped and draped in the usual sterile fashion, the skin and subcutaneous tissues of the left flank were generously infiltrated with 1% lidocaine for local anesthesia.  Ultrasound guidance was used to access the collecting system with a 20 gauge Chiba needle, with opacification the collecting system. Small amount of air was infused to identify a posterior calyx.  The posterior calyx was then accessed with a second stick technique with a [place] needle.  Once the Chiba needle was confirmed in the calyx, contrast confirmed position.  Micro wire was advanced into the collecting system, and then an Accustick triaxial system was advanced over the micro wire.  Subsequent exchange is performed yielding a 10 French pigtail catheter within the collecting system via a posterior interpolar calyx.  Patient tolerated procedure well and remained hemodynamically stable throughout.  No complications were  encountered and no significant blood loss was encounter.  COMPLICATIONS: None  FINDINGS: Left-sided hydronephrosis of the ultrasound survey.  Final images demonstrate safe placement of 10 French pigtail catheter into the collecting system via a posterior, interpolar calyx.  IMPRESSION: Status post left-sided percutaneous nephrostomy tube.  Signed,  Dulcy Fanny. Earleen Newport, DO  [facility]  [facility]   Electronically Signed   By: Corrie Mckusick D.O.   On: 03/06/2015 12:50    Cardiac Studies:  Assessment/Plan:  Status post acute anterolateral wall MI Status post acute upper GI bleed Multivessel CAD Hypertension Hypercholesteremia History of EtOH abuse History of esophageal stricture status post dilatation in the past Status post EGD antral gastritis Resolving acute renal injury High-grade bladder cancer status post radical cystoprostatectomy dominant with bilateral pelvic lymphadenectomy and ileal conduit urinary diversion History of right malignant hydronephrosis Resolving Escherichia coli bacteremia Resolving urosepsis Status post left obstructive uropathy status post left nephrostomy tube insertion Plan Discussed with patient regarding the medical management for now and then schedule him for PCI as outpatient once his renal function stabilizes and recovers from antral gastritis versus PCI now worse risk and benefits and agrees for waiting for a week or 2. Will DC IV Antibiotics Will DC home in24-48 hours if remains stable and okay with urology. Check labs in a.m.  LOS: 12 days    Charolette Forward 03/07/2015, 10:23 AM

## 2015-03-07 NOTE — Progress Notes (Signed)
Referring Physician(s): Urology  Subjective: Patient denies any CP, he does c/o pain at PCN site  Allergies: Review of patient's allergies indicates no known allergies.  Medications: Prior to Admission medications   Medication Sig Start Date End Date Taking? Authorizing Provider  Oxycodone HCl 10 MG TABS Take 10-20 mg by mouth 4 (four) times daily.   Yes Historical Provider, MD  traMADol (ULTRAM) 50 MG tablet Take 50-100 mg by mouth every 6 (six) hours as needed for moderate pain or severe pain.   Yes Historical Provider, MD  HYDROmorphone (DILAUDID) 4 MG tablet Take 1-2 tablets (4-8 mg total) by mouth every 4 (four) hours as needed for moderate pain or severe pain. Post-operatively Patient not taking: Reported on 02/23/2015 01/22/15   [name], MD  lisinopril (PRINIVIL,ZESTRIL) 5 MG tablet Take 5 mg by mouth daily.    Historical Provider, MD  omeprazole (PRILOSEC) 20 MG capsule Take 20 mg by mouth daily as needed (Heartburn).     Historical Provider, MD  senna-docusate (SENOKOT-S) 8.6-50 MG per tablet Take 1 tablet by mouth 2 (two) times daily. While taking pain meds to prevent constipation 01/22/15   [name], MD   Vital Signs: BP 87/62 mmHg  Pulse 71  Temp(Src) 97.3 F (36.3 C) (Oral)  Resp 18  Ht 5\' 10"  (1.778 m)  Wt 146 lb 13.2 oz (66.6 kg)  BMI 21.07 kg/m2  SpO2 100%  Physical Exam General:A&Ox3, NAD Abd: Soft, Left flank tender at PCN site, Left PCN dressing C/D/I, clear yellow urine in bag, 2300cc/24hrs  Imaging: Ct Abdomen Pelvis Wo Contrast  03/04/2015   CLINICAL DATA:  History of bladder and prostate cancer status post cystoprostatectomy, abnormal ultrasound with left hydronephrosis  EXAM: CT ABDOMEN AND PELVIS WITHOUT CONTRAST  TECHNIQUE: Multidetector CT imaging of the abdomen and pelvis was performed following the standard protocol without IV contrast.  COMPARISON:  Renal ultrasound dated 03/02/2015, CT abdomen dated 12/11/2014.  FINDINGS: Lower chest:   Mild nodular scarring at the right lung base.  Hepatobiliary: Unenhanced liver is unremarkable.  Gallbladder is underdistended. No intrahepatic or extrahepatic ductal dilatation.  Pancreas: Within normal limits.  Spleen: Within normal limits.  Adrenals/Urinary Tract: Adrenal glands are within normal limits.  Small right kidney.  No renal calculi or hydronephrosis.  1.5 cm cyst along the posterior interpolar left kidney (series 2/ image 32). Moderate left hydroureteronephrosis.  Status post cystoprostatectomy with right mid abdominal ileal conduit.  Left ureter is dilated to the level of the ureteral anastomosis (series 2/ image 55).  Stomach/Bowel: Stomach is notable for a small hiatal hernia.  No evidence of bowel obstruction.  Moderate colonic stool burden.  Vascular/Lymphatic: Atherosclerotic calcifications of the abdominal aorta and branch vessels. Mild ectasia of the infrarenal abdominal aorta, measuring up to 2.3 x 2.6 cm (series 2/ image 37).  No suspicious abdominopelvic lymphadenopathy.  Reproductive: Prostate is surgically absent, as above.  Other: No abdominopelvic ascites.  Fat within the left inguinal canal.  Musculoskeletal: Degenerative changes of the visualized thoracolumbar spine.  Very mild anterior wedging at T10.  IMPRESSION: Status post cystoprostatectomy.  No findings suspicious for metastatic disease.  Moderate left hydroureteronephrosis to the level of the ureteral anastomosis.   Electronically Signed   By: Julian Hy M.D.   On: 03/04/2015 16:02   Nm Renal Imag Wo/w Pharm  03/05/2015   CLINICAL DATA:  Hydronephrosis, bladder cancer post cystectomy  EXAM: NUCLEAR MEDICINE RENAL SCAN WITH DIURETIC ADMINISTRATION  TECHNIQUE: Radionuclide angiographic and sequential renal images  were obtained after intravenous injection of radiopharmaceutical. Imaging was continued during slow intravenous injection of Lasix approximately 15 minutes after the start of the examination.   RADIOPHARMACEUTICALS:  15 mCi Technetium-64m MAG3 IV  Pharmaceutical:  33 mg Lasix IV  COMPARISON:  CT abdomen and pelvis 03/04/2015  FINDINGS: Flow: Normal blood flow to LEFT kidney. Normal delivery of tracer is seen to the RIGHT kidney, which appears small and atrophic. BILATERAL renal flow curves show slightly better up slope of flow to LEFT kidney than RIGHT.  Left renogram: Normal uptake, concentration and excretion of tracer. Mild dilatation of LEFT renal collecting system with persistent visualization of the LEFT renal collecting system and LEFT ureter extending to urinary diversion in the RIGHT lower quadrant. No fall to half maximum activity is identified at the LEFT kidney. Delayed time to peak activity of 19.4 minutes.  Right renogram: Poor uptake, concentration is creation true of tracer by a small atrophic appearing RIGHT kidney. Mild excretion of tracer is seen into a mildly dilated RIGHT renal collecting system. Tracer is identified within a prominent RIGHT ureter. Renogram curve shows gradually accumulating tracer activity within the RIGHT kidney peaking at 22 minutes. Minimal fall in tracer following diuresis, failing to fall to half maximum activity.  Differential:  Left kidney = 90 %  Right kidney = 10 %  T1/2 post Lasix :  Left kidney = fails to fall to half maximum  Right kidney = fails to fall to half maximum  IMPRESSION: Markedly atrophic RIGHT kidney, small and contributing only 10% of differential renal function versus 90% by LEFT kidney.  Both kidneys demonstrate dilatation of the renal collecting systems and likely of the ureters bilaterally, corresponding to findings on recent CT.  Both kidneys demonstrate a failure to fall to half maximum tracer activity following diuresis consistent with a degree of urinary outflow obstruction bilaterally.   Electronically Signed   By: Lavonia Dana M.D.   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  He has been referred for evaluation for percutaneous nephrostomy tube in order to salvage kidney function.  EXAM: IR NEPHROSTOMY PLACEMENT LEFT  MEDICATIONS: 3.0 mg Versed, 100 mcg fentanyl  35 minutes moderate sedation time  CONTRAST:  24mL OMNIPAQUE IOHEXOL 300 MG/ML  SOLN  FLUOROSCOPY TIME:  Fluoroscopy Time (in minutes and seconds): 2 minutes 54 seconds  PROCEDURE: The procedure, risks, benefits, and alternatives were explained to the patient. Questions regarding the procedure were encouraged and answered. The patient understands and consents to the procedure.  Patient is position in the its prone position on the fluoroscopy table, with ultrasound survey of the left flank performed and images stored sent to PACs.  Once the patient is prepped and draped in the usual sterile fashion, the skin and subcutaneous tissues of the left flank were generously infiltrated with 1% lidocaine for local anesthesia.  Ultrasound guidance was used to access the collecting system with a 20 gauge Chiba needle, with opacification the collecting system. Small amount of air was infused to identify a posterior calyx.  The posterior calyx was then accessed with a second stick technique with a [place] needle.  Once the Chiba needle was confirmed in the calyx, contrast confirmed position.  Micro wire was advanced into the collecting system, and then an  Accustick triaxial system was advanced over the micro wire.  Subsequent exchange is performed yielding a 10 French pigtail catheter within the collecting system via a posterior interpolar calyx.  Patient tolerated procedure well and remained hemodynamically stable throughout.  No complications were encountered and no significant blood loss was encounter.  COMPLICATIONS: None  FINDINGS: Left-sided hydronephrosis  of the ultrasound survey.  Final images demonstrate safe placement of 10 French pigtail catheter into the collecting system via a posterior, interpolar calyx.  IMPRESSION: Status post left-sided percutaneous nephrostomy tube.  Signed,  Dulcy Fanny. Earleen Newport, DO  [facility]  [facility]   Electronically Signed   By: Corrie Mckusick D.O.   On: 03/06/2015 12:50    Labs:  CBC:  Recent Labs  03/04/15 0256 03/05/15 0339 03/06/15 0317 03/07/15 0235  WBC 10.1 9.4 8.4 9.0  HGB 10.8* 10.7* 10.4* 10.7*  HCT 32.6* 32.5* 32.2* 32.7*  PLT 296 284 264 256    COAGS:  Recent Labs  02/23/15 1519 03/06/15 0317  INR 1.19 1.07  APTT 22* 27    BMP:  Recent Labs  03/04/15 0256 03/05/15 0339 03/06/15 0317 03/07/15 0235  NA 134* 134* 133* 133*  K 4.2 4.9 5.1 5.0  CL 106 104 103 102  CO2 19* 22 19* 22  GLUCOSE 89 90 87 94  BUN 24* 29* 33* 30*  CALCIUM 7.7* 7.9* 8.0* 8.4*  CREATININE 1.96* 2.11* 2.34* 2.09*  GFRNONAA 35* 32* 28* 32*  GFRAA 40* 37* 33* 37*    LIVER FUNCTION TESTS:  Recent Labs  02/23/15 1519 02/23/15 1925 02/28/15 0302  BILITOT 0.6 0.5 0.9  AST 93* 117* 32  ALT 71* 71* 35  ALKPHOS 204* 185* 175*  PROT 7.5 6.7 6.7  ALBUMIN 2.3* 2.2* 1.9*    Assessment and Plan: Left hydronephrosis secondary to stricture at left distal ureter s/p left PCN 6/22 good output-clear yellow urine, H/H stable Metastatic bladder cancer s/p cystectomy and ilieal conduit diversion 01/2015 Acute on chronic renal insufficiency, Cr 2.09 (2.34) Plans per Urology S/p acute MI-Cardiology following   Signed: Tsosie Billing D 03/07/2015, 10:41 AM   I spent a total of 15 Minutes in face to face in clinical consultation/evaluation, greater than 50% of which was counseling/coordinating care for left hydronephrosis.

## 2015-03-08 LAB — CBC
HCT: 32.6 % — ABNORMAL LOW (ref 39.0–52.0)
Hemoglobin: 11 g/dL — ABNORMAL LOW (ref 13.0–17.0)
MCH: 30.3 pg (ref 26.0–34.0)
MCHC: 33.7 g/dL (ref 30.0–36.0)
MCV: 89.8 fL (ref 78.0–100.0)
PLATELETS: 227 10*3/uL (ref 150–400)
RBC: 3.63 MIL/uL — ABNORMAL LOW (ref 4.22–5.81)
RDW: 13.8 % (ref 11.5–15.5)
WBC: 6.9 10*3/uL (ref 4.0–10.5)

## 2015-03-08 LAB — BASIC METABOLIC PANEL
Anion gap: 12 (ref 5–15)
BUN: 31 mg/dL — ABNORMAL HIGH (ref 6–20)
CO2: 22 mmol/L (ref 22–32)
CREATININE: 2.05 mg/dL — AB (ref 0.61–1.24)
Calcium: 8.6 mg/dL — ABNORMAL LOW (ref 8.9–10.3)
Chloride: 95 mmol/L — ABNORMAL LOW (ref 101–111)
GFR calc Af Amer: 38 mL/min — ABNORMAL LOW (ref >60)
GFR calc non Af Amer: 33 mL/min — ABNORMAL LOW (ref >60)
GLUCOSE: 154 mg/dL — AB (ref 65–99)
Potassium: 4.8 mmol/L (ref 3.5–5.1)
Sodium: 129 mmol/L — ABNORMAL LOW (ref 135–145)

## 2015-03-08 NOTE — Progress Notes (Signed)
Subjective:  Doing well.  Denies any chest pain or shortness of breath.nephrostomy tube draining clear urine  Objective:  Vital Signs in the last 24 hours: Temp:  [98.2 F (36.8 C)-99.7 F (37.6 C)] 98.4 F (36.9 C) (06/24 0805) Pulse Rate:  [64-79] 79 (06/24 0805) Resp:  [18] 18 (06/23 1116) BP: (83-105)/(56-70) 94/65 mmHg (06/24 0805) SpO2:  [94 %-100 %] 94 % (06/24 0805)  Intake/Output from previous day: 06/23 0701 - 06/24 0700 In: 81 [P.O.:480; I.V.:10; IV Piggyback:100] Out: 3200 [Urine:3200] Intake/Output from this shift:    Physical Exam: Neck: no adenopathy, no carotid bruit, no JVD and supple, symmetrical, trachea midline Lungs: clear to auscultation bilaterally Heart: regular rate and rhythm, S1, S2 normal, no murmur, click, rub or gallop Abdomen: soft, non-tender; bowel sounds normal; no masses,  no organomegaly Extremities: extremities normal, atraumatic, no cyanosis or edema  Lab Results:  Recent Labs  03/07/15 0235 03/08/15 0306  WBC 9.0 6.9  HGB 10.7* 11.0*  PLT 256 227    Recent Labs  03/07/15 0235 03/08/15 0306  NA 133* 129*  K 5.0 4.8  CL 102 95*  CO2 22 22  GLUCOSE 94 154*  BUN 30* 31*  CREATININE 2.09* 2.05*   No results for input(s): TROPONINI in the last 72 hours.  Invalid input(s): CK, MB Hepatic Function Panel No results for input(s): PROT, ALBUMIN, AST, ALT, ALKPHOS, BILITOT, BILIDIR, IBILI in the last 72 hours. No results for input(s): CHOL in the last 72 hours. No results for input(s): PROTIME in the last 72 hours.  Imaging: Imaging results have been reviewed and Ir Nephrostomy Placement Left  03/06/2015   CLINICAL DATA:  64 year old male with a history of urothelial cell carcinoma, status post urinary bladder resection and urostomy, now has a stricture at the left distal ureter, developing hydronephrosis.  He has been referred for evaluation for percutaneous nephrostomy tube in order to salvage kidney function.  EXAM: IR  NEPHROSTOMY PLACEMENT LEFT  MEDICATIONS: 3.0 mg Versed, 100 mcg fentanyl  35 minutes moderate sedation time  CONTRAST:  35mL OMNIPAQUE IOHEXOL 300 MG/ML  SOLN  FLUOROSCOPY TIME:  Fluoroscopy Time (in minutes and seconds): 2 minutes 54 seconds  PROCEDURE: The procedure, risks, benefits, and alternatives were explained to the patient. Questions regarding the procedure were encouraged and answered. The patient understands and consents to the procedure.  Patient is position in the its prone position on the fluoroscopy table, with ultrasound survey of the left flank performed and images stored sent to PACs.  Once the patient is prepped and draped in the usual sterile fashion, the skin and subcutaneous tissues of the left flank were generously infiltrated with 1% lidocaine for local anesthesia.  Ultrasound guidance was used to access the collecting system with a 20 gauge Chiba needle, with opacification the collecting system. Small amount of air was infused to identify a posterior calyx.  The posterior calyx was then accessed with a second stick technique with a [place] needle.  Once the Chiba needle was confirmed in the calyx, contrast confirmed position.  Micro wire was advanced into the collecting system, and then an Accustick triaxial system was advanced over the micro wire.  Subsequent exchange is performed yielding a 10 French pigtail catheter within the collecting system via a posterior interpolar calyx.  Patient tolerated procedure well and remained hemodynamically stable throughout.  No complications were encountered and no significant blood loss was encounter.  COMPLICATIONS: None  FINDINGS: Left-sided hydronephrosis of the ultrasound survey.  Final images demonstrate safe placement of 10 French pigtail catheter into the collecting system via a posterior, interpolar calyx.  IMPRESSION: Status post left-sided percutaneous nephrostomy tube.  Signed,  Dulcy Fanny. Earleen Newport, DO  [facility]  [facility]   Electronically Signed   By: Corrie Mckusick D.O.   On: 03/06/2015 12:50    Cardiac Studies:  Assessment/Plan:  Status post acute anterolateral wall MI Status post acute upper GI bleed Multivessel CAD Hypertension Hypercholesteremia History of EtOH abuse History of esophageal stricture status post dilatation in the past Status post EGD antral gastritis Resolving acute renal injury High-grade bladder cancer status post radical cystoprostatectomy dominant with bilateral pelvic lymphadenectomy and ileal conduit urinary diversion History of right malignant hydronephrosis Resolving Escherichia coli bacteremia Resolving urosepsis Status post left obstructive uropathy status post left nephrostomy tube insertion Plan Continue present management. Possible discharge tomorrow if okay with urology  LOS: 13 days    Charolette Forward 03/08/2015, 9:03 AM

## 2015-03-08 NOTE — Progress Notes (Signed)
Subjective:  1 - Metastatic Bladder Cancer - s/p cystectomy with bilateral pelvic lymphadenectomy and ileal conduit urinary diversion 01/2015 for pT3N1Mx high-grade urothelial carcinoma. He had postive sentinal right peri-ureteral lymph node as well as margin-positive disease.  2 - Acute on Chronic Renal Insufficiency / Mild-Mod Left Hydronephrosis - s/p left nephrostomy tube 6/22 for new left hydro of dominant kidney and rise in Cr to 2's from baseline Cr 1.2's. Renogram 6/21 confirmed likely  obstructive. Cr remains stable.   3 - E. Coli Bacteremia- 1 of 2 BCX positive for E Coli 02/23/15 this admission. Now on CX-specific Kefzol. F/U [facility] 6/17 negative.  Today "Ulice Dash" is without complaints. He will likely be DC'd tomorrow with medical management of his coronary disease, possible PCI in elective setting.   Objective: Vital signs in last 24 hours: Temp:  [98 F (36.7 C)-98.8 F (37.1 C)] 98 F (36.7 C) (06/24 1900) Pulse Rate:  [66-79] 77 (06/24 1900) Resp:  [16-20] 18 (06/24 1900) BP: (73-95)/(50-66) 94/64 mmHg (06/24 1900) SpO2:  [94 %-98 %] 98 % (06/24 1900) Last BM Date: 03/03/15  Intake/Output from previous day: 06/23 0701 - 06/24 0700 In: 33 [P.O.:480; I.V.:10; IV Piggyback:100] Out: 3200 [Urine:3200] Intake/Output this shift:    General appearance: alert, cooperative, appears stated age and family at bedside Head: Normocephalic, without obvious abnormality, atraumatic Nose: Nares normal. Septum midline. Mucosa normal. No drainage or sinus tenderness. Throat: lips, mucosa, and tongue normal; teeth and gums normal Back: symmetric, no curvature. ROM normal. No CVA tenderness. Resp: non-labored on room air Cardio: Nl rate GI: soft, non-tender; bowel sounds normal; no masses,  no organomegaly Male genitalia: normal Extremities: extremities normal, atraumatic, no cyanosis or edema Pulses: 2+ and symmetric Skin: Skin color, texture, turgor normal. No rashes or lesions Lymph  nodes: Cervical, supraclavicular, and axillary nodes normal. Neurologic: Grossly normal RLQ urostomy pink / patent. Left neph tube with copious clear urine.   Lab Results:   Recent Labs  03/07/15 0235 03/08/15 0306  WBC 9.0 6.9  HGB 10.7* 11.0*  HCT 32.7* 32.6*  PLT 256 227   BMET  Recent Labs  03/07/15 0235 03/08/15 0306  NA 133* 129*  K 5.0 4.8  CL 102 95*  CO2 22 22  GLUCOSE 94 154*  BUN 30* 31*  CREATININE 2.09* 2.05*  CALCIUM 8.4* 8.6*   PT/INR  Recent Labs  03/06/15 0317  LABPROT 14.1  INR 1.07   ABG No results for input(s): PHART, HCO3 in the last 72 hours.  Invalid input(s): PCO2, PO2  Studies/Results: No results found.  Anti-infectives: Anti-infectives    Start     Dose/Rate Route Frequency Ordered Stop   03/07/15 1800  ceFAZolin (ANCEF) IVPB 2 g/50 mL premix     2 g 100 mL/hr over 30 Minutes Intravenous Every 12 hours 03/07/15 1131 03/10/15 0559   03/06/15 1759  ceFAZolin (ANCEF) IVPB 2 g/50 mL premix  Status:  Discontinued     2 g 100 mL/hr over 30 Minutes Intravenous Every 12 hours 03/06/15 1132 03/07/15 1031   03/04/15 1400  ceFAZolin (ANCEF) IVPB 1 g/50 mL premix  Status:  Discontinued     1 g 100 mL/hr over 30 Minutes Intravenous 3 times per day 03/04/15 0911 03/04/15 0917   03/04/15 1400  ceFAZolin (ANCEF) IVPB 2 g/50 mL premix  Status:  Discontinued     2 g 100 mL/hr over 30 Minutes Intravenous 3 times per day 03/04/15 0919 03/06/15 1132   02/28/15 1700  ceFAZolin (  ANCEF) IVPB 2 g/50 mL premix  Status:  Discontinued     2 g 100 mL/hr over 30 Minutes Intravenous 3 times per day 02/28/15 1035 03/04/15 0911   02/27/15 1800  ceFAZolin (ANCEF) IVPB 2 g/50 mL premix  Status:  Discontinued     2 g 100 mL/hr over 30 Minutes Intravenous Every 12 hours 02/27/15 1136 02/28/15 1035   02/23/15 2300  vancomycin (VANCOCIN) IVPB 1000 mg/200 mL premix  Status:  Discontinued     1,000 mg 200 mL/hr over 60 Minutes Intravenous Every 24 hours 02/23/15  2225 02/27/15 1136   02/23/15 2230  piperacillin-tazobactam (ZOSYN) IVPB 3.375 g  Status:  Discontinued     3.375 g 12.5 mL/hr over 240 Minutes Intravenous 3 times per day 02/23/15 2225 02/27/15 1136      Assessment/Plan:  1 - Metastatic Bladder Cancer - long term prognosis guarded, >2 year life expectancy would be unusual. Ideally he would receive adjuvant chemo but GFR would need to be better. He had malignant hydro pre-cystectomy as well.   2 - Acute on Chronic Renal Insufficiency / Mild-Mod Left Hydronephrosis - now s/p left neph tube to maximize function of dominant kidney. I am cationsly optimistic his GFR will improve. This will certainly make him better candidate for PCI and future chemo.   3 - E. Coli Bacteremia- probable GU source. Agree with current ABX and fortunately f/u CX's negative. I feel no indication for further ABX upon discharge.  Pt has GU f/u arranged for early August.   [facility], [place] 03/08/2015

## 2015-03-09 MED ORDER — CARVEDILOL 3.125 MG PO TABS: 3.1250 mg | ORAL_TABLET | Freq: Two times a day (BID) | ORAL | Status: AC

## 2015-03-09 MED ORDER — CLOPIDOGREL BISULFATE 75 MG PO TABS
75.0000 mg | ORAL_TABLET | Freq: Every day | ORAL | Status: DC
Start: 2015-03-09 — End: 2016-08-21

## 2015-03-09 MED ORDER — ATORVASTATIN CALCIUM 40 MG PO TABS: 40.0000 mg | ORAL_TABLET | Freq: Every day | ORAL | Status: AC

## 2015-03-09 MED ORDER — PANTOPRAZOLE SODIUM 40 MG PO TBEC: 40.0000 mg | DELAYED_RELEASE_TABLET | Freq: Two times a day (BID) | ORAL | Status: AC

## 2015-03-09 MED ORDER — NITROGLYCERIN 0.4 MG SL SUBL: 0.4000 mg | SUBLINGUAL_TABLET | SUBLINGUAL | Status: AC | PRN

## 2015-03-09 MED ORDER — ASPIRIN 81 MG PO TBEC: 81.0000 mg | DELAYED_RELEASE_TABLET | Freq: Every day | ORAL | Status: DC

## 2015-03-09 NOTE — Progress Notes (Signed)
[id number] In to see patient to do discharge education. Patient in process of going home. MI booklet reviewed with emphlysis on smoking and alcohol cessation, nitro use, and risk factor reduction. Patient wife stated she was aware of education as she is a recovering MI patient as of Feb 2016. She declined phase 2 rehab because of driving distance and patient states he will decline as well. Giving patient has multiple blockages and needs PCI, home exercise was not reviewed. Patient states he may be back for his PCI/Stent x 4 next week. Will follow up with patient at that time for exercise education and phase 2.

## 2015-03-09 NOTE — Discharge Instructions (Signed)
Acute Coronary Syndrome °Acute coronary syndrome (ACS) is an urgent problem in which the blood and oxygen supply to the heart is critically deficient. ACS requires hospitalization because one or more coronary arteries may be blocked. °ACS represents a range of conditions including: °· Previous angina that is now unstable, lasts longer, happens at rest, or is more intense. °· A heart attack, with heart muscle cell injury and death. °There are three vital coronary arteries that supply the heart muscle with blood and oxygen so that it can pump blood effectively. If blockages to these arteries develop, blood flow to the heart muscle is reduced. If the heart does not get enough blood, angina may occur as the first warning sign. °SYMPTOMS  °· The most common signs of angina include: °¨ Tightness or squeezing in the chest. °¨ Feeling of heaviness on the chest. °¨ Discomfort in the arms, neck, back, or jaw. °¨ Shortness of breath and nausea. °¨ Cold, wet skin. °· Angina is usually brought on by physical effort or excitement which increase the oxygen needs of the heart. These states increase the blood flow needs of the heart beyond what can be delivered. °· Other symptoms that are not as common include: °¨ Fatigue °¨ Unexplained feelings of nervousness or anxiety °¨ Weakness °¨ Diarrhea °· Sometimes, you may not have noticed any symptoms at all but still suffered a cardiac injury. °TREATMENT  °· Medicines to help discomfort may include nitroglycerin (nitro) in the form of tablets or a spray for rapid relief, or longer-acting forms such as cream, patches, or capsules. (Be aware that there are many side effects and possible interactions with other drugs). °· Other medicines may be used to help the heart pump better. °· Procedures to open blocked arteries including angioplasty or stent placement to keep the arteries open. °· Open heart surgery may be needed when there are many blockages or they are in critical locations that  are best treated with surgery. °HOME CARE INSTRUCTIONS  °· Do not use any tobacco products including cigarettes, chewing tobacco, or electronic cigarettes. °· Take one baby or adult aspirin daily, if your health care provider advises. This helps reduce the risk of a heart attack. °· It is very important that you follow the angina treatment prescribed by your health care provider. Make arrangements for proper follow-up care. °· Eat a heart healthy diet with salt and fat restrictions as advised. °· Regular exercise is good for you as long as it does not cause discomfort. Do not begin any new type of exercise until you check with your health care provider. °· If you are overweight, you should lose weight. °· Try to maintain normal blood lipid levels. °· Keep your blood pressure under control as recommended by your health care provider. °· You should tell your health care provider right away about any increase in the severity or frequency of your chest discomfort or angina attacks. When you have angina, you should stop what you are doing and sit down. This may bring relief in 3 to 5 minutes. If your health care provider has prescribed nitro, take it as directed. °· If your health care provider has given you a follow-up appointment, it is very important to keep that appointment. Not keeping the appointment could result in a chronic or permanent injury, pain, and disability. If there is any problem keeping the appointment, you must call back to this facility for assistance. °SEEK IMMEDIATE MEDICAL CARE IF:  °· You develop nausea, vomiting, or shortness   of breath. °· You feel faint, lightheaded, or pass out. °· Your chest discomfort gets worse. °· You are sweating or experience sudden profound fatigue. °· You do not get relief of your chest pain after 3 doses of nitro. °· Your discomfort lasts longer than 15 minutes. °MAKE SURE YOU:  °· Understand these instructions. °· Will watch your condition. °· Will get help right  away if you are not doing well or get worse. °· Take all medicines as directed by your health care provider. °Document Released: 08/31/2005 Document Revised: 09/05/2013 Document Reviewed: 01/02/2014 °[employer]® Patient Information ©2015 [employer]. This information is not intended to replace advice given to you by your health care provider. Make sure you discuss any questions you have with your health care provider. °Coronary Angiogram °A coronary angiogram, also called coronary angiography, is an X-ray procedure used to look at the arteries in the heart. In this procedure, a dye (contrast dye) is injected through a long, hollow tube (catheter). The catheter is about the size of a piece of cooked spaghetti and is inserted through your groin, wrist, or arm. The dye is injected into each artery, and X-rays are then taken to show if there is a blockage in the arteries of your heart. °LET YOUR HEALTH CARE PROVIDER KNOW ABOUT: °· Any allergies you have, including allergies to shellfish or contrast dye.   °· All medicines you are taking, including vitamins, herbs, eye drops, creams, and over-the-counter medicines.   °· Previous problems you or members of your family have had with the use of anesthetics.   °· Any blood disorders you have.   °· Previous surgeries you have had. °· History of kidney problems or failure.   °· Other medical conditions you have. °RISKS AND COMPLICATIONS  °Generally, a coronary angiogram is a safe procedure. However, problems can occur and include: °· Allergic reaction to the dye. °· Bleeding from the access site or other locations. °· Kidney injury, especially in people with impaired kidney function.  °· Stroke (rare). °· Heart attack (rare). °BEFORE THE PROCEDURE  °· Do not eat or drink anything after midnight the night before the procedure or as directed by your health care provider.   °· Ask your health care provider about changing or stopping your regular medicines. This is especially  important if you are taking diabetes medicines or blood thinners. °PROCEDURE °· You may be given a medicine to help you relax (sedative) before the procedure. This medicine is given through an intravenous (IV) access tube that is inserted into one of your veins.   °· The area where the catheter will be inserted will be washed and shaved. This is usually done in the groin but may be done in the fold of your arm (near your elbow) or in the wrist.    °· A medicine will be given to numb the area where the catheter will be inserted (local anesthetic).   °· The health care provider will insert the catheter into an artery. The catheter will be guided by using a special type of X-ray (fluoroscopy) of the blood vessel being examined.   °· A special dye will then be injected into the catheter, and X-rays will be taken. The dye will help to show where any narrowing or blockages are located in the heart arteries.   °AFTER THE PROCEDURE  °· If the procedure is done through the leg, you will be kept in bed lying flat for several hours. You will be instructed to not bend or cross your legs. °· The insertion site will be checked   frequently.   °· The pulse in your feet or wrist will be checked frequently.   °· Additional blood tests, X-rays, and an electrocardiogram may be done.   °Document Released: 03/07/2003 Document Revised: 01/15/2014 Document Reviewed: 01/23/2013 °Exit[employer]® Patient Information ©2015 [employer]. This information is not intended to replace advice given to you by your health care provider. Make sure you discuss any questions you have with your health care provider. ° °

## 2015-03-09 NOTE — Discharge Summary (Signed)
Darren Carr, Darren Carr                 ACCOUNT NO.:  [id number]  MEDICAL RECORD NO.:  [id number]  LOCATION:  2H19C                        FACILITY:  [facility]  PHYSICIAN:  Leonardo Makris N. Terrence Dupont, M.D. DATE OF BIRTH:  1951-08-01  DATE OF ADMISSION:  02/23/2015 DATE OF DISCHARGE:  03/09/2015                              DISCHARGE SUMMARY   ADMITTING DIAGNOSES: 1. Possible acute lateral wall myocardial infarction. 2. Anteroseptal wall myocardial infarction, age undetermined. 3. Acute upper gastrointestinal bleed. 4. Hypertension. 5. Hypercholesteremia. 6. History of cancer of bladder status post resection and diverging     urostomy. 7. Alcohol abuse. 8. Dehydration. 9. Acute renal injury. 10.History of esophageal stricture status post balloon dilatation in     the past.  DISCHARGE DIAGNOSES: 1. Status post acute anterolateral wall myocardial infarction. 2. Multivessel coronary artery disease, status post left cardiac     catheterization. 3. Status post acute upper gastrointestinal bleed. 4. Hypertension. 5. Hypercholesteremia. 6. History of alcohol abuse. 7. Status post urinary tract infection/E. coli bacteremia. 8. History of esophageal stricture status post dilatation in the past,     status post esophagogastroduodenoscopy, noted to have     esophagitis/gastritis.  No acute bleeding noted. 9. Acute renal injury. 10.High-grade bladder cancer status post radical cystoprostatectomy     with bilateral pelvic lymphadenectomy and ileal conduit urinary     diversion. 11.History of right malignant hydronephrosis. 12.Acute left obstructive uropathy with hydronephrosis requiring left     nephrostomy tube insertion.  DISCHARGE HOME MEDICATIONS: 1. Aspirin 81 mg 1 tablet daily. 2. Atorvastatin 40 mg daily. 3. Carvedilol 3.125 mg twice daily. 4. Clopidogrel 75 mg daily. 5. Nitrostat sublingual p.r.n. 6. Pantoprazole 40 mg twice daily. 7. Dilaudid 1-2 tablets every 4 hours as needed for  pain. 8. Dulcolax 1 tablet twice daily for constipation as needed. 9. Patient has been advised to stop lisinopril, omeprazole, oxycodone,     and tramadol for now.  CONDITION AT DISCHARGE:  Stable.  FOLLOWUP:  Follow up with me in 1 week.  Follow up with Urology as scheduled.  We will check his labs, CBC, basic metabolic panel in 1 week.  CONDITION AT DISCHARGE:  Stable.  The patient has been advised to call 911, if he gets recurrent chest pain.  The patient will be started on clopidogrel.  The patient also has been advised if he notices any black tarry stool or coffee-ground vomitus, should call 911 immediately.  Consultation during the hospital stay was CVTS consult with Dr. Servando Snare; GI consultation with Dr. Michail Sermon; Urology consultation with Dr. [name].  BRIEF HISTORY AND HOSPITAL COURSE:  Darren Carr is 64 year old male with past medical history significant for CA of the bladder status post resection with diverging urostomy, stage III; hypertension; hypercholesteremia; history of esophageal stricture; chronic alcohol abuse; history of tobacco abuse in the past.  He came to the ER by EMS as patient initially was complaining of abdominal pain associated with coffee-ground vomitus.  Patient was noted to be tremulous, ill appearance, on the way started having retrosternal chest pain.  EKG done on the field showed normal sinus rhythm with possible old anteroseptal wall myocardial infarction and ST elevation in lateral  leads.  The patient was also noted to have elevated troponin I of 1.16.  The patient was also noted to have markedly elevated creatinine of 2.5 and BUN above 100.  Code STEMI was called because of new EKG changes.  PHYSICAL EXAMINATION:  VITAL SIGNS:  On examination, his blood pressure was 148/94, pulse 82.  He was afebrile. HEENT:  Conjunctivae were pink. NECK:  Supple.  No JVD. LUNGS:  Clear to auscultation. CARDIOVASCULAR:  S1, S2 was normal.  There  was soft systolic murmur.  No S3, gallop. ABDOMEN:  Soft.  Bowel sounds were present. EXTREMITIES:  There was no clubbing, cyanosis, or edema. NEUROLOGIC:  Patient was awake, but drowsy responds to verbal command by saying yes or no, ill appearing.  LABORATORY DATA:  Labs, his sodium was 138, potassium was 6.1, BUN 107, creatinine 2.80.  Glucose was 154.  Hemoglobin was 12.3, hematocrit 37.1, white count of 19.8.  His troponin I was 1.16, repeat troponin I is 9.19, 16.25, 20.02, 25.53; on February 25, 2015, 15.50, which is trending down; on March 01, 2015, 3.03.  His last electrolytes, sodium 129, potassium 4.8, BUN 31, creatinine 2.05.  Hemoglobin is 11, hematocrit 32.6, white count of 6.9.  Blood cultures were positive for E. coli. Repeat blood cultures have been negative.  CT of the abdomen done on March 04, 2015, showed moderate left hydronephrosis to the level of ureteral anastomosis.  Renal ultrasound done on March 02, 2015, showed mild-to-moderate left-sided hydronephrosis, which was new from prior study, mild right renal atrophy with increased parenchymal echogenicity likely reflecting underlying medical renal disease, previously noted right-sided hydronephrosis was resolved.  BRIEF HOSPITAL COURSE:  Discussed with patient, patient's wife at length regarding various options of treatment, i.e. invasive left cath, possible PTCA stenting versus medical management as patient had acute renal failure and also has significant coffee-ground vomitus suggestive of acute upper gastrointestinal bleed.  Patient's family decided to proceed with invasive therapy.  The patient subsequently underwent emergent left cath as per procedure report and was noted to have 3- vessel coronary artery disease, CVTS consultation was obtained emergently, patient was felt not to be the candidate for surgery in view of multiple comorbidities and acute renal failure and acute GI bleed. GI consultation was also  obtained with Dr. Michail Sermon.  Patient subsequently underwent upper endoscopy which showed esophagitis and mild gastritis.  The patient was placed on PPI with no further significant evidence of bleeding.  Renal Service consultation was obtained, also patient had significantly elevated creatinine, despite hydration, subsequently had renal ultrasound, which showed new left hydronephrosis requiring left nephrostomy tube placement by Interventional Radiology. The patient initially was started also on broad-spectrum antibiotics. Pancultures were obtained for marked leukocytosis, subsequently was noted to have E. coli bacteremia and antibiotics were switched to IV Ancef, which he has been tolerating well.  The patient has been afebrile for last few days.  His renal function, creatinine has remained above 2, despite nephrostomy tube placement with good diuresis.  Discussed with patient and his wife at length various options of treatment, i.e., medical management for now.  Follow his renal function and hemoglobin as outpatient in a week or so and if hemoglobin remains stable and renal function improves, consider elective PCI as outpatient to which patient agrees.  Patient did not have any further episodes of chest pain during the hospital stay.  The patient will be discharged home on above medications and will be followed up in my office in 1 week.  The patient will be followed up by Urology as scheduled as outpatient.     Allegra Lai. Terrence Dupont, M.D.     [facility]/MEDQ  D:  03/09/2015  T:  03/09/2015  Job:  [id number]

## 2015-03-09 NOTE — Discharge Summary (Signed)
Discharge summary dictated on 03/09/2015 dictation number is [phone]

## 2015-04-23 ENCOUNTER — Ambulatory Visit (HOSPITAL_COMMUNITY)
Admission: RE | Admit: 2015-04-23 | Discharge: 2015-04-23 | Disposition: A | Discharge status: Home / Self Care | Payer: BLUE CROSS/BLUE SHIELD | Source: Ambulatory Visit | Attending: Urology | Admitting: Urology

## 2015-04-23 ENCOUNTER — Other Ambulatory Visit: Payer: Self-pay | Admitting: Urology

## 2015-04-23 DIAGNOSIS — C679 Malignant neoplasm of bladder, unspecified: Secondary | ICD-10-CM

## 2015-04-23 DIAGNOSIS — M47894 Other spondylosis, thoracic region: Secondary | ICD-10-CM | POA: Insufficient documentation

## 2015-04-23 DIAGNOSIS — C772 Secondary and unspecified malignant neoplasm of intra-abdominal lymph nodes: Principal | ICD-10-CM

## 2015-04-24 ENCOUNTER — Inpatient Hospital Stay (HOSPITAL_COMMUNITY)
Admission: AD | Admit: 2015-04-24 | Discharge: 2015-04-25 | DRG: 287 | Disposition: A | Discharge status: Home / Self Care | Payer: BLUE CROSS/BLUE SHIELD | Source: Ambulatory Visit | Attending: Cardiology | Admitting: Cardiology

## 2015-04-24 ENCOUNTER — Encounter (HOSPITAL_COMMUNITY): Payer: Self-pay | Admitting: General Practice

## 2015-04-24 DIAGNOSIS — M549 Dorsalgia, unspecified: Secondary | ICD-10-CM | POA: Diagnosis present

## 2015-04-24 DIAGNOSIS — F1010 Alcohol abuse, uncomplicated: Secondary | ICD-10-CM | POA: Diagnosis present

## 2015-04-24 DIAGNOSIS — Z79899 Other long term (current) drug therapy: Secondary | ICD-10-CM

## 2015-04-24 DIAGNOSIS — Z906 Acquired absence of other parts of urinary tract: Secondary | ICD-10-CM | POA: Diagnosis present

## 2015-04-24 DIAGNOSIS — G8929 Other chronic pain: Secondary | ICD-10-CM | POA: Diagnosis present

## 2015-04-24 DIAGNOSIS — Z9079 Acquired absence of other genital organ(s): Secondary | ICD-10-CM | POA: Diagnosis present

## 2015-04-24 DIAGNOSIS — I25119 Atherosclerotic heart disease of native coronary artery with unspecified angina pectoris: Principal | ICD-10-CM | POA: Diagnosis present

## 2015-04-24 DIAGNOSIS — Z936 Other artificial openings of urinary tract status: Secondary | ICD-10-CM

## 2015-04-24 DIAGNOSIS — Z7902 Long term (current) use of antithrombotics/antiplatelets: Secondary | ICD-10-CM

## 2015-04-24 DIAGNOSIS — I252 Old myocardial infarction: Secondary | ICD-10-CM

## 2015-04-24 DIAGNOSIS — Z7982 Long term (current) use of aspirin: Secondary | ICD-10-CM

## 2015-04-24 DIAGNOSIS — N184 Chronic kidney disease, stage 4 (severe): Secondary | ICD-10-CM | POA: Diagnosis present

## 2015-04-24 DIAGNOSIS — I255 Ischemic cardiomyopathy: Secondary | ICD-10-CM | POA: Diagnosis present

## 2015-04-24 DIAGNOSIS — I2109 ST elevation (STEMI) myocardial infarction involving other coronary artery of anterior wall: Secondary | ICD-10-CM | POA: Diagnosis present

## 2015-04-24 DIAGNOSIS — K219 Gastro-esophageal reflux disease without esophagitis: Secondary | ICD-10-CM | POA: Diagnosis present

## 2015-04-24 DIAGNOSIS — I129 Hypertensive chronic kidney disease with stage 1 through stage 4 chronic kidney disease, or unspecified chronic kidney disease: Secondary | ICD-10-CM | POA: Diagnosis present

## 2015-04-24 DIAGNOSIS — F17210 Nicotine dependence, cigarettes, uncomplicated: Secondary | ICD-10-CM | POA: Diagnosis present

## 2015-04-24 DIAGNOSIS — Z8551 Personal history of malignant neoplasm of bladder: Secondary | ICD-10-CM | POA: Diagnosis not present

## 2015-04-24 HISTORY — DX: Gastro-esophageal reflux disease without esophagitis: K21.9

## 2015-04-24 HISTORY — DX: Personal history of other medical treatment: Z92.89

## 2015-04-24 HISTORY — DX: ST elevation (STEMI) myocardial infarction involving other coronary artery of anterior wall: I21.09

## 2015-04-24 HISTORY — DX: Atherosclerotic heart disease of native coronary artery without angina pectoris: I25.10

## 2015-04-24 HISTORY — DX: Malignant neoplasm of bladder, unspecified: C67.9

## 2015-04-24 LAB — COMPREHENSIVE METABOLIC PANEL
ALBUMIN: 2.5 g/dL — AB (ref 3.5–5.0)
ALT: 8 U/L — ABNORMAL LOW (ref 17–63)
ANION GAP: 7 (ref 5–15)
AST: 15 U/L (ref 15–41)
Alkaline Phosphatase: 85 U/L (ref 38–126)
BUN: 15 mg/dL (ref 6–20)
CALCIUM: 8.1 mg/dL — AB (ref 8.9–10.3)
CO2: 23 mmol/L (ref 22–32)
CREATININE: 2 mg/dL — AB (ref 0.61–1.24)
Chloride: 105 mmol/L (ref 101–111)
GFR calc Af Amer: 39 mL/min — ABNORMAL LOW (ref >60)
GFR calc non Af Amer: 34 mL/min — ABNORMAL LOW (ref >60)
Glucose, Bld: 89 mg/dL (ref 65–99)
POTASSIUM: 4.3 mmol/L (ref 3.5–5.1)
Sodium: 135 mmol/L (ref 135–145)
TOTAL PROTEIN: 7 g/dL (ref 6.5–8.1)
Total Bilirubin: 0.4 mg/dL (ref 0.3–1.2)

## 2015-04-24 LAB — CBC WITH DIFFERENTIAL/PLATELET
Basophils Absolute: 0.1 10*3/uL (ref 0.0–0.1)
Basophils Relative: 1 % (ref 0–1)
EOS ABS: 0.4 10*3/uL (ref 0.0–0.7)
EOS PCT: 4 % (ref 0–5)
HCT: 33.8 % — ABNORMAL LOW (ref 39.0–52.0)
Hemoglobin: 11.1 g/dL — ABNORMAL LOW (ref 13.0–17.0)
Lymphocytes Relative: 25 % (ref 12–46)
Lymphs Abs: 2.2 10*3/uL (ref 0.7–4.0)
MCH: 29.3 pg (ref 26.0–34.0)
MCHC: 32.8 g/dL (ref 30.0–36.0)
MCV: 89.2 fL (ref 78.0–100.0)
Monocytes Absolute: 1.1 10*3/uL — ABNORMAL HIGH (ref 0.1–1.0)
Monocytes Relative: 13 % — ABNORMAL HIGH (ref 3–12)
NEUTROS ABS: 5 10*3/uL (ref 1.7–7.7)
Neutrophils Relative %: 57 % (ref 43–77)
Platelets: 253 10*3/uL (ref 150–400)
RBC: 3.79 MIL/uL — ABNORMAL LOW (ref 4.22–5.81)
RDW: 15.7 % — ABNORMAL HIGH (ref 11.5–15.5)
WBC: 8.8 10*3/uL (ref 4.0–10.5)

## 2015-04-24 LAB — TROPONIN I: TROPONIN I: 0.03 ng/mL (ref <0.031)

## 2015-04-24 MED ORDER — NITROGLYCERIN 2 % TD OINT
0.5000 [in_us] | TOPICAL_OINTMENT | Freq: Three times a day (TID) | TRANSDERMAL | Status: DC
  Administered 2015-04-24 – 2015-04-25 (×2): 0.5 [in_us] via TOPICAL
  Filled 2015-04-24: qty 30

## 2015-04-24 MED ORDER — SENNOSIDES-DOCUSATE SODIUM 8.6-50 MG PO TABS
1.0000 | ORAL_TABLET | Freq: Two times a day (BID) | ORAL | Status: DC
  Administered 2015-04-24: 1 via ORAL
  Filled 2015-04-24: qty 1

## 2015-04-24 MED ORDER — SODIUM BICARBONATE BOLUS VIA INFUSION
INTRAVENOUS | Status: AC
  Administered 2015-04-25: 07:00:00 via INTRAVENOUS
  Filled 2015-04-24: qty 1

## 2015-04-24 MED ORDER — ATORVASTATIN CALCIUM 40 MG PO TABS
40.0000 mg | ORAL_TABLET | Freq: Every day | ORAL | Status: DC
  Administered 2015-04-24 – 2015-04-25 (×2): 40 mg via ORAL
  Filled 2015-04-24 (×2): qty 1

## 2015-04-24 MED ORDER — NITROGLYCERIN 0.4 MG SL SUBL: 0.4000 mg | SUBLINGUAL_TABLET | SUBLINGUAL | Status: DC | PRN

## 2015-04-24 MED ORDER — CLOPIDOGREL BISULFATE 75 MG PO TABS
75.0000 mg | ORAL_TABLET | Freq: Every day | ORAL | Status: DC
  Administered 2015-04-24: 75 mg via ORAL
  Filled 2015-04-24: qty 1

## 2015-04-24 MED ORDER — ACETAMINOPHEN 325 MG PO TABS: 650.0000 mg | ORAL_TABLET | ORAL | Status: DC | PRN

## 2015-04-24 MED ORDER — OXYCODONE HCL 5 MG PO TABS
10.0000 mg | ORAL_TABLET | Freq: Three times a day (TID) | ORAL | Status: DC
  Administered 2015-04-24 – 2015-04-25 (×2): 10 mg via ORAL
  Filled 2015-04-24 (×5): qty 2

## 2015-04-24 MED ORDER — SODIUM CHLORIDE 0.9 % WEIGHT BASED INFUSION: 3.0000 mL/kg/h | INTRAVENOUS | Status: AC

## 2015-04-24 MED ORDER — ASPIRIN 300 MG RE SUPP: 300.0000 mg | RECTAL | Status: AC

## 2015-04-24 MED ORDER — ASPIRIN 81 MG PO CHEW
324.0000 mg | CHEWABLE_TABLET | ORAL | Status: AC
  Administered 2015-04-24: 324 mg via ORAL
  Filled 2015-04-24: qty 4

## 2015-04-24 MED ORDER — HEPARIN BOLUS VIA INFUSION
4000.0000 [IU] | Freq: Once | INTRAVENOUS | Status: AC
  Administered 2015-04-24: 4000 [IU] via INTRAVENOUS
  Filled 2015-04-24: qty 4000

## 2015-04-24 MED ORDER — ASPIRIN EC 81 MG PO TBEC: 81.0000 mg | DELAYED_RELEASE_TABLET | Freq: Every day | ORAL | Status: DC

## 2015-04-24 MED ORDER — SODIUM CHLORIDE 0.9 % IJ SOLN
3.0000 mL | Freq: Two times a day (BID) | INTRAMUSCULAR | Status: DC
Start: 2015-04-24 — End: 2015-04-25
  Administered 2015-04-24: 3 mL via INTRAVENOUS

## 2015-04-24 MED ORDER — SODIUM CHLORIDE 0.9 % WEIGHT BASED INFUSION: 1.0000 mL/kg/h | INTRAVENOUS | Status: DC

## 2015-04-24 MED ORDER — ONDANSETRON HCL 4 MG/2ML IJ SOLN: 4.0000 mg | Freq: Four times a day (QID) | INTRAMUSCULAR | Status: DC | PRN

## 2015-04-24 MED ORDER — ASPIRIN 81 MG PO CHEW
81.0000 mg | CHEWABLE_TABLET | ORAL | Status: AC
  Administered 2015-04-25: 81 mg via ORAL
  Filled 2015-04-24: qty 1

## 2015-04-24 MED ORDER — SODIUM CHLORIDE 0.9 % IV SOLN: 250.0000 mL | INTRAVENOUS | Status: DC | PRN

## 2015-04-24 MED ORDER — CARVEDILOL 3.125 MG PO TABS
3.1250 mg | ORAL_TABLET | Freq: Two times a day (BID) | ORAL | Status: DC
  Administered 2015-04-24 – 2015-04-25 (×2): 3.125 mg via ORAL
  Filled 2015-04-24 (×2): qty 1

## 2015-04-24 MED ORDER — SODIUM CHLORIDE 0.9 % IV SOLN
INTRAVENOUS | Status: DC
  Administered 2015-04-24 – 2015-04-25 (×2): via INTRAVENOUS

## 2015-04-24 MED ORDER — SODIUM BICARBONATE 8.4 % IV SOLN
INTRAVENOUS | Status: DC
  Filled 2015-04-24 (×2): qty 1000

## 2015-04-24 MED ORDER — SODIUM CHLORIDE 0.9 % IJ SOLN: 3.0000 mL | INTRAMUSCULAR | Status: DC | PRN

## 2015-04-24 MED ORDER — HEPARIN (PORCINE) IN NACL 100-0.45 UNIT/ML-% IJ SOLN
1200.0000 [IU]/h | INTRAMUSCULAR | Status: DC
  Administered 2015-04-24: 900 [IU]/h via INTRAVENOUS
  Filled 2015-04-24: qty 250

## 2015-04-24 MED ORDER — PANTOPRAZOLE SODIUM 40 MG PO TBEC
40.0000 mg | DELAYED_RELEASE_TABLET | Freq: Two times a day (BID) | ORAL | Status: DC
  Administered 2015-04-24: 40 mg via ORAL
  Filled 2015-04-24: qty 1

## 2015-04-24 NOTE — Progress Notes (Addendum)
ANTICOAGULATION CONSULT NOTE - Initial Consult  Pharmacy Consult for Heparin and Bicarb  Indication: chest pain/ACS and CIN prevention  No Known Allergies  Patient Measurements: Height: 5\' 10"  (177.8 cm) Weight: 160 lb 11.2 oz (72.893 kg) IBW/kg (Calculated) : 73 Heparin Dosing Weight: 72.9 kg  Vital Signs: Temp: 98.3 F (36.8 C) (08/10 1547) Temp Source: Oral (08/10 1547) BP: 116/78 mmHg (08/10 1547) Pulse Rate: 71 (08/10 1547)  Labs:  pending  Medical History: Past Medical History  Diagnosis Date  . Alcoholic   . Chronic back pain   . Upper GI bleed   . Bladder tumor 07-2014  . Hypertension    Assessment:   64 yr old male s/p MI about 6 weeks ago. 3-vessel CAD but not surgical candidate due to comorbidities. For PCI on 04/25/15.   To begin heparin drip tonight, and bicarb protocol pre-cath.   Hx upper GI bleed but reports no recent bleeding. Not on diuretics.   Labs are pending. 03/08/15 labs: Creatinine 2.05, Hgb 11.0, platelet count 227.  Goal of Therapy:  Heparin level 0.3-0.7 units/ml Monitor platelets by anticoagulation protocol: Yes  CIN prevention   Plan:   Heparin 4000 units IV x 1.  Heparin drip be begin at 900 units/hr.  Heparin level ~ 6hrs after drip begins.  Daily heparin level and CBC while on heparin.   Bicarb drip to begin 1 hr pre-cath at ~ 3 ml/kg/hr (225 m/hr) then decrease to ~ 1 ml/kg/hr (75 ml/hr) during procedure and 6 hrs post-procedure.  Daily bmet x 2 days.  Needs dedicated IV line for bicarb infusion.  Arty Baumgartner, [place] Pager: [phone] 04/24/2015,6:39 PM
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
## 2015-04-25 ENCOUNTER — Encounter (HOSPITAL_COMMUNITY)
Admission: AD | Disposition: A | Discharge status: Home / Self Care | Payer: Self-pay | Source: Ambulatory Visit | Attending: Cardiology

## 2015-04-25 ENCOUNTER — Encounter (HOSPITAL_COMMUNITY): Payer: Self-pay | Admitting: Cardiology

## 2015-04-25 HISTORY — PX: CARDIAC CATHETERIZATION: SHX172

## 2015-04-25 LAB — CBC
HEMATOCRIT: 32.7 % — AB (ref 39.0–52.0)
HEMOGLOBIN: 10.8 g/dL — AB (ref 13.0–17.0)
MCH: 29.5 pg (ref 26.0–34.0)
MCHC: 33 g/dL (ref 30.0–36.0)
MCV: 89.3 fL (ref 78.0–100.0)
Platelets: 239 10*3/uL (ref 150–400)
RBC: 3.66 MIL/uL — ABNORMAL LOW (ref 4.22–5.81)
RDW: 16 % — ABNORMAL HIGH (ref 11.5–15.5)
WBC: 6.7 10*3/uL (ref 4.0–10.5)

## 2015-04-25 LAB — BASIC METABOLIC PANEL
Anion gap: 8 (ref 5–15)
BUN: 13 mg/dL (ref 6–20)
CO2: 23 mmol/L (ref 22–32)
CREATININE: 1.81 mg/dL — AB (ref 0.61–1.24)
Calcium: 8.2 mg/dL — ABNORMAL LOW (ref 8.9–10.3)
Chloride: 109 mmol/L (ref 101–111)
GFR calc Af Amer: 45 mL/min — ABNORMAL LOW (ref >60)
GFR, EST NON AFRICAN AMERICAN: 38 mL/min — AB (ref >60)
Glucose, Bld: 95 mg/dL (ref 65–99)
Potassium: 3.9 mmol/L (ref 3.5–5.1)
Sodium: 140 mmol/L (ref 135–145)

## 2015-04-25 LAB — LIPID PANEL
CHOL/HDL RATIO: 2.9 ratio
Cholesterol: 123 mg/dL (ref 0–200)
HDL: 43 mg/dL (ref >40)
LDL Cholesterol: 65 mg/dL (ref 0–99)
TRIGLYCERIDES: 75 mg/dL (ref <150)
VLDL: 15 mg/dL (ref 0–40)

## 2015-04-25 LAB — TROPONIN I
Troponin I: <0.03 ng/mL (ref <0.031)
Troponin I: <0.03 ng/mL (ref <0.031)

## 2015-04-25 LAB — POCT ACTIVATED CLOTTING TIME: ACTIVATED CLOTTING TIME: 349 s

## 2015-04-25 LAB — PROTIME-INR
INR: 1.23 (ref 0.00–1.49)
Prothrombin Time: 15.7 seconds — ABNORMAL HIGH (ref 11.6–15.2)

## 2015-04-25 LAB — HEPARIN LEVEL (UNFRACTIONATED): Heparin Unfractionated: <0.1 IU/mL — ABNORMAL LOW (ref 0.30–0.70)

## 2015-04-25 SURGERY — CORONARY STENT INTERVENTION
Anesthesia: LOCAL

## 2015-04-25 MED ORDER — IOHEXOL 350 MG/ML SOLN
INTRAVENOUS | Status: DC | PRN
  Administered 2015-04-25: 125 mL via INTRAVENOUS

## 2015-04-25 MED ORDER — LISINOPRIL 2.5 MG PO TABS: 2.5000 mg | ORAL_TABLET | Freq: Every day | ORAL | Status: DC

## 2015-04-25 MED ORDER — CLOPIDOGREL BISULFATE 300 MG PO TABS
ORAL_TABLET | ORAL | Status: DC | PRN
  Administered 2015-04-25: 300 mg via ORAL

## 2015-04-25 MED ORDER — LISINOPRIL 2.5 MG PO TABS
2.5000 mg | ORAL_TABLET | Freq: Every day | ORAL | Status: DC
  Administered 2015-04-25: 2.5 mg via ORAL
  Filled 2015-04-25: qty 1

## 2015-04-25 MED ORDER — SODIUM CHLORIDE 0.9 % IJ SOLN
3.0000 mL | Freq: Two times a day (BID) | INTRAMUSCULAR | Status: DC
  Administered 2015-04-25: 3 mL via INTRAVENOUS

## 2015-04-25 MED ORDER — CLOPIDOGREL BISULFATE 300 MG PO TABS
ORAL_TABLET | ORAL | Status: AC
  Filled 2015-04-25: qty 1

## 2015-04-25 MED ORDER — SODIUM CHLORIDE 0.9 % WEIGHT BASED INFUSION: 1.0000 mL/kg/h | INTRAVENOUS | Status: AC

## 2015-04-25 MED ORDER — HEPARIN BOLUS VIA INFUSION
2500.0000 [IU] | Freq: Once | INTRAVENOUS | Status: AC
  Administered 2015-04-25: 2500 [IU] via INTRAVENOUS
  Filled 2015-04-25: qty 2500

## 2015-04-25 MED ORDER — SODIUM CHLORIDE 0.9 % IV SOLN
INTRAVENOUS | Status: DC | PRN
  Administered 2015-04-25: 999 mL via INTRAVENOUS

## 2015-04-25 MED ORDER — FENTANYL CITRATE (PF) 100 MCG/2ML IJ SOLN
INTRAMUSCULAR | Status: DC | PRN
  Administered 2015-04-25: 25 ug via INTRAVENOUS

## 2015-04-25 MED ORDER — MIDAZOLAM HCL 2 MG/2ML IJ SOLN
INTRAMUSCULAR | Status: DC | PRN
  Administered 2015-04-25: 1 mg via INTRAVENOUS

## 2015-04-25 MED ORDER — ONDANSETRON HCL 4 MG/2ML IJ SOLN: 4.0000 mg | Freq: Four times a day (QID) | INTRAMUSCULAR | Status: DC | PRN

## 2015-04-25 MED ORDER — LIDOCAINE HCL (PF) 1 % IJ SOLN
INTRAMUSCULAR | Status: DC | PRN
  Administered 2015-04-25: 08:00:00

## 2015-04-25 MED ORDER — LIDOCAINE HCL (PF) 1 % IJ SOLN
INTRAMUSCULAR | Status: DC | PRN
  Administered 2015-04-25: 15 mL

## 2015-04-25 MED ORDER — ACETAMINOPHEN 325 MG PO TABS: 650.0000 mg | ORAL_TABLET | ORAL | Status: DC | PRN

## 2015-04-25 MED ORDER — SODIUM CHLORIDE 0.9 % IJ SOLN: 3.0000 mL | INTRAMUSCULAR | Status: DC | PRN

## 2015-04-25 MED ORDER — BIVALIRUDIN BOLUS VIA INFUSION - CUPID
INTRAVENOUS | Status: DC | PRN
  Administered 2015-04-25: 54.675 mg via INTRAVENOUS

## 2015-04-25 MED ORDER — SODIUM CHLORIDE 0.9 % IV SOLN
250.0000 mg | INTRAVENOUS | Status: DC | PRN
  Administered 2015-04-25: 1.75 mg/kg/h via INTRAVENOUS

## 2015-04-25 MED ORDER — MIDAZOLAM HCL 2 MG/2ML IJ SOLN
INTRAMUSCULAR | Status: AC
  Filled 2015-04-25: qty 4

## 2015-04-25 MED ORDER — FENTANYL CITRATE (PF) 100 MCG/2ML IJ SOLN
INTRAMUSCULAR | Status: AC
  Filled 2015-04-25: qty 4

## 2015-04-25 MED ORDER — LIDOCAINE HCL (PF) 1 % IJ SOLN
INTRAMUSCULAR | Status: AC
  Filled 2015-04-25: qty 30

## 2015-04-25 MED ORDER — SODIUM CHLORIDE 0.9 % IV SOLN: 250.0000 mL | INTRAVENOUS | Status: DC | PRN

## 2015-04-25 MED ORDER — BIVALIRUDIN 250 MG IV SOLR
INTRAVENOUS | Status: AC
  Filled 2015-04-25: qty 250

## 2015-04-25 MED ORDER — NITROGLYCERIN 1 MG/10 ML FOR IR/CATH LAB
INTRA_ARTERIAL | Status: AC
Start: 2015-04-25 — End: 2015-04-25
  Filled 2015-04-25: qty 10

## 2015-04-25 MED ORDER — HEPARIN (PORCINE) IN NACL 2-0.9 UNIT/ML-% IJ SOLN
INTRAMUSCULAR | Status: AC
  Filled 2015-04-25: qty 1000

## 2015-04-25 SURGICAL SUPPLY — 11 items
CATH INFINITI 5FR ANG PIGTAIL (CATHETERS) ×1 IMPLANT
CATH INFINITI JR4 5F (CATHETERS) ×1 IMPLANT
GUIDE CATH RUNWAY 6FR VL3.5 (CATHETERS) ×1 IMPLANT
KIT ENCORE 26 ADVANTAGE (KITS) ×2 IMPLANT
KIT HEART LEFT (KITS) ×2 IMPLANT
PACK CARDIAC CATHETERIZATION (CUSTOM PROCEDURE TRAY) ×2 IMPLANT
SHEATH PINNACLE 6F 10CM (SHEATH) ×1 IMPLANT
SYR MEDRAD MARK V 150ML (SYRINGE) ×1 IMPLANT
TRANSDUCER W/STOPCOCK (MISCELLANEOUS) ×2 IMPLANT
TUBING CIL FLEX 10 FLL-RA (TUBING) ×2 IMPLANT
WIRE EMERALD 3MM-J .035X150CM (WIRE) ×1 IMPLANT

## 2015-04-25 NOTE — Discharge Summary (Signed)
Discharge summary dictated on 04/25/2015 dictation number is [id number]

## 2015-04-25 NOTE — Discharge Instructions (Signed)
Coronary Angiogram °A coronary angiogram, also called coronary angiography, is an X-ray procedure used to look at the arteries in the heart. In this procedure, a dye (contrast dye) is injected through a long, hollow tube (catheter). The catheter is about the size of a piece of cooked spaghetti and is inserted through your groin, wrist, or arm. The dye is injected into each artery, and X-rays are then taken to show if there is a blockage in the arteries of your heart. °LET YOUR HEALTH CARE PROVIDER KNOW ABOUT: °· Any allergies you have, including allergies to shellfish or contrast dye.   °· All medicines you are taking, including vitamins, herbs, eye drops, creams, and over-the-counter medicines.   °· Previous problems you or members of your family have had with the use of anesthetics.   °· Any blood disorders you have.   °· Previous surgeries you have had. °· History of kidney problems or failure.   °· Other medical conditions you have. °RISKS AND COMPLICATIONS  °Generally, a coronary angiogram is a safe procedure. However, problems can occur and include: °· Allergic reaction to the dye. °· Bleeding from the access site or other locations. °· Kidney injury, especially in people with impaired kidney function.  °· Stroke (rare). °· Heart attack (rare). °BEFORE THE PROCEDURE  °· Do not eat or drink anything after midnight the night before the procedure or as directed by your health care provider.   °· Ask your health care provider about changing or stopping your regular medicines. This is especially important if you are taking diabetes medicines or blood thinners. °PROCEDURE °· You may be given a medicine to help you relax (sedative) before the procedure. This medicine is given through an intravenous (IV) access tube that is inserted into one of your veins.   °· The area where the catheter will be inserted will be washed and shaved. This is usually done in the groin but may be done in the fold of your arm (near your  elbow) or in the wrist.    °· A medicine will be given to numb the area where the catheter will be inserted (local anesthetic).   °· The health care provider will insert the catheter into an artery. The catheter will be guided by using a special type of X-ray (fluoroscopy) of the blood vessel being examined.   °· A special dye will then be injected into the catheter, and X-rays will be taken. The dye will help to show where any narrowing or blockages are located in the heart arteries.   °AFTER THE PROCEDURE  °· If the procedure is done through the leg, you will be kept in bed lying flat for several hours. You will be instructed to not bend or cross your legs. °· The insertion site will be checked frequently.   °· The pulse in your feet or wrist will be checked frequently.   °· Additional blood tests, X-rays, and an electrocardiogram may be done.   °Document Released: 03/07/2003 Document Revised: 01/15/2014 Document Reviewed: 01/23/2013 °Exit[employer]® Patient Information ©2015 [employer]. This information is not intended to replace advice given to you by your health care provider. Make sure you discuss any questions you have with your health care provider. ° °

## 2015-04-25 NOTE — Interval H&P Note (Signed)
Cath Lab Visit (complete for each Cath Lab visit)  Clinical Evaluation Leading to the Procedure:   ACS: No.  Non-ACS:    Anginal Classification: CCS III  Anti-ischemic medical therapy: Maximal Therapy (2 or more classes of medications)  Non-Invasive Test Results: No non-invasive testing performed  Prior CABG: No previous CABG      History and Physical Interval Note:  04/25/2015 7:37 AM  Darren Carr  has presented today for surgery, with the diagnosis of CAD  The various methods of treatment have been discussed with the patient and family. After consideration of risks, benefits and other options for treatment, the patient has consented to  Procedure(s): Coronary Stent Intervention (N/A) as a surgical intervention .  The patient's history has been reviewed, patient examined, no change in status, stable for surgery.  I have reviewed the patient's chart and labs.  Questions were answered to the patient's satisfaction.     Charolette Forward

## 2015-04-25 NOTE — Progress Notes (Addendum)
Site area: RFA Site Prior to Removal:  Level 0 Pressure Applied For:35 min Manual:  yes  Patient Status During Pull:stable   Post Pull Site:  Level 0 Post Pull Instructions Given: yes  Post Pull Pulses Present: palpable Dressing Applied:  pressure Bedrest begins @ 1035 till 1435 Comments:

## 2015-04-25 NOTE — Progress Notes (Signed)
ANTICOAGULATION CONSULT NOTE - Follow Up Consult  Pharmacy Consult for Heparin Indication: chest pain/ACS  No Known Allergies  Patient Measurements: Height: 5\' 10"  (177.8 cm) Weight: 160 lb 11.2 oz (72.893 kg) IBW/kg (Calculated) : 73 Heparin Dosing Weight: 73 kg  Vital Signs: Temp: 98.3 F (36.8 C) (08/10 2028) Temp Source: Oral (08/10 2028) BP: 115/79 mmHg (08/10 2028) Pulse Rate: 63 (08/10 2028)  Labs:  Recent Labs  04/24/15 1854 04/25/15 0108  HGB 11.1*  --   HCT 33.8*  --   PLT 253  --   HEPARINUNFRC  --  <0.10*  CREATININE 2.00*  --   TROPONINI 0.03 <0.03    Estimated Creatinine Clearance: 39.5 mL/min (by C-G formula based on Cr of 2).  Assessment: 64 y.o. male on heparin for r/o ACS. Heparin level undetectable on 900 units/hr. No issues with line or bleeding per RN. Plan for cath this morning - scheduled for 0730.  Goal of Therapy:  Heparin level 0.3-0.7 units/ml Monitor platelets by anticoagulation protocol: Yes   Plan:  Rebolus heparin 2500 units Increase heparin gtt to 1200 units/hr Will f/u post cath  Sherlon Handing, PharmD, BCPS Clinical pharmacist, pager [phone] 04/25/2015,3:21 AM

## 2015-04-25 NOTE — Progress Notes (Signed)
Discharge instructions reviewed with pt and pt wife. VSS. Wife accompanying pt home.

## 2015-04-25 NOTE — Progress Notes (Signed)
UR Completed Jayleah Garbers Graves-Bigelow, RN,BSN [phone]  

## 2015-04-26 NOTE — Discharge Summary (Signed)
Darren Carr, Darren Carr                 ACCOUNT NO.:  [id number]  MEDICAL RECORD NO.:  [id number]  LOCATION:  3W08C                        FACILITY:  [facility]  PHYSICIAN:  Chaise Passarella N. Terrence Dupont, M.D. DATE OF BIRTH:  [birth year]  DATE OF ADMISSION:  04/24/2015 DATE OF DISCHARGE:  04/25/2015                              DISCHARGE SUMMARY   ADMITTING DIAGNOSES: 1. Generalized weakness, fatigue, possibly angina equivalent. 2. Status post recent anterolateral wall myocardial infarction. 3. Multivessel coronary artery disease. 4. Ischemic cardiomyopathy. 5. Hypertension. 6. Hypercholesteremia. 7. Chronic kidney disease, stage 4. 8. History of high-grade bladder carcinoma status post radical     cystoprostatectomy with bilateral pelvic lymphadenopathy and     ileostomy and ileal conduit urinary diversion. 9. Status post acute obstructive uropathy with hydronephrosis status     post left nephrostomy tube. 10.History of upper gastrointestinal bleed. 11.Hypercholesteremia. 12.History of alcohol abuse in the past. 13.History of Escherichia coli bacteremia in the recent past, treated     appropriately for 2 weeks with IV antibiotics. 14.History of urosepsis. 15.History of esophageal stricture status post dilatation in the past.  FINAL DIAGNOSES: 1. Stable angina, myocardial infarction ruled out status post cardiac     catheterization as per procedure report.  Status post recent     inferolateral wall myocardial infarction, stable. 2. Multivessel coronary artery disease angiographically improved. 3. Ischemic dilated cardiomyopathy. 4. Hypertension. 5. Hypercholesteremia. 6. Chronic kidney disease, stage 4. 7. High-grade bladder carcinoma status post radical cystoprostatectomy     with bilateral pelvic lymphadenopathy, ileostomy, and ileal conduit     urinary diversion. 8. Status post acute obstructive uropathy with hydronephrosis status     post left nephrostomy tube placement. 9. History of upper  gastrointestinal bleed. 10.Hypercholesteremia. 11.History of alcohol abuse. 12.History of Escherichia coli bacteremia in the past, treated with IV     antibiotics for 2 weeks. 13.History of urosepsis. 14.History of esophageal stricture status post dilatation in the past.  DISCHARGE HOME MEDICATIONS: 1. Aspirin 81 mg 1 tablet daily. 2. Atorvastatin 40 mg daily. 3. Clopidogrel 75 mg daily. 4. Carvedilol 3.125 mg twice daily. 5. Nitrostat sublingual p.r.n. 6. Oxycodone 10 mg every 4 hours as needed for pain. 7. Protonix 40 mg twice daily. 8. Senokot as needed for constipation. 9. Lisinopril 2.5 mg 1 tablet daily.  DIET:  Low salt, low cholesterol.  The patient has been advised to drink plenty of fluids today.  CONDITION AT DISCHARGE:  Stable.  FOLLOWUP:  Follow up with me in 1 week.  We will check his renal function while on low-dose ACE inhibitor next week.  Follow up with me in 1 week.  Follow up with Urology as scheduled.  Post cardiac cath instructions have been given.  BRIEF HISTORY AND HOSPITAL COURSE:  Mr. Darren Carr is a 64 year old male with past medical history significant for coronary artery disease status post anterolateral wall myocardial infarction approximately 6 weeks ago, presented with vague abdominal pain associated with coffee-ground vomitus, subsequently underwent cardiac catheterization, noted to have 3- vessel disease, felt not a surgical candidate due to multiple comorbidities, was treated medically, hypertension, history of high- grade bladder cancer status post radical cystoprostatectomy with bilateral pelvic lymphadenopathy and  ileal loop conduit urinary diversion, history of right malignant hydronephrosis, history of upper GI bleed, noted to have antral gastritis, hypercholesteremia, history of alcohol abuse, chronic kidney disease stage 4, status post urosepsis, status post E. coli bacteremia, treated for 2 weeks of IV antibiotics, also noted to have  left obstructive uropathy with hydronephrosis requiring left nephrostomy tube approximately 6 weeks ago, is admitted electively for possible PCI to LAD/RCA with staged PCI.  The patient denies any chest pain, shortness of breath, nausea, vomiting.  Denies any abdominal pain.  Denies PND, orthopnea, or leg swelling.  States his activity is very limited.  EKG showed normal sinus rhythm with old anteroseptal wall MI and marked T-wave inversion in the anterolateral leads.  The patient was also noted to have elevated creatinine and is admitted for hydration prior to PCI.  Discussed with the patient and his wife regarding various options of treatment, i.e., medical versus invasive left cath, possible PTCA, stenting, its risks and benefits and consented for PCI.  PHYSICAL EXAMINATION:  GENERAL:  On examination, he was alert, awake, oriented x3. VITAL SIGNS:  Blood pressure was 116/78, pulse was 71, he was afebrile. EYES:  Conjunctivae were pink. NECK:  Supple.  No JVD.  No bruit. LUNGS:  Clear to auscultation. CARDIOVASCULAR:  S1, S2 was normal.  There was soft systolic murmur.  No S3 gallop. ABDOMEN:  Soft.  Bowel sounds present.  Nontender. EXTREMITIES:  There was no clubbing, cyanosis, or edema.  There was left ileostomy and left nephrostomy tube noted.  LABORATORY DATA:  Sodium was 135, potassium 4.3, BUN 15, creatinine was 2.0, post cardiac cath BUN is 13, creatinine 1.81, potassium is 3.9. Three sets of cardiac enzymes were negative.  Cholesterol was 123, triglycerides 75, HDL 43, LDL was 65.  Hemoglobin was 11.1, hematocrit 33.8, white count of 8.8.  Repeat hemoglobin was 10.8, hematocrit 32.7, white count of 6.7.  BRIEF HOSPITAL COURSE:  The patient was admitted overnight and was started on IV normal saline and this morning was started on sodium bicarb drip.  Prior to cardiac catheterization, the patient underwent left cardiac catheterization earlier this morning, was noted to  have markedly improved angiographic stenosis in LAD, left circumflex, and RCA.  The patient tolerated the procedure well.  There were no complications.  Postprocedure, the patient did not have any episodes of chest pain.  His groin is stable with no evidence of hematoma.  Dressing is dry.  The patient is receiving fluid hydration.  The patient's ileostomy and nephrostomy tubes are draining clear liquid.  The patient will be discharged home on above medications.  The patient will be started on low-dose ACE inhibitors.  We will monitor his renal function as an outpatient closely.     Allegra Lai. Terrence Dupont, M.D.     MNH/MEDQ  D:  04/25/2015  T:  04/26/2015  Job:  [id number]  cc:   Allegra Lai. Terrence Dupont, M.D.

## 2015-05-02 ENCOUNTER — Other Ambulatory Visit: Payer: Self-pay | Admitting: Urology

## 2015-05-02 ENCOUNTER — Telehealth: Payer: Self-pay | Admitting: Oncology

## 2015-05-02 DIAGNOSIS — C772 Secondary and unspecified malignant neoplasm of intra-abdominal lymph nodes: Principal | ICD-10-CM

## 2015-05-02 DIAGNOSIS — C679 Malignant neoplasm of bladder, unspecified: Secondary | ICD-10-CM

## 2015-05-02 NOTE — Telephone Encounter (Signed)
New patient appt-s/w patient and gave np appt for 08/26 @ 10:30 w/Dr. Alen Blew. Referring Dr. [name] Dx- Bladder Carcinoma

## 2015-05-08 ENCOUNTER — Ambulatory Visit (HOSPITAL_COMMUNITY)
Admission: RE | Admit: 2015-05-08 | Discharge: 2015-05-08 | Disposition: A | Discharge status: Home / Self Care | Payer: BLUE CROSS/BLUE SHIELD | Source: Ambulatory Visit | Attending: Urology | Admitting: Urology

## 2015-05-08 DIAGNOSIS — C679 Malignant neoplasm of bladder, unspecified: Secondary | ICD-10-CM

## 2015-05-08 DIAGNOSIS — C772 Secondary and unspecified malignant neoplasm of intra-abdominal lymph nodes: Principal | ICD-10-CM

## 2015-05-09 ENCOUNTER — Telehealth: Payer: Self-pay | Admitting: *Deleted

## 2015-05-09 NOTE — Telephone Encounter (Signed)
Called patient to remind him of his appointment tomorrow at [facility].

## 2015-05-10 ENCOUNTER — Ambulatory Visit: Payer: BLUE CROSS/BLUE SHIELD

## 2015-05-10 ENCOUNTER — Ambulatory Visit (HOSPITAL_BASED_OUTPATIENT_CLINIC_OR_DEPARTMENT_OTHER): Payer: BLUE CROSS/BLUE SHIELD | Admitting: Oncology

## 2015-05-10 ENCOUNTER — Encounter: Payer: Self-pay | Admitting: Oncology

## 2015-05-10 ENCOUNTER — Other Ambulatory Visit: Payer: BLUE CROSS/BLUE SHIELD

## 2015-05-10 VITALS — BP 95/65 | HR 77 | Temp 98.1°F | Resp 18 | Ht 70.0 in | Wt 154.4 lb

## 2015-05-10 DIAGNOSIS — N289 Disorder of kidney and ureter, unspecified: Secondary | ICD-10-CM

## 2015-05-10 DIAGNOSIS — C679 Malignant neoplasm of bladder, unspecified: Secondary | ICD-10-CM

## 2015-05-10 NOTE — Consult Note (Signed)
Reason for Referral: Bladder cancer.   HPI: This is a pleasant 64 year old gentleman currently [facility] where he lived the majority of his life. He is a gentleman with polysubstance abuse including alcohol and tobacco who started developing hematuria back in November 2015.  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 On 01/16/2015 he underwent a robotic-assisted cystoprostatectomy and pelvic lymph node dissection and ileal conduit formation under the care of Dr. Tresa Moore. The pathology showed high-grade invasive urothelial carcinoma extending into the perivesical connective tissue. One metastatic lymph node was noted in the right periurethral area. The remaining lymph node dissection was unremarkable. The final pathological staging was T3a N1. He tolerated the procedure well. He did have left hydronephrosis and a right renal atrophy and required a nephrostomy tube placement on June 2016. He was also recently hospitalized for myocardial infarction and underwent catheterization but no intervention was done. His most recent imaging studies done in August 2016 showed no evidence of metastatic disease at this time. I was asked to comment about his recent pathology and the role of systemic therapy.  Clinically, he reports improvement since his recent hospitalization. He is ambulating without any major difficulty. He is able to drive and attends to activities of daily living. He does not report any hematuria or dysuria. Has not reported any constitutional symptoms. He continues to smoke heavily at this time.  She does not report any headaches, blurry vision, syncope or seizures. He does not report any fevers, chills, sweats or weight loss. He does not report any chest pain, palpitation, orthopnea or leg edema. He does not report any cough, hemoptysis or hematemesis. Does not report any  nausea, vomiting, abdominal pain, hematochezia or change in his bowel habits. He does not report any frequency, urgency or hematuria. He does not report any skeletal complaints. Remaining review of system is unremarkable.   Past Medical History  Diagnosis Date  . Alcoholic   . Chronic back pain   . Upper GI bleed   . Hypertension   . Bladder cancer 07/2014  . Anterior wall myocardial infarction 02/23/2015  . History of blood transfusion     "related to bleeding ulcers on my esophagus"  . GERD (gastroesophageal reflux disease)   . Coronary artery disease   :  Past Surgical History  Procedure Laterality Date  . Appendectomy    . Esophagogastroduodenoscopy (egd) with propofol N/A 08/16/2013    Procedure: ESOPHAGOGASTRODUODENOSCOPY (EGD) WITH PROPOFOL Hiatus at 40cm Gastroesophageal Junction at 37cm;  Surgeon: Rogene Houston, MD;  [facility];  Service: Endoscopy;  Laterality: N/A;  . Balloon dilation N/A 08/16/2013    Procedure: BALLOON DILATION to 16.8mm;  Surgeon: Rogene Houston, MD;  Location: [facility];  Service: Endoscopy;  Laterality: N/A;  . Esophageal biopsy  08/16/2013    Procedure: DISTAL ESOPHAGEAL BIOPSIES;  Surgeon: Rogene Houston, MD;  Location: [facility];  Service: Endoscopy;;  . Transurethral resection of prostate  07-2014,08-2014,09-2014  . Transurethral resection of bladder tumor with gyrus (turbt-gyrus) N/A 12/05/2014    Procedure: TRANSURETHRAL RESECTION OF BLADDER TUMOR WITH GYRUS (TURBT-GYRUS);  Surgeon: [name], MD;  [facility];  Service: Urology;  Laterality: N/A;  . Cystoscopy w/ ureteral stent placement Bilateral 12/05/2014    Procedure: CYSTOSCOPY WITH BILATERAL RETROGRADE PYELOGRAM;  Surgeon: [name], MD;  [facility];  Service: Urology;  Laterality: Bilateral;  . Robot assisted laparoscopic complete cystect ileal  conduit N/A 01/16/2015    Procedure: ROBOTIC ASSISTED LAPAROSCOPIC COMPLETE CYSTECT ILEAL CONDUIT/ROBOTIC ASSISTED LAPAROSCOPIC  RADICAL PROSTATECTOMY;  Surgeon: [name], MD;  [facility];  Service: Urology;  Laterality: N/A;  . Lymphadenectomy Bilateral 01/16/2015    Procedure: PELVIC LYMPH NODE DISSECTION;  Surgeon: [name], MD;  [facility];  Service: Urology;  Laterality: Bilateral;  . Cystoscopy with injection N/A 01/16/2015    Procedure: CYSTOSCOPY WITH INJECTION OF INDOCYANINE GREEN DYE;  Surgeon: [name], MD;  [facility];  Service: Urology;  Laterality: N/A;  . Cardiac catheterization N/A 02/23/2015    Procedure: Left Heart Cath and Coronary Angiography;  Surgeon: Charolette Forward, MD;  Location: [place] [facility];  Service: Cardiovascular;  Laterality: N/A;  . Esophagogastroduodenoscopy N/A 02/24/2015    Procedure: ESOPHAGOGASTRODUODENOSCOPY (EGD);  Surgeon: Wilford Corner, MD;  Location: [facility];  Service: Endoscopy;  Laterality: N/A;  . Coronary angioplasty    . Cardiac catheterization N/A 04/25/2015    Procedure: Coronary Stent Intervention;  Surgeon: Charolette Forward, MD;  Location: [place] [facility];  Service: Cardiovascular;  Laterality: N/A;  :   Current outpatient prescriptions:  .  aspirin EC 81 MG EC tablet, Take 1 tablet (81 mg total) by mouth daily., Disp: 30 tablet, Rfl: 3 .  atorvastatin (LIPITOR) 40 MG tablet, Take 1 tablet (40 mg total) by mouth daily at 6 PM., Disp: 30 tablet, Rfl: 3 .  carvedilol (COREG) 3.125 MG tablet, Take 1 tablet (3.125 mg total) by mouth 2 (two) times daily with a meal., Disp: 60 tablet, Rfl: 3 .  clopidogrel (PLAVIX) 75 MG tablet, Take 1 tablet (75 mg total) by mouth daily., Disp: 30 tablet, Rfl: 3 .  lisinopril (PRINIVIL,ZESTRIL) 2.5 MG tablet, Take 1 tablet (2.5 mg total) by mouth daily., Disp: 30 tablet, Rfl: 3 .  nitroGLYCERIN (NITROSTAT) 0.4 MG SL tablet, Place 1 tablet (0.4 mg total) under the tongue every 5 (five) minutes x 3 doses as needed for chest pain., Disp: 25 tablet, Rfl: 12 .  Oxycodone HCl 10 MG TABS, Take 20 mg by mouth  4 (four) times daily., Disp: , Rfl:  .  pantoprazole (PROTONIX) 40 MG tablet, Take 1 tablet (40 mg total) by mouth 2 (two) times daily., Disp: 60 tablet, Rfl: 3 .  senna-docusate (SENOKOT-S) 8.6-50 MG per tablet, Take 1 tablet by mouth 2 (two) times daily. While taking pain meds to prevent constipation, Disp: 30 tablet, Rfl: 0 .  traMADol (ULTRAM) 50 MG tablet, Take 50 mg by mouth every 6 (six) hours as needed. , Disp: , Rfl: 0:  No Known Allergies:  No family history on file.:  Social History   Social History  . Marital Status: Married    Spouse Name: N/A  . Number of Children: N/A  . Years of Education: N/A   Occupational History  . Not on file.   Social History Main Topics  . Smoking status: Current Every Day Smoker -- 1.50 packs/day for 49 years    Types: Cigarettes  . Smokeless tobacco: Never Used  . Alcohol Use: 25.2 oz/week    42 Cans of beer per week     Comment: 04/24/2015 "4-8, 12oz beers/day"  . Drug Use: No  . Sexual Activity: Not Currently    Birth Control/ Protection: None   Other Topics Concern  . Not on file   Social History Narrative  :  Pertinent items are noted in HPI.  Exam: Blood pressure 95/65, pulse 77, temperature 98.1 F (  36.7 C), temperature source Oral, resp. rate 18, height 5\' 10"  (1.778 m), weight 154 lb 6.4 oz (70.035 kg), SpO2 100 %. General appearance: alert and cooperative Head: Normocephalic, without obvious abnormality Throat: lips, mucosa, and tongue normal; teeth and gums normal Neck: no adenopathy Back: negative Resp: clear to auscultation bilaterally Chest wall: no tenderness Cardio: regular rate and rhythm, S1, S2 normal, no murmur, click, rub or gallop GI: soft, non-tender; bowel sounds normal; no masses,  no organomegaly Extremities: extremities normal, atraumatic, no cyanosis or edema Pulses: 2+ and symmetric Skin: Skin color, texture, turgor normal. No rashes or lesions  CBC    Component Value Date/Time   WBC 6.7  04/25/2015 1215   RBC 3.66* 04/25/2015 1215   HGB 10.8* 04/25/2015 1215   HCT 32.7* 04/25/2015 1215   PLT 239 04/25/2015 1215   MCV 89.3 04/25/2015 1215   MCH 29.5 04/25/2015 1215   MCHC 33.0 04/25/2015 1215   RDW 16.0* 04/25/2015 1215   LYMPHSABS 2.2 04/24/2015 1854   MONOABS 1.1* 04/24/2015 1854   EOSABS 0.4 04/24/2015 1854   BASOSABS 0.1 04/24/2015 1854      Chemistry      Component Value Date/Time   NA 140 04/25/2015 1215   K 3.9 04/25/2015 1215   CL 109 04/25/2015 1215   CO2 23 04/25/2015 1215   BUN 13 04/25/2015 1215   CREATININE 1.81* 04/25/2015 1215      Component Value Date/Time   CALCIUM 8.2* 04/25/2015 1215   ALKPHOS 85 04/24/2015 1854   AST 15 04/24/2015 1854   ALT 8* 04/24/2015 1854   BILITOT 0.4 04/24/2015 1854       Dg Chest 2 View  04/23/2015   CLINICAL DATA:  Bladder cancer.  EXAM: CHEST  2 VIEW  COMPARISON:  02/23/2015.  FINDINGS: Mediastinum hilar structures normal the lungs are clear. Heart size normal. Exam stable prior exam.  IMPRESSION: No acute cardiopulmonary disease. Degenerative changes thoracic spine.   Electronically Signed   By: Marcello Moores  Register   On: 04/23/2015 12:45    Assessment and Plan:    64 year old gentleman with the following issues:  1. 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 He is status post robotic cystoprostatectomy in May 2016. The final pathology showed a T3a N1 urothelial carcinoma with positive lymph node at the periureteral level on the right. He does have history of polysubstance abuse and now renal insufficiency with a creatinine of 1.8 and creatinine clearance of close to 30 mL/m.  The rationale of using systemic chemotherapy in this particular setting was discussed with the patient and his wife. Although the role of adjuvant chemotherapy in the setting of bladder cancer has not been completely identified I believe given his high risk cancer there is potential benefit associated with  cis-platinum based chemotherapy. Unfortunately he is not a candidate for this therapy. Given his cardiac disease, renal insufficiency, among other comorbid conditions putting him at a high risk of complication associated with this chemotherapy which does not justify the risk.  Non-cisplatin based regimen has not been shown any benefit in this particular setting.  Complications associated with chemotherapy including nausea, vomiting, myelosuppression, neutropenia, neutropenic sepsis and more importantly renal insufficiency. After discussion with the patient he declined chemotherapy which I think is a reasonable option.  He understands that he has high risk of relapse disease and at that point salvage therapy would be indicated.  I also emphasized the importance of active surveillance including imaging studies in the  future to detect early metastasis. I am happy to do that in the future if Dr. Tresa Moore wants me to do so.  2. Renal insufficiency: He does have hydronephrosis and a percutaneous nephrostomy tube in place and if his kidney function improves in the future systemic chemotherapy can be revisited but I do not think he would be a cisplatin candidate in any case.  3. Tobacco cessation: This was emphasized to the patient today and he is making steps to try to reduce his intake.  4. Follow-up: I am more than happy to see him in the future as needed. I'm also happy to coordinate a surveillance program if needed to.

## 2015-05-10 NOTE — Progress Notes (Signed)
Please see consult note.  

## 2015-05-10 NOTE — Progress Notes (Signed)
Checked in new pt with no financial concerns prior to seeing the dr.  Abbott Carr has [name]'s card for any billing questions, concerns or if financial assistance is needed.

## 2015-05-16 ENCOUNTER — Other Ambulatory Visit: Payer: Self-pay | Admitting: Urology

## 2015-05-16 ENCOUNTER — Ambulatory Visit (HOSPITAL_COMMUNITY)
Admission: RE | Admit: 2015-05-16 | Discharge: 2015-05-16 | Disposition: A | Discharge status: Home / Self Care | Payer: BLUE CROSS/BLUE SHIELD | Source: Ambulatory Visit | Attending: Urology | Admitting: Urology

## 2015-05-16 DIAGNOSIS — C679 Malignant neoplasm of bladder, unspecified: Secondary | ICD-10-CM

## 2015-05-16 DIAGNOSIS — N135 Crossing vessel and stricture of ureter without hydronephrosis: Secondary | ICD-10-CM | POA: Diagnosis not present

## 2015-05-16 DIAGNOSIS — Z906 Acquired absence of other parts of urinary tract: Secondary | ICD-10-CM | POA: Diagnosis not present

## 2015-05-16 DIAGNOSIS — C772 Secondary and unspecified malignant neoplasm of intra-abdominal lymph nodes: Principal | ICD-10-CM

## 2015-05-16 DIAGNOSIS — Z436 Encounter for attention to other artificial openings of urinary tract: Secondary | ICD-10-CM | POA: Diagnosis present

## 2015-05-16 MED ORDER — LIDOCAINE HCL 1 % IJ SOLN
INTRAMUSCULAR | Status: AC
  Filled 2015-05-16: qty 20

## 2015-05-16 MED ORDER — IOHEXOL 300 MG/ML  SOLN
5.0000 mL | Freq: Once | INTRAMUSCULAR | Status: DC | PRN
  Administered 2015-05-16: 5 mL
  Filled 2015-05-16: qty 10

## 2015-07-11 ENCOUNTER — Other Ambulatory Visit: Payer: Self-pay | Admitting: Urology

## 2015-07-11 ENCOUNTER — Ambulatory Visit (HOSPITAL_COMMUNITY)
Admission: RE | Admit: 2015-07-11 | Discharge: 2015-07-11 | Disposition: A | Discharge status: Home / Self Care | Payer: BLUE CROSS/BLUE SHIELD | Source: Ambulatory Visit | Attending: Urology | Admitting: Urology

## 2015-07-11 DIAGNOSIS — N1330 Unspecified hydronephrosis: Secondary | ICD-10-CM | POA: Diagnosis not present

## 2015-07-11 DIAGNOSIS — C772 Secondary and unspecified malignant neoplasm of intra-abdominal lymph nodes: Secondary | ICD-10-CM

## 2015-07-11 DIAGNOSIS — Z436 Encounter for attention to other artificial openings of urinary tract: Secondary | ICD-10-CM | POA: Diagnosis present

## 2015-07-11 DIAGNOSIS — C679 Malignant neoplasm of bladder, unspecified: Secondary | ICD-10-CM

## 2015-07-11 MED ORDER — LIDOCAINE HCL 1 % IJ SOLN
INTRAMUSCULAR | Status: AC
  Filled 2015-07-11: qty 20

## 2015-07-11 MED ORDER — IOHEXOL 300 MG/ML  SOLN
10.0000 mL | Freq: Once | INTRAMUSCULAR | Status: DC | PRN
  Administered 2015-07-11: 10 mL
  Filled 2015-07-11: qty 10

## 2015-07-11 NOTE — Procedures (Signed)
Successful LT PCN EXCHG NO COMP STABLE

## 2015-07-25 ENCOUNTER — Other Ambulatory Visit: Payer: Self-pay | Admitting: Urology

## 2015-07-25 ENCOUNTER — Ambulatory Visit (HOSPITAL_COMMUNITY)
Admission: RE | Admit: 2015-07-25 | Discharge: 2015-07-25 | Disposition: A | Discharge status: Home / Self Care | Payer: BLUE CROSS/BLUE SHIELD | Source: Ambulatory Visit | Attending: Urology | Admitting: Urology

## 2015-07-25 DIAGNOSIS — Z87891 Personal history of nicotine dependence: Secondary | ICD-10-CM | POA: Insufficient documentation

## 2015-07-25 DIAGNOSIS — I10 Essential (primary) hypertension: Secondary | ICD-10-CM | POA: Diagnosis not present

## 2015-07-25 DIAGNOSIS — C672 Malignant neoplasm of lateral wall of bladder: Secondary | ICD-10-CM | POA: Insufficient documentation

## 2015-09-05 ENCOUNTER — Ambulatory Visit (HOSPITAL_COMMUNITY)
Admission: RE | Admit: 2015-09-05 | Discharge: 2015-09-05 | Disposition: A | Discharge status: Home / Self Care | Payer: BLUE CROSS/BLUE SHIELD | Source: Ambulatory Visit | Attending: Urology | Admitting: Urology

## 2015-09-05 ENCOUNTER — Other Ambulatory Visit: Payer: Self-pay | Admitting: Urology

## 2015-09-05 DIAGNOSIS — C772 Secondary and unspecified malignant neoplasm of intra-abdominal lymph nodes: Secondary | ICD-10-CM

## 2015-09-05 DIAGNOSIS — Z436 Encounter for attention to other artificial openings of urinary tract: Secondary | ICD-10-CM | POA: Insufficient documentation

## 2015-09-05 DIAGNOSIS — Z906 Acquired absence of other parts of urinary tract: Secondary | ICD-10-CM | POA: Insufficient documentation

## 2015-09-05 DIAGNOSIS — Z8559 Personal history of malignant neoplasm of other urinary tract organ: Secondary | ICD-10-CM | POA: Insufficient documentation

## 2015-09-05 DIAGNOSIS — N1330 Unspecified hydronephrosis: Secondary | ICD-10-CM | POA: Insufficient documentation

## 2015-09-05 DIAGNOSIS — C679 Malignant neoplasm of bladder, unspecified: Secondary | ICD-10-CM

## 2015-09-05 MED ORDER — LIDOCAINE HCL 1 % IJ SOLN
INTRAMUSCULAR | Status: AC
  Filled 2015-09-05: qty 20

## 2015-09-05 MED ORDER — CEFAZOLIN SODIUM-DEXTROSE 2-3 GM-% IV SOLR
INTRAVENOUS | Status: AC
  Filled 2015-09-05: qty 50

## 2015-09-05 MED ORDER — CEFAZOLIN SODIUM-DEXTROSE 2-3 GM-% IV SOLR
2.0000 g | Freq: Once | INTRAVENOUS | Status: AC
  Administered 2015-09-05: 2 g via INTRAVENOUS

## 2015-09-05 MED ORDER — IOHEXOL 300 MG/ML  SOLN
20.0000 mL | Freq: Once | INTRAMUSCULAR | Status: AC | PRN
  Administered 2015-09-05: 18 mL

## 2015-10-31 ENCOUNTER — Other Ambulatory Visit: Payer: Self-pay | Admitting: Urology

## 2015-10-31 ENCOUNTER — Ambulatory Visit (HOSPITAL_COMMUNITY)
Admission: RE | Admit: 2015-10-31 | Discharge: 2015-10-31 | Disposition: A | Discharge status: Home / Self Care | Payer: BLUE CROSS/BLUE SHIELD | Source: Ambulatory Visit | Attending: Urology | Admitting: Urology

## 2015-10-31 DIAGNOSIS — N135 Crossing vessel and stricture of ureter without hydronephrosis: Secondary | ICD-10-CM | POA: Diagnosis not present

## 2015-10-31 DIAGNOSIS — C772 Secondary and unspecified malignant neoplasm of intra-abdominal lymph nodes: Secondary | ICD-10-CM

## 2015-10-31 DIAGNOSIS — C679 Malignant neoplasm of bladder, unspecified: Secondary | ICD-10-CM

## 2015-10-31 DIAGNOSIS — Z8559 Personal history of malignant neoplasm of other urinary tract organ: Secondary | ICD-10-CM | POA: Insufficient documentation

## 2015-10-31 DIAGNOSIS — Z436 Encounter for attention to other artificial openings of urinary tract: Secondary | ICD-10-CM | POA: Diagnosis not present

## 2015-10-31 MED ORDER — IOHEXOL 300 MG/ML  SOLN
50.0000 mL | Freq: Once | INTRAMUSCULAR | Status: AC | PRN
  Administered 2015-10-31: 5 mL

## 2015-10-31 NOTE — Procedures (Signed)
Successful left sided retrograde NUS exchange.   No immediate complications.   Ronny Bacon, MD Pager #: [phone]

## 2015-12-26 ENCOUNTER — Other Ambulatory Visit: Payer: Self-pay | Admitting: Urology

## 2015-12-26 ENCOUNTER — Ambulatory Visit (HOSPITAL_COMMUNITY)
Admission: RE | Admit: 2015-12-26 | Discharge: 2015-12-26 | Disposition: A | Discharge status: Home / Self Care | Payer: BLUE CROSS/BLUE SHIELD | Source: Ambulatory Visit | Attending: Urology | Admitting: Urology

## 2015-12-26 DIAGNOSIS — C772 Secondary and unspecified malignant neoplasm of intra-abdominal lymph nodes: Secondary | ICD-10-CM | POA: Diagnosis not present

## 2015-12-26 DIAGNOSIS — Z466 Encounter for fitting and adjustment of urinary device: Secondary | ICD-10-CM | POA: Diagnosis present

## 2015-12-26 DIAGNOSIS — C679 Malignant neoplasm of bladder, unspecified: Secondary | ICD-10-CM

## 2015-12-26 MED ORDER — IOPAMIDOL (ISOVUE-300) INJECTION 61%
50.0000 mL | Freq: Once | INTRAVENOUS | Status: AC | PRN
  Administered 2015-12-26: 10 mL

## 2015-12-26 NOTE — Procedures (Signed)
Successful exchange of left nephroureteral catheter with fluoroscopy.  The drain was partially occluded with sediment.  Yellow cloudy fluid was aspirated from left renal collecting system after new 10 French drain placed.  No immediate complication.  Patient was recently treated from UTI, will send urine sample for culture.

## 2015-12-28 LAB — URINE CULTURE: Culture: 50000 — AB

## 2016-01-07 ENCOUNTER — Other Ambulatory Visit: Payer: Self-pay | Admitting: General Surgery

## 2016-01-07 NOTE — Progress Notes (Signed)
Spoke to Dr. Risa Grill with urology on 03-27-16 concerning this patient's urine culture after routine PCN exchange.  It grew proteus mirabilis.  Dr. Risa Grill felt as if the patient was asymptomatic that he would not treat this. No further action was taken.  Madlyn Crosby E

## 2016-01-31 ENCOUNTER — Ambulatory Visit (HOSPITAL_COMMUNITY)
Admission: RE | Admit: 2016-01-31 | Discharge: 2016-01-31 | Disposition: A | Discharge status: Home / Self Care | Payer: BLUE CROSS/BLUE SHIELD | Source: Ambulatory Visit | Attending: Urology | Admitting: Urology

## 2016-01-31 ENCOUNTER — Other Ambulatory Visit: Payer: Self-pay | Admitting: Urology

## 2016-01-31 DIAGNOSIS — C672 Malignant neoplasm of lateral wall of bladder: Secondary | ICD-10-CM | POA: Insufficient documentation

## 2016-02-04 ENCOUNTER — Other Ambulatory Visit: Payer: Self-pay | Admitting: General Surgery

## 2016-02-05 ENCOUNTER — Ambulatory Visit (HOSPITAL_COMMUNITY)
Admission: RE | Admit: 2016-02-05 | Discharge: 2016-02-05 | Disposition: A | Discharge status: Home / Self Care | Payer: BLUE CROSS/BLUE SHIELD | Source: Ambulatory Visit | Attending: Urology | Admitting: Urology

## 2016-02-05 ENCOUNTER — Other Ambulatory Visit: Payer: Self-pay | Admitting: Urology

## 2016-02-05 DIAGNOSIS — C679 Malignant neoplasm of bladder, unspecified: Secondary | ICD-10-CM

## 2016-02-05 DIAGNOSIS — Z906 Acquired absence of other parts of urinary tract: Secondary | ICD-10-CM | POA: Insufficient documentation

## 2016-02-05 DIAGNOSIS — Z466 Encounter for fitting and adjustment of urinary device: Secondary | ICD-10-CM | POA: Insufficient documentation

## 2016-02-05 DIAGNOSIS — Z8551 Personal history of malignant neoplasm of bladder: Secondary | ICD-10-CM | POA: Diagnosis not present

## 2016-02-05 DIAGNOSIS — C772 Secondary and unspecified malignant neoplasm of intra-abdominal lymph nodes: Principal | ICD-10-CM

## 2016-02-05 MED ORDER — IOPAMIDOL (ISOVUE-300) INJECTION 61%
50.0000 mL | Freq: Once | INTRAVENOUS | Status: AC | PRN
  Administered 2016-02-05: 5 mL

## 2016-02-06 ENCOUNTER — Other Ambulatory Visit (HOSPITAL_COMMUNITY): Payer: BLUE CROSS/BLUE SHIELD

## 2016-02-20 ENCOUNTER — Other Ambulatory Visit (HOSPITAL_COMMUNITY): Payer: BLUE CROSS/BLUE SHIELD

## 2016-03-16 NOTE — Patient Instructions (Signed)
Arrive to [facility] at 0800 for 0830 appointment.

## 2016-03-18 ENCOUNTER — Ambulatory Visit (HOSPITAL_COMMUNITY)
Admission: RE | Admit: 2016-03-18 | Discharge: 2016-03-18 | Disposition: A | Discharge status: Home / Self Care | Payer: BLUE CROSS/BLUE SHIELD | Source: Ambulatory Visit | Attending: Urology | Admitting: Urology

## 2016-03-18 ENCOUNTER — Other Ambulatory Visit: Payer: Self-pay | Admitting: Urology

## 2016-03-18 DIAGNOSIS — Z436 Encounter for attention to other artificial openings of urinary tract: Secondary | ICD-10-CM | POA: Diagnosis not present

## 2016-03-18 DIAGNOSIS — C679 Malignant neoplasm of bladder, unspecified: Secondary | ICD-10-CM | POA: Insufficient documentation

## 2016-03-18 DIAGNOSIS — C772 Secondary and unspecified malignant neoplasm of intra-abdominal lymph nodes: Principal | ICD-10-CM

## 2016-03-18 MED ORDER — IOPAMIDOL (ISOVUE-300) INJECTION 61%
5.0000 mL | Freq: Once | INTRAVENOUS | Status: AC | PRN
  Administered 2016-03-18: 5 mL

## 2016-03-18 NOTE — Procedures (Signed)
Exchange retrograde L nephroureteral cath No complication No blood loss. See complete dictation in [facility].

## 2016-04-29 ENCOUNTER — Other Ambulatory Visit: Payer: Self-pay | Admitting: Urology

## 2016-04-29 ENCOUNTER — Ambulatory Visit (HOSPITAL_COMMUNITY)
Admission: RE | Admit: 2016-04-29 | Discharge: 2016-04-29 | Disposition: A | Discharge status: Home / Self Care | Payer: BLUE CROSS/BLUE SHIELD | Source: Ambulatory Visit | Attending: Urology | Admitting: Urology

## 2016-04-29 ENCOUNTER — Encounter (HOSPITAL_COMMUNITY): Payer: Self-pay | Admitting: Interventional Radiology

## 2016-04-29 DIAGNOSIS — C772 Secondary and unspecified malignant neoplasm of intra-abdominal lymph nodes: Principal | ICD-10-CM

## 2016-04-29 DIAGNOSIS — C679 Malignant neoplasm of bladder, unspecified: Secondary | ICD-10-CM

## 2016-04-29 DIAGNOSIS — Z8551 Personal history of malignant neoplasm of bladder: Secondary | ICD-10-CM | POA: Diagnosis not present

## 2016-04-29 DIAGNOSIS — Z436 Encounter for attention to other artificial openings of urinary tract: Secondary | ICD-10-CM | POA: Diagnosis not present

## 2016-04-29 HISTORY — PX: IR GENERIC HISTORICAL: IMG1180011

## 2016-04-29 MED ORDER — IOPAMIDOL (ISOVUE-300) INJECTION 61%
50.0000 mL | Freq: Once | INTRAVENOUS | Status: AC | PRN
  Administered 2016-04-29: 10 mL

## 2016-06-10 ENCOUNTER — Other Ambulatory Visit (HOSPITAL_COMMUNITY): Payer: BLUE CROSS/BLUE SHIELD

## 2016-06-10 ENCOUNTER — Encounter (HOSPITAL_COMMUNITY): Payer: Self-pay | Admitting: Interventional Radiology

## 2016-06-10 ENCOUNTER — Ambulatory Visit (HOSPITAL_COMMUNITY)
Admission: RE | Admit: 2016-06-10 | Discharge: 2016-06-10 | Disposition: A | Discharge status: Home / Self Care | Payer: BLUE CROSS/BLUE SHIELD | Source: Ambulatory Visit | Attending: Urology | Admitting: Urology

## 2016-06-10 ENCOUNTER — Other Ambulatory Visit: Payer: Self-pay | Admitting: Urology

## 2016-06-10 DIAGNOSIS — C772 Secondary and unspecified malignant neoplasm of intra-abdominal lymph nodes: Principal | ICD-10-CM

## 2016-06-10 DIAGNOSIS — Z8551 Personal history of malignant neoplasm of bladder: Secondary | ICD-10-CM | POA: Insufficient documentation

## 2016-06-10 DIAGNOSIS — C679 Malignant neoplasm of bladder, unspecified: Secondary | ICD-10-CM

## 2016-06-10 DIAGNOSIS — Z436 Encounter for attention to other artificial openings of urinary tract: Secondary | ICD-10-CM | POA: Diagnosis present

## 2016-06-10 HISTORY — PX: IR GENERIC HISTORICAL: IMG1180011

## 2016-06-10 MED ORDER — IOPAMIDOL (ISOVUE-300) INJECTION 61%
INTRAVENOUS | Status: DC | PRN
  Administered 2016-06-10: 10 mL via ORAL

## 2016-06-11 ENCOUNTER — Other Ambulatory Visit (HOSPITAL_COMMUNITY): Payer: BLUE CROSS/BLUE SHIELD

## 2016-07-15 ENCOUNTER — Encounter (HOSPITAL_COMMUNITY): Payer: Self-pay | Admitting: Interventional Radiology

## 2016-07-15 ENCOUNTER — Other Ambulatory Visit: Payer: Self-pay | Admitting: Urology

## 2016-07-15 ENCOUNTER — Ambulatory Visit (HOSPITAL_COMMUNITY)
Admission: RE | Admit: 2016-07-15 | Discharge: 2016-07-15 | Disposition: A | Discharge status: Home / Self Care | Payer: BLUE CROSS/BLUE SHIELD | Source: Ambulatory Visit | Attending: Urology | Admitting: Urology

## 2016-07-15 DIAGNOSIS — Z79899 Other long term (current) drug therapy: Secondary | ICD-10-CM | POA: Diagnosis not present

## 2016-07-15 DIAGNOSIS — C679 Malignant neoplasm of bladder, unspecified: Secondary | ICD-10-CM

## 2016-07-15 DIAGNOSIS — C772 Secondary and unspecified malignant neoplasm of intra-abdominal lymph nodes: Principal | ICD-10-CM

## 2016-07-15 DIAGNOSIS — Z436 Encounter for attention to other artificial openings of urinary tract: Secondary | ICD-10-CM | POA: Diagnosis present

## 2016-07-15 DIAGNOSIS — Z7982 Long term (current) use of aspirin: Secondary | ICD-10-CM | POA: Insufficient documentation

## 2016-07-15 DIAGNOSIS — Z7902 Long term (current) use of antithrombotics/antiplatelets: Secondary | ICD-10-CM | POA: Diagnosis not present

## 2016-07-15 HISTORY — PX: IR GENERIC HISTORICAL: IMG1180011

## 2016-07-15 MED ORDER — IOPAMIDOL (ISOVUE-300) INJECTION 61%
10.0000 mL | Freq: Once | INTRAVENOUS | Status: AC | PRN
  Administered 2016-07-15: 10 mL

## 2016-07-17 ENCOUNTER — Telehealth: Payer: Self-pay | Admitting: Physician Assistant

## 2016-07-22 ENCOUNTER — Other Ambulatory Visit (HOSPITAL_COMMUNITY): Payer: BLUE CROSS/BLUE SHIELD

## 2016-08-10 ENCOUNTER — Inpatient Hospital Stay (HOSPITAL_COMMUNITY): Payer: BLUE CROSS/BLUE SHIELD

## 2016-08-10 ENCOUNTER — Encounter (HOSPITAL_COMMUNITY): Payer: Self-pay

## 2016-08-10 ENCOUNTER — Inpatient Hospital Stay (HOSPITAL_COMMUNITY)
Admission: EM | Admit: 2016-08-10 | Discharge: 2016-08-15 | DRG: 694 | Disposition: A | Discharge status: Home / Self Care | Payer: BLUE CROSS/BLUE SHIELD | Attending: Internal Medicine | Admitting: Internal Medicine

## 2016-08-10 DIAGNOSIS — Z720 Tobacco use: Secondary | ICD-10-CM | POA: Diagnosis present

## 2016-08-10 DIAGNOSIS — F17210 Nicotine dependence, cigarettes, uncomplicated: Secondary | ICD-10-CM | POA: Diagnosis present

## 2016-08-10 DIAGNOSIS — N179 Acute kidney failure, unspecified: Secondary | ICD-10-CM | POA: Diagnosis present

## 2016-08-10 DIAGNOSIS — Z8551 Personal history of malignant neoplasm of bladder: Secondary | ICD-10-CM | POA: Diagnosis not present

## 2016-08-10 DIAGNOSIS — N1330 Unspecified hydronephrosis: Secondary | ICD-10-CM | POA: Diagnosis present

## 2016-08-10 DIAGNOSIS — K219 Gastro-esophageal reflux disease without esophagitis: Secondary | ICD-10-CM | POA: Diagnosis present

## 2016-08-10 DIAGNOSIS — F1010 Alcohol abuse, uncomplicated: Secondary | ICD-10-CM | POA: Diagnosis present

## 2016-08-10 DIAGNOSIS — I2510 Atherosclerotic heart disease of native coronary artery without angina pectoris: Secondary | ICD-10-CM | POA: Diagnosis present

## 2016-08-10 DIAGNOSIS — Z7982 Long term (current) use of aspirin: Secondary | ICD-10-CM | POA: Diagnosis not present

## 2016-08-10 DIAGNOSIS — N132 Hydronephrosis with renal and ureteral calculous obstruction: Principal | ICD-10-CM | POA: Diagnosis present

## 2016-08-10 DIAGNOSIS — C679 Malignant neoplasm of bladder, unspecified: Secondary | ICD-10-CM | POA: Diagnosis present

## 2016-08-10 DIAGNOSIS — N183 Chronic kidney disease, stage 3 unspecified: Secondary | ICD-10-CM | POA: Diagnosis present

## 2016-08-10 DIAGNOSIS — D72829 Elevated white blood cell count, unspecified: Secondary | ICD-10-CM | POA: Diagnosis present

## 2016-08-10 DIAGNOSIS — I252 Old myocardial infarction: Secondary | ICD-10-CM

## 2016-08-10 DIAGNOSIS — Z9079 Acquired absence of other genital organ(s): Secondary | ICD-10-CM

## 2016-08-10 DIAGNOSIS — E875 Hyperkalemia: Secondary | ICD-10-CM | POA: Diagnosis present

## 2016-08-10 DIAGNOSIS — Z906 Acquired absence of other parts of urinary tract: Secondary | ICD-10-CM

## 2016-08-10 DIAGNOSIS — I129 Hypertensive chronic kidney disease with stage 1 through stage 4 chronic kidney disease, or unspecified chronic kidney disease: Secondary | ICD-10-CM | POA: Diagnosis present

## 2016-08-10 DIAGNOSIS — R1310 Dysphagia, unspecified: Secondary | ICD-10-CM

## 2016-08-10 DIAGNOSIS — Z936 Other artificial openings of urinary tract status: Secondary | ICD-10-CM | POA: Diagnosis not present

## 2016-08-10 DIAGNOSIS — N200 Calculus of kidney: Secondary | ICD-10-CM

## 2016-08-10 DIAGNOSIS — Z79899 Other long term (current) drug therapy: Secondary | ICD-10-CM

## 2016-08-10 DIAGNOSIS — Z7902 Long term (current) use of antithrombotics/antiplatelets: Secondary | ICD-10-CM

## 2016-08-10 DIAGNOSIS — E872 Acidosis, unspecified: Secondary | ICD-10-CM | POA: Diagnosis present

## 2016-08-10 HISTORY — PX: IR GENERIC HISTORICAL: IMG1180011

## 2016-08-10 HISTORY — DX: ST elevation (STEMI) myocardial infarction involving other sites: I21.29

## 2016-08-10 LAB — PROTIME-INR
INR: 1.01
PROTHROMBIN TIME: 13.3 s (ref 11.4–15.2)

## 2016-08-10 LAB — BASIC METABOLIC PANEL
ANION GAP: 7 (ref 5–15)
BUN: 57 mg/dL — ABNORMAL HIGH (ref 6–20)
CALCIUM: 8.7 mg/dL — AB (ref 8.9–10.3)
CHLORIDE: 112 mmol/L — AB (ref 101–111)
CO2: 15 mmol/L — AB (ref 22–32)
Creatinine, Ser: 3.26 mg/dL — ABNORMAL HIGH (ref 0.61–1.24)
GFR calc non Af Amer: 19 mL/min — ABNORMAL LOW (ref >60)
GFR, EST AFRICAN AMERICAN: 22 mL/min — AB (ref >60)
GLUCOSE: 132 mg/dL — AB (ref 65–99)
POTASSIUM: 6.1 mmol/L — AB (ref 3.5–5.1)
Sodium: 134 mmol/L — ABNORMAL LOW (ref 135–145)

## 2016-08-10 LAB — CBC WITH DIFFERENTIAL/PLATELET
BASOS PCT: 0 %
Basophils Absolute: 0 10*3/uL (ref 0.0–0.1)
Eosinophils Absolute: 0 10*3/uL (ref 0.0–0.7)
Eosinophils Relative: 0 %
HEMATOCRIT: 38.7 % — AB (ref 39.0–52.0)
HEMOGLOBIN: 12.8 g/dL — AB (ref 13.0–17.0)
LYMPHS ABS: 1 10*3/uL (ref 0.7–4.0)
LYMPHS PCT: 6 %
MCH: 28.6 pg (ref 26.0–34.0)
MCHC: 33.1 g/dL (ref 30.0–36.0)
MCV: 86.6 fL (ref 78.0–100.0)
MONO ABS: 1.5 10*3/uL — AB (ref 0.1–1.0)
MONOS PCT: 9 %
NEUTROS ABS: 14.2 10*3/uL — AB (ref 1.7–7.7)
NEUTROS PCT: 85 %
Platelets: 192 10*3/uL (ref 150–400)
RBC: 4.47 MIL/uL (ref 4.22–5.81)
RDW: 16.9 % — AB (ref 11.5–15.5)
WBC: 16.7 10*3/uL — ABNORMAL HIGH (ref 4.0–10.5)

## 2016-08-10 LAB — CBG MONITORING, ED
GLUCOSE-CAPILLARY: 120 mg/dL — AB (ref 65–99)
GLUCOSE-CAPILLARY: 161 mg/dL — AB (ref 65–99)
Glucose-Capillary: 90 mg/dL (ref 65–99)

## 2016-08-10 LAB — MAGNESIUM: Magnesium: 1.9 mg/dL (ref 1.7–2.4)

## 2016-08-10 LAB — PHOSPHORUS: Phosphorus: 4.4 mg/dL (ref 2.5–4.6)

## 2016-08-10 MED ORDER — LIDOCAINE HCL 1 % IJ SOLN
INTRAMUSCULAR | Status: DC | PRN
  Administered 2016-08-10: 10 mL via INTRADERMAL

## 2016-08-10 MED ORDER — PANTOPRAZOLE SODIUM 40 MG PO TBEC
40.0000 mg | DELAYED_RELEASE_TABLET | Freq: Two times a day (BID) | ORAL | Status: DC
  Administered 2016-08-10 – 2016-08-15 (×10): 40 mg via ORAL
  Filled 2016-08-10 (×10): qty 1

## 2016-08-10 MED ORDER — LIDOCAINE HCL 1 % IJ SOLN
INTRAMUSCULAR | Status: AC
  Filled 2016-08-10: qty 20

## 2016-08-10 MED ORDER — MIDAZOLAM HCL 2 MG/2ML IJ SOLN
INTRAMUSCULAR | Status: AC
  Filled 2016-08-10: qty 4

## 2016-08-10 MED ORDER — INSULIN ASPART 100 UNIT/ML IV SOLN
10.0000 [IU] | Freq: Once | INTRAVENOUS | Status: AC
  Administered 2016-08-10: 10 [IU] via INTRAVENOUS
  Filled 2016-08-10: qty 0.1

## 2016-08-10 MED ORDER — HYDROMORPHONE HCL 1 MG/ML IJ SOLN
1.0000 mg | Freq: Once | INTRAMUSCULAR | Status: AC
  Administered 2016-08-10: 1 mg via INTRAVENOUS
  Filled 2016-08-10: qty 1

## 2016-08-10 MED ORDER — CEFAZOLIN SODIUM-DEXTROSE 2-4 GM/100ML-% IV SOLN
2.0000 g | INTRAVENOUS | Status: AC
  Administered 2016-08-10: 2 g via INTRAVENOUS
  Filled 2016-08-10: qty 100

## 2016-08-10 MED ORDER — DEXTROSE 50 % IV SOLN
1.0000 | Freq: Once | INTRAVENOUS | Status: AC
  Administered 2016-08-10: 50 mL via INTRAVENOUS
  Filled 2016-08-10: qty 50

## 2016-08-10 MED ORDER — ATORVASTATIN CALCIUM 40 MG PO TABS
40.0000 mg | ORAL_TABLET | Freq: Every day | ORAL | Status: DC
  Administered 2016-08-10 – 2016-08-14 (×5): 40 mg via ORAL
  Filled 2016-08-10 (×5): qty 1

## 2016-08-10 MED ORDER — MIDAZOLAM HCL 2 MG/2ML IJ SOLN
INTRAMUSCULAR | Status: AC
  Filled 2016-08-10: qty 2

## 2016-08-10 MED ORDER — ALBUTEROL (5 MG/ML) CONTINUOUS INHALATION SOLN
INHALATION_SOLUTION | RESPIRATORY_TRACT | Status: AC
Start: 2016-08-10 — End: 2016-08-11
  Filled 2016-08-10: qty 20

## 2016-08-10 MED ORDER — FENTANYL CITRATE (PF) 100 MCG/2ML IJ SOLN
INTRAMUSCULAR | Status: AC
  Filled 2016-08-10: qty 2

## 2016-08-10 MED ORDER — FENTANYL CITRATE (PF) 100 MCG/2ML IJ SOLN
INTRAMUSCULAR | Status: AC | PRN
  Administered 2016-08-10 (×2): 50 ug via INTRAVENOUS
  Administered 2016-08-10 (×2): 25 ug via INTRAVENOUS
  Administered 2016-08-10: 50 ug via INTRAVENOUS

## 2016-08-10 MED ORDER — OXYCODONE HCL 5 MG PO TABS
5.0000 mg | ORAL_TABLET | ORAL | Status: DC | PRN
  Administered 2016-08-10 – 2016-08-13 (×9): 10 mg via ORAL
  Administered 2016-08-13 (×2): 5 mg via ORAL
  Administered 2016-08-13 – 2016-08-14 (×3): 10 mg via ORAL
  Administered 2016-08-14: 5 mg via ORAL
  Administered 2016-08-15 (×2): 10 mg via ORAL
  Filled 2016-08-10 (×3): qty 2
  Filled 2016-08-10: qty 1
  Filled 2016-08-10 (×14): qty 2

## 2016-08-10 MED ORDER — SODIUM CHLORIDE 0.9% FLUSH
3.0000 mL | Freq: Two times a day (BID) | INTRAVENOUS | Status: DC
  Administered 2016-08-10 – 2016-08-13 (×2): 3 mL via INTRAVENOUS

## 2016-08-10 MED ORDER — ALBUTEROL SULFATE (2.5 MG/3ML) 0.083% IN NEBU: 10.0000 mg | INHALATION_SOLUTION | Freq: Once | RESPIRATORY_TRACT | Status: DC

## 2016-08-10 MED ORDER — IOPAMIDOL (ISOVUE-300) INJECTION 61%
INTRAVENOUS | Status: DC | PRN
  Administered 2016-08-10: 10 mL

## 2016-08-10 MED ORDER — ASPIRIN EC 81 MG PO TBEC
81.0000 mg | DELAYED_RELEASE_TABLET | Freq: Every day | ORAL | Status: DC
  Administered 2016-08-10 – 2016-08-15 (×6): 81 mg via ORAL
  Filled 2016-08-10 (×6): qty 1

## 2016-08-10 MED ORDER — MORPHINE SULFATE (PF) 2 MG/ML IV SOLN
2.0000 mg | INTRAVENOUS | Status: DC | PRN
  Administered 2016-08-10 – 2016-08-11 (×2): 2 mg via INTRAVENOUS
  Filled 2016-08-10 (×2): qty 1

## 2016-08-10 MED ORDER — DEXTROSE-NACL 5-0.9 % IV SOLN
INTRAVENOUS | Status: DC
  Administered 2016-08-10 – 2016-08-15 (×14): via INTRAVENOUS

## 2016-08-10 MED ORDER — NITROGLYCERIN 0.4 MG SL SUBL: 0.4000 mg | SUBLINGUAL_TABLET | SUBLINGUAL | Status: DC | PRN

## 2016-08-10 MED ORDER — ALBUTEROL (5 MG/ML) CONTINUOUS INHALATION SOLN
10.0000 mg/h | INHALATION_SOLUTION | RESPIRATORY_TRACT | Status: AC
  Administered 2016-08-10: 10 mg/h via RESPIRATORY_TRACT

## 2016-08-10 MED ORDER — SODIUM CHLORIDE 0.9 % IV BOLUS (SEPSIS)
500.0000 mL | Freq: Once | INTRAVENOUS | Status: AC
  Administered 2016-08-10: 500 mL via INTRAVENOUS

## 2016-08-10 MED ORDER — IOPAMIDOL (ISOVUE-300) INJECTION 61%
INTRAVENOUS | Status: AC
  Filled 2016-08-10: qty 50

## 2016-08-10 MED ORDER — MIDAZOLAM HCL 2 MG/2ML IJ SOLN
INTRAMUSCULAR | Status: AC | PRN
  Administered 2016-08-10 (×4): 1 mg via INTRAVENOUS

## 2016-08-10 NOTE — Consult Note (Signed)
Reason for Consult:Bladder Cancer, Acute Renal Failure, Right Ureteral Stone  Referring Physician: Velvet Bathe MD  [name] is an 65 y.o. male.   HPI:   1 - Metastatic Bladder Cancer - s/p robotic cystoprostatectomy with ICG sentinel + template pelvic lymphadenectomy and ileal conduit urinary diversion on 01/16/15 for pT3aN1Mx urothelial carcinoma. Not candidate for adjuvant chemo per medical oncology. Most recent imaging surveillance 07/2016 w/o gross disease.   2 - Left Hydronephrosis / Right Renal Atrophy / Stage 3 Renal Insufficiency - had right hydro from bladder cancer obstrucitng Rt UO pre-cystectomy and partial Rt renal atrophy. New Left hydro post-cystectomy and neph tube placed 02/2015 (left Bander stent "fell out" early). Most recent Cr 2-3 and managing with externalized nephro-ureteral stent 12F.  3 - Ileal Conduit - conduit diversion with bricker ureteral anastamoses at cystectomy 01/2015.    4 - Nephrolithiasis / New RIGHT Hydronephrosis - new right ureteral stone with new right hydro by cancer surveillance CT 08/10/16. Also very rapid increase in stone volume on right. Acute Cr rise to 3.2 up from 2.5 and some hyperkalemia to 6.1 on ER labs. Admits to some right flank pain and malaize.   PMH sig for GERD, HTN, stable angina (follows Dr. Terrence Dupont, favorable cath 2016). Extensive smoker (50PY) and daily alcohol intake (6+ beers) but no limitations. His PCP is Particia Nearing, NP in [place].   Today "Ulice Dash" is seen in consultation for above. He had CT scan early this AM at our office which demonstrated new right hydro and in setting of known CRI there rec'd ER eval for stat labs. No fevers but admits to some malaise.    Past Medical History:  Diagnosis Date  . Alcoholic ([place])   . Anterior wall myocardial infarction ([place]) 02/23/2015  . Bladder cancer ([place]) 07/2014  . Chronic back pain   . Coronary artery disease   . GERD (gastroesophageal reflux disease)   . History of blood  transfusion    "related to bleeding ulcers on my esophagus"  . Hypertension   . Kidney stones   . Upper GI bleed     Past Surgical History:  Procedure Laterality Date  . APPENDECTOMY    . BALLOON DILATION N/A 08/16/2013   Procedure: BALLOON DILATION to 16.61m;  Surgeon: NRogene Houston MD;  [facility];  Service: Endoscopy;  Laterality: N/A;  . BIOPSY  08/16/2013   Procedure: DISTAL ESOPHAGEAL BIOPSIES;  Surgeon: NRogene Houston MD;  [facility];  Service: Endoscopy;;  . CARDIAC CATHETERIZATION N/A 02/23/2015   Procedure: Left Heart Cath and Coronary Angiography;  Surgeon: MCharolette Forward MD;  [facility];  Service: Cardiovascular;  Laterality: N/A;  . CARDIAC CATHETERIZATION N/A 04/25/2015   Procedure: Coronary Stent Intervention;  Surgeon: MCharolette Forward MD;  [facility];  Service: Cardiovascular;  Laterality: N/A;  . CORONARY ANGIOPLASTY    . CYSTOSCOPY W/ URETERAL STENT PLACEMENT Bilateral 12/05/2014   Procedure: CYSTOSCOPY WITH BILATERAL RETROGRADE PYELOGRAM;  Surgeon: TAlexis Frock MD;  [facility];  Service: Urology;  Laterality: Bilateral;  . CYSTOSCOPY WITH INJECTION N/A 01/16/2015   Procedure: CYSTOSCOPY WITH INJECTION OF INDOCYANINE GREEN DYE;  Surgeon: TAlexis Frock MD;  [facility];  Service: Urology;  Laterality: N/A;  . ESOPHAGOGASTRODUODENOSCOPY N/A 02/24/2015   Procedure: ESOPHAGOGASTRODUODENOSCOPY (EGD);  Surgeon: VWilford Corner MD;  [facility];  Service: Endoscopy;  Laterality: N/A;  . ESOPHAGOGASTRODUODENOSCOPY (EGD) WITH PROPOFOL N/A 08/16/2013   Procedure: ESOPHAGOGASTRODUODENOSCOPY (EGD) WITH PROPOFOL  Hiatus at 40cm Gastroesophageal Junction at 37cm;  Surgeon: Rogene Houston, MD;  [facility];  Service: Endoscopy;  Laterality: N/A;  . IR GENERIC HISTORICAL  04/29/2016   IR CATHETER TUBE CHANGE 04/29/2016 [facility]  . IR GENERIC HISTORICAL  06/10/2016   IR CATHETER TUBE CHANGE 06/10/2016 Jacqulynn Cadet, MD [facility]  . IR GENERIC HISTORICAL  07/15/2016   IR CATHETER TUBE CHANGE 07/15/2016 Marybelle Killings, MD [facility]  . LYMPHADENECTOMY Bilateral 01/16/2015   Procedure: PELVIC LYMPH NODE DISSECTION;  Surgeon: [name], MD;  [facility];  Service: Urology;  Laterality: Bilateral;  . ROBOT ASSISTED LAPAROSCOPIC COMPLETE CYSTECT ILEAL CONDUIT N/A 01/16/2015   Procedure: ROBOTIC ASSISTED LAPAROSCOPIC COMPLETE CYSTECT ILEAL CONDUIT/ROBOTIC ASSISTED LAPAROSCOPIC RADICAL PROSTATECTOMY;  Surgeon: [name], MD;  [facility];  Service: Urology;  Laterality: N/A;  . TRANSURETHRAL RESECTION OF BLADDER TUMOR WITH GYRUS (TURBT-GYRUS) N/A 12/05/2014   Procedure: TRANSURETHRAL RESECTION OF BLADDER TUMOR WITH GYRUS (TURBT-GYRUS);  Surgeon: [name], MD;  [facility];  Service: Urology;  Laterality: N/A;  . TRANSURETHRAL RESECTION OF PROSTATE  07-2014,08-2014,09-2014    History reviewed. No pertinent family history.  Social History:  reports that he has been smoking Cigarettes.  He has a 73.50 pack-year smoking history. He has never used smokeless tobacco. He reports that he drinks about 25.2 oz of alcohol per week . He reports that he does not use drugs.  Allergies: No Known Allergies  Medications: I have reviewed the patient's current medications.  Results for orders placed or performed during the hospital encounter of 08/10/16 (from the past 48 hour(s))  Basic metabolic panel     Status: Abnormal   Collection Time: 08/10/16  9:45 AM  Result Value Ref Range   Sodium 134 (L) 135 - 145 mmol/L   Potassium 6.1 (H) 3.5 - 5.1 mmol/L   Chloride 112 (H) 101 - 111 mmol/L   CO2 15 (L) 22 - 32 mmol/L   Glucose, Bld 132 (H) 65 - 99 mg/dL   BUN 57 (H) 6 - 20 mg/dL   Creatinine, Ser 3.26 (H) 0.61 - 1.24 mg/dL   Calcium 8.7 (L) 8.9 - 10.3 mg/dL   GFR calc non Af Amer 19 (L) >60 mL/min   GFR calc Af Amer 22 (L) >60 mL/min    Comment: (NOTE) The eGFR has been calculated  using the CKD EPI equation. This calculation has not been validated in all clinical situations. eGFR's persistently <60 mL/min signify possible Chronic Kidney Disease.    Anion gap 7 5 - 15  CBC with Differential     Status: Abnormal   Collection Time: 08/10/16  9:45 AM  Result Value Ref Range   WBC 16.7 (H) 4.0 - 10.5 K/uL   RBC 4.47 4.22 - 5.81 MIL/uL   Hemoglobin 12.8 (L) 13.0 - 17.0 g/dL   HCT 38.7 (L) 39.0 - 52.0 %   MCV 86.6 78.0 - 100.0 fL   MCH 28.6 26.0 - 34.0 pg   MCHC 33.1 30.0 - 36.0 g/dL   RDW 16.9 (H) 11.5 - 15.5 %   Platelets 192 150 - 400 K/uL   Neutrophils Relative % 85 %   Neutro Abs 14.2 (H) 1.7 - 7.7 K/uL   Lymphocytes Relative 6 %   Lymphs Abs 1.0 0.7 - 4.0 K/uL   Monocytes Relative 9 %   Monocytes Absolute 1.5 (H) 0.1 - 1.0 K/uL   Eosinophils Relative 0 %   Eosinophils Absolute 0.0 0.0 -  0.7 K/uL   Basophils Relative 0 %   Basophils Absolute 0.0 0.0 - 0.1 K/uL  CBG monitoring, ED     Status: None   Collection Time: 08/10/16 12:07 PM  Result Value Ref Range   Glucose-Capillary 90 65 - 99 mg/dL    No results found.  Review of Systems  Constitutional: Positive for malaise/fatigue. Negative for fever.  HENT: Negative.   Eyes: Negative.   Respiratory: Negative.   Cardiovascular: Negative.   Gastrointestinal: Negative for vomiting.  Genitourinary: Positive for flank pain.  Skin: Negative.   Neurological: Negative.   Endo/Heme/Allergies: Negative.   Psychiatric/Behavioral: Negative.    Blood pressure 116/87, pulse 83, temperature 97.6 F (36.4 C), temperature source Oral, resp. rate 18, SpO2 98 %. Physical Exam  Constitutional: He appears well-developed.  Some visible malaise, not at baseline.   HENT:  Head: Normocephalic.  Eyes: Pupils are equal, round, and reactive to light.  Neck: Normal range of motion.  Cardiovascular: Normal rate.   Respiratory: Effort normal.  GI: Soft.  RLQ Urostomy pink / patent with distal end of exernalized  nephro-ureteral stent in situ.   Genitourinary: Penis normal.  Genitourinary Comments: Miild Rt CVAT  Musculoskeletal: Normal range of motion.  Neurological: He is alert.  Skin: Skin is warm.  Psychiatric: He has a normal mood and affect. His behavior is normal. Thought content normal.    Assessment/Plan:  1 - Metastatic Bladder Cancer - amazingly, present burden of disease quite scant. This is fortunate.   2 - Left Hydronephrosis / Right Renal Atrophy / Stage 3 Renal Insufficiency - left obstruction likely stricture related, continue nephroureteral stent. May consider formal revision if remains cancer free in future.   3 - Ileal Conduit - stable.   4 - Nephrolithiasis / New RIGHT Hydronephrosis - rec right nephrostomy this admission for decompression and will serve as access to what will need to be multi-stage percuteous surgery with goal of right side stone free in elective setting.   He is on schedule to have right neph tube today.   Greatly appreciate hospitalists / Dr. Jorge Mandril co-management especially in setting of hyperkalemia.   Please call me directly with questions anytime.   Zanna Hawn 08/10/2016, 12:31 PM

## 2016-08-10 NOTE — H&P (Signed)
History and Physical    Darren Carr [id number] DOB: 08-Oct-1950 DOA: 08/10/2016  PCP: Darren Graves, DO Patient coming from: Home  Chief Complaint: Left abdominal discomfort  HPI: Darren Carr is a 65 y.o. male with medical history significant of patient comes in complaining of left abdominal discomfort which started Saturday night. Patient is known to have kidney stone and saw his urologist recommended patient come to the hospital for further evaluation recommendations. He denies any fevers. Has had some chills. Otherwise patient has no other complaints. The pain is localized in his lower abdomen and goes to his groin. The pain is a dull achy discomfort. The pain began insidiously and has been present since onset.  ED Course: Patient was found to have a white blood cell count of 16.7, afebrile, metabolic abnormalities with mild hypokalemia and hyperkalemia 6.1. With serum creatinine elevated at 3.2. We are asked to evaluate patient and admit medically in order to have the surgeon be able to further evaluate and treat patient.  Review of Systems: As per HPI otherwise 10 point review of systems negative.    Past Medical History:  Diagnosis Date  . Alcoholic ([place])   . 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 Bladder cancer ([place]) 07/2014  . Chronic back pain   . Coronary artery disease   . GERD (gastroesophageal reflux disease)   . History of blood transfusion    "related to bleeding ulcers on my esophagus"  . Hypertension   . Kidney stones   . Upper GI bleed     Past Surgical History:  Procedure Laterality Date  . APPENDECTOMY    . BALLOON DILATION N/A 08/16/2013   Procedure: BALLOON DILATION to 16.69mm;  Surgeon: Rogene [place], MD;  Location: [facility];  Service: Endoscopy;  Laterality: N/A;  . BIOPSY  08/16/2013   Procedure: DISTAL ESOPHAGEAL BIOPSIES;  Surgeon: Rogene [place], MD;  Location: [facility];  Service: Endoscopy;;  . CARDIAC CATHETERIZATION N/A  02/23/2015   Procedure: Left Heart Cath and Coronary Angiography;  Surgeon: Charolette Forward, MD;  Location: [place] [facility];  Service: Cardiovascular;  Laterality: N/A;  . CARDIAC CATHETERIZATION N/A 04/25/2015   Procedure: Coronary Stent Intervention;  Surgeon: Charolette Forward, MD;  Location: [place] [facility];  Service: Cardiovascular;  Laterality: N/A;  . CORONARY ANGIOPLASTY    . CYSTOSCOPY W/ URETERAL STENT PLACEMENT Bilateral 12/05/2014   Procedure: CYSTOSCOPY WITH BILATERAL RETROGRADE PYELOGRAM;  Surgeon: [name], MD;  Location: [facility];  Service: Urology;  Laterality: Bilateral;  . CYSTOSCOPY WITH INJECTION N/A 01/16/2015   Procedure: CYSTOSCOPY WITH INJECTION OF INDOCYANINE GREEN DYE;  Surgeon: [name], MD;  Location: [facility];  Service: Urology;  Laterality: N/A;  . ESOPHAGOGASTRODUODENOSCOPY N/A 02/24/2015   Procedure: ESOPHAGOGASTRODUODENOSCOPY (EGD);  Surgeon: Wilford Corner, MD;  Location: [facility];  Service: Endoscopy;  Laterality: N/A;  . ESOPHAGOGASTRODUODENOSCOPY (EGD) WITH PROPOFOL N/A 08/16/2013   Procedure: ESOPHAGOGASTRODUODENOSCOPY (EGD) WITH PROPOFOL Hiatus at 40cm Gastroesophageal Junction at 37cm;  Surgeon: Rogene [place], MD;  Location: [facility];  Service: Endoscopy;  Laterality: N/A;  . IR GENERIC HISTORICAL  04/29/2016   IR CATHETER TUBE CHANGE 04/29/2016 [facility]  . IR GENERIC HISTORICAL  06/10/2016   IR CATHETER TUBE CHANGE 06/10/2016 Jacqulynn Cadet, MD [facility]  . IR GENERIC HISTORICAL  07/15/2016   IR CATHETER TUBE CHANGE 07/15/2016 Marybelle Killings, MD [facility]  . LYMPHADENECTOMY Bilateral 01/16/2015   Procedure: PELVIC LYMPH NODE DISSECTION;  Surgeon: [name], MD;  [facility];  Service: Urology;  Laterality: Bilateral;  . ROBOT ASSISTED LAPAROSCOPIC COMPLETE CYSTECT ILEAL CONDUIT N/A 01/16/2015   Procedure: ROBOTIC ASSISTED LAPAROSCOPIC COMPLETE CYSTECT ILEAL CONDUIT/ROBOTIC ASSISTED LAPAROSCOPIC RADICAL PROSTATECTOMY;  Surgeon:  [name], MD;  [facility];  Service: Urology;  Laterality: N/A;  . TRANSURETHRAL RESECTION OF BLADDER TUMOR WITH GYRUS (TURBT-GYRUS) N/A 12/05/2014   Procedure: TRANSURETHRAL RESECTION OF BLADDER TUMOR WITH GYRUS (TURBT-GYRUS);  Surgeon: [name], MD;  [facility];  Service: Urology;  Laterality: N/A;  . TRANSURETHRAL RESECTION OF PROSTATE  07-2014,08-2014,09-2014     reports that he has been smoking Cigarettes.  He has a 73.50 pack-year smoking history. He has never used smokeless tobacco. He reports that he drinks about 25.2 oz of alcohol per week . He reports that he does not use drugs.  No Known Allergies  Family history: -No family history of urological problems reported when asked directly   Prior to Admission medications   Medication Sig Start Date End Date Taking? Authorizing Provider  aspirin EC 81 MG EC tablet Take 1 tablet (81 mg total) by mouth daily. 03/09/15  Yes Charolette Forward, MD  atorvastatin (LIPITOR) 40 MG tablet Take 1 tablet (40 mg total) by mouth daily at 6 PM. 03/09/15  Yes Charolette Forward, MD  carvedilol (COREG) 3.125 MG tablet Take 1 tablet (3.125 mg total) by mouth 2 (two) times daily with a meal. 03/09/15  Yes Charolette Forward, MD  clopidogrel (PLAVIX) 75 MG tablet Take 1 tablet (75 mg total) by mouth daily. 03/09/15  Yes Charolette Forward, MD  lisinopril (PRINIVIL,ZESTRIL) 2.5 MG tablet Take 1 tablet (2.5 mg total) by mouth daily. 04/25/15  Yes Charolette Forward, MD  nitroGLYCERIN (NITROSTAT) 0.4 MG SL tablet Place 1 tablet (0.4 mg total) under the tongue every 5 (five) minutes x 3 doses as needed for chest pain. 03/09/15  Yes Charolette Forward, MD  Oxycodone HCl 10 MG TABS Take 10-20 mg by mouth every 4 (four) hours as needed (pain).    Yes Historical Provider, MD  pantoprazole (PROTONIX) 40 MG tablet Take 1 tablet (40 mg total) by mouth 2 (two) times daily. 03/09/15  Yes Charolette Forward, MD  senna-docusate (SENOKOT-S) 8.6-50 MG per tablet Take 1 tablet by mouth 2  (two) times daily. While taking pain meds to prevent constipation Patient not taking: Reported on 08/10/2016 01/22/15   [name], MD    Physical Exam: Vitals:   08/10/16 1135 08/10/16 1200 08/10/16 1204 08/10/16 1230  BP: 119/84 111/81 116/87 116/86  Pulse: 71 74 83 75  Resp: 22 22 18 24   Temp:      TempSrc:      SpO2: 99% 97% 98% 100%      Constitutional: NAD, calm, comfortable Vitals:   08/10/16 1135 08/10/16 1200 08/10/16 1204 08/10/16 1230  BP: 119/84 111/81 116/87 116/86  Pulse: 71 74 83 75  Resp: 22 22 18 24   Temp:      TempSrc:      SpO2: 99% 97% 98% 100%   Eyes: PERRL, lids and conjunctivae normal ENMT: Mucous membranes are moist. Posterior pharynx clear of any exudate or lesions.Normal dentition.  Neck: normal, supple, no masses, no thyromegaly Respiratory: clear to auscultation bilaterally, no wheezing, no crackles. Normal respiratory effort. No accessory muscle use.   Cardiovascular: Regular rate and rhythm, no murmurs / rubs / gallops. No extremity edema. 2+ pedal pulses. No carotid bruits.  Abdomen: no tenderness, no masses palpated. No hepatosplenomegaly. Bowel sounds positive.  Musculoskeletal:  no clubbing / cyanosis. No joint deformity upper and lower extremities. Good ROM, no contractures. Normal muscle tone.  Skin: no rashes, lesions, ulcers. No induration Neurologic: CN 3-12 grossly intact. Sensation intact, Strength 5/5 in all 4.  Psychiatric: Normal judgment and insight. Alert and oriented x 3. Normal mood.    Labs on Admission: I have personally reviewed following labs and imaging studies  CBC:  Recent Labs Lab 08/10/16 0945  WBC 16.7*  NEUTROABS 14.2*  HGB 12.8*  HCT 38.7*  MCV 86.6  PLT [id number]   Basic Metabolic Panel:  Recent Labs Lab 08/10/16 0945  NA 134*  K 6.1*  CL 112*  CO2 15*  GLUCOSE 132*  BUN 57*  CREATININE 3.26*  CALCIUM 8.7*   GFR: CrCl cannot be calculated (Unknown ideal weight.). Liver Function Tests: No  results for input(s): AST, ALT, ALKPHOS, BILITOT, PROT, ALBUMIN in the last 168 hours. No results for input(s): LIPASE, AMYLASE in the last 168 hours. No results for input(s): AMMONIA in the last 168 hours. Coagulation Profile: No results for input(s): INR, PROTIME in the last 168 hours. Cardiac Enzymes: No results for input(s): CKTOTAL, CKMB, CKMBINDEX, TROPONINI in the last 168 hours. BNP (last 3 results) No results for input(s): PROBNP in the last 8760 hours. HbA1C: No results for input(s): HGBA1C in the last 72 hours. CBG:  Recent Labs Lab 08/10/16 1207 08/10/16 1239  GLUCAP 90 161*   Lipid Profile: No results for input(s): CHOL, HDL, LDLCALC, TRIG, CHOLHDL, LDLDIRECT in the last 72 hours. Thyroid Function Tests: No results for input(s): TSH, T4TOTAL, FREET4, T3FREE, THYROIDAB in the last 72 hours. Anemia Panel: No results for input(s): VITAMINB12, FOLATE, FERRITIN, TIBC, IRON, RETICCTPCT in the last 72 hours. Urine analysis:    Component Value Date/Time   COLORURINE YELLOW 02/23/2015 2220   APPEARANCEUR CLEAR 02/23/2015 2220   LABSPEC 1.014 02/23/2015 2220   PHURINE 7.0 02/23/2015 2220   GLUCOSEU NEGATIVE 02/23/2015 2220   HGBUR LARGE (A) 02/23/2015 2220   BILIRUBINUR NEGATIVE 02/23/2015 2220   KETONESUR NEGATIVE 02/23/2015 2220   PROTEINUR NEGATIVE 02/23/2015 2220   UROBILINOGEN 0.2 02/23/2015 2220   NITRITE NEGATIVE 02/23/2015 2220   LEUKOCYTESUR LARGE (A) 02/23/2015 2220   Sepsis Labs: !!!!!!!!!!!!!!!!!!!!!!!!!!!!!!!!!!!!!!!!!!!! @LABRCNTIP (procalcitonin:4,lacticidven:4) )No results found for this or any previous visit (from the past 240 hour(s)).   Radiological Exams on Admission: No results found.  EKG: Independently reviewed. Sinus rhythm with no ST elevations or depressions inverted T waves at precordial leads V2-V5  Assessment/Plan Active Problems:   Hyperkalemia - Pt had albuterol and insulin administered in the ED - currently he is NPO. -  administer fluids - monitor on telemetry    Hydronephrosis of left kidney - Urology and IR consulted by ED and aware of patient. Patient currently nothing by mouth and plans are for possible operation today.    Tobacco abuse - Recommended cessation  Leukocytosis - most likely Secondary to stress from pain and kidney stone.   DVT prophylaxis: heparin Code Status: full Family Communication: d/c patient and spouse at bedside Disposition Plan:  telemetry Consults called:Urology: Dr. Tresa Moore, IR Admission status: inpt   Velvet Bathe MD [facility] Pager [phone]  If 7PM-7AM, please contact night-coverage [email or web address] Password [email or web address]  08/10/2016, 1:01 PM   [id number]

## 2016-08-10 NOTE — Progress Notes (Signed)
Referring Physician(s): Darren Carr,Darren Carr  Supervising Physician: Darren Carr  Patient Status:  Darren Carr - In-pt  Chief Complaint:  Right flank pain  Subjective: Patient familiar to Darren Carr service from prior left percutaneous nephrostomy in June 2016. 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 This procedure was complicated by occlusion of the left distal ureter and left-sided hydronephrosis which required percutaneous nephrostomy tube placement as noted above. On 09/05/15 he underwent conversion of the nephrostomy to a left nephroureteral catheter. He has since undergone routine changes of the catheter with last being 07/15/16. He had a Proteus UTI diagnosed in April of this year. He now presents to the ED with 2 to three-day history of right flank/CVA pain. [facility] right hydronephrosis secondary to obstructing stone. Laboratory evaluation revealed WBC 16.7, hemoglobin 12.8, platelets 192k, creatinine 3.26 and potassium of 6.1. He has received insulin and albuterol treatment. He is currently afebrile but does have tremors and chills. He denies hematuria .Request now received Darren Carr right percutaneous nephrostomy tube placement. Past Medical History:  Diagnosis Date  . Alcoholic (Darren Carr Darren Carr)   . Anterior wall myocardial infarction (Darren Carr Darren Carr Darren Carr) 02/23/2015  . Bladder cancer (Darren Carr) 07/2014  . Chronic back pain   . Coronary artery disease   . GERD (gastroesophageal reflux disease)   . History of blood transfusion    "related to bleeding ulcers on my esophagus"  . Hypertension   . Kidney stones   . Upper GI bleed    Past Surgical History:  Procedure Laterality Date  . APPENDECTOMY    . BALLOON DILATION N/A 08/16/2013   Procedure: BALLOON DILATION to 16.28mm;  Surgeon: Darren Carr [place], Darren Carr;  Location: [facility] Carr Dar[facility];  Service: Endoscopy;  Laterality: N/A;  . BIOPSY  08/16/2013   Procedure: DISTAL ESOPHAGEAL BIOPSIES;   Surgeon: Darren Carr [place], Darren Carr;  Location: Darren Carr Darren Carr;  Service: Endoscopy;;  . CARDIAC CATHETERIZATION N/A 02/23/2015   Procedure: Left Heart Cath and Coronary Angiography;  Surgeon: Darren Forward, Darren Carr;  Location: [facility]ren Carr Dar[facility] Carr [facility] Darren [facility];  Service: Cardiovascular;  Laterality: N/A;  . CARDIAC CATHETERIZATION N/A 04/25/2015   Procedure: Coronary Stent Intervention;  Surgeon: Darren Forward, Darren Carr;  Location: Darren Carr Darren Carr Darren Carr;  Service: Cardiovascular;  Laterality: N/A;  . CORONARY ANGIOPLASTY    . CYSTOSCOPY W/ URETERAL STENT PLACEMENT Bilateral 12/05/2014   Procedure: CYSTOSCOPY WITH BILATERAL RETROGRADE PYELOGRAM;  Surgeon: Darren Carr Frock, Darren Carr;  Location: Darren Carr;  Service: Urology;  Laterality: Bilateral;  . CYSTOSCOPY WITH INJECTION N/A 01/16/2015   Procedure: CYSTOSCOPY WITH INJECTION OF INDOCYANINE GREEN DYE;  Surgeon: Darren Carr Frock, Darren Carr;  Location: Darren Carr;  Service: Urology;  Laterality: N/A;  . ESOPHAGOGASTRODUODENOSCOPY N/A 02/24/2015   Procedure: ESOPHAGOGASTRODUODENOSCOPY (EGD);  Surgeon: Darren Carr Corner, Darren Carr;  Location: [facility] Dar[facility] Carr Darren Carr Darren Carr Darren Carr Dar[facility] Carr Dar[facility];  Service: Endoscopy;  Laterality: N/A;  . ESOPHAGOGASTRODUODENOSCOPY (EGD) WITH PROPOFOL N/A 08/16/2013   Procedure: ESOPHAGOGASTRODUODENOSCOPY (EGD) WITH PROPOFOL Hiatus at 40cm Gastroesophageal Junction at 37cm;  Surgeon: Darren Carr [place], Darren Carr;  Location: Darren Carr Darren Carr;  Service: Endoscopy;  Laterality: N/A;  . Darren Carr Darren Carr Darren Carr  04/29/2016   Darren Carr CATHETER TUBE CHANGE 04/29/2016 Darren Carr Darren Carr  . Darren Carr Darren Carr Darren Carr  06/10/2016   Darren Carr CATHETER TUBE CHANGE 06/10/2016 Darren Carr Cadet, Darren Carr Darren Carr Darren Carr  . Darren Carr Darren Carr Darren Carr  07/15/2016   Darren Carr CATHETER TUBE CHANGE 07/15/2016 Darren Carr Killings, Darren Carr Darren Carr Darren Carr  . LYMPHADENECTOMY Bilateral 01/16/2015   Procedure: PELVIC LYMPH NODE DISSECTION;  Surgeon: Darren Carr Frock, Darren Carr;  Location: Darren Carr;  Service: Urology;  Laterality: Bilateral;  . ROBOT ASSISTED LAPAROSCOPIC COMPLETE CYSTECT ILEAL CONDUIT N/A 01/16/2015   Procedure: ROBOTIC ASSISTED  LAPAROSCOPIC COMPLETE CYSTECT ILEAL CONDUIT/ROBOTIC ASSISTED LAPAROSCOPIC RADICAL PROSTATECTOMY;  Surgeon: Darren Carr Frock, Darren Carr;  Location: Darren Carr;  Service: Urology;  Laterality: N/A;  . TRANSURETHRAL RESECTION OF BLADDER TUMOR WITH GYRUS (TURBT-GYRUS) N/A 12/05/2014   Procedure: TRANSURETHRAL RESECTION OF BLADDER TUMOR WITH GYRUS (TURBT-GYRUS);  Surgeon: Darren Carr Frock, Darren Carr;  Location: Darren Carr;  Service: Urology;  Laterality: N/A;  . TRANSURETHRAL RESECTION OF PROSTATE  07-2014,08-2014,09-2014      Allergies: Patient has no known allergies.  Medications: Prior to Admission medications   Medication Sig Start Date End Date Taking? Authorizing Provider  aspirin EC 81 MG EC tablet Take 1 tablet (81 mg total) by mouth daily. 03/09/15  Yes Darren Forward, Darren Carr  atorvastatin (LIPITOR) 40 MG tablet Take 1 tablet (40 mg total) by mouth daily at 6 PM. 03/09/15  Yes Darren Forward, Darren Carr  carvedilol (COREG) 3.125 MG tablet Take 1 tablet (3.125 mg total) by mouth 2 (two) times daily with a meal. 03/09/15  Yes Darren Forward, Darren Carr  clopidogrel (PLAVIX) 75 MG tablet Take 1 tablet (75 mg total) by mouth daily. 03/09/15  Yes Darren Forward, Darren Carr  lisinopril (PRINIVIL,ZESTRIL) 2.5 MG tablet Take 1 tablet (2.5 mg total) by mouth daily. 04/25/15  Yes Darren Forward, Darren Carr  nitroGLYCERIN (NITROSTAT) 0.4 MG SL tablet Place 1 tablet (0.4 mg total) under the tongue every 5 (five) minutes x 3 doses as needed Darren Carr chest pain. 03/09/15  Yes Darren Forward, Darren Carr  Oxycodone HCl 10 MG TABS Take 10-20 mg by mouth every 4 (four) hours as needed (pain).    Yes Darren Carr Provider, Darren Carr  pantoprazole (PROTONIX) 40 MG tablet Take 1 tablet (40 mg total) by mouth 2 (two) times daily. 03/09/15  Yes Darren Forward, Darren Carr  senna-docusate (SENOKOT-S) 8.6-50 MG per tablet Take 1 tablet by mouth 2 (two) times daily. While taking pain meds to prevent constipation Patient not taking: Reported on 08/10/2016 01/22/15   Darren Carr Frock, Darren Carr     Vital Signs: BP 122/84 (BP  Location: Left Arm)   Pulse 99   Temp 98.3 F (36.8 C) (Oral)   Resp 20   SpO2 99%   Physical Exam  patient awake and alert. Chest with clear breath sounds bilaterally. Heart with regular rate and rhythm. Abdomen soft, intact urostomy draining slightly turbid yellow urine;mild- mod rt CVA tenderness;  lower extremities -no edema. Imaging: No results found.  Labs:  CBC:  Recent Labs  08/10/16 0945  WBC 16.7*  HGB 12.8*  HCT 38.7*  PLT 192    COAGS:  Recent Labs  08/10/16 1250  INR 1.01    BMP:  Recent Labs  08/10/16 0945  NA 134*  K 6.1*  CL 112*  CO2 15*  GLUCOSE 132*  BUN 57*  CALCIUM 8.7*  CREATININE 3.26*  GFRNONAA 19*  GFRAA 22*    LIVER FUNCTION TESTS: No results Darren Carr input(s): BILITOT, AST, ALT, ALKPHOS, PROT, ALBUMIN in the last 8760 hours.  Assessment and Plan: Pt with known history of urothelial cell carcinoma status post cystectomy with bilateral diverting ureteral ileostomy. This procedure was complicated by occlusion of the left distal ureter and left-sided hydronephrosis which required percutaneous nephrostomy tube placement on 03/06/15. On 09/05/15 he underwent conversion of the nephrostomy to a left nephroureteral catheter. He has since undergone routine changes of the catheter with last being 07/15/16. He had a Proteus UTI diagnosed in  April of this year. He now presents to the ED with 2 to three-day history of right flank/CVA pain. [facility] revealed right hydronephrosis secondary to obstructing stone. Laboratory evaluation revealed WBC 16.7, hemoglobin 12.8, platelets 192k, creatinine 3.26 and potassium of 6.1. He has received insulin and albuterol treatment. He is currently afebrile but does have tremors and chills. He denies hematuria .Request now received Darren Carr right percutaneous nephrostomy tube placement. Latest imaging studies were reviewed by Dr. Kathlene Cote. Risks and benefits discussed with the patient/wife including, but  not limited to infection, bleeding, significant bleeding causing loss or decrease in renal function or damage to adjacent structures. All of the patient's questions were answered, patient is agreeable to proceed.Consent signed and in chart. Procedure scheduled Darren Carr later today.      Electronically Signed: D. Rowe Robert 08/10/2016, 2:41 PM   Darren Carr spent a total of 25 minutes at the the patient's bedside AND on the patient's Carr floor or unit, greater than 50% of which was counseling/coordinating care Darren Carr right percutaneous nephrostomy    Patient ID: Darren Carr, male   DOB: Jul 20, 1951, 65 y.o.   MRN: [id number]

## 2016-08-10 NOTE — ED Triage Notes (Signed)
Pt c/o R flank pain x 2 days.  Pain score 10/10.  Pt reports taking prescribed pain medication w/o relief.  Pt reports having CT scan this morning and sent by Tammi Klippel MD.  Hx of kidney stones.

## 2016-08-10 NOTE — Procedures (Signed)
Interventional Radiology Procedure Note  Procedure:  Right percutaneous nephrostomy  Complications:  None  Estimated Blood Loss: < 10 mL  Findings:  Urine from obstructed right kidney grossly infected.  10 Fr PCN placed, formed in renal pelvis and attached to gravity bag. Urine sample sent for culture.  Left conduit catheter exchanged will be deferred until later date.  Venetia Night. Kathlene Cote, M.D Pager:  [phone]

## 2016-08-10 NOTE — ED Provider Notes (Signed)
[facility] DEPT Provider Note   CSN: [id number] Arrival date & time: 08/10/16  0847     History   Chief Complaint Chief Complaint  Patient presents with  . Flank Pain    HPI Darren Carr is a 65 y.o. male who presents with flank pain. PMH significant for bladder cancer s/p resection in 2016, s/p cystoprostatectomy, pelvic lymph node dissection, and ileal conduit formation by Dr. Tresa Moore. He has CKD with baseline SCr of 2.5, L hydronephrosis, and right renal atrophy requring nepohrostomy tube placement in June 2016. It was decided that chemo was not a viable option. He gets catheter tube changes every several months - last changed in July. He had an acute onset of R flank pain for the past 2 days. He took a Hydrocodone this morning at 6 AM with no relief. He had an appointment with Dr. Tresa Moore this morning and had a CT scan which showed obstructing kidney stone on the R side. Denies fever, chills, abdominal pain, N/V/D. His nephrostomy bag is draining well. He endorses poor PO intake.  HPI  Past Medical History:  Diagnosis Date  . Alcoholic ([place])   . 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 Bladder cancer ([place]) 07/2014  . Chronic back pain   . Coronary artery disease   . GERD (gastroesophageal reflux disease)   . History of blood transfusion    "related to bleeding ulcers on my esophagus"  . Hypertension   . Kidney stones   . Upper GI bleed     Patient Active Problem List   Diagnosis Date Noted  . Acute MI, anterolateral wall, subsequent episode of care ([place]) 04/24/2015  . Hydronephrosis of left kidney   . Hematemesis 02/24/2015  . Acute MI, lateral wall ([place]) 02/23/2015  . Bladder cancer ([place]) 01/16/2015  . ETOH abuse 07/25/2013  . Tobacco abuse 07/25/2013  . Dysphagia, unspecified(787.20) 07/25/2013    Past Surgical History:  Procedure Laterality Date  . APPENDECTOMY    . BALLOON DILATION N/A 08/16/2013   Procedure: BALLOON DILATION to 16.78mm;   Surgeon: Rogene Houston, MD;  Location: [facility];  Service: Endoscopy;  Laterality: N/A;  . BIOPSY  08/16/2013   Procedure: DISTAL ESOPHAGEAL BIOPSIES;  Surgeon: Rogene Houston, MD;  Location: [facility];  Service: Endoscopy;;  . CARDIAC CATHETERIZATION N/A 02/23/2015   Procedure: Left Heart Cath and Coronary Angiography;  Surgeon: Charolette Forward, MD;  Location: [facility];  Service: Cardiovascular;  Laterality: N/A;  . CARDIAC CATHETERIZATION N/A 04/25/2015   Procedure: Coronary Stent Intervention;  Surgeon: Charolette Forward, MD;  Location: [facility];  Service: Cardiovascular;  Laterality: N/A;  . CORONARY ANGIOPLASTY    . CYSTOSCOPY W/ URETERAL STENT PLACEMENT Bilateral 12/05/2014   Procedure: CYSTOSCOPY WITH BILATERAL RETROGRADE PYELOGRAM;  Surgeon: [name], MD;  Location: [facility];  Service: Urology;  Laterality: Bilateral;  . CYSTOSCOPY WITH INJECTION N/A 01/16/2015   Procedure: CYSTOSCOPY WITH INJECTION OF INDOCYANINE GREEN DYE;  Surgeon: [name], MD;  Location: [facility];  Service: Urology;  Laterality: N/A;  . ESOPHAGOGASTRODUODENOSCOPY N/A 02/24/2015   Procedure: ESOPHAGOGASTRODUODENOSCOPY (EGD);  Surgeon: Wilford Corner, MD;  Location: [facility]COPY;  Service: Endoscopy;  Laterality: N/A;  . ESOPHAGOGASTRODUODENOSCOPY (EGD) WITH PROPOFOL N/A 08/16/2013   Procedure: ESOPHAGOGASTRODUODENOSCOPY (EGD) WITH PROPOFOL Hiatus at 40cm Gastroesophageal Junction at 37cm;  Surgeon: Rogene Houston, MD;  Location: [facility];  Service: Endoscopy;  Laterality: N/A;  . IR GENERIC HISTORICAL  04/29/2016   IR CATHETER TUBE  CHANGE 04/29/2016 [facility]  . IR GENERIC HISTORICAL  06/10/2016   IR CATHETER TUBE CHANGE 06/10/2016 Jacqulynn Cadet, MD [facility]  . IR GENERIC HISTORICAL  07/15/2016   IR CATHETER TUBE CHANGE 07/15/2016 Marybelle Killings, MD [facility]  . LYMPHADENECTOMY Bilateral 01/16/2015   Procedure: PELVIC LYMPH NODE DISSECTION;  Surgeon: [name], MD;  Location: [facility];   Service: Urology;  Laterality: Bilateral;  . ROBOT ASSISTED LAPAROSCOPIC COMPLETE CYSTECT ILEAL CONDUIT N/A 01/16/2015   Procedure: ROBOTIC ASSISTED LAPAROSCOPIC COMPLETE CYSTECT ILEAL CONDUIT/ROBOTIC ASSISTED LAPAROSCOPIC RADICAL PROSTATECTOMY;  Surgeon: [name], MD;  Location: [facility];  Service: Urology;  Laterality: N/A;  . TRANSURETHRAL RESECTION OF BLADDER TUMOR WITH GYRUS (TURBT-GYRUS) N/A 12/05/2014   Procedure: TRANSURETHRAL RESECTION OF BLADDER TUMOR WITH GYRUS (TURBT-GYRUS);  Surgeon: [name], MD;  Location: [facility];  Service: Urology;  Laterality: N/A;  . TRANSURETHRAL RESECTION OF PROSTATE  07-2014,08-2014,09-2014     Home Medications    Prior to Admission medications   Medication Sig Start Date End Date Taking? Authorizing Provider  aspirin EC 81 MG EC tablet Take 1 tablet (81 mg total) by mouth daily. 03/09/15   Charolette Forward, MD  atorvastatin (LIPITOR) 40 MG tablet Take 1 tablet (40 mg total) by mouth daily at 6 PM. 03/09/15   Charolette Forward, MD  carvedilol (COREG) 3.125 MG tablet Take 1 tablet (3.125 mg total) by mouth 2 (two) times daily with a meal. 03/09/15   Charolette Forward, MD  clopidogrel (PLAVIX) 75 MG tablet Take 1 tablet (75 mg total) by mouth daily. 03/09/15   Charolette Forward, MD  lisinopril (PRINIVIL,ZESTRIL) 2.5 MG tablet Take 1 tablet (2.5 mg total) by mouth daily. 04/25/15   Charolette Forward, MD  nitroGLYCERIN (NITROSTAT) 0.4 MG SL tablet Place 1 tablet (0.4 mg total) under the tongue every 5 (five) minutes x 3 doses as needed for chest pain. 03/09/15   Charolette Forward, MD  Oxycodone HCl 10 MG TABS Take 20 mg by mouth 4 (four) times daily.    Historical Provider, MD  pantoprazole (PROTONIX) 40 MG tablet Take 1 tablet (40 mg total) by mouth 2 (two) times daily. 03/09/15   Charolette Forward, MD  senna-docusate (SENOKOT-S) 8.6-50 MG per tablet Take 1 tablet by mouth 2 (two) times daily. While taking pain meds to prevent constipation 01/22/15   [name], MD  traMADol (ULTRAM)  50 MG tablet Take 50 mg by mouth every 6 (six) hours as needed.  02/19/15   Historical Provider, MD    Family History History reviewed. No pertinent family history.  Social History Social History  Substance Use Topics  . Smoking status: Current Every Day Smoker    Packs/day: 1.50    Years: 49.00    Types: Cigarettes  . Smokeless tobacco: Never Used  . Alcohol use 25.2 oz/week    42 Cans of beer per week     Comment: 04/24/2015 "4-8, 12oz beers/day"     Allergies   Patient has no known allergies.   Review of Systems Review of Systems  Constitutional: Positive for appetite change. Negative for chills and fever.  Respiratory: Negative for shortness of breath.   Cardiovascular: Negative for chest pain.  Gastrointestinal: Negative for abdominal pain, diarrhea, nausea and vomiting.  Genitourinary: Positive for flank pain.     Physical Exam Updated Vital Signs BP 107/78   Pulse 84   Temp 97.6 F (36.4 C) (Oral)   Resp 16   SpO2 99%   Physical Exam  Constitutional: He  is oriented to person, place, and time. He appears well-developed and well-nourished. No distress.  Mild rigors  HENT:  Head: Normocephalic and atraumatic.  Eyes: Conjunctivae are normal. Pupils are equal, round, and reactive to light. Right eye exhibits no discharge. Left eye exhibits no discharge. No scleral icterus.  Neck: Normal range of motion.  Cardiovascular: Normal rate and regular rhythm.  Exam reveals no gallop and no friction rub.   No murmur heard. Pulmonary/Chest: Effort normal and breath sounds normal. No respiratory distress. He has no wheezes. He has no rales. He exhibits no tenderness.  Abdominal: Soft. Bowel sounds are normal. He exhibits no distension and no mass. There is tenderness. There is no rebound and no guarding. No hernia.  Nephrostomy bag in place with yellow urine and pink stoma which is non-tender. R CVA tenderness  Neurological: He is alert and oriented to person, place, and  time.  Skin: Skin is warm and dry.  Psychiatric: He has a normal mood and affect. His behavior is normal.  Nursing note and vitals reviewed.    ED Treatments / Results  Labs (all labs ordered are listed, but only abnormal results are displayed) Labs Reviewed  BASIC METABOLIC PANEL - Abnormal; Notable for the following:       Result Value   Sodium 134 (*)    Potassium 6.1 (*)    Chloride 112 (*)    CO2 15 (*)    Glucose, Bld 132 (*)    BUN 57 (*)    Creatinine, Ser 3.26 (*)    Calcium 8.7 (*)    GFR calc non Af Amer 19 (*)    GFR calc Af Amer 22 (*)    All other components within normal limits  CBC WITH DIFFERENTIAL/PLATELET - Abnormal; Notable for the following:    WBC 16.7 (*)    Hemoglobin 12.8 (*)    HCT 38.7 (*)    RDW 16.9 (*)    Neutro Abs 14.2 (*)    Monocytes Absolute 1.5 (*)    All other components within normal limits  CBG MONITORING, ED    EKG  EKG Interpretation None       Radiology No results found.  Procedures Procedures (including critical care time)  Medications Ordered in ED Medications  HYDROmorphone (DILAUDID) injection 1 mg (not administered)  albuterol (PROVENTIL) (2.5 MG/3ML) 0.083% nebulizer solution 10 mg (not administered)  HYDROmorphone (DILAUDID) injection 1 mg (1 mg Intravenous Given 08/10/16 1137)  sodium chloride 0.9 % bolus 500 mL (500 mLs Intravenous New Bag/Given 08/10/16 1136)  dextrose 50 % solution 50 mL (50 mLs Intravenous Given 08/10/16 1210)  insulin aspart (novoLOG) injection 10 Units (10 Units Intravenous Given 08/10/16 1208)     Initial Impression / Assessment and Plan / ED Course  I have reviewed the triage vital signs and the nursing notes.  Pertinent labs & imaging results that were available during my care of the patient were reviewed by me and considered in my medical decision making (see chart for details).  Clinical Course    66 year old male presents with R obstructing kidney stone. Patient is  afebrile, not tachycardic or tachypneic, normotensive, and not hypoxic. CBC remarkable for leukocytosis of 16.7 and mild anemia. CMP remarkable for hyponatremia (134), hyperkalemia (6.1), SCr of 3.26 (2.5 is baseline per Dr. Tresa Moore), glucose of 132.   Pain is uncontrolled - 1mg  Dilaudid given without relief. Another 1mg  ordered. Meds for hyperkalemia given - 10 units insulin and albuterol. EKG does  not show QT prolongation or peaked T waves.   Spoke with Dr. Tresa Moore who will take patient to OR for R nephrostomy tube later this evening. He will do a UA at that time. Spoke with IR who will come see patient as well. Spoke with Dr. Wendee Beavers with hospitalist service who will admit patient. Appreciate assistance.  Final Clinical Impressions(s) / ED Diagnoses   Final diagnoses:  Right kidney stone  Hyperkalemia    New Prescriptions New Prescriptions   No medications on file     Recardo Evangelist, PA-C 08/10/16 [place], MD 08/11/16 [phone]

## 2016-08-10 NOTE — ED Notes (Signed)
Respiratory called for breathing treatment.

## 2016-08-11 ENCOUNTER — Encounter (HOSPITAL_COMMUNITY): Payer: Self-pay | Admitting: Internal Medicine

## 2016-08-11 ENCOUNTER — Inpatient Hospital Stay (HOSPITAL_COMMUNITY): Payer: BLUE CROSS/BLUE SHIELD

## 2016-08-11 DIAGNOSIS — E872 Acidosis, unspecified: Secondary | ICD-10-CM | POA: Diagnosis present

## 2016-08-11 DIAGNOSIS — I252 Old myocardial infarction: Secondary | ICD-10-CM

## 2016-08-11 DIAGNOSIS — F1010 Alcohol abuse, uncomplicated: Secondary | ICD-10-CM

## 2016-08-11 DIAGNOSIS — N183 Chronic kidney disease, stage 3 unspecified: Secondary | ICD-10-CM | POA: Diagnosis present

## 2016-08-11 DIAGNOSIS — N200 Calculus of kidney: Secondary | ICD-10-CM

## 2016-08-11 DIAGNOSIS — N179 Acute kidney failure, unspecified: Secondary | ICD-10-CM

## 2016-08-11 DIAGNOSIS — C679 Malignant neoplasm of bladder, unspecified: Secondary | ICD-10-CM

## 2016-08-11 HISTORY — PX: IR GENERIC HISTORICAL: IMG1180011

## 2016-08-11 LAB — BASIC METABOLIC PANEL
Anion gap: 7 (ref 5–15)
BUN: 55 mg/dL — ABNORMAL HIGH (ref 6–20)
CHLORIDE: 117 mmol/L — AB (ref 101–111)
CO2: 13 mmol/L — ABNORMAL LOW (ref 22–32)
CREATININE: 3.44 mg/dL — AB (ref 0.61–1.24)
Calcium: 7.7 mg/dL — ABNORMAL LOW (ref 8.9–10.3)
GFR calc non Af Amer: 18 mL/min — ABNORMAL LOW (ref >60)
GFR, EST AFRICAN AMERICAN: 20 mL/min — AB (ref >60)
Glucose, Bld: 138 mg/dL — ABNORMAL HIGH (ref 65–99)
Potassium: 5.2 mmol/L — ABNORMAL HIGH (ref 3.5–5.1)
SODIUM: 137 mmol/L (ref 135–145)

## 2016-08-11 LAB — CBC
HCT: 34 % — ABNORMAL LOW (ref 39.0–52.0)
Hemoglobin: 11.2 g/dL — ABNORMAL LOW (ref 13.0–17.0)
MCH: 28.6 pg (ref 26.0–34.0)
MCHC: 32.9 g/dL (ref 30.0–36.0)
MCV: 86.7 fL (ref 78.0–100.0)
PLATELETS: 144 10*3/uL — AB (ref 150–400)
RBC: 3.92 MIL/uL — AB (ref 4.22–5.81)
RDW: 17.4 % — ABNORMAL HIGH (ref 11.5–15.5)
WBC: 13.2 10*3/uL — AB (ref 4.0–10.5)

## 2016-08-11 MED ORDER — ACETAMINOPHEN 325 MG PO TABS
650.0000 mg | ORAL_TABLET | Freq: Four times a day (QID) | ORAL | Status: DC | PRN
  Administered 2016-08-11: 650 mg via ORAL
  Filled 2016-08-11: qty 2

## 2016-08-11 MED ORDER — IOPAMIDOL (ISOVUE-300) INJECTION 61%
50.0000 mL | Freq: Once | INTRAVENOUS | Status: AC | PRN
  Administered 2016-08-11: 5 mL

## 2016-08-11 MED ORDER — SODIUM BICARBONATE 650 MG PO TABS
1300.0000 mg | ORAL_TABLET | Freq: Three times a day (TID) | ORAL | Status: DC
  Administered 2016-08-11 – 2016-08-15 (×13): 1300 mg via ORAL
  Filled 2016-08-11 (×13): qty 2

## 2016-08-11 MED ORDER — IOPAMIDOL (ISOVUE-300) INJECTION 61%
INTRAVENOUS | Status: AC
Start: 2016-08-11 — End: 2016-08-11
  Administered 2016-08-11: 5 mL
  Filled 2016-08-11: qty 50

## 2016-08-11 NOTE — Progress Notes (Signed)
Progress Note    Darren Carr  [id number] DOB: 06/11/1951  DOA: 08/10/2016 PCP: [name], DO    Brief Narrative:   Chief complaint: Follow-up left-sided abdominal pain  Darren Carr is an 65 y.o. male with a PMH of nephrolithiasis who was admitted 08/10/16 after his urologist recommended that the patient be evaluated in the ED due to severe left-sided abdominal pain. On presentation, WBC was 16.7, creatinine was 3.2 and he was hyperkalemic with potassium of 6.1.  Assessment/Plan:   Principal Problem:   Hydronephrosis of Right kidney/Nephrolithiasis Has a history of left-sided hydronephrosis which has been managed with an externalized nephro-ureteral stent. Now with new right ureteral stone/right hydronephrosis with a rise in his creatinine from 2.5--->3.2. Seen by urologist with recommendations to proceed with a right-sided nephrostomy tube for decompression.  Active Problems:   Acute renal failure/Stage III chronic kidney disease/metabolic acidosis Patient's baseline GFR is between 38-45. His baseline creatinine is 2.5. We'll start on sodium bicarbonate tablets and monitor creatinine after placement of nephrostomy tube.    ETOH abuse History of heavy alcohol use. Monitor for signs of withdrawal.Tremulous today, but the patient reports that he only drinks 1 beer per month now, and his wife confirms.    Tobacco abuse  Cessation encouraged.    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 Not candidate for adjuvant chemo per medical oncology. Most recent imaging surveillance 07/2016 w/o gross disease.     Hyperkalemia Was given albuterol and insulin on admission. With these measures and hydration, his potassium has improved to 5.2.    History of MI (myocardial infarction) Continue  aspirin, statin.   Family Communication/Anticipated D/C date and plan/Code Status   DVT prophylaxis: SCDs ordered. Code Status: Full Code.  Family Communication: Wife updated at bedside. Disposition Plan: Home when renal function stabilizes, acidosis improves, and hyperkalemia resolved, likely another 24-48 hours.   Medical Consultants:    Urology   Procedures:    Right percutaneous nephrostomy tube placement 08/11/16  Anti-Infectives:    Ancef 1 preprocedure  Subjective:   The patient reports that he continues to have severe constant, right flank pain. Review of symptoms is positive for poor appetite and negative for nausea/vomiting.  Objective:    Vitals:   08/10/16 2001 08/10/16 2030 08/10/16 2100 08/11/16 0528  BP: 115/73 105/69 104/68 100/67  Pulse: (!) 102 97 93 94  Resp:      Temp: 99.9 F (37.7 C) 99.5 F (37.5 C) 99.9 F (37.7 C) 98.9 F (37.2 C)  TempSrc: Oral Oral Oral Oral  SpO2: 96% 98% 98% 98%  Weight:      Height:        Intake/Output Summary (Last 24 hours) at 08/11/16 0752 Last data filed at 08/10/16 2300  Gross per 24 hour  Intake           938.33 ml  Output             1350 ml  Net          -411.67 ml   Filed Weights   08/10/16 1500  Weight: 75.3 kg (166 lb)    Exam: General exam: Appears uncomfortable, tremulous.Appears older than stated age. Respiratory system: Clear to auscultation. Respiratory effort normal. Cardiovascular system: S1 & S2 heard, RRR. No JVD,  rubs, gallops or clicks. No murmurs. Gastrointestinal system:  Abdomen is nondistended, soft and nontender. No organomegaly or masses felt. Normal bowel sounds heard. Central nervous system: Alert and oriented. No focal neurological deficits. Extremities: No clubbing,  or cyanosis. No edema. Skin: No rashes, lesions or ulcers. Psychiatry: Judgement and insight appear markedly impaired. Mood & affect depressed.   Data Reviewed:   I have personally reviewed following  labs and imaging studies:  Labs: Basic Metabolic Panel:  Recent Labs Lab 08/10/16 0945 08/10/16 1407 08/11/16 0517  NA 134*  --  137  K 6.1*  --  5.2*  CL 112*  --  117*  CO2 15*  --  13*  GLUCOSE 132*  --  138*  BUN 57*  --  55*  CREATININE 3.26*  --  3.44*  CALCIUM 8.7*  --  7.7*  MG  --  1.9  --   PHOS  --  4.4  --    GFR Estimated Creatinine Clearance: 23.1 mL/min (by C-G formula based on SCr of 3.44 mg/dL (H)). Liver Function Tests: No results for input(s): AST, ALT, ALKPHOS, BILITOT, PROT, ALBUMIN in the last 168 hours. No results for input(s): LIPASE, AMYLASE in the last 168 hours. No results for input(s): AMMONIA in the last 168 hours. Coagulation profile  Recent Labs Lab 08/10/16 1250  INR 1.01    CBC:  Recent Labs Lab 08/10/16 0945 08/11/16 0517  WBC 16.7* 13.2*  NEUTROABS 14.2*  --   HGB 12.8* 11.2*  HCT 38.7* 34.0*  MCV 86.6 86.7  PLT 192 144*   Cardiac Enzymes: No results for input(s): CKTOTAL, CKMB, CKMBINDEX, TROPONINI in the last 168 hours. BNP (last 3 results) No results for input(s): PROBNP in the last 8760 hours. CBG:  Recent Labs Lab 08/10/16 1207 08/10/16 1239 08/10/16 1319  GLUCAP 90 161* 120*   D-Dimer: No results for input(s): DDIMER in the last 72 hours. Hgb A1c: No results for input(s): HGBA1C in the last 72 hours. Lipid Profile: No results for input(s): CHOL, HDL, LDLCALC, TRIG, CHOLHDL, LDLDIRECT in the last 72 hours. Thyroid function studies: No results for input(s): TSH, T4TOTAL, T3FREE, THYROIDAB in the last 72 hours.  Invalid input(s): FREET3 Anemia work up: No results for input(s): VITAMINB12, FOLATE, FERRITIN, TIBC, IRON, RETICCTPCT in the last 72 hours. Sepsis Labs:  Recent Labs Lab 08/10/16 0945 08/11/16 0517  WBC 16.7* 13.2*    Microbiology No results found for this or any previous visit (from the past 240 hour(s)).  Radiology: No results found.  Medications:   . aspirin EC  81 mg Oral Daily   . atorvastatin  40 mg Oral q1800  . pantoprazole  40 mg Oral BID  . sodium chloride flush  3 mL Intravenous Q12H   Continuous Infusions: . dextrose 5 % and 0.9% NaCl 100 mL/hr at 08/11/16 0024    Medical decision making is of high complexity and this patient is at high risk of deterioration, therefore this is a level 3 visit.     LOS: 1 day   RAMA,CHRISTINA  [facility] Pager [phone]. If unable to reach me by pager, please call my cell phone at [phone].  *Please refer to [email or web address], password [email or web address] to get updated schedule on who will round on this patient, as hospitalists switch teams weekly. If 7PM-7AM, please contact night-coverage at [email or web address], password [email or web address] for any overnight needs.  08/11/2016, 7:52 AM

## 2016-08-12 LAB — CBC
HCT: 32.5 % — ABNORMAL LOW (ref 39.0–52.0)
HEMOGLOBIN: 10.6 g/dL — AB (ref 13.0–17.0)
MCH: 28.5 pg (ref 26.0–34.0)
MCHC: 32.6 g/dL (ref 30.0–36.0)
MCV: 87.4 fL (ref 78.0–100.0)
Platelets: 149 10*3/uL — ABNORMAL LOW (ref 150–400)
RBC: 3.72 MIL/uL — ABNORMAL LOW (ref 4.22–5.81)
RDW: 17.8 % — AB (ref 11.5–15.5)
WBC: 10.3 10*3/uL (ref 4.0–10.5)

## 2016-08-12 LAB — BASIC METABOLIC PANEL
ANION GAP: 7 (ref 5–15)
BUN: 49 mg/dL — ABNORMAL HIGH (ref 6–20)
CALCIUM: 7.8 mg/dL — AB (ref 8.9–10.3)
CHLORIDE: 115 mmol/L — AB (ref 101–111)
CO2: 16 mmol/L — ABNORMAL LOW (ref 22–32)
CREATININE: 3.57 mg/dL — AB (ref 0.61–1.24)
GFR calc non Af Amer: 17 mL/min — ABNORMAL LOW (ref >60)
GFR, EST AFRICAN AMERICAN: 19 mL/min — AB (ref >60)
Glucose, Bld: 109 mg/dL — ABNORMAL HIGH (ref 65–99)
Potassium: 4.7 mmol/L (ref 3.5–5.1)
SODIUM: 138 mmol/L (ref 135–145)

## 2016-08-12 MED ORDER — POLYETHYLENE GLYCOL 3350 17 G PO PACK
17.0000 g | PACK | Freq: Every day | ORAL | Status: DC | PRN
  Administered 2016-08-14: 17 g via ORAL
  Filled 2016-08-12 (×2): qty 1

## 2016-08-12 MED ORDER — POLYETHYLENE GLYCOL 3350 17 G PO PACK
17.0000 g | PACK | Freq: Once | ORAL | Status: AC
  Administered 2016-08-12: 17 g via ORAL
  Filled 2016-08-12: qty 1

## 2016-08-12 NOTE — Care Management Note (Signed)
Case Management Note  Patient Details  Name: Darren Carr MRN: [id number] Date of Birth: [birth year]  Subjective/Objective:65 y/o m admitted w/hydronephrosis l kidney.POD#2 R PCN.pain control issues,kidney fxn issues. PT cons-await recc.                    Action/Plan:d/c plan home.   Expected Discharge Date:   (unknown)               Expected Discharge Plan:  Home/Self Care  In-House Referral:     Discharge planning Services  CM Consult  Post Acute Care Choice:    Choice offered to:     DME Arranged:    DME Agency:     HH Arranged:    HH Agency:     Status of Service:  In process, will continue to follow  If discussed at Long Length of Stay Meetings, dates discussed:    Additional Comments:  Dessa Phi, RN 08/12/2016, 1:29 PM

## 2016-08-12 NOTE — Progress Notes (Signed)
PT Cancellation Note  Patient Details Name: OLTON PREJEAN MRN: [id number] DOB: 15-Mar-1951   Cancelled Treatment:    Reason Eval/Treat Not Completed: Pain limiting ability to participate. Attempted x 2 this PM but too painful. Will check back tomorrow.    Claretha Cooper 08/12/2016, 5:39 PM

## 2016-08-12 NOTE — Progress Notes (Signed)
Patient ID: Darren Carr, male   DOB: 04-16-1951, 65 y.o.   MRN: [id number]     Referring Physician(s): [name]  Supervising Physician: Jacqulynn Cadet  Patient Status: [facility] - In-pt  Chief Complaint: Right hydronephrosis   Subjective: Patient having some pain around new right sided PCN drain.  States drain is clearing up.  Had left sided PCN exchanged yesterday as per normal routine exchange request.  Allergies: Patient has no known allergies.  Medications: Prior to Admission medications   Medication Sig Start Date End Date Taking? Authorizing Provider  aspirin EC 81 MG EC tablet Take 1 tablet (81 mg total) by mouth daily. 03/09/15  Yes Charolette Forward, MD  atorvastatin (LIPITOR) 40 MG tablet Take 1 tablet (40 mg total) by mouth daily at 6 PM. 03/09/15  Yes Charolette Forward, MD  carvedilol (COREG) 3.125 MG tablet Take 1 tablet (3.125 mg total) by mouth 2 (two) times daily with a meal. 03/09/15  Yes Charolette Forward, MD  clopidogrel (PLAVIX) 75 MG tablet Take 1 tablet (75 mg total) by mouth daily. 03/09/15  Yes Charolette Forward, MD  lisinopril (PRINIVIL,ZESTRIL) 2.5 MG tablet Take 1 tablet (2.5 mg total) by mouth daily. 04/25/15  Yes Charolette Forward, MD  nitroGLYCERIN (NITROSTAT) 0.4 MG SL tablet Place 1 tablet (0.4 mg total) under the tongue every 5 (five) minutes x 3 doses as needed for chest pain. 03/09/15  Yes Charolette Forward, MD  Oxycodone HCl 10 MG TABS Take 10-20 mg by mouth every 4 (four) hours as needed (pain).    Yes Historical Provider, MD  pantoprazole (PROTONIX) 40 MG tablet Take 1 tablet (40 mg total) by mouth 2 (two) times daily. 03/09/15  Yes Charolette Forward, MD  senna-docusate (SENOKOT-S) 8.6-50 MG per tablet Take 1 tablet by mouth 2 (two) times daily. While taking pain meds to prevent constipation Patient not taking: Reported on 08/10/2016 01/22/15   [name], MD    Vital Signs: BP 110/77 (BP Location: Left Arm)   Pulse 79   Temp 97.7 F (36.5 C) (Oral)   Resp 18   Ht 5'  10.5" (1.791 m)   Wt 166 lb (75.3 kg)   SpO2 97%   BMI 23.48 kg/m   Physical Exam: Abd: soft, tender around right-sided flank PCN drain, but no evidence of erythema, infection, or leakage.  Drain with clear yellow urine.  Left-sided drain is present through his urostomy and draining clear yellow urine into a catheter bag.  Imaging: Ir Nephrostomy Tube Change  Result Date: 08/11/2016 INDICATION: Previous cystectomy and ileal conduit with distal ureteral stricture. New moderate left hydronephrosis noted on recent CT EXAM: EXCHANGE OF RETROGRADE NEPHROURETERAL CATHETER UNDER FLUOROSCOPY COMPARISON:  CT 08/10/2016 and previous PROCEDURE: Informed written consent was obtained from the patient after a thorough discussion of the procedural risks, benefits and alternatives. All questions were addressed. Maximal Sterile Barrier Technique was utilized including caps, mask, sterile gowns, sterile gloves, sterile drape, hand hygiene and skin antiseptic. A timeout was performed prior to the initiation of the procedure. Previous catheter was injected opacifying the renal collecting system. Previous catheter as CAD exchanged over a stiff hydrophilic Glidewire for a new 10 [place] multi side hole pigtail catheter, advanced with the pigtail formed centrally in the left renal collecting system, catheter extending through the ileostomy site. The patient tolerated the procedure well. FLUOROSCOPY TIME:  1.1 minute, [id number] uGym2 DAP COMPLICATIONS: None immediate. None immediate IMPRESSION: Technically successful exchange of retrograde left 10 [place] nephroureteral catheter Electronically Signed  By: Lucrezia Europe M.D.   On: 08/11/2016 16:39   Ir Nephrostomy Placement Right  Result Date: 08/11/2016 CLINICAL DATA:  Right-sided hydronephrosis secondary to obstructing proximal ureteral calculus. History of cystectomy for bladder carcinoma with ileal diversion. Right-sided percutaneous nephrostomy now performed. EXAM: 1.  ULTRASOUND GUIDANCE FOR PUNCTURE OF THE RIGHT RENAL COLLECTING SYSTEM. 2. RIGHT PERCUTANEOUS NEPHROSTOMY TUBE PLACEMENT. COMPARISON:  CT of the abdomen and pelvis at [facility] earlier today. ANESTHESIA/SEDATION: 4.0 mg IV Versed; 200 mcg IV Fentanyl. Total Moderate Sedation Time 25 minutes. The patient's level of consciousness and physiologic status were continuously monitored during the procedure by Radiology nursing. CONTRAST:  10 mL Isovue 300 MEDICATIONS: 2 g IV Ancef. Antibiotic was administered in an appropriate time frame prior to skin puncture. FLUOROSCOPY TIME:  1 minutes and 42 seconds.  62 mGy. PROCEDURE: The procedure, risks, benefits, and alternatives were explained to the patient. Questions regarding the procedure were encouraged and answered. The patient understands and consents to the procedure. A time-out was performed prior to the procedure. The right flank region was prepped with chlorhexidine in a sterile fashion, and a sterile drape was applied covering the operative field. A sterile gown and sterile gloves were used for the procedure. Local anesthesia was provided with 1% Lidocaine. Ultrasound was used to localize the right kidney. Under direct ultrasound guidance, a 21 gauge needle was advanced into the renal collecting system. Ultrasound image documentation was performed. Aspiration of urine sample was performed followed by contrast injection. A urine sample was sent for culture analysis. A transitional dilator was advanced over a guidewire. Percutaneous tract dilatation was then performed over the guidewire. A 10 -French percutaneous nephrostomy tube was then advanced and formed in the collecting system. Catheter position was confirmed by fluoroscopy after contrast injection. The catheter was secured at the skin with a Prolene retention suture and Stat-Lock device. A gravity bag was placed. COMPLICATIONS: None. FINDINGS: Ultrasound confirms the presence of significant hydronephrosis.  After access of a posterior calyx, aspiration yielded grossly purulent urine. A sample of urine was sent for culture analysis. A 10 French nephrostomy tube was placed and formed in the renal pelvis. Contrast injection confirms obstructing filling defects in the proximal ureter and some additional probable filling defects in the collecting system and renal pelvis. IMPRESSION: Ten French right percutaneous nephrostomy tube placement. Aspirated urine is grossly purulent and a sample was sent for culture analysis. Multiple filling defects are consistent with renal calculi with obstructing filling defects located in the proximal ureter. Electronically Signed   By: Aletta Edouard M.D.   On: 08/11/2016 10:06    Labs:  CBC:  Recent Labs  08/10/16 0945 08/11/16 0517 08/12/16 0535  WBC 16.7* 13.2* 10.3  HGB 12.8* 11.2* 10.6*  HCT 38.7* 34.0* 32.5*  PLT 192 144* 149*    COAGS:  Recent Labs  08/10/16 1250  INR 1.01    BMP:  Recent Labs  08/10/16 0945 08/11/16 0517 08/12/16 0535  NA 134* 137 138  K 6.1* 5.2* 4.7  CL 112* 117* 115*  CO2 15* 13* 16*  GLUCOSE 132* 138* 109*  BUN 57* 55* 49*  CALCIUM 8.7* 7.7* 7.8*  CREATININE 3.26* 3.44* 3.57*  GFRNONAA 19* 18* 17*  GFRAA 22* 20* 19*    LIVER FUNCTION TESTS: No results for input(s): BILITOT, AST, ALT, ALKPHOS, PROT, ALBUMIN in the last 8760 hours.  Assessment and Plan: 1. Right hydronephrosis, s/p PCN placement on 11/27 2. Chronic left PCN, s/p routine exchange on 11/28 -  right drain initially purulent, now clear yellow urine -CX pending -cont with current drain in place -routine care for left and right PCNs -will follow  Electronically Signed: Kayman Snuffer E 08/12/2016, 12:47 PM   I spent a total of 15 Minutes at the the patient's bedside AND on the patient's hospital floor or unit, greater than 50% of which was counseling/coordinating care for right and left hydronephrosis

## 2016-08-12 NOTE — Progress Notes (Signed)
PROGRESS NOTE  Darren Carr [id number] DOB: [birth year] DOA: 08/10/2016 PCP: [name], DO  HPI/Recap of past 24 hours: Patient is a 65 year old male with past mental history of left-sided hydronephrosis managed by an externalized nephroureteral stent who was admitted on 11/27 after being referred by his urologist for evaluation of severe left-sided abdominal pain and upon admission was found to have a white count of 16.7 and a creatinine of 3.2 (baseline 2.5) plus hyperkalemia. Patient was found to have right-sided hydronephrosis secondary to a right ureteral stone. That same day, seen by interventional radiology and urostomy tube placed.  Today, postop day 2, patient complains of some increased pain on that right side. Not much appetite. Creatinine still elevated at 3.57 although white count has since normalized.    Assessment/Plan: Principal Problem:   Hydronephrosis of left kidney/nephrolithiasis: History of left-sided hydronephrosis managed by externalized stent. Right-sided nephrostomy tube with decompression. Continue to monitor. Active Problems:   ETOH abuse: Patient states that he rarely drinks these days.Marland Kitchen His wife confirms this.   Tobacco abuse: Continue to encourage cessation.    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 Not candidate for adjuvant chemotherapy. Imaging surveillance done earlier this month noted no gross disease.    Hyperkalemia: Secondary to acute renal failure. Resolved. Albuterol and insulin given on admission. Creatinine scratch that potassium now normal.    History of MI (myocardial infarction): On aspirin and statin. Stable.    Acute renal failure (HCC) in the setting of stage III chronic kidney disease: Baseline of 2.5 felt to be secondary to incomplete renal drainage. Now that obstruction resolved with nephrostomy tube, will increase IV fluids and  hopefully this is just a delay in creatinine will continue to improve.     Metabolic acidosis: Resolved. Secondary to uremia on admission   Code Status: Full code   Family Communication: Wife at the bedside  Disposition Plan: discharge once creatinine back to baseline. Likely will be here for several more days.    Consultants  Interventional radiology  Urology   Procedures:  Status post right percutaneous nephrostomy tube placement   Antimicrobials:  IV Ancef times one dose prior to tube placement   DVT prophylaxis:  SCDs   Objective: Vitals:   08/11/16 2207 08/12/16 0027 08/12/16 0528 08/12/16 1247  BP: 111/73  110/77 90/64  Pulse: 65  79 71  Resp: 18  18 16   Temp: (!) 100.6 F (38.1 C) 97.7 F (36.5 C) 97.7 F (36.5 C) 98.2 F (36.8 C)  TempSrc: Oral Oral Oral Oral  SpO2: 97%  97% 96%  Weight:      Height:        Intake/Output Summary (Last 24 hours) at 08/12/16 1322 Last data filed at 08/12/16 1247  Gross per 24 hour  Intake             4840 ml  Output             3150 ml  Net             1690 ml   Filed Weights   08/10/16 1500  Weight: 75.3 kg (166 lb)    Exam:   General:  Alert and oriented 3, no acute distress   Cardiovascular: regular rate and rhythm, S1-S2   Respiratory: clear to auscultation   Abdomen:  ileal conduit tube noted right lower quadrant. Hypoactive bowel sounds  Musculoskeletal: No clubbing or cyanosis or edema  Skin: No skin breaks, tears or lesions   Psychiatry: patient is appropriate, no evidence of psychoses     Data Reviewed: CBC:  Recent Labs Lab 08/10/16 0945 08/11/16 0517 08/12/16 0535  WBC 16.7* 13.2* 10.3  NEUTROABS 14.2*  --   --   HGB 12.8* 11.2* 10.6*  HCT 38.7* 34.0* 32.5*  MCV 86.6 86.7 87.4  PLT 192 144* 123456*   Basic Metabolic Panel:  Recent Labs Lab 08/10/16 0945 08/10/16 1407 08/11/16 0517 08/12/16 0535  NA 134*  --  137 138  K 6.1*  --  5.2* 4.7  CL 112*  --  117* 115*  CO2  15*  --  13* 16*  GLUCOSE 132*  --  138* 109*  BUN 57*  --  55* 49*  CREATININE 3.26*  --  3.44* 3.57*  CALCIUM 8.7*  --  7.7* 7.8*  MG  --  1.9  --   --   PHOS  --  4.4  --   --    GFR: Estimated Creatinine Clearance: 22.2 mL/min (by C-G formula based on SCr of 3.57 mg/dL (H)). Liver Function Tests: No results for input(s): AST, ALT, ALKPHOS, BILITOT, PROT, ALBUMIN in the last 168 hours. No results for input(s): LIPASE, AMYLASE in the last 168 hours. No results for input(s): AMMONIA in the last 168 hours. Coagulation Profile:  Recent Labs Lab 08/10/16 1250  INR 1.01   Cardiac Enzymes: No results for input(s): CKTOTAL, CKMB, CKMBINDEX, TROPONINI in the last 168 hours. BNP (last 3 results) No results for input(s): PROBNP in the last 8760 hours. HbA1C: No results for input(s): HGBA1C in the last 72 hours. CBG:  Recent Labs Lab 08/10/16 1207 08/10/16 1239 08/10/16 1319  GLUCAP 90 161* 120*   Lipid Profile: No results for input(s): CHOL, HDL, LDLCALC, TRIG, CHOLHDL, LDLDIRECT in the last 72 hours. Thyroid Function Tests: No results for input(s): TSH, T4TOTAL, FREET4, T3FREE, THYROIDAB in the last 72 hours. Anemia Panel: No results for input(s): VITAMINB12, FOLATE, FERRITIN, TIBC, IRON, RETICCTPCT in the last 72 hours. Urine analysis:    Component Value Date/Time   COLORURINE YELLOW 02/23/2015 [place] 02/23/2015 2220   LABSPEC 1.014 02/23/2015 2220   PHURINE 7.0 02/23/2015 2220   GLUCOSEU NEGATIVE 02/23/2015 2220   HGBUR LARGE (A) 02/23/2015 2220   BILIRUBINUR NEGATIVE 02/23/2015 2220   KETONESUR NEGATIVE 02/23/2015 2220   PROTEINUR NEGATIVE 02/23/2015 2220   UROBILINOGEN 0.2 02/23/2015 2220   NITRITE NEGATIVE 02/23/2015 2220   LEUKOCYTESUR LARGE (A) 02/23/2015 2220   Sepsis Labs: @LABRCNTIP (procalcitonin:4,lacticidven:4)  ) Recent Results (from the past 240 hour(s))  Urine culture     Status: None (Preliminary result)   Collection Time:  08/10/16  6:00 PM  Result Value Ref Range Status   Specimen Description KIDNEY RIGHT  Final   Special Requests NONE  Final   Culture   Final    CULTURE REINCUBATED FOR BETTER GROWTH Performed at [facility]    Report Status PENDING  Incomplete      Studies: Ir Nephrostomy Tube Change  Result Date: 08/11/2016 INDICATION: Previous cystectomy and ileal conduit with distal ureteral stricture. New moderate left hydronephrosis noted on recent CT EXAM: EXCHANGE OF RETROGRADE NEPHROURETERAL CATHETER UNDER FLUOROSCOPY COMPARISON:  CT 08/10/2016 and previous PROCEDURE: Informed written consent was obtained from the patient after a thorough discussion of the procedural risks, benefits and alternatives. All questions were addressed. Maximal Sterile Barrier Technique was utilized including caps, mask, sterile gowns, sterile gloves,  sterile drape, hand hygiene and skin antiseptic. A timeout was performed prior to the initiation of the procedure. Previous catheter was injected opacifying the renal collecting system. Previous catheter as CAD exchanged over a stiff hydrophilic Glidewire for a new 10 [place] multi side hole pigtail catheter, advanced with the pigtail formed centrally in the left renal collecting system, catheter extending through the ileostomy site. The patient tolerated the procedure well. FLUOROSCOPY TIME:  1.1 minute, [id number] uGym2 DAP COMPLICATIONS: None immediate. None immediate IMPRESSION: Technically successful exchange of retrograde left 10 French nephroureteral catheter Electronically Signed   By: Lucrezia Europe M.D.   On: 08/11/2016 16:39    Scheduled Meds: . aspirin EC  81 mg Oral Daily  . atorvastatin  40 mg Oral q1800  . pantoprazole  40 mg Oral BID  . sodium bicarbonate  1,300 mg Oral TID  . sodium chloride flush  3 mL Intravenous Q12H    Continuous Infusions: . dextrose 5 % and 0.9% NaCl 125 mL/hr at 08/12/16 1026     LOS: 2 days    Annita Brod, MD [facility] Pager [phone]   If 7PM-7AM, please contact night-coverage [email or web address] Password [email or web address] 08/12/2016, 1:22 PM

## 2016-08-12 NOTE — Progress Notes (Signed)
Encouraged patient to sit up in chair. Patient refused and stated he would wait until the morning. This RN explained the importance of mobility. The patient verbalized understanding. Will continue to monitor closely.

## 2016-08-13 ENCOUNTER — Other Ambulatory Visit (HOSPITAL_COMMUNITY): Payer: BLUE CROSS/BLUE SHIELD

## 2016-08-13 LAB — BASIC METABOLIC PANEL
ANION GAP: 6 (ref 5–15)
BUN: 35 mg/dL — AB (ref 6–20)
CALCIUM: 7.6 mg/dL — AB (ref 8.9–10.3)
CO2: 18 mmol/L — ABNORMAL LOW (ref 22–32)
CREATININE: 3.28 mg/dL — AB (ref 0.61–1.24)
Chloride: 113 mmol/L — ABNORMAL HIGH (ref 101–111)
GFR calc Af Amer: 22 mL/min — ABNORMAL LOW (ref >60)
GFR, EST NON AFRICAN AMERICAN: 19 mL/min — AB (ref >60)
GLUCOSE: 119 mg/dL — AB (ref 65–99)
Potassium: 4.2 mmol/L (ref 3.5–5.1)
Sodium: 137 mmol/L (ref 135–145)

## 2016-08-13 MED ORDER — POLYETHYLENE GLYCOL 3350 17 G PO PACK
17.0000 g | PACK | Freq: Once | ORAL | Status: AC
  Administered 2016-08-13: 17 g via ORAL
  Filled 2016-08-13: qty 1

## 2016-08-13 MED ORDER — OXYCODONE HCL 5 MG PO TABS
5.0000 mg | ORAL_TABLET | Freq: Three times a day (TID) | ORAL | Status: DC
  Administered 2016-08-13 – 2016-08-15 (×9): 5 mg via ORAL
  Filled 2016-08-13 (×8): qty 1

## 2016-08-13 MED ORDER — SENNOSIDES-DOCUSATE SODIUM 8.6-50 MG PO TABS
1.0000 | ORAL_TABLET | Freq: Once | ORAL | Status: AC
  Administered 2016-08-13: 1 via ORAL
  Filled 2016-08-13: qty 1

## 2016-08-13 NOTE — Progress Notes (Signed)
Subjective:  1 - Metastatic Bladder Cancer - s/p robotic cystoprostatectomy with ICG sentinel + template pelvic lymphadenectomy and ileal conduit urinary diversion on 01/16/15 for pT3aN1Mx urothelial carcinoma. Not candidate for adjuvant chemo per medical oncology. Most recent imaging surveillance 07/2016 w/o gross disease.   2 - Left Hydronephrosis / Right Renal Atrophy / Stage 3 Renal Insufficiency - had right hydro from bladder cancer obstrucitng Rt UO pre-cystectomy and partial Rt renal atrophy. New Left hydro post-cystectomy and neph tube placed 02/2015 (left Bander stent "fell out" early). Most recent Cr 2-3 and managing with externalized nephro-ureteral stent [id number].  3 - Ileal Conduit - conduit diversion with bricker ureteral anastamoses at cystectomy 01/2015.    4 - Nephrolithiasis / New RIGHT Hydronephrosis - new right ureteral stone with new right hydro by cancer surveillance CT 08/10/16. Also very rapid increase in stone volume on right. Acute Cr rise to 3.2 up from 2.5 and some hyperkalemia to 6.1 on ER labs. Admits to some right flank pain and malaize. Right neph tueb placed and left nephro-ureteral stent exchanged. Hyperkalemia resolved. GFR remains compromised, but non-worsening.   Today "Ulice Dash" is stable. Much less malaise. Afebrile now. UCX from neph tube with scant growth.     Objective: Vital signs in last 24 hours: Temp:  [98.2 F (36.8 C)-98.4 F (36.9 C)] 98.2 F (36.8 C) (11/30 0546) Pulse Rate:  [65-81] 65 (11/30 0546) Resp:  [16] 16 (11/30 0546) BP: (90-114)/(64-74) 105/71 (11/30 0546) SpO2:  [96 %-98 %] 98 % (11/30 0546) Last BM Date:  (prior to admit)  Intake/Output from previous day: 11/29 0701 - 11/30 0700 In: 4037.9 [P.O.:1140; I.V.:2897.9] Out: 3725 [id number] Intake/Output this shift: Total I/O In: 1922.1 [P.O.:420; I.V.:1502.1] Out: 2175 [Urine:2175]  General appearance: alert, cooperative, appears stated age and wife at bedside, pt at  baseline Eyes: negative Nose: Nares normal. Septum midline. Mucosa normal. No drainage or sinus tenderness. Throat: lips, mucosa, and tongue normal; teeth and gums normal Neck: supple, symmetrical, trachea midline Back: symmetric, no curvature. ROM normal. No CVA tenderness., Rt neph tube c/d/i with mostly clear urine.  Resp: non-labored on room air.  Cardio: Nl rate GI: soft, non-tender; bowel sounds normal; no masses,  no organomegaly and RLQ Urostomy pink / patent with distal part of nephroureteral stent in situ.  Male genitalia: normal Extremities: extremities normal, atraumatic, no cyanosis or edema Pulses: 2+ and symmetric Skin: Skin color, texture, turgor normal. No rashes or lesions Neurologic: Grossly normal  Lab Results:   Recent Labs  08/11/16 0517 08/12/16 0535  WBC 13.2* 10.3  HGB 11.2* 10.6*  HCT 34.0* 32.5*  PLT 144* 149*   BMET  Recent Labs  08/11/16 0517 08/12/16 0535  NA 137 138  K 5.2* 4.7  CL 117* 115*  CO2 13* 16*  GLUCOSE 138* 109*  BUN 55* 49*  CREATININE 3.44* 3.57*  CALCIUM 7.7* 7.8*   PT/INR  Recent Labs  08/10/16 1250  LABPROT 13.3  INR 1.01   ABG No results for input(s): PHART, HCO3 in the last 72 hours.  Invalid input(s): PCO2, PO2  Studies/Results: Ir Nephrostomy Tube Change  Result Date: 08/11/2016 INDICATION: Previous cystectomy and ileal conduit with distal ureteral stricture. New moderate left hydronephrosis noted on recent CT EXAM: EXCHANGE OF RETROGRADE NEPHROURETERAL CATHETER UNDER FLUOROSCOPY COMPARISON:  CT 08/10/2016 and previous PROCEDURE: Informed written consent was obtained from the patient after a thorough discussion of the procedural risks, benefits and alternatives. All questions were addressed. Maximal Sterile Barrier Technique was utilized  including caps, mask, sterile gowns, sterile gloves, sterile drape, hand hygiene and skin antiseptic. A timeout was performed prior to the initiation of the procedure.  Previous catheter was injected opacifying the renal collecting system. Previous catheter as CAD exchanged over a stiff hydrophilic Glidewire for a new 10 [place] multi side hole pigtail catheter, advanced with the pigtail formed centrally in the left renal collecting system, catheter extending through the ileostomy site. The patient tolerated the procedure well. FLUOROSCOPY TIME:  1.1 minute, [id number] uGym2 DAP COMPLICATIONS: None immediate. None immediate IMPRESSION: Technically successful exchange of retrograde left 10 French nephroureteral catheter Electronically Signed   By: Lucrezia Europe M.D.   On: 08/11/2016 16:39    Anti-infectives: Anti-infectives    Start     Dose/Rate Route Frequency Ordered Stop   08/10/16 1515  ceFAZolin (ANCEF) IVPB 2g/100 mL premix     2 g 200 mL/hr over 30 Minutes Intravenous To Radiology 08/10/16 1502 08/10/16 1814      Assessment/Plan:  Pt has GU follow up scheduled for 12/5 at our office. Will likely plan for staged right percutaneous stone removal and discuss scheduling at next office visit.  Pt OK for DC from GU perspective with all current tubes / drains as remains afebrile and hyperkalemia resolved.   Again greatly appreciate hospitalist team comangement.   Please call me directly with questions anytime.   [facility], Keela Rubert 08/13/2016

## 2016-08-13 NOTE — Evaluation (Signed)
Physical Therapy Evaluation Patient Details Name: Darren Carr MRN: [id number] DOB: [birth year] Today's Date: 08/13/2016   History of Present Illness  65 yo male s/p R percutaneous nephrostomy 11/27. Hx of ETOH abuse, Mi, bladder ca with mets, chronic back pain, radical cystoprostatectomy.   Clinical Impression  On eval, pt was Min guard assist for mobility. He reports he is still experiencing moderate pain at op site. Pt was only agreeable to pivot to recliner during this session. I would recommend he walk with nursing later today if he will agree to to it. Do not anticipate any follow up PT needs after d/c.     Follow Up Recommendations No PT follow up;Supervision for mobility/OOB    Equipment Recommendations  None recommended by PT    Recommendations for Other Services       Precautions / Restrictions Precautions Precautions: Fall Precaution Comments: drains R side      Mobility  Bed Mobility Overal bed mobility: Needs Assistance Bed Mobility: Supine to Sit     Supine to sit: HOB elevated;Min guard     General bed mobility comments: for safety. Increased time due to pain. Pt relied on bedrail.   Transfers Overall transfer level: Needs assistance   Transfers: Sit to/from Stand;Stand Pivot Transfers Sit to Stand: Min guard Stand pivot transfers: Min guard       General transfer comment: close guard for safety. Pt c/o lightheadedness. He declined ambulation; was only agreeable to stand pivot during session  Ambulation/Gait                Stairs            Wheelchair Mobility    Modified Rankin (Stroke Patients Only)       Balance                                             Pertinent Vitals/Pain Pain Assessment: Faces Pain Score: 5  Faces Pain Scale: Hurts even more Pain Location: 5 at rest, likely 6 with activity at op site Pain Intervention(s): Monitored during session;Repositioned    Home Living Family/patient  expects to be discharged to:: Private residence Living Arrangements: Spouse/significant other   Type of Home: House Home Access: Stairs to enter   [employer] of Steps: 2 Home Layout: One level;Laundry or work area in [place]: Environmental consultant - 2 wheels;Bedside commode      Prior Function Level of Independence: Independent               Hand Dominance        Extremity/Trunk Assessment   Upper Extremity Assessment: Overall WFL for tasks assessed           Lower Extremity Assessment: Overall WFL for tasks assessed      Cervical / Trunk Assessment: Normal  Communication   Communication: No difficulties  Cognition Arousal/Alertness: Awake/alert Behavior During Therapy: WFL for tasks assessed/performed Overall Cognitive Status: Within Functional Limits for tasks assessed                      General Comments      Exercises     Assessment/Plan    PT Assessment Patient needs continued PT services  PT Problem List Decreased mobility;Pain;Decreased activity tolerance          PT Treatment Interventions Therapeutic activities;Gait training;Therapeutic exercise;Patient/family education;Functional mobility  training    PT Goals (Current goals can be found in the Care Plan section)  Acute Rehab PT Goals Patient Stated Goal: less pain PT Goal Formulation: With patient/family Time For Goal Achievement: 08/27/16 Potential to Achieve Goals: Good    Frequency Min 2X/week   Barriers to discharge        Co-evaluation               End of Session   Activity Tolerance: Patient limited by pain Patient left: in chair;with call bell/phone within reach;with family/visitor present           Time: [id number] PT Time Calculation (min) (ACUTE ONLY): 14 min   Charges:   PT Evaluation $PT Eval Low Complexity: 1 Procedure     PT G Codes:        Weston Anna, MPT Pager: [phone]

## 2016-08-13 NOTE — Progress Notes (Signed)
PROGRESS NOTE  Darren Carr [id number] DOB: 1951-08-30 DOA: 08/10/2016 PCP: [name], DO  HPI/Recap of past 24 hours: Patient is a 65 year old male with past mental history of left-sided hydronephrosis managed by an externalized nephroureteral stent who was admitted on 11/27 after being referred by his urologist for evaluation of severe left-sided abdominal pain and upon admission was found to have a white count of 16.7 and a creatinine of 3.2 (baseline 2.5) plus hyperkalemia. Patient was found to have right-sided hydronephrosis secondary to a right ureteral stone. That same day, seen by interventional radiology and urostomy tube placed.  Patient continues to complain of complains of some increased pain on that right side with any kind of movement. Not much appetite. White count normalized. Patient's creatinine which had peaked at 3.57 on 11/29 has started to trend downwards and is today at 3.28.    Assessment/Plan: Principal Problem:   Hydronephrosis of left kidney/nephrolithiasis: History of left-sided hydronephrosis managed by externalized stent. Right-sided nephrostomy tube with decompression. Continue to monitor. Active Problems:   ETOH abuse: Patient states that he rarely drinks these days.Marland Kitchen His wife confirms this.   Tobacco abuse: Continue to encourage cessation.    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 Not candidate for adjuvant chemotherapy. Imaging surveillance done earlier this month noted no gross disease.    Hyperkalemia: Secondary to acute renal failure. Resolved. Albuterol and insulin given on admission. Potassium now normalized    History of MI (myocardial infarction): On aspirin and statin. Stable.    Acute renal failure (HCC) in the setting of stage III chronic kidney disease: Baseline of 2.5 felt to be secondary to incomplete renal drainage. Now that  obstruction resolved with nephrostomy tube, will increase IV fluids and this should continue to improve    Metabolic acidosis: Resolved. Secondary to uremia on admission   Code Status: Full code   Family Communication: Wife at the bedside  Disposition Plan: discharge once creatinine back to baseline. Possibly tomorrow or Saturday    Consultants  Interventional radiology  Urology   Procedures:  Status post right percutaneous nephrostomy tube placement   Antimicrobials:  IV Ancef times one dose prior to tube placement   DVT prophylaxis:  SCDs   Objective: Vitals:   08/12/16 0528 08/12/16 1247 08/12/16 2122 08/13/16 0546  BP: 110/77 90/64 114/74 105/71  Pulse: 79 71 81 65  Resp: 18 16 16 16   Temp: 97.7 F (36.5 C) 98.2 F (36.8 C) 98.4 F (36.9 C) 98.2 F (36.8 C)  TempSrc: Oral Oral Oral Oral  SpO2: 97% 96% 96% 98%  Weight:      Height:        Intake/Output Summary (Last 24 hours) at 08/13/16 1203 Last data filed at 08/13/16 1032  Gross per 24 hour  Intake          3154.25 ml  Output             3725 ml  Net          -570.75 ml   Filed Weights   08/10/16 1500  Weight: 75.3 kg (166 lb)    Exam: Little change from previous day  General:  Alert and oriented 3, no acute distress   Cardiovascular: regular rate and rhythm, S1-S2   Respiratory: clear to auscultation   Abdomen:  ileal conduit tube noted right lower quadrant. Hypoactive bowel sounds  Musculoskeletal: No clubbing or cyanosis or edema  Skin: No  skin breaks, tears or lesions   Psychiatry: patient is appropriate, no evidence of psychoses     Data Reviewed: CT Abd/Pelvis without contrast done 11/27: No evidence of recurrent or metastatic carcinoma within the abdomen or pelvis.  New moderate left hydronephrosis, with ureteral stent in appropriate position. Although increased left nephrolithiasis is noted, no obstructing etiology is visualized by CT. Ureteral stent  malfunction cannot be excluded.  New moderate right hydronephrosis due to several calculi near the right ureteropelvic junction, largest measuring 7 mm. Increased right intrarenal calculi also noted. Electronically Signed By: Earle Gell M.D.   CBC:  Recent Labs Lab 08/10/16 0945 08/11/16 0517 08/12/16 0535  WBC 16.7* 13.2* 10.3  NEUTROABS 14.2*  --   --   HGB 12.8* 11.2* 10.6*  HCT 38.7* 34.0* 32.5*  MCV 86.6 86.7 87.4  PLT 192 144* 123456*   Basic Metabolic Panel:  Recent Labs Lab 08/10/16 0945 08/10/16 1407 08/11/16 0517 08/12/16 0535 08/13/16 0906  NA 134*  --  137 138 137  K 6.1*  --  5.2* 4.7 4.2  CL 112*  --  117* 115* 113*  CO2 15*  --  13* 16* 18*  GLUCOSE 132*  --  138* 109* 119*  BUN 57*  --  55* 49* 35*  CREATININE 3.26*  --  3.44* 3.57* 3.28*  CALCIUM 8.7*  --  7.7* 7.8* 7.6*  MG  --  1.9  --   --   --   PHOS  --  4.4  --   --   --    GFR: Estimated Creatinine Clearance: 24.2 mL/min (by C-G formula based on SCr of 3.28 mg/dL (H)). Liver Function Tests: No results for input(s): AST, ALT, ALKPHOS, BILITOT, PROT, ALBUMIN in the last 168 hours. No results for input(s): LIPASE, AMYLASE in the last 168 hours. No results for input(s): AMMONIA in the last 168 hours. Coagulation Profile:  Recent Labs Lab 08/10/16 1250  INR 1.01   Cardiac Enzymes: No results for input(s): CKTOTAL, CKMB, CKMBINDEX, TROPONINI in the last 168 hours. BNP (last 3 results) No results for input(s): PROBNP in the last 8760 hours. HbA1C: No results for input(s): HGBA1C in the last 72 hours. CBG:  Recent Labs Lab 08/10/16 1207 08/10/16 1239 08/10/16 1319  GLUCAP 90 161* 120*   Lipid Profile: No results for input(s): CHOL, HDL, LDLCALC, TRIG, CHOLHDL, LDLDIRECT in the last 72 hours. Thyroid Function Tests: No results for input(s): TSH, T4TOTAL, FREET4, T3FREE, THYROIDAB in the last 72 hours. Anemia Panel: No results for input(s): VITAMINB12, FOLATE, FERRITIN, TIBC,  IRON, RETICCTPCT in the last 72 hours. Urine analysis:    Component Value Date/Time   COLORURINE YELLOW 02/23/2015 [place] 02/23/2015 2220   LABSPEC 1.014 02/23/2015 2220   PHURINE 7.0 02/23/2015 2220   GLUCOSEU NEGATIVE 02/23/2015 2220   HGBUR LARGE (A) 02/23/2015 2220   BILIRUBINUR NEGATIVE 02/23/2015 2220   KETONESUR NEGATIVE 02/23/2015 2220   PROTEINUR NEGATIVE 02/23/2015 2220   UROBILINOGEN 0.2 02/23/2015 2220   NITRITE NEGATIVE 02/23/2015 2220   LEUKOCYTESUR LARGE (A) 02/23/2015 2220   Sepsis Labs: @LABRCNTIP (procalcitonin:4,lacticidven:4)  ) Recent Results (from the past 240 hour(s))  Urine culture     Status: Abnormal (Preliminary result)   Collection Time: 08/10/16  6:00 PM  Result Value Ref Range Status   Specimen Description KIDNEY RIGHT  Final   Special Requests NONE  Final   Culture >=100,000 COLONIES/mL PROVIDENCIA RETTGERI (A)  Final   Report Status PENDING  Incomplete      Studies: No results found.  Scheduled Meds: . aspirin EC  81 mg Oral Daily  . atorvastatin  40 mg Oral q1800  . oxyCODONE  5 mg Oral TID WC & HS  . pantoprazole  40 mg Oral BID  . sodium bicarbonate  1,300 mg Oral TID  . sodium chloride flush  3 mL Intravenous Q12H    Continuous Infusions: . dextrose 5 % and 0.9% NaCl 125 mL/hr at 08/13/16 0601     LOS: 3 days    Annita Brod, MD [facility] Pager [phone]   If 7PM-7AM, please contact night-coverage [email or web address] Password [email or web address] 08/13/2016, 12:03 PM

## 2016-08-14 LAB — BASIC METABOLIC PANEL
Anion gap: 7 (ref 5–15)
BUN: 29 mg/dL — AB (ref 6–20)
CO2: 19 mmol/L — ABNORMAL LOW (ref 22–32)
CREATININE: 3.05 mg/dL — AB (ref 0.61–1.24)
Calcium: 7.3 mg/dL — ABNORMAL LOW (ref 8.9–10.3)
Chloride: 110 mmol/L (ref 101–111)
GFR calc Af Amer: 24 mL/min — ABNORMAL LOW (ref >60)
GFR, EST NON AFRICAN AMERICAN: 20 mL/min — AB (ref >60)
Glucose, Bld: 110 mg/dL — ABNORMAL HIGH (ref 65–99)
Potassium: 4 mmol/L (ref 3.5–5.1)
SODIUM: 136 mmol/L (ref 135–145)

## 2016-08-14 MED ORDER — FLEET ENEMA 7-19 GM/118ML RE ENEM
1.0000 | ENEMA | Freq: Once | RECTAL | Status: DC
  Filled 2016-08-14: qty 1

## 2016-08-14 MED ORDER — AMOXICILLIN-POT CLAVULANATE 500-125 MG PO TABS
1.0000 | ORAL_TABLET | Freq: Two times a day (BID) | ORAL | Status: DC
  Administered 2016-08-14 – 2016-08-15 (×3): 500 mg via ORAL
  Filled 2016-08-14 (×3): qty 1

## 2016-08-14 NOTE — Progress Notes (Signed)
Patient currently refusing enema.  Patient states he does not feel constipated, is passing gas, and has not been eating much this week.  Educated patient re: pain medication, ambulation, drinking plenty of fluid, and effects on BMs.  Patient refusing ambulation so far today.  Have administered patient's PRN miralax and will continue to monitor.

## 2016-08-14 NOTE — Progress Notes (Signed)
PROGRESS NOTE  Darren Carr [id number] DOB: June 26, 1951 DOA: 08/10/2016 PCP: [name], DO  HPI/Recap of past 24 hours: Patient is a 65 year old male with past mental history of left-sided hydronephrosis managed by an externalized nephroureteral stent who was admitted on 11/27 after being referred by his urologist for evaluation of severe left-sided abdominal pain and upon admission was found to have a white count of 16.7 and a creatinine of 3.2 (baseline 2.5) plus hyperkalemia. Patient was found to have right-sided hydronephrosis secondary to a right ureteral stone. That same day, seen by interventional radiology and urostomy tube placed.  Patient appetite continues to improve. Less pain in his right flank once we added some scheduled OxyIR to his regimen. White count normalized. Patient's creatinine which had peaked at 3.57 on 11/29 and has since continued to trend downward. Today at 3.0.    Assessment/Plan: Principal Problem:   Hydronephrosis of left kidney/nephrolithiasis: History of left-sided hydronephrosis managed by externalized stent. Right-sided nephrostomy tube with decompression. Continue to monitor. Active Problems:   ETOH abuse: Patient states that he rarely drinks these days.Marland Kitchen His wife confirms this.   Tobacco abuse: Continue to encourage cessation.    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 Not candidate for adjuvant chemotherapy. Imaging surveillance done earlier this month noted no gross disease.    Hyperkalemia: Secondary to acute renal failure. Resolved. Albuterol and insulin given on admission. Potassium now normalized    History of MI (myocardial infarction): On aspirin and statin. Stable.    Acute renal failure (HCC) in the setting of stage III chronic kidney disease: Improving. Baseline of 2.5 felt to be secondary to incomplete renal drainage. Now that  obstruction resolved with nephrostomy tube, will increase IV fluids and this should continue to improve     Metabolic acidosis: Resolved. Secondary to uremia on admission   Code Status: Full code   Family Communication: Wife at the bedside  Disposition Plan: Discharge once creatinine near baseline. Anticipate discharge tomorrow    Consultants  Interventional radiology  Urology   Procedures:  Status post right percutaneous nephrostomy tube placement   Antimicrobials:  IV Ancef times one dose prior to tube placement   DVT prophylaxis:  SCDs   Objective: Vitals:   08/13/16 0546 08/13/16 1416 08/13/16 2218 08/14/16 0520  BP: 105/71 110/81 112/74 110/84  Pulse: 65 85 77 77  Resp: 16 18 18 18   Temp: 98.2 F (36.8 C) 98.6 F (37 C) 99 F (37.2 C) 98 F (36.7 C)  TempSrc: Oral Oral Oral Oral  SpO2: 98% 96% 95% 93%  Weight:      Height:        Intake/Output Summary (Last 24 hours) at 08/14/16 1342 Last data filed at 08/14/16 1056  Gross per 24 hour  Intake             1875 ml  Output             3550 ml  Net            -1675 ml   Filed Weights   08/10/16 1500  Weight: 75.3 kg (166 lb)    Exam:   General:  Alert and oriented 3, no acute distress   Cardiovascular: regular rate and rhythm, S1-S2   Respiratory: clear to auscultation   Abdomen:  ileal conduit tube noted right lower quadrant. Hypoactive bowel sounds  Musculoskeletal: No clubbing or cyanosis or edema  Back: Evaluated area  around ileostomy tube. No signs of any erythema or infectious drainage  Skin: No skin breaks, tears or lesions   Psychiatry: patient is appropriate, no evidence of psychoses     Data Reviewed: CT Abd/Pelvis without contrast done 11/27: No evidence of recurrent or metastatic carcinoma within the abdomen or pelvis.  New moderate left hydronephrosis, with ureteral stent in appropriate position. Although increased left nephrolithiasis is noted, no obstructing  etiology is visualized by CT. Ureteral stent malfunction cannot be excluded.  New moderate right hydronephrosis due to several calculi near the right ureteropelvic junction, largest measuring 7 mm. Increased right intrarenal calculi also noted. Electronically Signed By: Earle Gell M.D.   CBC:  Recent Labs Lab 08/10/16 0945 08/11/16 0517 08/12/16 0535  WBC 16.7* 13.2* 10.3  NEUTROABS 14.2*  --   --   HGB 12.8* 11.2* 10.6*  HCT 38.7* 34.0* 32.5*  MCV 86.6 86.7 87.4  PLT 192 144* 123456*   Basic Metabolic Panel:  Recent Labs Lab 08/10/16 0945 08/10/16 1407 08/11/16 0517 08/12/16 0535 08/13/16 0906 08/14/16 0454  NA 134*  --  137 138 137 136  K 6.1*  --  5.2* 4.7 4.2 4.0  CL 112*  --  117* 115* 113* 110  CO2 15*  --  13* 16* 18* 19*  GLUCOSE 132*  --  138* 109* 119* 110*  BUN 57*  --  55* 49* 35* 29*  CREATININE 3.26*  --  3.44* 3.57* 3.28* 3.05*  CALCIUM 8.7*  --  7.7* 7.8* 7.6* 7.3*  MG  --  1.9  --   --   --   --   PHOS  --  4.4  --   --   --   --    GFR: Estimated Creatinine Clearance: 26 mL/min (by C-G formula based on SCr of 3.05 mg/dL (H)). Liver Function Tests: No results for input(s): AST, ALT, ALKPHOS, BILITOT, PROT, ALBUMIN in the last 168 hours. No results for input(s): LIPASE, AMYLASE in the last 168 hours. No results for input(s): AMMONIA in the last 168 hours. Coagulation Profile:  Recent Labs Lab 08/10/16 1250  INR 1.01   Cardiac Enzymes: No results for input(s): CKTOTAL, CKMB, CKMBINDEX, TROPONINI in the last 168 hours. BNP (last 3 results) No results for input(s): PROBNP in the last 8760 hours. HbA1C: No results for input(s): HGBA1C in the last 72 hours. CBG:  Recent Labs Lab 08/10/16 1207 08/10/16 1239 08/10/16 1319  GLUCAP 90 161* 120*   Lipid Profile: No results for input(s): CHOL, HDL, LDLCALC, TRIG, CHOLHDL, LDLDIRECT in the last 72 hours. Thyroid Function Tests: No results for input(s): TSH, T4TOTAL, FREET4, T3FREE,  THYROIDAB in the last 72 hours. Anemia Panel: No results for input(s): VITAMINB12, FOLATE, FERRITIN, TIBC, IRON, RETICCTPCT in the last 72 hours. Urine analysis:    Component Value Date/Time   COLORURINE YELLOW 02/23/2015 2220   APPEARANCEUR CLEAR 02/23/2015 2220   LABSPEC 1.014 02/23/2015 2220   PHURINE 7.0 02/23/2015 2220   GLUCOSEU NEGATIVE 02/23/2015 2220   HGBUR LARGE (A) 02/23/2015 2220   BILIRUBINUR NEGATIVE 02/23/2015 2220   KETONESUR NEGATIVE 02/23/2015 2220   PROTEINUR NEGATIVE 02/23/2015 2220   UROBILINOGEN 0.2 02/23/2015 2220   NITRITE NEGATIVE 02/23/2015 2220   LEUKOCYTESUR LARGE (A) 02/23/2015 2220   Sepsis Labs: @LABRCNTIP (procalcitonin:4,lacticidven:4)  ) Recent Results (from the past 240 hour(s))  Urine culture     Status: Abnormal (Preliminary result)   Collection Time: 08/10/16  6:00 PM  Result Value Ref Range Status  Specimen Description KIDNEY RIGHT  Final   Special Requests NONE  Final   Culture (A)  Final    >=100,000 COLONIES/mL PROVIDENCIA RETTGERI >=100,000 COLONIES/mL ESCHERICHIA COLI >=100,000 COLONIES/mL ENTEROCOCCUS FAECALIS    Report Status PENDING  Incomplete   Organism ID, Bacteria PROVIDENCIA RETTGERI (A)  Final   Organism ID, Bacteria ESCHERICHIA COLI (A)  Final   Organism ID, Bacteria ENTEROCOCCUS FAECALIS (A)  Final      Susceptibility   Escherichia coli - MIC*    AMPICILLIN >=32 RESISTANT Resistant     CEFAZOLIN <=4 SENSITIVE Sensitive     CEFEPIME <=1 SENSITIVE Sensitive     CEFTAZIDIME <=1 SENSITIVE Sensitive     CEFTRIAXONE <=1 SENSITIVE Sensitive     CIPROFLOXACIN <=0.25 SENSITIVE Sensitive     GENTAMICIN <=1 SENSITIVE Sensitive     IMIPENEM <=0.25 SENSITIVE Sensitive     TRIMETH/SULFA <=20 SENSITIVE Sensitive     AMPICILLIN/SULBACTAM 4 SENSITIVE Sensitive     PIP/TAZO <=4 SENSITIVE Sensitive     Extended ESBL NEGATIVE Sensitive     * >=100,000 COLONIES/mL ESCHERICHIA COLI   Enterococcus faecalis - MIC*    AMPICILLIN <=2  SENSITIVE Sensitive     VANCOMYCIN 1 SENSITIVE Sensitive     GENTAMICIN SYNERGY SENSITIVE Sensitive     * >=100,000 COLONIES/mL ENTEROCOCCUS FAECALIS   Providencia rettgeri - MIC*    AMPICILLIN <=2 RESISTANT Resistant     CEFAZOLIN >=64 RESISTANT Resistant     CEFEPIME <=1 SENSITIVE Sensitive     CEFTAZIDIME <=1 SENSITIVE Sensitive     CEFTRIAXONE <=1 SENSITIVE Sensitive     CIPROFLOXACIN <=0.25 SENSITIVE Sensitive     GENTAMICIN <=1 SENSITIVE Sensitive     IMIPENEM 1 SENSITIVE Sensitive     TRIMETH/SULFA <=20 SENSITIVE Sensitive     AMPICILLIN/SULBACTAM <=2 SENSITIVE Sensitive     PIP/TAZO <=4 SENSITIVE Sensitive     * >=100,000 COLONIES/mL PROVIDENCIA RETTGERI      Studies: No results found.  Scheduled Meds: . aspirin EC  81 mg Oral Daily  . atorvastatin  40 mg Oral q1800  . oxyCODONE  5 mg Oral TID WC & HS  . pantoprazole  40 mg Oral BID  . sodium bicarbonate  1,300 mg Oral TID  . sodium chloride flush  3 mL Intravenous Q12H  . sodium phosphate  1 enema Rectal Once    Continuous Infusions: . dextrose 5 % and 0.9% NaCl 125 mL/hr at 08/14/16 1315     LOS: 4 days    Annita Brod, MD [facility] Pager [phone]   If 7PM-7AM, please contact night-coverage [email or web address] Password [email or web address] 08/14/2016, 1:42 PM

## 2016-08-14 NOTE — Progress Notes (Signed)
Patient had an 11 beat run of v tach.  Patient VSS at this time, asymptomatic.  Dr. [place] Pink notified.  Will continue to monitor patient.

## 2016-08-14 NOTE — Progress Notes (Signed)
Patient ID: Darren Carr, male   DOB: 03-30-1951, 65 y.o.   MRN: [id number]    Referring Physician(s): [name]  Supervising Physician: [name]  Patient Status: [facility] - In-pt  Chief Complaint: Right hydronephrosis  Subjective: Patient still having some pain around his new right PCN drain.  Otherwise, he is feeling better overall and hoping to go home tomorrow.  Allergies: Patient has no known allergies.  Medications: Prior to Admission medications   Medication Sig Start Date End Date Taking? Authorizing Provider  aspirin EC 81 MG EC tablet Take 1 tablet (81 mg total) by mouth daily. 03/09/15  Yes Charolette Forward, MD  atorvastatin (LIPITOR) 40 MG tablet Take 1 tablet (40 mg total) by mouth daily at 6 PM. 03/09/15  Yes Charolette Forward, MD  carvedilol (COREG) 3.125 MG tablet Take 1 tablet (3.125 mg total) by mouth 2 (two) times daily with a meal. 03/09/15  Yes Charolette Forward, MD  clopidogrel (PLAVIX) 75 MG tablet Take 1 tablet (75 mg total) by mouth daily. 03/09/15  Yes Charolette Forward, MD  lisinopril (PRINIVIL,ZESTRIL) 2.5 MG tablet Take 1 tablet (2.5 mg total) by mouth daily. 04/25/15  Yes Charolette Forward, MD  nitroGLYCERIN (NITROSTAT) 0.4 MG SL tablet Place 1 tablet (0.4 mg total) under the tongue every 5 (five) minutes x 3 doses as needed for chest pain. 03/09/15  Yes Charolette Forward, MD  Oxycodone HCl 10 MG TABS Take 10-20 mg by mouth every 4 (four) hours as needed (pain).    Yes Historical Provider, MD  pantoprazole (PROTONIX) 40 MG tablet Take 1 tablet (40 mg total) by mouth 2 (two) times daily. 03/09/15  Yes Charolette Forward, MD  senna-docusate (SENOKOT-S) 8.6-50 MG per tablet Take 1 tablet by mouth 2 (two) times daily. While taking pain meds to prevent constipation Patient not taking: Reported on 08/10/2016 01/22/15   [name], MD    Vital Signs: BP 110/84 (BP Location: Left Arm)   Pulse 77   Temp 98 F (36.7 C) (Oral)   Resp 18   Ht 5' 10.5" (1.791 m)   Wt 166 lb (75.3 kg)    SpO2 93%   BMI 23.48 kg/m   Physical Exam: Abd: right PCN in place, drain site is c/d/i.  Drain with clear yellow urine. 675cc/24hrs.  Left PCN which is externalized through his ileo-conduit is draining about 2650cc/24hrs.    Imaging: Ir Nephrostomy Tube Change  Result Date: 08/11/2016 INDICATION: Previous cystectomy and ileal conduit with distal ureteral stricture. New moderate left hydronephrosis noted on recent CT EXAM: EXCHANGE OF RETROGRADE NEPHROURETERAL CATHETER UNDER FLUOROSCOPY COMPARISON:  CT 08/10/2016 and previous PROCEDURE: Informed written consent was obtained from the patient after a thorough discussion of the procedural risks, benefits and alternatives. All questions were addressed. Maximal Sterile Barrier Technique was utilized including caps, mask, sterile gowns, sterile gloves, sterile drape, hand hygiene and skin antiseptic. A timeout was performed prior to the initiation of the procedure. Previous catheter was injected opacifying the renal collecting system. Previous catheter as CAD exchanged over a stiff hydrophilic Glidewire for a new 10 [place] multi side hole pigtail catheter, advanced with the pigtail formed centrally in the left renal collecting system, catheter extending through the ileostomy site. The patient tolerated the procedure well. FLUOROSCOPY TIME:  1.1 minute, [id number] uGym2 DAP COMPLICATIONS: None immediate. None immediate IMPRESSION: Technically successful exchange of retrograde left 10 French nephroureteral catheter Electronically Signed   By: Lucrezia Europe M.D.   On: 08/11/2016 16:39   Ir Nephrostomy  Placement Right  Result Date: 08/11/2016 CLINICAL DATA:  Right-sided hydronephrosis secondary to obstructing proximal ureteral calculus. History of cystectomy for bladder carcinoma with ileal diversion. Right-sided percutaneous nephrostomy now performed. EXAM: 1. ULTRASOUND GUIDANCE FOR PUNCTURE OF THE RIGHT RENAL COLLECTING SYSTEM. 2. RIGHT PERCUTANEOUS NEPHROSTOMY  TUBE PLACEMENT. COMPARISON:  CT of the abdomen and pelvis at [facility] earlier today. ANESTHESIA/SEDATION: 4.0 mg IV Versed; 200 mcg IV Fentanyl. Total Moderate Sedation Time 25 minutes. The patient's level of consciousness and physiologic status were continuously monitored during the procedure by Radiology nursing. CONTRAST:  10 mL Isovue 300 MEDICATIONS: 2 g IV Ancef. Antibiotic was administered in an appropriate time frame prior to skin puncture. FLUOROSCOPY TIME:  1 minutes and 42 seconds.  62 mGy. PROCEDURE: The procedure, risks, benefits, and alternatives were explained to the patient. Questions regarding the procedure were encouraged and answered. The patient understands and consents to the procedure. A time-out was performed prior to the procedure. The right flank region was prepped with chlorhexidine in a sterile fashion, and a sterile drape was applied covering the operative field. A sterile gown and sterile gloves were used for the procedure. Local anesthesia was provided with 1% Lidocaine. Ultrasound was used to localize the right kidney. Under direct ultrasound guidance, a 21 gauge needle was advanced into the renal collecting system. Ultrasound image documentation was performed. Aspiration of urine sample was performed followed by contrast injection. A urine sample was sent for culture analysis. A transitional dilator was advanced over a guidewire. Percutaneous tract dilatation was then performed over the guidewire. A 10 -French percutaneous nephrostomy tube was then advanced and formed in the collecting system. Catheter position was confirmed by fluoroscopy after contrast injection. The catheter was secured at the skin with a Prolene retention suture and Stat-Lock device. A gravity bag was placed. COMPLICATIONS: None. FINDINGS: Ultrasound confirms the presence of significant hydronephrosis. After access of a posterior calyx, aspiration yielded grossly purulent urine. A sample of urine was sent  for culture analysis. A 10 French nephrostomy tube was placed and formed in the renal pelvis. Contrast injection confirms obstructing filling defects in the proximal ureter and some additional probable filling defects in the collecting system and renal pelvis. IMPRESSION: Ten French right percutaneous nephrostomy tube placement. Aspirated urine is grossly purulent and a sample was sent for culture analysis. Multiple filling defects are consistent with renal calculi with obstructing filling defects located in the proximal ureter. Electronically Signed   By: Aletta Edouard M.D.   On: 08/11/2016 10:06    Labs:  CBC:  Recent Labs  08/10/16 0945 08/11/16 0517 08/12/16 0535  WBC 16.7* 13.2* 10.3  HGB 12.8* 11.2* 10.6*  HCT 38.7* 34.0* 32.5*  PLT 192 144* 149*    COAGS:  Recent Labs  08/10/16 1250  INR 1.01    BMP:  Recent Labs  08/11/16 0517 08/12/16 0535 08/13/16 0906 08/14/16 0454  NA 137 138 137 136  K 5.2* 4.7 4.2 4.0  CL 117* 115* 113* 110  CO2 13* 16* 18* 19*  GLUCOSE 138* 109* 119* 110*  BUN 55* 49* 35* 29*  CALCIUM 7.7* 7.8* 7.6* 7.3*  CREATININE 3.44* 3.57* 3.28* 3.05*  GFRNONAA 18* 17* 19* 20*  GFRAA 20* 19* 22* 24*    LIVER FUNCTION TESTS: No results for input(s): BILITOT, AST, ALT, ALKPHOS, PROT, ALBUMIN in the last 8760 hours.  Assessment and Plan: 1. New right hydronephrosis, s/p PCN drain placed -cont with PCNs per urology. -routine drain care -can follow up  with [place] as an outpatient for routine drain exchanges per urology.  Electronically Signed: Henreitta Cea 08/14/2016, 9:50 AM   I spent a total of 15 Minutes at the the patient's bedside AND on the patient's hospital floor or unit, greater than 50% of which was counseling/coordinating care for right hydronephrosis

## 2016-08-15 LAB — URINE CULTURE

## 2016-08-15 LAB — BASIC METABOLIC PANEL
Anion gap: 9 (ref 5–15)
BUN: 26 mg/dL — AB (ref 6–20)
CO2: 21 mmol/L — ABNORMAL LOW (ref 22–32)
CREATININE: 3.06 mg/dL — AB (ref 0.61–1.24)
Calcium: 8 mg/dL — ABNORMAL LOW (ref 8.9–10.3)
Chloride: 108 mmol/L (ref 101–111)
GFR calc Af Amer: 23 mL/min — ABNORMAL LOW (ref >60)
GFR, EST NON AFRICAN AMERICAN: 20 mL/min — AB (ref >60)
Glucose, Bld: 114 mg/dL — ABNORMAL HIGH (ref 65–99)
Potassium: 4.4 mmol/L (ref 3.5–5.1)
SODIUM: 138 mmol/L (ref 135–145)

## 2016-08-15 MED ORDER — POLYETHYLENE GLYCOL 3350 17 G PO PACK: 17.0000 g | PACK | Freq: Every day | ORAL | 0 refills | Status: DC | PRN

## 2016-08-15 MED ORDER — AMOXICILLIN-POT CLAVULANATE 500-125 MG PO TABS: 1.0000 | ORAL_TABLET | Freq: Two times a day (BID) | ORAL | 10 refills | Status: DC

## 2016-08-15 NOTE — Discharge Summary (Signed)
Discharge Summary  Darren Carr [id number] DOB: Jun 26, 1951  PCP: [name], DO  Admit date: 08/10/2016 Discharge date: 08/15/2016  Time spent: 25 minutes   Recommendations for Outpatient Follow-up:  1. New medication: Augmentin 500 mg by mouth twice a day 5 days 2. Patient will follow up with urology on 12/4 for stone extraction   Discharge Diagnoses:  Active Hospital Problems   Diagnosis Date Noted  . Hydronephrosis of left kidney   . History of MI (myocardial infarction) 08/11/2016  . Nephrolithiasis 08/11/2016  . Acute renal failure ([place]) 08/11/2016  . Stage III chronic kidney disease 08/11/2016  . Metabolic acidosis 0000000  . Hyperkalemia 08/10/2016  . Bladder cancer ([place]) 01/16/2015  . Tobacco abuse 07/25/2013  . ETOH abuse 07/25/2013  . Dysphagia 07/25/2013    Resolved Hospital Problems   Diagnosis Date Noted Date Resolved  No resolved problems to display.    Discharge Condition: Improved, being discharged home   Diet recommendation: Low-sodium   Vitals:   08/14/16 2252 08/15/16 0540  BP: 114/76 118/75  Pulse: 67 77  Resp: 18 18  Temp: 99.7 F (37.6 C) 99.3 F (37.4 C)    History of present illness:  Patient is a 65 year old male with past mental history of left-sided hydronephrosis managed by an externalized nephroureteral stent who was admitted on 11/27 after being referred by his urologist for evaluation of severe left-sided abdominal pain and upon admission was found to have a white count of 16.7 and a creatinine of 3.2 (baseline around 2.5) plus hyperkalemia. Patient was found to have right-sided hydronephrosis secondary to a right ureteral stone. That same day, seen by interventional radiology and urostomy tube placed.  Hospital Course:  Hydronephrosis of left kidney/nephrolithiasis: History of left-sided hydronephrosis managed by externalized stent. Right-sided nephrostomy tube with decompression. Stable. Patient will follow-up with  urology on Tuesday. Active Problems:   ETOH abuse: Patient states that he rarely drinks these days.Marland Kitchen His wife confirms this.   Tobacco abuse: Continue to encourage cessation.    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 Not candidate for adjuvant chemotherapy. Imaging surveillance done earlier this month noted no gross disease.    Hyperkalemia: Secondary to acute renal failure. Resolved. Albuterol and insulin given on admission. Potassium now normalized    History of MI (myocardial infarction): On aspirin and statin. Stable.    Acute renal failure (H[facility]) in the setting of stage III chronic kidney disease: Improving. Baseline of 2.5 felt to be secondary to incomplete renal drainage. Once obstruction resolved with nephrostomy tube, patient started on IV fluids and creatinine began to trend downward. By 12/2, creatinine down to 3 and patient's pain better controlled. He'll be discharged with follow-up with urology on Tuesday    Metabolic acidosis: Resolved. Secondary to uremia on admission   Consultants  Interventional radiology  Urology   Procedures:  Status post right percutaneous nephrostomy tube placement   Discharge Exam: BP 118/75 (BP Location: Left Arm)   Pulse 77   Temp 99.3 F (37.4 C) (Oral)   Resp 18   Ht 5' 10.5" (1.791 m)   Wt 75.3 kg (166 lb)   SpO2 94%   BMI 23.48 kg/m   General: Alert and oriented 3, no acute distress  Cardiovascular: Regular rate and rhythm, S1-S2  Respiratory: Clear to auscultation bilaterally   Discharge Instructions You were cared for by a hospitalist during your hospital stay. If you have any questions about your discharge  medications or the care you received while you were in the hospital after you are discharged, you can call the unit and asked to speak with the hospitalist on call if the hospitalist that took care of you is not  available. Once you are discharged, your primary care physician will handle any further medical issues. Please note that NO REFILLS for any discharge medications will be authorized once you are discharged, as it is imperative that you return to your primary care physician (or establish a relationship with a primary care physician if you do not have one) for your aftercare needs so that they can reassess your need for medications and monitor your lab values.  Discharge Instructions    Diet - low sodium heart healthy    Complete by:  As directed    Increase activity slowly    Complete by:  As directed        Medication List    TAKE these medications   amoxicillin-clavulanate 500-125 MG tablet Commonly known as:  AUGMENTIN Take 1 tablet (500 mg total) by mouth every 12 (twelve) hours.   aspirin 81 MG EC tablet Take 1 tablet (81 mg total) by mouth daily.   atorvastatin 40 MG tablet Commonly known as:  LIPITOR Take 1 tablet (40 mg total) by mouth daily at 6 PM.   carvedilol 3.125 MG tablet Commonly known as:  COREG Take 1 tablet (3.125 mg total) by mouth 2 (two) times daily with a meal.   clopidogrel 75 MG tablet Commonly known as:  PLAVIX Take 1 tablet (75 mg total) by mouth daily.   lisinopril 2.5 MG tablet Commonly known as:  PRINIVIL,ZESTRIL Take 1 tablet (2.5 mg total) by mouth daily.   nitroGLYCERIN 0.4 MG SL tablet Commonly known as:  NITROSTAT Place 1 tablet (0.4 mg total) under the tongue every 5 (five) minutes x 3 doses as needed for chest pain.   Oxycodone HCl 10 MG Tabs Take 10-20 mg by mouth every 4 (four) hours as needed (pain).   pantoprazole 40 MG tablet Commonly known as:  PROTONIX Take 1 tablet (40 mg total) by mouth 2 (two) times daily.   polyethylene glycol packet Commonly known as:  MIRALAX / GLYCOLAX Take 17 g by mouth daily as needed for mild constipation.   senna-docusate 8.6-50 MG tablet Commonly known as:  Senokot-S Take 1 tablet by mouth 2  (two) times daily. While taking pain meds to prevent constipation      No Known Allergies    The results of significant diagnostics from this hospitalization (including imaging, microbiology, ancillary and laboratory) are listed below for reference.    Significant Diagnostic Studies: Ir Nephrostomy Tube Change  Result Date: 08/11/2016 INDICATION: Previous cystectomy and ileal conduit with distal ureteral stricture. New moderate left hydronephrosis noted on recent CT EXAM: EXCHANGE OF RETROGRADE NEPHROURETERAL CATHETER UNDER FLUOROSCOPY COMPARISON:  CT 08/10/2016 and previous PROCEDURE: Informed written consent was obtained from the patient after a thorough discussion of the procedural risks, benefits and alternatives. All questions were addressed. Maximal Sterile Barrier Technique was utilized including caps, mask, sterile gowns, sterile gloves, sterile drape, hand hygiene and skin antiseptic. A timeout was performed prior to the initiation of the procedure. Previous catheter was injected opacifying the renal collecting system. Previous catheter as CAD exchanged over a stiff hydrophilic Glidewire for a new 10 [place] multi side hole pigtail catheter, advanced with the pigtail formed centrally in the left renal collecting system, catheter extending through the ileostomy site. The patient  tolerated the procedure well. FLUOROSCOPY TIME:  1.1 minute, [id number] uGym2 DAP COMPLICATIONS: None immediate. None immediate IMPRESSION: Technically successful exchange of retrograde left 10 French nephroureteral catheter Electronically Signed   By: Lucrezia Europe M.D.   On: 08/11/2016 16:39   Ir Nephrostomy Placement Right  Result Date: 08/11/2016 CLINICAL DATA:  Right-sided hydronephrosis secondary to obstructing proximal ureteral calculus. History of cystectomy for bladder carcinoma with ileal diversion. Right-sided percutaneous nephrostomy now performed. EXAM: 1. ULTRASOUND GUIDANCE FOR PUNCTURE OF THE RIGHT RENAL  COLLECTING SYSTEM. 2. RIGHT PERCUTANEOUS NEPHROSTOMY TUBE PLACEMENT. COMPARISON:  CT of the abdomen and pelvis at [facility] earlier today. ANESTHESIA/SEDATION: 4.0 mg IV Versed; 200 mcg IV Fentanyl. Total Moderate Sedation Time 25 minutes. The patient's level of consciousness and physiologic status were continuously monitored during the procedure by Radiology nursing. CONTRAST:  10 mL Isovue 300 MEDICATIONS: 2 g IV Ancef. Antibiotic was administered in an appropriate time frame prior to skin puncture. FLUOROSCOPY TIME:  1 minutes and 42 seconds.  62 mGy. PROCEDURE: The procedure, risks, benefits, and alternatives were explained to the patient. Questions regarding the procedure were encouraged and answered. The patient understands and consents to the procedure. A time-out was performed prior to the procedure. The right flank region was prepped with chlorhexidine in a sterile fashion, and a sterile drape was applied covering the operative field. A sterile gown and sterile gloves were used for the procedure. Local anesthesia was provided with 1% Lidocaine. Ultrasound was used to localize the right kidney. Under direct ultrasound guidance, a 21 gauge needle was advanced into the renal collecting system. Ultrasound image documentation was performed. Aspiration of urine sample was performed followed by contrast injection. A urine sample was sent for culture analysis. A transitional dilator was advanced over a guidewire. Percutaneous tract dilatation was then performed over the guidewire. A 10 -French percutaneous nephrostomy tube was then advanced and formed in the collecting system. Catheter position was confirmed by fluoroscopy after contrast injection. The catheter was secured at the skin with a Prolene retention suture and Stat-Lock device. A gravity bag was placed. COMPLICATIONS: None. FINDINGS: Ultrasound confirms the presence of significant hydronephrosis. After access of a posterior Darren, aspiration  yielded grossly purulent urine. A sample of urine was sent for culture analysis. A 10 French nephrostomy tube was placed and formed in the renal pelvis. Contrast injection confirms obstructing filling defects in the proximal ureter and some additional probable filling defects in the collecting system and renal pelvis. IMPRESSION: Ten French right percutaneous nephrostomy tube placement. Aspirated urine is grossly purulent and a sample was sent for culture analysis. Multiple filling defects are consistent with renal calculi with obstructing filling defects located in the proximal ureter. Electronically Signed   By: Aletta Edouard M.D.   On: 08/11/2016 10:06    Microbiology: Recent Results (from the past 240 hour(s))  Urine culture     Status: Abnormal (Preliminary result)   Collection Time: 08/10/16  6:00 PM  Result Value Ref Range Status   Specimen Description KIDNEY RIGHT  Final   Special Requests NONE  Final   Culture (A)  Final    >=100,000 COLONIES/mL PROVIDENCIA RETTGERI >=100,000 COLONIES/mL ESCHERICHIA COLI >=100,000 COLONIES/mL ENTEROCOCCUS FAECALIS    Report Status PENDING  Incomplete   Organism ID, Bacteria PROVIDENCIA RETTGERI (A)  Final   Organism ID, Bacteria ESCHERICHIA COLI (A)  Final   Organism ID, Bacteria ENTEROCOCCUS FAECALIS (A)  Final      Susceptibility   Escherichia coli - MIC*  AMPICILLIN >=32 RESISTANT Resistant     CEFAZOLIN <=4 SENSITIVE Sensitive     CEFEPIME <=1 SENSITIVE Sensitive     CEFTAZIDIME <=1 SENSITIVE Sensitive     CEFTRIAXONE <=1 SENSITIVE Sensitive     CIPROFLOXACIN <=0.25 SENSITIVE Sensitive     GENTAMICIN <=1 SENSITIVE Sensitive     IMIPENEM <=0.25 SENSITIVE Sensitive     TRIMETH/SULFA <=20 SENSITIVE Sensitive     AMPICILLIN/SULBACTAM 4 SENSITIVE Sensitive     PIP/TAZO <=4 SENSITIVE Sensitive     Extended ESBL NEGATIVE Sensitive     * >=100,000 COLONIES/mL ESCHERICHIA COLI   Enterococcus faecalis - MIC*    AMPICILLIN <=2 SENSITIVE  Sensitive     VANCOMYCIN 1 SENSITIVE Sensitive     GENTAMICIN SYNERGY SENSITIVE Sensitive     * >=100,000 COLONIES/mL ENTEROCOCCUS FAECALIS   Providencia rettgeri - MIC*    AMPICILLIN <=2 RESISTANT Resistant     CEFAZOLIN >=64 RESISTANT Resistant     CEFEPIME <=1 SENSITIVE Sensitive     CEFTAZIDIME <=1 SENSITIVE Sensitive     CEFTRIAXONE <=1 SENSITIVE Sensitive     CIPROFLOXACIN <=0.25 SENSITIVE Sensitive     GENTAMICIN <=1 SENSITIVE Sensitive     IMIPENEM 1 SENSITIVE Sensitive     TRIMETH/SULFA <=20 SENSITIVE Sensitive     AMPICILLIN/SULBACTAM <=2 SENSITIVE Sensitive     PIP/TAZO <=4 SENSITIVE Sensitive     * >=100,000 COLONIES/mL PROVIDENCIA RETTGERI     Labs: Basic Metabolic Panel:  Recent Labs Lab 08/10/16 1407 08/11/16 0517 08/12/16 0535 08/13/16 0906 08/14/16 0454 08/15/16 0544  NA  --  137 138 137 136 138  K  --  5.2* 4.7 4.2 4.0 4.4  CL  --  117* 115* 113* 110 108  CO2  --  13* 16* 18* 19* 21*  GLUCOSE  --  138* 109* 119* 110* 114*  BUN  --  55* 49* 35* 29* 26*  CREATININE  --  3.44* 3.57* 3.28* 3.05* 3.06*  CALCIUM  --  7.7* 7.8* 7.6* 7.3* 8.0*  MG 1.9  --   --   --   --   --   PHOS 4.4  --   --   --   --   --    Liver Function Tests: No results for input(s): AST, ALT, ALKPHOS, BILITOT, PROT, ALBUMIN in the last 168 hours. No results for input(s): LIPASE, AMYLASE in the last 168 hours. No results for input(s): AMMONIA in the last 168 hours. CBC:  Recent Labs Lab 08/10/16 0945 08/11/16 0517 08/12/16 0535  WBC 16.7* 13.2* 10.3  NEUTROABS 14.2*  --   --   HGB 12.8* 11.2* 10.6*  HCT 38.7* 34.0* 32.5*  MCV 86.6 86.7 87.4  PLT 192 144* 149*   Cardiac Enzymes: No results for input(s): CKTOTAL, CKMB, CKMBINDEX, TROPONINI in the last 168 hours. BNP: BNP (last 3 results) No results for input(s): BNP in the last 8760 hours.  ProBNP (last 3 results) No results for input(s): PROBNP in the last 8760 hours.  CBG:  Recent Labs Lab 08/10/16 1207  08/10/16 1239 08/10/16 1319  GLUCAP 90 161* 120*       Signed:  Annita Brod, MD [facility] 08/15/2016, 1:30 PM

## 2016-08-19 ENCOUNTER — Inpatient Hospital Stay (HOSPITAL_COMMUNITY): Payer: BLUE CROSS/BLUE SHIELD

## 2016-08-19 ENCOUNTER — Encounter (HOSPITAL_COMMUNITY)
Admission: EM | Disposition: A | Discharge status: Home / Self Care | Payer: Self-pay | Source: Ambulatory Visit | Attending: Cardiology

## 2016-08-19 ENCOUNTER — Inpatient Hospital Stay (HOSPITAL_COMMUNITY)
Admission: EM | Admit: 2016-08-19 | Discharge: 2016-08-21 | DRG: 313 | Disposition: A | Discharge status: Home / Self Care | Payer: BLUE CROSS/BLUE SHIELD | Source: Ambulatory Visit | Attending: Cardiology | Admitting: Cardiology

## 2016-08-19 ENCOUNTER — Encounter (HOSPITAL_COMMUNITY): Payer: Self-pay | Admitting: Emergency Medicine

## 2016-08-19 DIAGNOSIS — Z87442 Personal history of urinary calculi: Secondary | ICD-10-CM | POA: Diagnosis not present

## 2016-08-19 DIAGNOSIS — Z955 Presence of coronary angioplasty implant and graft: Secondary | ICD-10-CM | POA: Diagnosis not present

## 2016-08-19 DIAGNOSIS — R0789 Other chest pain: Principal | ICD-10-CM | POA: Diagnosis present

## 2016-08-19 DIAGNOSIS — N184 Chronic kidney disease, stage 4 (severe): Secondary | ICD-10-CM | POA: Diagnosis present

## 2016-08-19 DIAGNOSIS — Z7982 Long term (current) use of aspirin: Secondary | ICD-10-CM | POA: Diagnosis not present

## 2016-08-19 DIAGNOSIS — I509 Heart failure, unspecified: Secondary | ICD-10-CM

## 2016-08-19 DIAGNOSIS — I2510 Atherosclerotic heart disease of native coronary artery without angina pectoris: Secondary | ICD-10-CM | POA: Diagnosis present

## 2016-08-19 DIAGNOSIS — K219 Gastro-esophageal reflux disease without esophagitis: Secondary | ICD-10-CM | POA: Diagnosis present

## 2016-08-19 DIAGNOSIS — F17210 Nicotine dependence, cigarettes, uncomplicated: Secondary | ICD-10-CM | POA: Diagnosis present

## 2016-08-19 DIAGNOSIS — Z79899 Other long term (current) drug therapy: Secondary | ICD-10-CM | POA: Diagnosis not present

## 2016-08-19 DIAGNOSIS — D638 Anemia in other chronic diseases classified elsewhere: Secondary | ICD-10-CM | POA: Diagnosis present

## 2016-08-19 DIAGNOSIS — Z8551 Personal history of malignant neoplasm of bladder: Secondary | ICD-10-CM

## 2016-08-19 DIAGNOSIS — I252 Old myocardial infarction: Secondary | ICD-10-CM | POA: Diagnosis not present

## 2016-08-19 DIAGNOSIS — I129 Hypertensive chronic kidney disease with stage 1 through stage 4 chronic kidney disease, or unspecified chronic kidney disease: Secondary | ICD-10-CM | POA: Diagnosis present

## 2016-08-19 DIAGNOSIS — Z936 Other artificial openings of urinary tract status: Secondary | ICD-10-CM | POA: Diagnosis not present

## 2016-08-19 DIAGNOSIS — Z9079 Acquired absence of other genital organ(s): Secondary | ICD-10-CM | POA: Diagnosis not present

## 2016-08-19 DIAGNOSIS — I4891 Unspecified atrial fibrillation: Secondary | ICD-10-CM | POA: Diagnosis not present

## 2016-08-19 DIAGNOSIS — Z7902 Long term (current) use of antithrombotics/antiplatelets: Secondary | ICD-10-CM

## 2016-08-19 DIAGNOSIS — R079 Chest pain, unspecified: Secondary | ICD-10-CM | POA: Diagnosis present

## 2016-08-19 DIAGNOSIS — E785 Hyperlipidemia, unspecified: Secondary | ICD-10-CM | POA: Diagnosis present

## 2016-08-19 LAB — COMPREHENSIVE METABOLIC PANEL
ALK PHOS: 120 U/L (ref 38–126)
ALT: 19 U/L (ref 17–63)
ANION GAP: 11 (ref 5–15)
AST: 15 U/L (ref 15–41)
Albumin: 2.5 g/dL — ABNORMAL LOW (ref 3.5–5.0)
BILIRUBIN TOTAL: 0.7 mg/dL (ref 0.3–1.2)
BUN: 31 mg/dL — AB (ref 6–20)
CO2: 20 mmol/L — ABNORMAL LOW (ref 22–32)
CREATININE: 2.73 mg/dL — AB (ref 0.61–1.24)
Calcium: 8.4 mg/dL — ABNORMAL LOW (ref 8.9–10.3)
Chloride: 103 mmol/L (ref 101–111)
GFR calc non Af Amer: 23 mL/min — ABNORMAL LOW (ref >60)
GFR, EST AFRICAN AMERICAN: 27 mL/min — AB (ref >60)
Glucose, Bld: 129 mg/dL — ABNORMAL HIGH (ref 65–99)
Potassium: 4.3 mmol/L (ref 3.5–5.1)
Sodium: 134 mmol/L — ABNORMAL LOW (ref 135–145)
Total Protein: 7.7 g/dL (ref 6.5–8.1)

## 2016-08-19 LAB — CBC WITH DIFFERENTIAL/PLATELET
BASOS PCT: 0 %
BASOS PCT: 0 %
Basophils Absolute: 0 10*3/uL (ref 0.0–0.1)
Basophils Absolute: 0 10*3/uL (ref 0.0–0.1)
Eosinophils Absolute: 0.1 10*3/uL (ref 0.0–0.7)
Eosinophils Absolute: 0.1 10*3/uL (ref 0.0–0.7)
Eosinophils Relative: 1 %
Eosinophils Relative: 1 %
HEMATOCRIT: 33.5 % — AB (ref 39.0–52.0)
HEMATOCRIT: 33.9 % — AB (ref 39.0–52.0)
HEMOGLOBIN: 11.5 g/dL — AB (ref 13.0–17.0)
Hemoglobin: 11 g/dL — ABNORMAL LOW (ref 13.0–17.0)
LYMPHS ABS: 1.5 10*3/uL (ref 0.7–4.0)
LYMPHS PCT: 8 %
LYMPHS PCT: 11 %
Lymphs Abs: 1.2 10*3/uL (ref 0.7–4.0)
MCH: 27.7 pg (ref 26.0–34.0)
MCH: 28.6 pg (ref 26.0–34.0)
MCHC: 32.8 g/dL (ref 30.0–36.0)
MCHC: 33.9 g/dL (ref 30.0–36.0)
MCV: 84.4 fL (ref 78.0–100.0)
MCV: 84.3 fL (ref 78.0–100.0)
MONO ABS: 1.8 10*3/uL — AB (ref 0.1–1.0)
MONO ABS: 1.4 10*3/uL — AB (ref 0.1–1.0)
MONOS PCT: 12 %
MONOS PCT: 10 %
NEUTROS ABS: 11.5 10*3/uL — AB (ref 1.7–7.7)
NEUTROS ABS: 10.7 10*3/uL — AB (ref 1.7–7.7)
NEUTROS PCT: 78 %
Neutrophils Relative %: 79 %
Platelets: 331 10*3/uL (ref 150–400)
Platelets: 365 10*3/uL (ref 150–400)
RBC: 3.97 MIL/uL — ABNORMAL LOW (ref 4.22–5.81)
RBC: 4.02 MIL/uL — ABNORMAL LOW (ref 4.22–5.81)
RDW: 16.2 % — AB (ref 11.5–15.5)
RDW: 16.3 % — AB (ref 11.5–15.5)
WBC: 14.6 10*3/uL — ABNORMAL HIGH (ref 4.0–10.5)
WBC: 13.7 10*3/uL — ABNORMAL HIGH (ref 4.0–10.5)

## 2016-08-19 LAB — ECHOCARDIOGRAM COMPLETE
HEIGHTINCHES: 66 in
Weight: 2656 oz

## 2016-08-19 LAB — BASIC METABOLIC PANEL
ANION GAP: 8 (ref 5–15)
BUN: 32 mg/dL — ABNORMAL HIGH (ref 6–20)
CALCIUM: 8.4 mg/dL — AB (ref 8.9–10.3)
CO2: 21 mmol/L — AB (ref 22–32)
Chloride: 105 mmol/L (ref 101–111)
Creatinine, Ser: 2.79 mg/dL — ABNORMAL HIGH (ref 0.61–1.24)
GFR calc Af Amer: 26 mL/min — ABNORMAL LOW (ref >60)
GFR calc non Af Amer: 23 mL/min — ABNORMAL LOW (ref >60)
GLUCOSE: 111 mg/dL — AB (ref 65–99)
Potassium: 4.6 mmol/L (ref 3.5–5.1)
Sodium: 134 mmol/L — ABNORMAL LOW (ref 135–145)

## 2016-08-19 LAB — MAGNESIUM: MAGNESIUM: 2 mg/dL (ref 1.7–2.4)

## 2016-08-19 LAB — TSH: TSH: 0.423 u[IU]/mL (ref 0.350–4.500)

## 2016-08-19 LAB — TROPONIN I
Troponin I: <0.03 ng/mL (ref <0.03)
Troponin I: <0.03 ng/mL (ref <0.03)

## 2016-08-19 SURGERY — INVASIVE LAB ABORTED CASE

## 2016-08-19 MED ORDER — ONDANSETRON HCL 4 MG/2ML IJ SOLN: 4.0000 mg | Freq: Four times a day (QID) | INTRAMUSCULAR | Status: DC | PRN

## 2016-08-19 MED ORDER — ASPIRIN EC 81 MG PO TBEC
81.0000 mg | DELAYED_RELEASE_TABLET | Freq: Every day | ORAL | Status: DC
  Administered 2016-08-20 – 2016-08-21 (×2): 81 mg via ORAL
  Filled 2016-08-19 (×3): qty 1

## 2016-08-19 MED ORDER — NITROGLYCERIN 0.4 MG SL SUBL: 0.4000 mg | SUBLINGUAL_TABLET | SUBLINGUAL | Status: DC | PRN

## 2016-08-19 MED ORDER — FENTANYL CITRATE (PF) 100 MCG/2ML IJ SOLN
50.0000 ug | Freq: Once | INTRAMUSCULAR | Status: AC
  Administered 2016-08-19: 50 ug via INTRAVENOUS
  Filled 2016-08-19: qty 2

## 2016-08-19 MED ORDER — AMOXICILLIN-POT CLAVULANATE 500-125 MG PO TABS
1.0000 | ORAL_TABLET | Freq: Two times a day (BID) | ORAL | Status: DC
  Administered 2016-08-19 (×2): 500 mg via ORAL
  Filled 2016-08-19 (×3): qty 1

## 2016-08-19 MED ORDER — POLYETHYLENE GLYCOL 3350 17 G PO PACK: 17.0000 g | PACK | Freq: Every day | ORAL | Status: DC | PRN

## 2016-08-19 MED ORDER — ATORVASTATIN CALCIUM 40 MG PO TABS
40.0000 mg | ORAL_TABLET | Freq: Every day | ORAL | Status: DC
  Administered 2016-08-19 – 2016-08-20 (×2): 40 mg via ORAL
  Filled 2016-08-19 (×2): qty 1

## 2016-08-19 MED ORDER — HEPARIN (PORCINE) IN NACL 2-0.9 UNIT/ML-% IJ SOLN
INTRAMUSCULAR | Status: AC
  Filled 2016-08-19: qty 1000

## 2016-08-19 MED ORDER — PANTOPRAZOLE SODIUM 40 MG PO TBEC
40.0000 mg | DELAYED_RELEASE_TABLET | Freq: Two times a day (BID) | ORAL | Status: DC
  Administered 2016-08-19 – 2016-08-21 (×5): 40 mg via ORAL
  Filled 2016-08-19 (×5): qty 1

## 2016-08-19 MED ORDER — MORPHINE SULFATE (PF) 2 MG/ML IV SOLN
2.0000 mg | Freq: Four times a day (QID) | INTRAVENOUS | Status: DC | PRN
  Administered 2016-08-19 – 2016-08-20 (×5): 2 mg via INTRAVENOUS
  Filled 2016-08-19 (×5): qty 1

## 2016-08-19 MED ORDER — ACETAMINOPHEN 325 MG PO TABS: 650.0000 mg | ORAL_TABLET | ORAL | Status: DC | PRN

## 2016-08-19 MED ORDER — HEPARIN SODIUM (PORCINE) 5000 UNIT/ML IJ SOLN
5000.0000 [IU] | Freq: Three times a day (TID) | INTRAMUSCULAR | Status: DC
  Administered 2016-08-19 – 2016-08-20 (×3): 5000 [IU] via SUBCUTANEOUS
  Filled 2016-08-19 (×3): qty 1

## 2016-08-19 MED ORDER — HEPARIN (PORCINE) IN NACL 2-0.9 UNIT/ML-% IJ SOLN
INTRAMUSCULAR | Status: DC | PRN
  Administered 2016-08-19: 1000 mL

## 2016-08-19 MED ORDER — CLOPIDOGREL BISULFATE 75 MG PO TABS
75.0000 mg | ORAL_TABLET | Freq: Every day | ORAL | Status: DC
  Administered 2016-08-19 – 2016-08-20 (×2): 75 mg via ORAL
  Filled 2016-08-19 (×2): qty 1

## 2016-08-19 MED ORDER — LIDOCAINE HCL (PF) 1 % IJ SOLN
INTRAMUSCULAR | Status: AC
  Filled 2016-08-19: qty 30

## 2016-08-19 MED ORDER — SODIUM CHLORIDE 0.9 % IV SOLN
INTRAVENOUS | Status: DC
  Administered 2016-08-19 – 2016-08-20 (×3): via INTRAVENOUS

## 2016-08-19 MED ORDER — CARVEDILOL 3.125 MG PO TABS
3.1250 mg | ORAL_TABLET | Freq: Two times a day (BID) | ORAL | Status: DC
  Administered 2016-08-19 – 2016-08-20 (×2): 3.125 mg via ORAL
  Filled 2016-08-19 (×2): qty 1

## 2016-08-19 MED ORDER — SENNOSIDES-DOCUSATE SODIUM 8.6-50 MG PO TABS: 1.0000 | ORAL_TABLET | Freq: Two times a day (BID) | ORAL | Status: DC

## 2016-08-19 MED ORDER — NITROGLYCERIN 1 MG/10 ML FOR IR/CATH LAB
INTRA_ARTERIAL | Status: AC
  Filled 2016-08-19: qty 10

## 2016-08-19 SURGICAL SUPPLY — 11 items
GLIDESHEATH SLEND SS 6F .021 (SHEATH) IMPLANT
GUIDEWIRE INQWIRE 1.5J.035X260 (WIRE) IMPLANT
INQWIRE 1.5J .035X260CM (WIRE)
KIT ENCORE 26 ADVANTAGE (KITS) IMPLANT
KIT HEART LEFT (KITS) ×1 IMPLANT
PACK CARDIAC CATHETERIZATION (CUSTOM PROCEDURE TRAY) ×1 IMPLANT
SHEATH PINNACLE 6F 10CM (SHEATH) IMPLANT
SYR MEDRAD MARK V 150ML (SYRINGE) ×1 IMPLANT
TRANSDUCER W/STOPCOCK (MISCELLANEOUS) ×1 IMPLANT
TUBING CIL FLEX 10 FLL-RA (TUBING) ×1 IMPLANT
WIRE EMERALD 3MM-J .035X150CM (WIRE) IMPLANT

## 2016-08-19 NOTE — H&P (Signed)
Darren Carr is an 65 y.o. male.   Chief Complaint: Chest pain HPI: Patient is 65 year old male with past medical history significant for coronary artery disease history of anterolateral wall myocardial infarction in the past, multivessel CAD, hypertension, hyperlipidemia, history of EtOH abuse, tobacco abuse, history of bladder cancer status post radical cystoprostatectomy with bilateral pelvic lymphadenopathy and ileal loop conduit urinary dilation history of right hydronephrosis recently placed nephrostomy tube history of GI bleed in the past, came to the ER by EMS escorts STEMI was called. Patient complained of initially back pain which started last night followed by left-sided chest pain off and on since last night chest pain increased with deep breathing. Denies any history of anginal chest pain. Denies shortness of breath. EKG done on the field showed normal sinus rhythm with Q waves in septal leads with diffuse ST elevation and minimal PR depression. Code STEMI was canceled.  Past Medical History:  Diagnosis Date  . Acute MI, lateral wall ([place]) 02/23/2015  . Alcoholic ([place])   . Anterior wall myocardial infarction ([place]) 02/23/2015  . Bladder cancer ([place]) 07/2014  . Chronic back pain   . Coronary artery disease   . GERD (gastroesophageal reflux disease)   . History of blood transfusion    "related to bleeding ulcers on my esophagus"  . Hypertension   . Kidney stones   . Upper GI bleed     Past Surgical History:  Procedure Laterality Date  . APPENDECTOMY    . BALLOON DILATION N/A 08/16/2013   Procedure: BALLOON DILATION to 16.5mm;  Surgeon: Rogene Houston, MD;  Location: [facility];  Service: Endoscopy;  Laterality: N/A;  . BIOPSY  08/16/2013   Procedure: DISTAL ESOPHAGEAL BIOPSIES;  Surgeon: Rogene Houston, MD;  Location: [facility];  Service: Endoscopy;;  . CARDIAC CATHETERIZATION N/A 02/23/2015   Procedure: Left Heart Cath and Coronary Angiography;  Surgeon: Charolette Forward, MD;  Location: [facility];  Service: Cardiovascular;  Laterality: N/A;  . CARDIAC CATHETERIZATION N/A 04/25/2015   Procedure: Coronary Stent Intervention;  Surgeon: Charolette Forward, MD;  Location: [facility];  Service: Cardiovascular;  Laterality: N/A;  . CORONARY ANGIOPLASTY    . CYSTOSCOPY W/ URETERAL STENT PLACEMENT Bilateral 12/05/2014   Procedure: CYSTOSCOPY WITH BILATERAL RETROGRADE PYELOGRAM;  Surgeon: [name], MD;  [facility];  Service: Urology;  Laterality: Bilateral;  . CYSTOSCOPY WITH INJECTION N/A 01/16/2015   Procedure: CYSTOSCOPY WITH INJECTION OF INDOCYANINE GREEN DYE;  Surgeon: [name], MD;  [facility];  Service: Urology;  Laterality: N/A;  . ESOPHAGOGASTRODUODENOSCOPY N/A 02/24/2015   Procedure: ESOPHAGOGASTRODUODENOSCOPY (EGD);  Surgeon: Wilford Corner, MD;  [facility] ENDOSCOPY;  Service: Endoscopy;  Laterality: N/A;  . ESOPHAGOGASTRODUODENOSCOPY (EGD) WITH PROPOFOL N/A 08/16/2013   Procedure: ESOPHAGOGASTRODUODENOSCOPY (EGD) WITH PROPOFOL Hiatus at 40cm Gastroesophageal Junction at 37cm;  Surgeon: Rogene Houston, MD;  Location: [facility];  Service: Endoscopy;  Laterality: N/A;  . IR GENERIC HISTORICAL  04/29/2016   IR CATHETER TUBE CHANGE 04/29/2016 [facility]  . IR GENERIC HISTORICAL  06/10/2016   IR CATHETER TUBE CHANGE 06/10/2016 Jacqulynn Cadet, MD [facility]  . IR GENERIC HISTORICAL  07/15/2016   IR CATHETER TUBE CHANGE 07/15/2016 Marybelle Killings, MD [facility]  . IR GENERIC HISTORICAL  08/10/2016   IR NEPHROSTOMY PLACEMENT RIGHT 08/10/2016 Aletta Edouard, MD [facility]  . IR GENERIC HISTORICAL  08/11/2016   IR NEPHROSTOMY TUBE CHANGE 08/11/2016 Arne Cleveland, MD [facility]  . LYMPHADENECTOMY Bilateral 01/16/2015  Procedure: PELVIC LYMPH NODE DISSECTION;  Surgeon: [name], MD;  [facility];  Service: Urology;  Laterality: Bilateral;  . ROBOT ASSISTED LAPAROSCOPIC COMPLETE CYSTECT ILEAL CONDUIT N/A 01/16/2015   Procedure: ROBOTIC  ASSISTED LAPAROSCOPIC COMPLETE CYSTECT ILEAL CONDUIT/ROBOTIC ASSISTED LAPAROSCOPIC RADICAL PROSTATECTOMY;  Surgeon: [name], MD;  [facility];  Service: Urology;  Laterality: N/A;  . TRANSURETHRAL RESECTION OF BLADDER TUMOR WITH GYRUS (TURBT-GYRUS) N/A 12/05/2014   Procedure: TRANSURETHRAL RESECTION OF BLADDER TUMOR WITH GYRUS (TURBT-GYRUS);  Surgeon: [name], MD;  [facility];  Service: Urology;  Laterality: N/A;  . TRANSURETHRAL RESECTION OF PROSTATE  07-2014,08-2014,09-2014    No family history on file. Social History:  reports that he has been smoking Cigarettes.  He has a 73.50 pack-year smoking history. He has never used smokeless tobacco. He reports that he drinks about 25.2 oz of alcohol per week . He reports that he does not use drugs.  Allergies: No Known Allergies  Medications Prior to Admission  Medication Sig Dispense Refill  . amoxicillin-clavulanate (AUGMENTIN) 500-125 MG tablet Take 1 tablet (500 mg total) by mouth every 12 (twelve) hours. 5 tablet 10  . aspirin EC 81 MG EC tablet Take 1 tablet (81 mg total) by mouth daily. 30 tablet 3  . atorvastatin (LIPITOR) 40 MG tablet Take 1 tablet (40 mg total) by mouth daily at 6 PM. 30 tablet 3  . carvedilol (COREG) 3.125 MG tablet Take 1 tablet (3.125 mg total) by mouth 2 (two) times daily with a meal. 60 tablet 3  . clopidogrel (PLAVIX) 75 MG tablet Take 1 tablet (75 mg total) by mouth daily. 30 tablet 3  . lisinopril (PRINIVIL,ZESTRIL) 2.5 MG tablet Take 1 tablet (2.5 mg total) by mouth daily. 30 tablet 3  . nitroGLYCERIN (NITROSTAT) 0.4 MG SL tablet Place 1 tablet (0.4 mg total) under the tongue every 5 (five) minutes x 3 doses as needed for chest pain. 25 tablet 12  . Oxycodone HCl 10 MG TABS Take 10-20 mg by mouth every 4 (four) hours as needed (pain).     . pantoprazole (PROTONIX) 40 MG tablet Take 1 tablet (40 mg total) by mouth 2 (two) times daily. 60 tablet 3  . polyethylene glycol (MIRALAX / GLYCOLAX)  packet Take 17 g by mouth daily as needed for mild constipation. 14 each 0  . senna-docusate (SENOKOT-S) 8.6-50 MG per tablet Take 1 tablet by mouth 2 (two) times daily. While taking pain meds to prevent constipation (Patient not taking: Reported on 08/10/2016) 30 tablet 0    No results found for this or any previous visit (from the past 48 hour(s)). No results found.  Review of Systems  Constitutional: Negative for chills and fever.  Eyes: Negative for blurred vision.  Respiratory: Negative for shortness of breath.   Cardiovascular: Positive for chest pain. Negative for palpitations, orthopnea and leg swelling.  Gastrointestinal: Positive for nausea. Negative for abdominal pain and vomiting.  Genitourinary: Negative for dysuria.  Neurological: Negative for dizziness.    Blood pressure 111/82, pulse (!) 0, resp. rate (!) 26, SpO2 (!) 0 %. Physical Exam  Constitutional: He is oriented to person, place, and time.  HENT:  Head: Normocephalic and atraumatic.  Eyes: Conjunctivae are normal. Pupils are equal, round, and reactive to light.  Neck: Normal range of motion. Neck supple. No JVD present. No tracheal deviation present. No thyromegaly present.  Cardiovascular: Normal rate and regular rhythm.  Exam reveals no friction rub.   Murmur (Soft systolic murmur noted  no S3 gallop) heard. Respiratory:  Clear anterolaterally  GI: Bowel sounds are normal. He exhibits no distension. There is no tenderness. There is no rebound.  Musculoskeletal: He exhibits no edema, tenderness or deformity.  Neurological: He is alert and oriented to person, place, and time.     Assessment/Plan Atypical chest pain rule out pericarditis doubt MI Multivessel coronary artery disease History of anterolateral wall MI in the past Hypertension Hyperlipidemia Chronic kidney disease stage IV Anemia of chronic disease EtOH abuse Tobacco abuse History of high-grade metastatic bladder cancer as above History of  bilateral hydronephrosis History of kidney stone History of recent right urostomy tube Plan As per orders   Charolette Forward, MD 08/19/2016, 7:59 AM

## 2016-08-19 NOTE — ED Triage Notes (Signed)
Pt in from home via [facility] with chest pain earlier this morning. Pt was given 2 tabs NTG by EMS. Pt arrived to ED, went straight to cath lab for STEMI. STEMI canceled by Dr. Terrence Dupont when pt got there. Pt sent down to ED for chest pain. Pt currently stating 5/10 L sided cp. Cath lab gave Heparin. Denies sob, diaphoresis or n/v. A&Ox4

## 2016-08-19 NOTE — ED Notes (Signed)
Patient transported to X-ray 

## 2016-08-19 NOTE — ED Provider Notes (Signed)
[facility] DEPT Provider Note   CSN: [id number] Arrival date & time: 08/19/16  1761     History   Chief Complaint No chief complaint on file.   HPI STERLING MONDO is a 65 y.o. male.  65yo M w/ extensive PMH including CAD s/p MI, CKD, kidney stones, bladder cancer, GERD who p/w chest pain. Pt was awakened from sleep from left-sided chest pain that is constant, severe, and unrelieved by nitroglycerin 2 by EMS. He denies any associated shortness of breath, nausea, or vomiting. Pain is worse when he takes a deep breath. He states he was having some chest pain yesterday radiating to his back, no back pain currently. He was picked up by EMS today and a STEMI alert was called. He was taken to the Cath Lab where he was met by Dr. Terrence Dupont, who compared EKG to previous and canceled STEMI. ASA 323m taken this morning.  Of note, the patient was hospitalized recently related to kidney stone and has percutaneous nephrostomy tube currently on the right.   The history is provided by the patient.    Past Medical History:  Diagnosis Date  . Acute [place], [place] wall ([place] 02/23/2015  . Alcoholic ([place]   . [place] 02/23/2015  . [place] ([place] 07[place]2014  . Chronic back pain   . Coronary artery disease   . GERD (gastroesophageal reflux disease)   . History of blood transfusion    "related to bleeding ulcers on my esophagus"  . Hypertension   . Kidney stones   . Upper GI bleed     Patient Active Problem List   Diagnosis Date Noted  . History of MI (myocardial infarction) 08/11/2016  . Nephrolithiasis 08/11/2016  . Acute renal failure ([place] 08/11/2016  . Stage III chronic kidney disease 08/11/2016  . Metabolic acidosis 160/73/7106 . Hyperkalemia 08/10/2016  . Hydronephrosis of left kidney   . Bladder cancer ([place] 01/16/2015  . ETOH abuse 07/25/2013  . Tobacco abuse 07/25/2013  . Dysphagia 07/25/2013    Past Surgical History:  Procedure Laterality  Date  . APPENDECTOMY    . BALLOON DILATION N/A 08/16/2013   Procedure: BALLOON DILATION to 16.561m  Surgeon: NaRogene HoustonMD;  [facility];  Service: Endoscopy;  Laterality: N/A;  . BIOPSY  08/16/2013   Procedure: DISTAL ESOPHAGEAL BIOPSIES;  Surgeon: NaRogene HoustonMD;  Location: [facility];  Service: Endoscopy;;  . CARDIAC CATHETERIZATION N/A 02/23/2015   Procedure: Left Heart Cath and Coronary Angiography;  Surgeon: MoCharolette ForwardMD;  [facility];  Service: Cardiovascular;  Laterality: N/A;  . CARDIAC CATHETERIZATION N/A 04/25/2015   Procedure: Coronary Stent Intervention;  Surgeon: MoCharolette ForwardMD;  [facility];  Service: Cardiovascular;  Laterality: N/A;  . CORONARY ANGIOPLASTY    . CYSTOSCOPY W/ URETERAL STENT PLACEMENT Bilateral 12/05/2014   Procedure: CYSTOSCOPY WITH BILATERAL RETROGRADE PYELOGRAM;  Surgeon: ThAlexis FrockMD;  [facility];  Service: Urology;  Laterality: Bilateral;  . CYSTOSCOPY WITH INJECTION N/A 01/16/2015   Procedure: CYSTOSCOPY WITH INJECTION OF INDOCYANINE GREEN DYE;  Surgeon: ThAlexis FrockMD;  [facility];  Service: Urology;  Laterality: N/A;  . ESOPHAGOGASTRODUODENOSCOPY N/A 02/24/2015   Procedure: ESOPHAGOGASTRODUODENOSCOPY (EGD);  Surgeon: ViWilford CornerMD;  [facility];  Service: Endoscopy;  Laterality: N/A;  . ESOPHAGOGASTRODUODENOSCOPY (EGD) WITH PROPOFOL N/A 08/16/2013   Procedure: ESOPHAGOGASTRODUODENOSCOPY (EGD) WITH PROPOFOL Hiatus at 40cm Gastroesophageal Junction at 37cm;  Surgeon: NaRogene HoustonMD;  Location[facility];  Service: Endoscopy;  Laterality: N/A;  . IR GENERIC HISTORICAL  04/29/2016   IR CATHETER TUBE CHANGE 04/29/2016 [facility]  . IR GENERIC HISTORICAL  06/10/2016   IR CATHETER TUBE CHANGE 06/10/2016 Jacqulynn Cadet, MD [facility]  . IR GENERIC HISTORICAL  07/15/2016   IR CATHETER TUBE CHANGE 07/15/2016 Marybelle Killings, MD [facility]  . IR GENERIC HISTORICAL  08/10/2016     IR NEPHROSTOMY PLACEMENT RIGHT 08/10/2016 Aletta Edouard, MD [facility]  . IR GENERIC HISTORICAL  08/11/2016   IR NEPHROSTOMY TUBE CHANGE 08/11/2016 Arne Cleveland, MD [facility]  . LYMPHADENECTOMY Bilateral 01/16/2015   Procedure: PELVIC LYMPH NODE DISSECTION;  Surgeon: [name], MD;  [facility];  Service: Urology;  Laterality: Bilateral;  . ROBOT ASSISTED LAPAROSCOPIC COMPLETE CYSTECT ILEAL CONDUIT N/A 01/16/2015   Procedure: ROBOTIC ASSISTED LAPAROSCOPIC COMPLETE CYSTECT ILEAL CONDUIT/ROBOTIC ASSISTED LAPAROSCOPIC RADICAL PROSTATECTOMY;  Surgeon: [name], MD;  [facility];  Service: Urology;  Laterality: N/A;  . TRANSURETHRAL RESECTION OF BLADDER TUMOR WITH GYRUS (TURBT-GYRUS) N/A 12/05/2014   Procedure: TRANSURETHRAL RESECTION OF BLADDER TUMOR WITH GYRUS (TURBT-GYRUS);  Surgeon: [name], MD;  [facility];  Service: Urology;  Laterality: N/A;  . TRANSURETHRAL RESECTION OF PROSTATE  07-2014,08-2014,09-2014       Home Medications    Prior to Admission medications   Medication Sig Start Date End Date Taking? Authorizing Provider  amoxicillin-clavulanate (AUGMENTIN) 500-125 MG tablet Take 1 tablet (500 mg total) by mouth every 12 (twelve) hours. 08/15/16   Annita Brod, MD  aspirin EC 81 MG EC tablet Take 1 tablet (81 mg total) by mouth daily. 03/09/15   Charolette Forward, MD  atorvastatin (LIPITOR) 40 MG tablet Take 1 tablet (40 mg total) by mouth daily at 6 PM. 03/09/15   Charolette Forward, MD  carvedilol (COREG) 3.125 MG tablet Take 1 tablet (3.125 mg total) by mouth 2 (two) times daily with a meal. 03/09/15   Charolette Forward, MD  clopidogrel (PLAVIX) 75 MG tablet Take 1 tablet (75 mg total) by mouth daily. 03/09/15   Charolette Forward, MD  lisinopril (PRINIVIL,ZESTRIL) 2.5 MG tablet Take 1 tablet (2.5 mg total) by mouth daily. 04/25/15   Charolette Forward, MD  nitroGLYCERIN (NITROSTAT) 0.4 MG SL tablet Place 1 tablet (0.4 mg total) under the tongue every 5 (five)  minutes x 3 doses as needed for chest pain. 03/09/15   Charolette Forward, MD  Oxycodone HCl 10 MG TABS Take 10-20 mg by mouth every 4 (four) hours as needed (pain).     Historical Provider, MD  pantoprazole (PROTONIX) 40 MG tablet Take 1 tablet (40 mg total) by mouth 2 (two) times daily. 03/09/15   Charolette Forward, MD  polyethylene glycol (MIRALAX / GLYCOLAX) packet Take 17 g by mouth daily as needed for mild constipation. 08/15/16   Annita Brod, MD  senna-docusate (SENOKOT-S) 8.6-50 MG per tablet Take 1 tablet by mouth 2 (two) times daily. While taking pain meds to prevent constipation Patient not taking: Reported on 08/10/2016 01/22/15   [name], MD    Family History No family history on file.  Social History Social History  Substance Use Topics  . Smoking status: Current Every Day Smoker    Packs/day: 1.50    Years: 49.00    Types: Cigarettes  . Smokeless tobacco: Never Used  . Alcohol use 25.2 oz/week    42 Cans of beer per week     Comment: 04/24/2015 "4-8, 12oz beers/day"  Allergies   Patient has no known allergies.   Review of Systems Review of Systems 10 Systems reviewed and are negative for acute change except as noted in the HPI.   Physical Exam Updated Vital Signs BP 111/82   Pulse (!) 0   Resp (!) 26   SpO2 (!) 0%   Physical Exam  Constitutional: He is oriented to person, place, and time. He appears well-developed and well-nourished. He appears distressed.  Appears older than stated age, in mild distress due to pain  HENT:  Head: Normocephalic and atraumatic.  Moist mucous membranes  Eyes: Conjunctivae are normal. Pupils are equal, round, and reactive to light.  Neck: Neck supple.  Cardiovascular: Normal rate, regular rhythm and normal heart sounds.   No murmur heard. Pulmonary/Chest: Effort normal and breath sounds normal.  Abdominal: Soft. Bowel sounds are normal. He exhibits no distension. There is no tenderness.  Urostomy draining yellow  urine; R nephrostomy draining yellow-orange urine  Musculoskeletal: He exhibits no edema.  Neurological: He is alert and oriented to person, place, and time.  Fluent speech  Skin: Skin is warm and dry.  Psychiatric:  distressed  Nursing note and vitals reviewed.    ED Treatments / Results  Labs (all labs ordered are listed, but only abnormal results are displayed) Labs Reviewed - No data to display  EKG  EKG Interpretation None       Radiology No results found.  Procedures Procedures (including critical care time)  Medications Ordered in ED Medications - No data to display   Initial Impression / Assessment and Plan / ED Course  I have reviewed the triage vital signs and the nursing notes.  Pertinent labs & imaging results that were available during my care of the patient were reviewed by me and considered in my medical decision making (see chart for details).  Clinical Course    Pt w/ h/o CAD p/w chest pain Beginning this morning, STEMI alert initially called by EMS and patient taken to cath lab but canceled and patient brought back to the ED. He has already been seen by Dr. Terrence Dupont who will admit for further workup. VS stable, EKG similar to previous. I have ordered fentanyl for pain as well as basic lab work including CBC, BMP, and troponin. I have also ordered chest x-ray. I considered PE but patient has known CKD and will be unable to have contrast. I will defer this workup to Dr. Terrence Dupont. Pt admitted for further evaluation.   Final Clinical Impressions(s) / ED Diagnoses   Final diagnoses:  None    New Prescriptions New Prescriptions   No medications on file     Sharlett Iles, MD 08/19/16 [phone]

## 2016-08-19 NOTE — Progress Notes (Signed)
   08/19/16 0651  Clinical Encounter Type  Visited With Patient  Visit Type Code;ED  Pt. Code stemi, straight to cath lab.  Chaplain followed patient up to Cath Lab, advised nurse at front desk to call when family  Or spouse is present , if needed.  [employer] [phone]

## 2016-08-19 NOTE — ED Notes (Signed)
Pt returns from radiology placed back on monitor. 

## 2016-08-20 ENCOUNTER — Encounter (HOSPITAL_COMMUNITY): Payer: Self-pay | Admitting: General Practice

## 2016-08-20 LAB — BASIC METABOLIC PANEL
Anion gap: 10 (ref 5–15)
BUN: 34 mg/dL — AB (ref 6–20)
CHLORIDE: 107 mmol/L (ref 101–111)
CO2: 18 mmol/L — AB (ref 22–32)
CREATININE: 2.65 mg/dL — AB (ref 0.61–1.24)
Calcium: 7.9 mg/dL — ABNORMAL LOW (ref 8.9–10.3)
GFR calc Af Amer: 28 mL/min — ABNORMAL LOW (ref >60)
GFR calc non Af Amer: 24 mL/min — ABNORMAL LOW (ref >60)
GLUCOSE: 100 mg/dL — AB (ref 65–99)
POTASSIUM: 3.9 mmol/L (ref 3.5–5.1)
SODIUM: 135 mmol/L (ref 135–145)

## 2016-08-20 LAB — LIPID PANEL
CHOL/HDL RATIO: 5.5 ratio
CHOLESTEROL: 122 mg/dL (ref 0–200)
HDL: 22 mg/dL — ABNORMAL LOW (ref >40)
LDL CALC: 84 mg/dL (ref 0–99)
Triglycerides: 81 mg/dL (ref <150)
VLDL: 16 mg/dL (ref 0–40)

## 2016-08-20 LAB — CBC
HCT: 31.1 % — ABNORMAL LOW (ref 39.0–52.0)
HEMOGLOBIN: 10.8 g/dL — AB (ref 13.0–17.0)
MCH: 29.2 pg (ref 26.0–34.0)
MCHC: 34.7 g/dL (ref 30.0–36.0)
MCV: 84.1 fL (ref 78.0–100.0)
PLATELETS: 339 10*3/uL (ref 150–400)
RBC: 3.7 MIL/uL — AB (ref 4.22–5.81)
RDW: 16.2 % — ABNORMAL HIGH (ref 11.5–15.5)
WBC: 11.1 10*3/uL — ABNORMAL HIGH (ref 4.0–10.5)

## 2016-08-20 LAB — TROPONIN I

## 2016-08-20 LAB — HEPARIN LEVEL (UNFRACTIONATED): Heparin Unfractionated: 0.11 IU/mL — ABNORMAL LOW (ref 0.30–0.70)

## 2016-08-20 MED ORDER — ENSURE ENLIVE PO LIQD: 237.0000 mL | Freq: Two times a day (BID) | ORAL | Status: DC

## 2016-08-20 MED ORDER — HYDROCODONE-ACETAMINOPHEN 5-325 MG PO TABS
1.0000 | ORAL_TABLET | Freq: Four times a day (QID) | ORAL | Status: DC | PRN
  Administered 2016-08-20 (×2): 1 via ORAL
  Filled 2016-08-20 (×2): qty 1

## 2016-08-20 MED ORDER — AMIODARONE HCL IN DEXTROSE 360-4.14 MG/200ML-% IV SOLN
60.0000 mg/h | INTRAVENOUS | Status: AC
  Administered 2016-08-20 (×2): 60 mg/h via INTRAVENOUS
  Filled 2016-08-20 (×2): qty 200

## 2016-08-20 MED ORDER — AMIODARONE HCL IN DEXTROSE 360-4.14 MG/200ML-% IV SOLN
30.0000 mg/h | INTRAVENOUS | Status: DC
  Administered 2016-08-20 (×2): 30 mg/h via INTRAVENOUS
  Filled 2016-08-20 (×2): qty 200

## 2016-08-20 MED ORDER — SODIUM CHLORIDE 0.9 % IV SOLN
Freq: Once | INTRAVENOUS | Status: AC
  Administered 2016-08-20: 15:00:00 via INTRAVENOUS

## 2016-08-20 MED ORDER — DEXTROSE 5 % IV SOLN
1.0000 g | INTRAVENOUS | Status: DC
  Administered 2016-08-20 – 2016-08-21 (×2): 1 g via INTRAVENOUS
  Filled 2016-08-20 (×2): qty 10

## 2016-08-20 MED ORDER — AMIODARONE IV BOLUS ONLY 150 MG/100ML
150.0000 mg | Freq: Once | INTRAVENOUS | Status: AC
  Administered 2016-08-20: 150 mg via INTRAVENOUS
  Filled 2016-08-20: qty 100

## 2016-08-20 MED ORDER — HEPARIN (PORCINE) IN NACL 100-0.45 UNIT/ML-% IJ SOLN
1350.0000 [IU]/h | INTRAMUSCULAR | Status: DC
  Administered 2016-08-20: 1000 [IU]/h via INTRAVENOUS
  Filled 2016-08-20 (×2): qty 250

## 2016-08-20 MED ORDER — HEPARIN BOLUS VIA INFUSION
2000.0000 [IU] | Freq: Once | INTRAVENOUS | Status: AC
  Administered 2016-08-20: 2000 [IU] via INTRAVENOUS
  Filled 2016-08-20: qty 2000

## 2016-08-20 NOTE — Progress Notes (Signed)
Subjective:   Patient complains of left-sided localized chest pain increased with movement also complains of left shoulder pain. Patient converted to A. fib with RVR started on IV amiodarone and heparin rate better controlled remains in A. fib  Objective:  Vital Signs in the last 24 hours: Temp:  [98 F (36.7 C)-98.9 F (37.2 C)] 98.1 F (36.7 C) (12/07 0406) Pulse Rate:  [69-92] 92 (12/07 0406) Resp:  [16-22] 18 (12/07 0406) BP: (82-105)/(59-81) 93/81 (12/07 0406) SpO2:  [92 %-97 %] 93 % (12/07 0406) Weight:  [154 lb 12.8 oz (70.2 kg)] 154 lb 12.8 oz (70.2 kg) (12/07 0406)  Intake/Output from previous day: 12/06 0701 - 12/07 0700 In: [id number] [P.O.:342; I.V.:800] Out: 800 [Urine:800] Intake/Output from this shift: No intake/output data recorded.  Physical Exam: Neck: no adenopathy, no carotid bruit, no JVD and supple, symmetrical, trachea midline Lungs: Decreased breath sound at bases Heart: irregularly irregular rhythm, S1, S2 normal and Soft systolic murmur noted no pericardial rub Abdomen: soft, non-tender; bowel sounds normal; no masses,  no organomegaly Extremities: extremities normal, atraumatic, no cyanosis or edema  Lab Results:  Recent Labs  08/19/16 0948 08/20/16 0125  WBC 13.7* 11.1*  HGB 11.5* 10.8*  PLT 365 339    Recent Labs  08/19/16 0948 08/20/16 0125  NA 134* 135  K 4.3 3.9  CL 103 107  CO2 20* 18*  GLUCOSE 129* 100*  BUN 31* 34*  CREATININE 2.73* 2.65*    Recent Labs  08/20/16 0125 08/20/16 0714  TROPONINI <0.03 <0.03   Hepatic Function Panel  Recent Labs  08/19/16 0948  PROT 7.7  ALBUMIN 2.5*  AST 15  ALT 19  ALKPHOS 120  BILITOT 0.7    Recent Labs  08/20/16 0125  CHOL 122   No results for input(s): PROTIME in the last 72 hours.  Imaging: Imaging results have been reviewed and Dg Chest 2 View  Result Date: 08/19/2016 CLINICAL DATA:  65 year old male with shortness of breath and left chest pain for 3 days. Initial  encounter. EXAM: CHEST  2 VIEW COMPARISON:  Chest radiographs 07/25/2015 and earlier. Interventional radiology images from left side nephroureteral catheter exchange 08/11/2016 and earlier. FINDINGS: Seated upright AP and lateral views of the chest. There appears to be an external catheter along the left chest wall. This may be related to the left nephro ureteral catheter. Lower lung volumes. Left greater than right lower lobe platelike opacity most resembling atelectasis. No pneumothorax or pulmonary edema. No definite pleural effusion. No other confluent pulmonary opacity. Normal cardiac size and mediastinal contours. Visualized tracheal air column is within normal limits. No acute osseous abnormality identified. Negative visible bowel gas pattern. IMPRESSION: 1. Lower lung volumes with left greater than right lower lobe opacity most resembling atelectasis. 2. Apparently external catheter along the left chest wall, might be related to the left nephroureteral catheter. Electronically Signed   By: Genevie Ann M.D.   On: 08/19/2016 08:59    Cardiac Studies:  Assessment/Plan:  Stable angina MI ruled out Multivessel CAD New-onset A. fib with RVR chadsvasc score of 2 History of anterolateral wall MI in the past Hypertension Hyperlipidemia Chronic kidney disease stage IV Anemia of chronic disease EtOH abuse Tobacco abuse High-grade metastatic bladder cancer History of bilateral hydronephrosis History of kidney stone History of recent right urostomy tube UTI Plan Change Augmentin to Rocephin while in the hospital as per orders  LOS: 1 day    Charolette Forward 08/20/2016, 9:23 AM

## 2016-08-20 NOTE — Progress Notes (Signed)
Pt rhythm changed from NSR to Afib overnight.  EKG completed for confirmation. LM with Dr Zenia Resides office.

## 2016-08-20 NOTE — Progress Notes (Addendum)
ANTICOAGULATION CONSULT NOTE - Initial Consult  Pharmacy Consult for heparin Indication: atrial fibrillation  No Known Allergies  Patient Measurements: Height: 5\' 6"  (167.6 cm) Weight: 154 lb 12.8 oz (70.2 kg) IBW/kg (Calculated) : 63.8  Vital Signs: Temp: 98.1 F (36.7 C) (12/07 0406) Temp Source: Oral (12/07 0406) BP: 93/81 (12/07 0406) Pulse Rate: 92 (12/07 0406)  Labs:  Recent Labs  08/19/16 0829 08/19/16 0948 08/19/16 2017 08/20/16 0125  HGB 11.0* 11.5*  --  10.8*  HCT 33.5* 33.9*  --  31.1*  PLT 331 365  --  339  CREATININE 2.79* 2.73*  --  2.65*  TROPONINI <0.03  --  <0.03 <0.03    Estimated Creatinine Clearance: 25.7 mL/min (by C-G formula based on SCr of 2.65 mg/dL (H)).   Medical History: Past Medical History:  Diagnosis Date  . 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 Alcoholic ([place])   . 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 Bladder cancer ([place]) 07/2014  . Chronic back pain   . Coronary artery disease   . GERD (gastroesophageal reflux disease)   . History of blood transfusion    "related to bleeding ulcers on my esophagus"  . Hypertension   . Kidney stones   . Upper GI bleed     Medications:  Prescriptions Prior to Admission  Medication Sig Dispense Refill Last Dose  . amoxicillin-clavulanate (AUGMENTIN) 500-125 MG tablet Take 1 tablet (500 mg total) by mouth every 12 (twelve) hours. 5 tablet 10 Past Week at Unknown time  . aspirin EC 81 MG EC tablet Take 1 tablet (81 mg total) by mouth daily. 30 tablet 3 08/19/2016 at Unknown time  . atorvastatin (LIPITOR) 40 MG tablet Take 1 tablet (40 mg total) by mouth daily at 6 PM. 30 tablet 3 Past Week at Unknown time  . carvedilol (COREG) 3.125 MG tablet Take 1 tablet (3.125 mg total) by mouth 2 (two) times daily with a meal. 60 tablet 3 08/17/2016 at 1800  . clopidogrel (PLAVIX) 75 MG tablet Take 1 tablet (75 mg total) by mouth daily. 30 tablet 3 08/17/2016 at 1800  . lisinopril  (PRINIVIL,ZESTRIL) 2.5 MG tablet Take 1 tablet (2.5 mg total) by mouth daily. 30 tablet 3 Past Week at Unknown time  . nitroGLYCERIN (NITROSTAT) 0.4 MG SL tablet Place 1 tablet (0.4 mg total) under the tongue every 5 (five) minutes x 3 doses as needed for chest pain. 25 tablet 12 unk at unk  . Oxycodone HCl 10 MG TABS Take 10-20 mg by mouth every 4 (four) hours as needed (pain).    Past Week at Unknown time  . pantoprazole (PROTONIX) 40 MG tablet Take 1 tablet (40 mg total) by mouth 2 (two) times daily. 60 tablet 3 Past Week at Unknown time  . polyethylene glycol (MIRALAX / GLYCOLAX) packet Take 17 g by mouth daily as needed for mild constipation. 14 each 0 Past Week at Unknown time   Scheduled:  . amiodarone  150 mg Intravenous Once  . amoxicillin-clavulanate  1 tablet Oral Q12H  . aspirin EC  81 mg Oral Daily  . atorvastatin  40 mg Oral q1800  . carvedilol  3.125 mg Oral BID WC  . clopidogrel  75 mg Oral Daily  . heparin  5,000 Units Subcutaneous Q8H  . pantoprazole  40 mg Oral BID   Infusions:  . sodium chloride 50 mL/hr at 08/20/16 0418  . amiodarone    . amiodarone      Assessment:  65yo male admitted yesterday for atypical CP changed from NSR overnight to Afib, amiodarone started, now to start heparin.  Goal of Therapy:  Heparin level 0.3-0.7 units/ml Monitor platelets by anticoagulation protocol: Yes   Plan:  Rec'd heparin 5000 units SQ ~1hr ago; will begin heparin gtt at 1000 units/hr and monitor heparin levels and CBC.  Wynona Neat, PharmD, BCPS  08/20/2016,6:49 AM

## 2016-08-20 NOTE — Progress Notes (Signed)
ANTICOAGULATION CONSULT NOTE - Follow Up Consult  Pharmacy Consult for Heparin Indication: atrial fibrillation  No Known Allergies  Patient Measurements: Height: 5\' 6"  (167.6 cm) Weight: 154 lb 12.8 oz (70.2 kg) IBW/kg (Calculated) : 63.8 Heparin Dosing Weight:  70.2 kg  Vital Signs: Temp: 98.8 F (37.1 C) (12/07 1147) Temp Source: Oral (12/07 1147) BP: 77/61 (12/07 1424) Pulse Rate: 92 (12/07 0406)  Labs:  Recent Labs  08/19/16 0829 08/19/16 0948 08/19/16 2017 08/20/16 0125 08/20/16 0714 08/20/16 1441  HGB 11.0* 11.5*  --  10.8*  --   --   HCT 33.5* 33.9*  --  31.1*  --   --   PLT 331 365  --  339  --   --   HEPARINUNFRC  --   --   --   --   --  0.11*  CREATININE 2.79* 2.73*  --  2.65*  --   --   TROPONINI <0.03  --  <0.03 <0.03 <0.03  --     Estimated Creatinine Clearance: 25.7 mL/min (by C-G formula based on SCr of 2.65 mg/dL (H)).   Assessment: 65yo male admitted 12/6 for atypical CP changed from NSR overnight to Afib, amiodarone started, now to start heparin.  Anticoag: CP. Enzymes negative. +afib. LDL 84. Hgb 10.8 stable. HL 0.11 low  Goal of Therapy:  Heparin level 0.3-0.7 Monitor platelets by anticoagulation protocol: Yes   Plan:  Heparin 2000 unit IV bolus Heparin infusion up to 1250 units/hr   Cornellius Kropp S. Alford Highland, PharmD, BCPS Clinical Staff Pharmacist Pager [phone]  Eilene Ghazi Stillinger 08/20/2016,3:33 PM

## 2016-08-21 LAB — CBC
HCT: 29.5 % — ABNORMAL LOW (ref 39.0–52.0)
Hemoglobin: 9.7 g/dL — ABNORMAL LOW (ref 13.0–17.0)
MCH: 28 pg (ref 26.0–34.0)
MCHC: 32.9 g/dL (ref 30.0–36.0)
MCV: 85.3 fL (ref 78.0–100.0)
PLATELETS: 350 10*3/uL (ref 150–400)
RBC: 3.46 MIL/uL — AB (ref 4.22–5.81)
RDW: 16.7 % — AB (ref 11.5–15.5)
WBC: 11.1 10*3/uL — AB (ref 4.0–10.5)

## 2016-08-21 LAB — HEPARIN LEVEL (UNFRACTIONATED): Heparin Unfractionated: 0.28 IU/mL — ABNORMAL LOW (ref 0.30–0.70)

## 2016-08-21 MED ORDER — AMIODARONE HCL 200 MG PO TABS
200.0000 mg | ORAL_TABLET | Freq: Every day | ORAL | Status: DC
  Administered 2016-08-21: 200 mg via ORAL
  Filled 2016-08-21: qty 1

## 2016-08-21 MED ORDER — AMIODARONE HCL 200 MG PO TABS: 200.0000 mg | ORAL_TABLET | Freq: Every day | ORAL | 3 refills | Status: DC

## 2016-08-21 MED ORDER — APIXABAN 2.5 MG PO TABS
2.5000 mg | ORAL_TABLET | Freq: Two times a day (BID) | ORAL | Status: DC
  Administered 2016-08-21: 2.5 mg via ORAL
  Filled 2016-08-21: qty 1

## 2016-08-21 MED ORDER — APIXABAN 2.5 MG PO TABS: 2.5000 mg | ORAL_TABLET | Freq: Two times a day (BID) | ORAL | 3 refills | Status: DC

## 2016-08-21 MED ORDER — FERROUS SULFATE 300 (60 FE) MG/5ML PO SYRP: 300.0000 mg | ORAL_SOLUTION | Freq: Every day | ORAL | 3 refills | Status: DC

## 2016-08-21 MED FILL — Nitroglycerin IV Soln 100 MCG/ML in D5W: INTRA_ARTERIAL | Qty: 10 | Status: AC

## 2016-08-21 MED FILL — Lidocaine HCl Local Preservative Free (PF) Inj 1%: INTRAMUSCULAR | Qty: 30 | Status: AC

## 2016-08-21 NOTE — Discharge Instructions (Addendum)
Angina Pectoris °Angina pectoris, often called angina, is extreme discomfort in the chest, neck, or arm. This is caused by a lack of blood in the middle and thickest layer of the heart wall (myocardium). There are four types of angina: °· Stable angina. Stable angina usually occurs in episodes of predictable frequency and duration. It is usually brought on by physical activity, stress, or excitement. Stable angina usually lasts a few minutes and can often be relieved by a medicine that you place under your tongue. This medicine is called sublingual nitroglycerin. °· Unstable angina. Unstable angina can occur even when you are doing little or no physical activity. It can even occur while you are sleeping or when you are at rest. It can suddenly increase in severity or frequency. It may not be relieved by sublingual nitroglycerin, and it can last up to 30 minutes. °· Microvascular angina. This type of angina is caused by a disorder of tiny blood vessels called arterioles. Microvascular angina is more common in women. The pain may be more severe and last longer than other types of angina pectoris. °· Prinzmetal or variant angina. This type of angina pectoris is rare and usually occurs when you are doing little or no physical activity. It especially occurs in the early morning hours. ° °What are the causes? °Atherosclerosis is the cause of angina. This is the buildup of fat and cholesterol (plaque) on the inside of the arteries. Over time, the plaque may narrow or block the artery, and this will lessen blood flow to the heart. Plaque can also become weak and break off within a coronary artery to form a clot and cause a sudden blockage. °What increases the risk? °Risk factors common to both men and women include: °· High cholesterol levels. °· High blood pressure (hypertension). °· Tobacco use. °· Diabetes. °· Family history of angina. °· Obesity. °· Lack of exercise. °· A diet high in saturated fats. ° °Women are at  greater risk for angina if they are: °· Over age 55. °· Postmenopausal. ° °What are the signs or symptoms? °Many people do not experience any symptoms during the early stages of angina. As the condition progresses, symptoms common to both men and women may include: °· Chest pain. °? The pain can be described as a crushing or squeezing in the chest, or a tightness, pressure, fullness, or heaviness in the chest. °? The pain can last more than a few minutes, or it can stop and recur. °· Pain in the arms, neck, jaw, or back. °· Unexplained heartburn or indigestion. °· Shortness of breath. °· Nausea. °· Sudden cold sweats. °· Sudden light-headedness. ° °Many women have chest discomfort and some of the other symptoms. However, women often have different (atypical) symptoms, such as: °· Fatigue. °· Unexplained feelings of nervousness or anxiety. °· Unexplained weakness. °· Dizziness or fainting. ° °Sometimes, women may have angina without any symptoms. °How is this diagnosed? °Tests to diagnose angina may include: °· ECG (electrocardiogram). °· Exercise stress test. This looks for signs of blockage when the heart is being exercised. °· Pharmacologic stress test. This test looks for signs of blockage when the heart is being stressed with a medicine. °· Blood tests. °· Coronary angiogram. This is a procedure to look at the coronary arteries to see if there is any blockage. ° °How is this treated? °The treatment of angina may include the following: °· Healthy behavioral changes to reduce or control risk factors. °· Medicine. °· Coronary stenting. A stent helps   Coronary angioplasty. This procedure widens a narrowed or blocked artery.  Coronary arterybypass surgery. This will allow your blood to pass the blockage (bypass) to reach your heart. Follow these instructions at home:  Take medicines only as directed by your health care provider.  Do not take the following medicines unless your health care provider  approves:  Nonsteroidal anti-inflammatory drugs (NSAIDs), such as ibuprofen, naproxen, or celecoxib.  Vitamin supplements that contain vitamin A, vitamin E, or both.  Hormone replacement therapy that contains estrogen with or without progestin.  Manage other health conditions such as hypertension and diabetes as directed by your health care provider.  Follow a heart-healthy diet. A dietitian can help to educate you about healthy food options and changes.  Use healthy cooking methods such as roasting, grilling, broiling, baking, poaching, steaming, or stir-frying. Talk to a dietitian to learn more about healthy cooking methods.  Follow an exercise program approved by your health care provider.  Maintain a healthy weight. Lose weight as approved by your health care provider.  Plan rest periods when fatigued.  Learn to manage stress.  Do not use any tobacco products, including cigarettes, chewing tobacco, or electronic cigarettes. If you need help quitting, ask your health care provider.  If you drink alcohol, and your health care provider approves, limit your alcohol intake to no more than 1 drink per day. One drink equals 12 ounces of beer, 5 ounces of wine, or 1 ounces of hard liquor.  Stop illegal drug use.  Keep all follow-up visits as directed by your health care provider. This is important. Get help right away if:  You have pain in your chest, neck, arm, jaw, stomach, or back that lasts more than a few minutes, is recurring, or is unrelieved by taking sublingualnitroglycerin.  You have profuse sweating without cause.  You have unexplained:  Heartburn or indigestion.  Shortness of breath or difficulty breathing.  Nausea or vomiting.  Fatigue.  Feelings of nervousness or anxiety.  Weakness.  Diarrhea.  You have sudden light-headedness or dizziness.  You faint. These symptoms may represent a serious problem that is an emergency. Do not wait to see if the  symptoms will go away. Get medical help right away. Call your local emergency services (911 in the [place] Do not drive yourself to the hospital.  This information is not intended to replace advice given to you by your health care provider. Make sure you discuss any questions you have with your health care provider. Document Released: 08/31/2005 Document Revised: 02/12/2016 Document Reviewed: 01/02/2014 Elsevier Interactive Patient Education  2017 [employer] on my medicine - ELIQUIS (apixaban)  This medication education was reviewed with me or my healthcare representative as part of my discharge preparation.  The pharmacist that spoke with me during my hospital stay was:  Wayland Salinas, [facility]  Why was Eliquis prescribed for you? Eliquis was prescribed for you to reduce the risk of a blood clot forming that can cause a stroke if you have a medical condition called atrial fibrillation (a type of irregular heartbeat).  What do You need to know about Eliquis ? Take your Eliquis TWICE DAILY - one tablet in the morning and one tablet in the evening with or without food. If you have difficulty swallowing the tablet whole please discuss with your pharmacist how to take the medication safely.  Take Eliquis exactly as prescribed by your doctor and DO NOT stop taking Eliquis without talking to the doctor who prescribed  the medication.  Stopping may increase your risk of developing a stroke.  Refill your prescription before you run out.  After discharge, you should have regular check-up appointments with your healthcare provider that is prescribing your Eliquis.  In the future your dose may need to be changed if your kidney function or weight changes by a significant amount or as you get older.  What do you do if you miss a dose? If you miss a dose, take it as soon as you remember on the same day and resume taking twice daily.  Do not take more than one dose of ELIQUIS  at the same time to make up a missed dose.  Important Safety Information A possible side effect of Eliquis is bleeding. You should call your healthcare provider right away if you experience any of the following: ? Bleeding from an injury or your nose that does not stop. ? Unusual colored urine (red or dark brown) or unusual colored stools (red or black). ? Unusual bruising for unknown reasons. ? A serious fall or if you hit your head (even if there is no bleeding).  Some medicines may interact with Eliquis and might increase your risk of bleeding or clotting while on Eliquis. To help avoid this, consult your healthcare provider or pharmacist prior to using any new prescription or non-prescription medications, including herbals, vitamins, non-steroidal anti-inflammatory drugs (NSAIDs) and supplements.  This website has more information on Eliquis (apixaban): http://www.eliquis.com/eliquis/home     Nonspecific Chest Pain Chest pain can be caused by many different conditions. There is always a chance that your pain could be related to something serious, such as a heart attack or a blood clot in your lungs. Chest pain can also be caused by conditions that are not life-threatening. If you have chest pain, it is very important to follow up with your health care provider. What are the causes? Causes of this condition include:  Heartburn.  Pneumonia or bronchitis.  Anxiety or stress.  Inflammation around your heart (pericarditis) or lung (pleuritis or pleurisy).  A blood clot in your lung.  A collapsed lung (pneumothorax). This can develop suddenly on its own (spontaneous pneumothorax) or from trauma to the chest.  Shingles infection (varicella-zoster virus).  Heart attack.  Damage to the bones, muscles, and cartilage that make up your chest wall. This can include:  Bruised bones due to injury.  Strained muscles or cartilage due to frequent or repeated coughing or  overwork.  Fracture to one or more ribs.  Sore cartilage due to inflammation (costochondritis). What increases the risk? Risk factors for this condition may include:  Activities that increase your risk for trauma or injury to your chest.  Respiratory infections or conditions that cause frequent coughing.  Medical conditions or overeating that can cause heartburn.  Heart disease or family history of heart disease.  Conditions or health behaviors that increase your risk of developing a blood clot.  Having had chicken pox (varicella zoster). What are the signs or symptoms? Chest pain can feel like:  Burning or tingling on the surface of your chest or deep in your chest.  Crushing, pressure, aching, or squeezing pain.  Dull or sharp pain that is worse when you move, cough, or take a deep breath.  Pain that is also felt in your back, neck, shoulder, or arm, or pain that spreads to any of these areas. Your chest pain may come and go, or it may stay constant. How is this diagnosed? Lab tests or  other studies may be needed to find the cause of your pain. Your health care provider may have you take a test called an ECG (electrocardiogram). An ECG records your heartbeat patterns at the time the test is performed. You may also have other tests, such as:  Transthoracic echocardiogram (TTE). In this test, sound waves are used to create a picture of the heart structures and to look at how blood flows through your heart.  Transesophageal echocardiogram (TEE).This is a more advanced imaging test that takes images from inside your body. It allows your health care provider to see your heart in finer detail.  Cardiac monitoring. This allows your health care provider to monitor your heart rate and rhythm in real time.  Holter monitor. This is a portable device that records your heartbeat and can help to diagnose abnormal heartbeats. It allows your health care provider to track your heart activity  for several days, if needed.  Stress tests. These can be done through exercise or by taking medicine that makes your heart beat more quickly.  Blood tests.  Other imaging tests. How is this treated? Treatment depends on what is causing your chest pain. Treatment may include:  Medicines. These may include:  Acid blockers for heartburn.  Anti-inflammatory medicine.  Pain medicine for inflammatory conditions.  Antibiotic medicine, if an infection is present.  Medicines to dissolve blood clots.  Medicines to treat coronary artery disease (CAD).  Supportive care for conditions that do not require medicines. This may include:  Resting.  Applying heat or cold packs to injured areas.  Limiting activities until pain decreases. Follow these instructions at home: Medicines  If you were prescribed an antibiotic, take it as told by your health care provider. Do not stop taking the antibiotic even if you start to feel better.  Take over-the-counter and prescription medicines only as told by your health care provider. Lifestyle  Do not use any products that contain nicotine or tobacco, such as cigarettes and e-cigarettes. If you need help quitting, ask your health care provider.  Do not drink alcohol.  Make lifestyle changes as directed by your health care provider. These may include:  Getting regular exercise. Ask your health care provider to suggest some activities that are safe for you.  Eating a heart-healthy diet. A registered dietitian can help you to learn healthy eating options.  Maintaining a healthy weight.  Managing diabetes, if necessary.  Reducing stress, such as with yoga or relaxation techniques. General instructions  Avoid any activities that bring on chest pain.  If heartburn is the cause for your chest pain, raise (elevate) the head of your bed about 6 inches (15 cm) by putting blocks under the legs. Sleeping with more pillows does not effectively relieve  heartburn because it only changes the position of your head.  Keep all follow-up visits as told by your health care provider. This is important. This includes any further testing if your chest pain does not go away. Contact a health care provider if:  Your chest pain does not go away.  You have a rash with blisters on your chest.  You have a fever.  You have chills. Get help right away if:  Your chest pain is worse.  You have a cough that gets worse, or you cough up blood.  You have severe pain in your abdomen.  You have severe weakness.  You faint.  You have sudden, unexplained chest discomfort.  You have sudden, unexplained discomfort in your arms, back,  neck, or jaw.  You have shortness of breath at any time.  You suddenly start to sweat, or your skin gets clammy.  You feel nauseous or you vomit.  You suddenly feel light-headed or dizzy.  Your heart begins to beat quickly, or it feels like it is skipping beats. These symptoms may represent a serious problem that is an emergency. Do not wait to see if the symptoms will go away. Get medical help right away. Call your local emergency services (911 in the [place] Do not drive yourself to the hospital.  This information is not intended to replace advice given to you by your health care provider. Make sure you discuss any questions you have with your health care provider. Document Released: 06/10/2005 Document Revised: 05/25/2016 Document Reviewed: 05/25/2016 Elsevier Interactive Patient Education  2017 [employer].

## 2016-08-21 NOTE — Discharge Summary (Signed)
Discharge summary dictated on 08/21/2016, dictation number is [phone]

## 2016-08-21 NOTE — Progress Notes (Signed)
ANTICOAGULATION CONSULT NOTE - Follow Up Consult  Pharmacy Consult for Heparin  Indication: atrial fibrillation  No Known Allergies  Patient Measurements: Height: 5\' 6"  (167.6 cm) Weight: 154 lb 12.8 oz (70.2 kg) IBW/kg (Calculated) : 63.8  Vital Signs: Temp: 98.9 F (37.2 C) (12/07 2046) Temp Source: Oral (12/07 2046) BP: 87/62 (12/07 2011) Pulse Rate: 55 (12/07 2046)  Labs:  Recent Labs  08/19/16 0829 08/19/16 0948 08/19/16 2017 08/20/16 0125 08/20/16 0714 08/20/16 1441 08/20/16 2342  HGB 11.0* 11.5*  --  10.8*  --   --   --   HCT 33.5* 33.9*  --  31.1*  --   --   --   PLT 331 365  --  339  --   --   --   HEPARINUNFRC  --   --   --   --   --  0.11* 0.28*  CREATININE 2.79* 2.73*  --  2.65*  --   --   --   TROPONINI <0.03  --  <0.03 <0.03 <0.03  --   --     Estimated Creatinine Clearance: 25.7 mL/min (by C-G formula based on SCr of 2.65 mg/dL (H)).  Assessment: Heparin for afib, heparin level still slightly low after rate increase, no issues per RN.   Goal of Therapy:  Heparin level 0.3-0.7 units/ml Monitor platelets by anticoagulation protocol: Yes   Plan:  -Inc heparin to 1350 units/hr -0900 HL  Narda Bonds 08/21/2016,12:28 AM

## 2016-08-21 NOTE — Care Management Note (Signed)
Case Management Note  Patient Details  Name: RITO PENG MRN: [id number] Date of Birth: 1951/03/09  Subjective/Objective:        Pt admitted with STEMI            Action/Plan:  PTA independent from home with wife.  CM provided both free 30 card and copay card for Eliquis directly to pt.  CM contacted pharmacy of choice [facility] in [place] informed CM that they are able to fill prescription today.  NO other CM needs identified prior to discharge   Expected Discharge Date:                  Expected Discharge Plan:  Home/Self Care  In-House Referral:     Discharge planning Services  CM Consult, Medication Assistance  Post Acute Care Choice:    Choice offered to:     DME Arranged:    DME Agency:     HH Arranged:    HH Agency:     Status of Service:  Completed, signed off  If discussed at [employer] of Stay Meetings, dates discussed:    Additional Comments:  Maryclare Labrador, RN 08/21/2016, 10:23 AM

## 2016-08-22 NOTE — Discharge Summary (Signed)
NAMELEE, BAU                 ACCOUNT NO.:  [id number]  MEDICAL RECORD NO.:  [id number]  LOCATION:                                 FACILITY:  PHYSICIAN:  Kala Gassmann N. Terrence Dupont, M.D. DATE OF BIRTH:  05/10/1951  DATE OF ADMISSION:  08/19/2016 DATE OF DISCHARGE:  08/21/2016                              DISCHARGE SUMMARY   ADMITTING DIAGNOSES: 1. Atypical chest pain, rule out pericarditis, doubt myocardial     infarction. 2. Multivessel coronary artery disease. 3. 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 4. Hypertension. 5. Hyperlipidemia. 6. Chronic kidney disease, stage 4. 7. Anemia of chronic disease. 8. EtOH abuse. 9. Tobacco abuse. 10.History of high-grade metastatic bladder cancer. 11.History of bilateral hydronephrosis. 12.History of kidney stone. 13.History of recent right urostomy tube placement. 14.Resolving urinary tract infection.  FINAL DIAGNOSES: 1. Status post atypical chest pain, myocardial infarction ruled out.     Stable angina, multivessel coronary artery disease, status post new     onset atrial fibrillation with rapid ventricular response,     converted back to normal sinus rhythm, CHADS-VASc score of 2.     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 2. Hypertension. 3. Hyperlipidemia. 4. Chronic kidney disease, stage 4. 5. Anemia of chronic disease. 6. EtOH abuse. 7. Tobacco abuse. 8. High-grade metastatic bladder cancer. 9. History of bilateral hydronephrosis. 10.History of kidney stone. 11.History of recent right urostomy tube. 12.Resolving urinary tract infection.  DISCHARGE MEDICATIONS: 1. Amiodarone 200 mg 1 tablet daily. 2. Apixaban 2.5 mg 1 tablet twice daily. 3. Ferrous sulfate 300 mg/5 mL syrup, 5 mL daily. 4. Augmentin 500 mg 1 tablet twice daily as before. 5. Aspirin 81 mg 1 tablet daily. 6. Atorvastatin 40 mg 1 tablet daily. 7. Carvedilol 3.125 mg twice daily. 8. Nitrostat sublingual  p.r.n. 9. Oxycodone 10-20 mg 4 times daily as needed for pain. 10.Protonix 40 mg 1 tablet daily. 11.MiraLAX 17 g by mouth daily as needed for constipation. The patient has been advised to stop clopidogrel.  DIET:  Low salt, low cholesterol.  FOLLOWUP:  Follow up with me in 1 week.  Follow up with Urology as scheduled.  CONDITION AT DISCHARGE:  Stable.  BRIEF HISTORY AND HOSPITAL COURSE:  Mr. Roulhac is 65 year old male with past medical history significant for coronary artery disease, history of anterolateral wall myocardial infarction in the past, multivessel CAD, hypertension, hyperlipidemia, history of alcohol abuse, tobacco abuse, history of bladder cancer status post radical cystoprostatectomy with bilateral pelvic lymphadenopathy, and ileal loop conduit, urinary diversion, history of right hydronephrosis, recently placed nephrostomy tube, history of GI bleed in the past.  He came to the ER by EMS as code STEMI was called.  The patient complained initially of back pain, which started last night followed by left-sided chest pain off and on since last night.  Chest pain increased with deep breathing.  Denies any history of anginal chest pain.  Denies shortness of breath.  EKG done on the field showed normal sinus rhythm with Q-waves in septal leads with diffuse ST elevation and minimal PR depression, code STEMI was canceled.  PHYSICAL EXAMINATION:  GENERAL:  He was alert,  awake, oriented x3. Hemodynamically stable. VITAL SIGNS:  Blood pressure was 111/82, pulse 78 and regular. HEENT:  Conjunctivae pink. NECK:  Supple.  No JVD.  No bruit. LUNGS:  Decreased breath sounds at bases. CARDIOVASCULAR:  S1, S2 normal.  There was soft systolic murmur.  No S3 gallop.  No friction rub. ABDOMEN:  Soft.  Bowel sounds are present.  Nontender. EXTREMITIES:  There is no clubbing, cyanosis, or edema. NEURO:  He was alert, awake, oriented to person, place, and time.  LABORATORY DATA:  Sodium  was 138, potassium 4.4, BUN 26.  Creatinine has gone up to 3.06, repeat creatinine is trending down 2.79, 2.73, and 2.65.  Three sets of troponin-I were negative.  Cholesterol was 122, triglycerides 81, HDL was low 22, LDL 84.  Hemoglobin was 11, hematocrit 33.5, white count of 14.7.  BRIEF HOSPITAL COURSE:  The patient came to the hospital as code STEMI which was canceled.  The patient did not have any anginal chest pain during the hospital stay.  Three sets of troponin-I were negative.  The patient went into atrial fibrillation with RVR, during the hospital stay requiring IV heparin and IV amiodarone which the patient converted back to sinus rhythm.  The patient has remained in sinus rhythm since yesterday evening.  His amiodarone has been switched to p.o. and heparin has been switched to Eliquis p.o.  The patient had episode of hypotension requiring fluid bolus.  His hemoglobin has dropped to 9.7, but there is no obvious source of blood loss.  Slight drop in hemoglobin is attributed to hydration.  Will monitor his hemoglobin and CBC as outpatient.  The patient is doing well, ambulating in room without any problems.  The patient will be discharged home on above medications and will be followed up in my office in 1 week.  The patient has appointment to see urologist as outpatient also.     Allegra Lai. Terrence Dupont, M.D.     MNH/MEDQ  D:  08/21/2016  T:  08/22/2016  Job:  [id number]

## 2016-08-24 ENCOUNTER — Other Ambulatory Visit: Payer: Self-pay | Admitting: Urology

## 2016-09-08 ENCOUNTER — Ambulatory Visit (HOSPITAL_COMMUNITY)
Admit: 2016-09-08 | Discharge: 2016-09-08 | Disposition: A | Discharge status: Home / Self Care | Payer: BLUE CROSS/BLUE SHIELD | Source: Ambulatory Visit | Attending: Urology | Admitting: Urology

## 2016-09-08 ENCOUNTER — Encounter (HOSPITAL_COMMUNITY): Payer: Self-pay | Admitting: Interventional Radiology

## 2016-09-08 ENCOUNTER — Other Ambulatory Visit: Payer: Self-pay | Admitting: Urology

## 2016-09-08 DIAGNOSIS — C679 Malignant neoplasm of bladder, unspecified: Secondary | ICD-10-CM

## 2016-09-08 DIAGNOSIS — Z436 Encounter for attention to other artificial openings of urinary tract: Secondary | ICD-10-CM | POA: Diagnosis not present

## 2016-09-08 DIAGNOSIS — C772 Secondary and unspecified malignant neoplasm of intra-abdominal lymph nodes: Secondary | ICD-10-CM

## 2016-09-08 HISTORY — PX: IR GENERIC HISTORICAL: IMG1180011

## 2016-09-08 MED ORDER — LIDOCAINE HCL 1 % IJ SOLN
INTRAMUSCULAR | Status: AC
  Filled 2016-09-08: qty 20

## 2016-09-08 MED ORDER — IOPAMIDOL (ISOVUE-300) INJECTION 61%
INTRAVENOUS | Status: AC
  Administered 2016-09-08: 10 mL
  Filled 2016-09-08: qty 50

## 2016-09-08 MED ORDER — LIDOCAINE HCL 1 % IJ SOLN
INTRAMUSCULAR | Status: DC | PRN
  Administered 2016-09-08: 10 mL

## 2016-09-08 NOTE — Procedures (Signed)
L 10 Fr nephroureteral cath exchange R 10 Fr PCN exchange  R PCN urine is cloudy. Cx taken and the patient was placed on Augmentin  Pt to undergo R PCN OR lithotomy at the end of January with Dr. Tresa Moore.  No comp/EBL

## 2016-09-09 ENCOUNTER — Other Ambulatory Visit: Payer: Self-pay | Admitting: Urology

## 2016-09-09 DIAGNOSIS — C679 Malignant neoplasm of bladder, unspecified: Secondary | ICD-10-CM

## 2016-09-11 LAB — URINE CULTURE

## 2016-10-07 ENCOUNTER — Other Ambulatory Visit (HOSPITAL_COMMUNITY): Payer: BLUE CROSS/BLUE SHIELD

## 2016-10-09 NOTE — Progress Notes (Signed)
ECHO 08-19-16 EPIC

## 2016-10-09 NOTE — Patient Instructions (Addendum)
Darren Carr  10/09/2016   Your procedure is scheduled on: 10-14-16  Report to [facility]  Entrance take [facility]  elevators to 3rd floor to  [place] at 1200 PM.  Call this number if you have problems the morning of surgery [phone]   Remember: ONLY 1 PERSON MAY GO WITH YOU TO SHORT STAY TO GET  READY MORNING OF [place].  Do not eat food  :After Midnight, clear liquids from midnight until 800 am day of surgery, then nothing by mouth after 800 am day of surgery     Take these medicines the morning of surgery with A SIP OF WATER: AMIODARONE (PACERONE), CARVEDILOL (COREG), OXYCODONE  as needed                               You may not have any metal on your body including hair pins and              piercings  Do not wear jewelry, make-up, lotions, powders or perfumes, deodorant             Do not wear nail polish.  Do not shave  48 hours prior to surgery.              Men may shave face and neck.   Do not bring valuables to the hospital. [place].  Contacts, dentures or bridgework may not be worn into surgery.  Leave suitcase in the car. After surgery it may be brought to your room.     Please read over the following fact sheets you were given: _____________________________________________________________________      CLEAR LIQUID DIET   Foods Allowed                                                                     Foods Excluded  Coffee and tea, regular and decaf                             liquids that you cannot  Plain Jell-O in any flavor                                             see through such as: Fruit ices (not with fruit pulp)                                     milk, soups, orange juice  Iced Popsicles                                    All solid food Carbonated beverages, regular and diet  Cranberry, grape and apple juices Sports  drinks like Gatorade Lightly seasoned clear broth or consume(fat free) Sugar, honey syrup  Sample Menu Breakfast                                Lunch                                     Supper Cranberry juice                    Beef broth                            Chicken broth Jell-O                                     Grape juice                           Apple juice Coffee or tea                        Jell-O                                      Popsicle                                                Coffee or tea                        Coffee or tea  _____________________________________________________________________            [facility] - Preparing for Surgery Before surgery, you can play an important role.  Because skin is not sterile, your skin needs to be as free of germs as possible.  You can reduce the number of germs on your skin by washing with CHG (chlorahexidine gluconate) soap before surgery.  CHG is an antiseptic cleaner which kills germs and bonds with the skin to continue killing germs even after washing. Please DO NOT use if you have an allergy to CHG or antibacterial soaps.  If your skin becomes reddened/irritated stop using the CHG and inform your nurse when you arrive at Short Stay. Do not shave (including legs and underarms) for at least 48 hours prior to the first CHG shower.  You may shave your face/neck. Please follow these instructions carefully:  1.  Shower with CHG Soap the night before surgery and the  morning of Surgery.  2.  If you choose to wash your hair, wash your hair first as usual with your  normal  shampoo.  3.  After you shampoo, rinse your hair and body thoroughly to remove the  shampoo.                           4.  Use CHG as you would any other liquid soap.  You can apply chg directly  to the skin and wash                       Gently with a scrungie or clean washcloth.  5.  Apply the CHG Soap to your body ONLY FROM THE NECK DOWN.   Do not use  on face/ open                           Wound or open sores. Avoid contact with eyes, ears mouth and genitals (private parts).                       Wash face,  Genitals (private parts) with your normal soap.             6.  Wash thoroughly, paying special attention to the area where your surgery  will be performed.  7.  Thoroughly rinse your body with warm water from the neck down.  8.  DO NOT shower/wash with your normal soap after using and rinsing off  the CHG Soap.                9.  Pat yourself dry with a clean towel.            10.  Wear clean pajamas.            11.  Place clean sheets on your bed the night of your first shower and do not  sleep with pets. Day of Surgery : Do not apply any lotions/deodorants the morning of surgery.  Please wear clean clothes to the hospital/surgery center.  FAILURE TO FOLLOW THESE INSTRUCTIONS MAY RESULT IN THE CANCELLATION OF YOUR SURGERY PATIENT SIGNATURE_________________________________  NURSE SIGNATURE__________________________________  ________________________________________________________________________

## 2016-10-13 ENCOUNTER — Encounter (HOSPITAL_COMMUNITY)
Admission: RE | Admit: 2016-10-13 | Discharge: 2016-10-13 | Disposition: A | Discharge status: Home / Self Care | Payer: BLUE CROSS/BLUE SHIELD | Source: Ambulatory Visit | Attending: Urology | Admitting: Urology

## 2016-10-13 ENCOUNTER — Other Ambulatory Visit: Payer: Self-pay | Admitting: Urology

## 2016-10-13 ENCOUNTER — Encounter (HOSPITAL_COMMUNITY): Payer: Self-pay

## 2016-10-13 ENCOUNTER — Ambulatory Visit (HOSPITAL_COMMUNITY)
Admission: RE | Admit: 2016-10-13 | Discharge: 2016-10-13 | Disposition: A | Discharge status: Home / Self Care | Payer: BLUE CROSS/BLUE SHIELD | Source: Ambulatory Visit | Attending: Urology | Admitting: Urology

## 2016-10-13 DIAGNOSIS — C679 Malignant neoplasm of bladder, unspecified: Secondary | ICD-10-CM

## 2016-10-13 DIAGNOSIS — R9431 Abnormal electrocardiogram [ECG] [EKG]: Secondary | ICD-10-CM

## 2016-10-13 DIAGNOSIS — R9389 Abnormal findings on diagnostic imaging of other specified body structures: Secondary | ICD-10-CM

## 2016-10-13 DIAGNOSIS — Z906 Acquired absence of other parts of urinary tract: Secondary | ICD-10-CM

## 2016-10-13 DIAGNOSIS — Z436 Encounter for attention to other artificial openings of urinary tract: Secondary | ICD-10-CM

## 2016-10-13 DIAGNOSIS — N135 Crossing vessel and stricture of ureter without hydronephrosis: Secondary | ICD-10-CM

## 2016-10-13 HISTORY — PX: IR GENERIC HISTORICAL: IMG1180011

## 2016-10-13 HISTORY — DX: Personal history of urinary calculi: Z87.442

## 2016-10-13 LAB — CBC
HEMATOCRIT: 40.6 % (ref 39.0–52.0)
HEMOGLOBIN: 13.3 g/dL (ref 13.0–17.0)
MCH: 28.7 pg (ref 26.0–34.0)
MCHC: 32.8 g/dL (ref 30.0–36.0)
MCV: 87.7 fL (ref 78.0–100.0)
Platelets: 234 10*3/uL (ref 150–400)
RBC: 4.63 MIL/uL (ref 4.22–5.81)
RDW: 15.9 % — ABNORMAL HIGH (ref 11.5–15.5)
WBC: 10.7 10*3/uL — AB (ref 4.0–10.5)

## 2016-10-13 LAB — BASIC METABOLIC PANEL
ANION GAP: 6 (ref 5–15)
BUN: 24 mg/dL — ABNORMAL HIGH (ref 6–20)
CO2: 21 mmol/L — ABNORMAL LOW (ref 22–32)
Calcium: 8.9 mg/dL (ref 8.9–10.3)
Chloride: 108 mmol/L (ref 101–111)
Creatinine, Ser: 2.84 mg/dL — ABNORMAL HIGH (ref 0.61–1.24)
GFR calc non Af Amer: 22 mL/min — ABNORMAL LOW (ref >60)
GFR, EST AFRICAN AMERICAN: 26 mL/min — AB (ref >60)
Glucose, Bld: 76 mg/dL (ref 65–99)
POTASSIUM: 5 mmol/L (ref 3.5–5.1)
SODIUM: 135 mmol/L (ref 135–145)

## 2016-10-13 LAB — PROTIME-INR
INR: 1.12
Prothrombin Time: 14.5 seconds (ref 11.4–15.2)

## 2016-10-13 MED ORDER — IOPAMIDOL (ISOVUE-300) INJECTION 61%
INTRAVENOUS | Status: AC
  Filled 2016-10-13: qty 50

## 2016-10-13 NOTE — Progress Notes (Addendum)
Error made in releasing the labs/orders for surgery on 2/2 . The consent for 2/2 surgery is still viable but labwork and etc must be reordered. I have contacted Dr Lucy Antigua office and left voicemail with Se[place] to place new orders and have Dr Tresa Moore sign off on them for 2/2 surgery.

## 2016-10-13 NOTE — Progress Notes (Signed)
BMP results routed via epic to Dr T Manny 

## 2016-10-13 NOTE — Progress Notes (Addendum)
Lyn Hollingshead MD Anesthesia came in to assess and meet with patient. Hatchett aware of abnormal EKG today and abnormal CXR from 08-19-16 and that patientt last dose of Eliquis was today. Hatchett advised patient is OK for surgery tomorrow 10-14-16. Also does not require repeat of CXR from 08-19-16 . RN will continue with pre-op visit as usual.

## 2016-10-14 ENCOUNTER — Encounter (HOSPITAL_COMMUNITY): Payer: Self-pay | Admitting: *Deleted

## 2016-10-14 ENCOUNTER — Inpatient Hospital Stay (HOSPITAL_COMMUNITY): Payer: BLUE CROSS/BLUE SHIELD

## 2016-10-14 ENCOUNTER — Encounter (HOSPITAL_COMMUNITY)
Admission: RE | Disposition: A | Discharge status: Home / Self Care | Payer: Self-pay | Source: Ambulatory Visit | Attending: Urology

## 2016-10-14 ENCOUNTER — Inpatient Hospital Stay (HOSPITAL_COMMUNITY): Admission: RE | Admit: 2016-10-14 | Payer: BLUE CROSS/BLUE SHIELD | Source: Ambulatory Visit | Admitting: Urology

## 2016-10-14 ENCOUNTER — Other Ambulatory Visit: Payer: Self-pay | Admitting: Urology

## 2016-10-14 ENCOUNTER — Inpatient Hospital Stay (HOSPITAL_COMMUNITY)
Admission: RE | Admit: 2016-10-14 | Discharge: 2016-10-17 | DRG: 657 | Disposition: A | Discharge status: Home / Self Care | Payer: BLUE CROSS/BLUE SHIELD | Source: Ambulatory Visit | Attending: Urology | Admitting: Urology

## 2016-10-14 ENCOUNTER — Inpatient Hospital Stay (HOSPITAL_COMMUNITY): Payer: BLUE CROSS/BLUE SHIELD | Admitting: Registered Nurse

## 2016-10-14 DIAGNOSIS — N132 Hydronephrosis with renal and ureteral calculous obstruction: Secondary | ICD-10-CM | POA: Diagnosis present

## 2016-10-14 DIAGNOSIS — Z7982 Long term (current) use of aspirin: Secondary | ICD-10-CM | POA: Diagnosis not present

## 2016-10-14 DIAGNOSIS — Z9079 Acquired absence of other genital organ(s): Secondary | ICD-10-CM | POA: Diagnosis not present

## 2016-10-14 DIAGNOSIS — Z87442 Personal history of urinary calculi: Secondary | ICD-10-CM | POA: Diagnosis not present

## 2016-10-14 DIAGNOSIS — F17210 Nicotine dependence, cigarettes, uncomplicated: Secondary | ICD-10-CM | POA: Diagnosis present

## 2016-10-14 DIAGNOSIS — E875 Hyperkalemia: Secondary | ICD-10-CM | POA: Diagnosis present

## 2016-10-14 DIAGNOSIS — N200 Calculus of kidney: Secondary | ICD-10-CM

## 2016-10-14 DIAGNOSIS — N183 Chronic kidney disease, stage 3 (moderate): Secondary | ICD-10-CM | POA: Diagnosis present

## 2016-10-14 DIAGNOSIS — C661 Malignant neoplasm of right ureter: Principal | ICD-10-CM | POA: Diagnosis present

## 2016-10-14 DIAGNOSIS — Z936 Other artificial openings of urinary tract status: Secondary | ICD-10-CM

## 2016-10-14 DIAGNOSIS — R8271 Bacteriuria: Secondary | ICD-10-CM | POA: Diagnosis present

## 2016-10-14 DIAGNOSIS — Z79899 Other long term (current) drug therapy: Secondary | ICD-10-CM

## 2016-10-14 DIAGNOSIS — K219 Gastro-esophageal reflux disease without esophagitis: Secondary | ICD-10-CM | POA: Diagnosis present

## 2016-10-14 DIAGNOSIS — I129 Hypertensive chronic kidney disease with stage 1 through stage 4 chronic kidney disease, or unspecified chronic kidney disease: Secondary | ICD-10-CM | POA: Diagnosis present

## 2016-10-14 DIAGNOSIS — N209 Urinary calculus, unspecified: Secondary | ICD-10-CM

## 2016-10-14 DIAGNOSIS — I252 Old myocardial infarction: Secondary | ICD-10-CM

## 2016-10-14 DIAGNOSIS — I2510 Atherosclerotic heart disease of native coronary artery without angina pectoris: Secondary | ICD-10-CM | POA: Diagnosis present

## 2016-10-14 DIAGNOSIS — Z955 Presence of coronary angioplasty implant and graft: Secondary | ICD-10-CM | POA: Diagnosis not present

## 2016-10-14 DIAGNOSIS — C679 Malignant neoplasm of bladder, unspecified: Secondary | ICD-10-CM | POA: Diagnosis present

## 2016-10-14 HISTORY — PX: NEPHROLITHOTOMY: SHX5134

## 2016-10-14 LAB — HEMOGLOBIN AND HEMATOCRIT, BLOOD
HCT: 37.4 % — ABNORMAL LOW (ref 39.0–52.0)
Hemoglobin: 12.3 g/dL — ABNORMAL LOW (ref 13.0–17.0)

## 2016-10-14 SURGERY — NEPHROLITHOTOMY PERCUTANEOUS
Anesthesia: General | Site: Ureter | Laterality: Right

## 2016-10-14 MED ORDER — PHENYLEPHRINE 40 MCG/ML (10ML) SYRINGE FOR IV PUSH (FOR BLOOD PRESSURE SUPPORT)
PREFILLED_SYRINGE | INTRAVENOUS | Status: AC
  Filled 2016-10-14: qty 10

## 2016-10-14 MED ORDER — SODIUM CHLORIDE 0.9 % IR SOLN
Status: DC | PRN
  Administered 2016-10-14: 6000 mL

## 2016-10-14 MED ORDER — HYDROMORPHONE HCL 2 MG/ML IJ SOLN
INTRAMUSCULAR | Status: AC
  Filled 2016-10-14: qty 1

## 2016-10-14 MED ORDER — FENTANYL CITRATE (PF) 100 MCG/2ML IJ SOLN
INTRAMUSCULAR | Status: DC | PRN
  Administered 2016-10-14: 50 ug via INTRAVENOUS
  Administered 2016-10-14: 100 ug via INTRAVENOUS
  Administered 2016-10-14 (×3): 50 ug via INTRAVENOUS

## 2016-10-14 MED ORDER — ROCURONIUM BROMIDE 50 MG/5ML IV SOSY
PREFILLED_SYRINGE | INTRAVENOUS | Status: AC
  Filled 2016-10-14: qty 5

## 2016-10-14 MED ORDER — AMIODARONE HCL 200 MG PO TABS
200.0000 mg | ORAL_TABLET | Freq: Every day | ORAL | Status: DC
  Administered 2016-10-15 – 2016-10-16 (×2): 200 mg via ORAL
  Filled 2016-10-14 (×2): qty 1

## 2016-10-14 MED ORDER — LIDOCAINE 2% (20 MG/ML) 5 ML SYRINGE
INTRAMUSCULAR | Status: AC
  Filled 2016-10-14: qty 5

## 2016-10-14 MED ORDER — MEPERIDINE HCL 50 MG/ML IJ SOLN: 6.2500 mg | INTRAMUSCULAR | Status: DC | PRN

## 2016-10-14 MED ORDER — OXYCODONE HCL 5 MG PO TABS
10.0000 mg | ORAL_TABLET | ORAL | Status: DC | PRN
  Administered 2016-10-14 – 2016-10-15 (×2): 10 mg via ORAL
  Filled 2016-10-14 (×2): qty 2

## 2016-10-14 MED ORDER — MIDAZOLAM HCL 2 MG/2ML IJ SOLN
INTRAMUSCULAR | Status: AC
  Filled 2016-10-14: qty 2

## 2016-10-14 MED ORDER — ROCURONIUM BROMIDE 100 MG/10ML IV SOLN
INTRAVENOUS | Status: DC | PRN
  Administered 2016-10-14: 50 mg via INTRAVENOUS

## 2016-10-14 MED ORDER — SODIUM CHLORIDE 0.9 % IV SOLN
INTRAVENOUS | Status: DC | PRN
  Administered 2016-10-14: 14:00:00 via INTRAVENOUS

## 2016-10-14 MED ORDER — ONDANSETRON HCL 4 MG/2ML IJ SOLN
INTRAMUSCULAR | Status: DC | PRN
  Administered 2016-10-14: 4 mg via INTRAVENOUS

## 2016-10-14 MED ORDER — SENNOSIDES-DOCUSATE SODIUM 8.6-50 MG PO TABS
1.0000 | ORAL_TABLET | Freq: Two times a day (BID) | ORAL | Status: DC
  Administered 2016-10-14 – 2016-10-16 (×5): 1 via ORAL
  Filled 2016-10-14 (×5): qty 1

## 2016-10-14 MED ORDER — HYDROMORPHONE HCL 1 MG/ML IJ SOLN
INTRAMUSCULAR | Status: AC
  Filled 2016-10-14: qty 1

## 2016-10-14 MED ORDER — DEXTROSE 5 % IV SOLN
2.0000 g | INTRAVENOUS | Status: AC
  Administered 2016-10-14: 2 g via INTRAVENOUS
  Filled 2016-10-14: qty 2

## 2016-10-14 MED ORDER — SUFENTANIL CITRATE 50 MCG/ML IV SOLN: INTRAVENOUS | Status: DC | PRN

## 2016-10-14 MED ORDER — ONDANSETRON HCL 4 MG/2ML IJ SOLN: 4.0000 mg | Freq: Once | INTRAMUSCULAR | Status: DC | PRN

## 2016-10-14 MED ORDER — LIDOCAINE HCL (CARDIAC) 20 MG/ML IV SOLN
INTRAVENOUS | Status: DC | PRN
  Administered 2016-10-14: 50 mg via INTRAVENOUS
  Administered 2016-10-14: 100 mg via INTRAVENOUS

## 2016-10-14 MED ORDER — IOHEXOL 300 MG/ML  SOLN
INTRAMUSCULAR | Status: DC | PRN
  Administered 2016-10-14: 24 mL

## 2016-10-14 MED ORDER — LACTATED RINGERS IV SOLN
INTRAVENOUS | Status: DC
  Administered 2016-10-14: 12:00:00 via INTRAVENOUS

## 2016-10-14 MED ORDER — SUGAMMADEX SODIUM 200 MG/2ML IV SOLN
INTRAVENOUS | Status: DC | PRN
  Administered 2016-10-14: 200 mg via INTRAVENOUS

## 2016-10-14 MED ORDER — ONDANSETRON HCL 4 MG/2ML IJ SOLN
INTRAMUSCULAR | Status: AC
  Filled 2016-10-14: qty 2

## 2016-10-14 MED ORDER — ATORVASTATIN CALCIUM 40 MG PO TABS
40.0000 mg | ORAL_TABLET | Freq: Every day | ORAL | Status: DC
  Administered 2016-10-15 – 2016-10-16 (×2): 40 mg via ORAL
  Filled 2016-10-14 (×2): qty 1

## 2016-10-14 MED ORDER — PHENYLEPHRINE HCL 10 MG/ML IJ SOLN
INTRAMUSCULAR | Status: AC
  Filled 2016-10-14: qty 1

## 2016-10-14 MED ORDER — HYDROMORPHONE HCL 1 MG/ML IJ SOLN
0.2500 mg | INTRAMUSCULAR | Status: DC | PRN
  Administered 2016-10-14 (×2): 0.5 mg via INTRAVENOUS

## 2016-10-14 MED ORDER — SODIUM CHLORIDE 0.9 % IV SOLN
INTRAVENOUS | Status: DC
  Administered 2016-10-14 – 2016-10-16 (×2): via INTRAVENOUS

## 2016-10-14 MED ORDER — CIPROFLOXACIN IN D5W 200 MG/100ML IV SOLN
200.0000 mg | Freq: Once | INTRAVENOUS | Status: AC
  Administered 2016-10-14: 200 mg via INTRAVENOUS
  Filled 2016-10-14: qty 100

## 2016-10-14 MED ORDER — SUCCINYLCHOLINE CHLORIDE 200 MG/10ML IV SOSY
PREFILLED_SYRINGE | INTRAVENOUS | Status: AC
  Filled 2016-10-14: qty 10

## 2016-10-14 MED ORDER — HYDROMORPHONE HCL 1 MG/ML IJ SOLN
0.2500 mg | INTRAMUSCULAR | Status: DC | PRN
  Administered 2016-10-14 (×4): 0.5 mg via INTRAVENOUS

## 2016-10-14 MED ORDER — MIDAZOLAM HCL 5 MG/5ML IJ SOLN
INTRAMUSCULAR | Status: DC | PRN
  Administered 2016-10-14: 2 mg via INTRAVENOUS

## 2016-10-14 MED ORDER — FENTANYL CITRATE (PF) 100 MCG/2ML IJ SOLN
INTRAMUSCULAR | Status: AC
  Filled 2016-10-14: qty 4

## 2016-10-14 MED ORDER — ACETAMINOPHEN 500 MG PO TABS
1000.0000 mg | ORAL_TABLET | Freq: Three times a day (TID) | ORAL | Status: AC
  Administered 2016-10-14 – 2016-10-15 (×3): 1000 mg via ORAL
  Filled 2016-10-14 (×3): qty 2

## 2016-10-14 MED ORDER — FENTANYL CITRATE (PF) 100 MCG/2ML IJ SOLN
INTRAMUSCULAR | Status: AC
  Filled 2016-10-14: qty 2

## 2016-10-14 MED ORDER — PROPOFOL 10 MG/ML IV BOLUS
INTRAVENOUS | Status: AC
  Filled 2016-10-14: qty 40

## 2016-10-14 MED ORDER — HYDROMORPHONE HCL 1 MG/ML IJ SOLN
INTRAMUSCULAR | Status: DC | PRN
  Administered 2016-10-14 (×2): 1 mg via INTRAVENOUS

## 2016-10-14 MED ORDER — DEXAMETHASONE SODIUM PHOSPHATE 10 MG/ML IJ SOLN
INTRAMUSCULAR | Status: AC
  Filled 2016-10-14: qty 1

## 2016-10-14 MED ORDER — HYDROMORPHONE HCL 1 MG/ML IJ SOLN
0.5000 mg | INTRAMUSCULAR | Status: DC | PRN
Start: 2016-10-14 — End: 2016-10-17
  Administered 2016-10-14 – 2016-10-16 (×5): 1 mg via INTRAVENOUS
  Filled 2016-10-14 (×3): qty 1

## 2016-10-14 MED ORDER — PROPOFOL 10 MG/ML IV BOLUS
INTRAVENOUS | Status: DC | PRN
  Administered 2016-10-14: 200 mg via INTRAVENOUS

## 2016-10-14 SURGICAL SUPPLY — 70 items
APL SKNCLS STERI-STRIP NONHPOA (GAUZE/BANDAGES/DRESSINGS) ×4
BAG URINE DRAINAGE (UROLOGICAL SUPPLIES) ×8 IMPLANT
BAG URO CATCHER STRL LF (MISCELLANEOUS) ×4 IMPLANT
BASKET LASER NITINOL 1.9FR (BASKET) ×4 IMPLANT
BASKET ZERO TIP NITINOL 2.4FR (BASKET) ×4 IMPLANT
BENZOIN TINCTURE PRP APPL 2/3 (GAUZE/BANDAGES/DRESSINGS) ×8 IMPLANT
BLADE SURG 15 STRL LF DISP TIS (BLADE) ×2 IMPLANT
BLADE SURG 15 STRL SS (BLADE) ×4
BSKT STON RTRVL 120 1.9FR (BASKET) ×2
BSKT STON RTRVL ZERO TP 2.4FR (BASKET) ×2
CATCHER STONE W/TUBE ADAPTER (UROLOGICAL SUPPLIES) ×1 IMPLANT
CATH FOLEY 2W COUNCIL 20FR 5CC (CATHETERS) IMPLANT
CATH FOLEY 2WAY SLVR  5CC 16FR (CATHETERS)
CATH FOLEY 2WAY SLVR 5CC 16FR (CATHETERS) ×1 IMPLANT
CATH IMAGER II 65CM (CATHETERS) ×4 IMPLANT
CATH INTERMIT  6FR 70CM (CATHETERS) ×4 IMPLANT
CATH MULTI PURPOSE 16FR DRAIN (STENTS) ×4 IMPLANT
CATH ROBINSON RED A/P 20FR (CATHETERS) IMPLANT
CATH ULTRATHANE 14FR (CATHETERS) ×7 IMPLANT
CATH X-FORCE N30 NEPHROSTOMY (TUBING) ×4 IMPLANT
CHLORAPREP W/TINT 26ML (MISCELLANEOUS) ×5 IMPLANT
COVER SURGICAL LIGHT HANDLE (MISCELLANEOUS) ×4 IMPLANT
DRAPE C-ARM 42X120 X-RAY (DRAPES) ×4 IMPLANT
DRAPE LINGEMAN PERC (DRAPES) ×4 IMPLANT
DRAPE SHEET LG 3/4 BI-LAMINATE (DRAPES) ×4 IMPLANT
DRAPE SURG IRRIG POUCH 19X23 (DRAPES) ×4 IMPLANT
DRSG PAD ABDOMINAL 8X10 ST (GAUZE/BANDAGES/DRESSINGS) ×8 IMPLANT
DRSG TEGADERM 8X12 (GAUZE/BANDAGES/DRESSINGS) ×5 IMPLANT
DRSG TELFA 3X8 NADH (GAUZE/BANDAGES/DRESSINGS) ×4 IMPLANT
FIBER LASER FLEXIVA 1000 (UROLOGICAL SUPPLIES) IMPLANT
FIBER LASER FLEXIVA 365 (UROLOGICAL SUPPLIES) IMPLANT
FIBER LASER FLEXIVA 550 (UROLOGICAL SUPPLIES) IMPLANT
FIBER LASER TRAC TIP (UROLOGICAL SUPPLIES) IMPLANT
GAUZE SPONGE 4X4 12PLY STRL (GAUZE/BANDAGES/DRESSINGS) ×4 IMPLANT
GLOVE BIOGEL M STRL SZ7.5 (GLOVE) ×12 IMPLANT
GOWN STRL REUS W/TWL LRG LVL3 (GOWN DISPOSABLE) ×8 IMPLANT
GUIDEWIRE AMPLAZ .035X145 (WIRE) ×8 IMPLANT
GUIDEWIRE ANG ZIPWIRE 038X150 (WIRE) ×8 IMPLANT
GUIDEWIRE STR DUAL SENSOR (WIRE) IMPLANT
IV SET EXTENSION CATH 6 NF (IV SETS) ×1 IMPLANT
KIT BASIN OR (CUSTOM PROCEDURE TRAY) ×4 IMPLANT
MANIFOLD NEPTUNE II (INSTRUMENTS) ×4 IMPLANT
NDL TROCAR 18X15 ECHO (NEEDLE) IMPLANT
NDL TROCAR 18X20 (NEEDLE) IMPLANT
NEEDLE TROCAR 18X15 ECHO (NEEDLE) IMPLANT
NEEDLE TROCAR 18X20 (NEEDLE) IMPLANT
NS IRRIG 1000ML POUR BTL (IV SOLUTION) ×4 IMPLANT
PACK CYSTO (CUSTOM PROCEDURE TRAY) ×4 IMPLANT
PAD ABD 8X10 STRL (GAUZE/BANDAGES/DRESSINGS) ×3 IMPLANT
PAD DRESSING TELFA 3X8 NADH (GAUZE/BANDAGES/DRESSINGS) IMPLANT
PROBE LITHOCLAST ULTRA 3.8X403 (UROLOGICAL SUPPLIES) IMPLANT
PROBE PNEUMATIC 1.0MMX570MM (UROLOGICAL SUPPLIES) ×4 IMPLANT
SET IRRIG Y TYPE TUR BLADDER L (SET/KITS/TRAYS/PACK) ×4 IMPLANT
SHEATH ACCESS URETERAL 24CM (SHEATH) ×3 IMPLANT
SHEATH ACCESS URETERAL 38CM (SHEATH) ×3 IMPLANT
SHEATH PEELAWAY SET 9 (SHEATH) ×4 IMPLANT
SPONGE LAP 4X18 X RAY DECT (DISPOSABLE) ×4 IMPLANT
STONE CATCHER W/TUBE ADAPTER (UROLOGICAL SUPPLIES) ×4 IMPLANT
SUT SILK 2 0 30  PSL (SUTURE) ×2
SUT SILK 2 0 30 PSL (SUTURE) ×2 IMPLANT
SYR 10ML LL (SYRINGE) ×4 IMPLANT
SYR 20CC LL (SYRINGE) ×8 IMPLANT
SYR 50ML LL SCALE MARK (SYRINGE) ×4 IMPLANT
TOWEL OR 17X26 10 PK STRL BLUE (TOWEL DISPOSABLE) ×4 IMPLANT
TUBE CONNECTING VINYL 14FR 30C (MISCELLANEOUS) ×7 IMPLANT
TUBE FEEDING 8FR 16IN STR KANG (MISCELLANEOUS) ×4 IMPLANT
TUBING CONNECTING 10 (TUBING) ×9 IMPLANT
TUBING CONNECTING 10' (TUBING) ×3
WATER STERILE IRR 1500ML POUR (IV SOLUTION) ×4 IMPLANT
WATER STERILE IRR 3000ML UROMA (IV SOLUTION) ×4 IMPLANT

## 2016-10-14 NOTE — Transfer of Care (Signed)
Immediate Anesthesia Transfer of Care Note  Patient: [name]  Procedure(s) Performed: Procedure(s): 1ST STAGE NEPHROLITHOTOMY PERCUTANEOUS ureteroscopy with stone basket and ureter biopsy right (Right) HOLMIUM LASER APPLICATION (Right)  Patient Location: PACU  Anesthesia Type:General  Level of Consciousness: awake, alert , oriented and patient cooperative  Airway & Oxygen Therapy: Patient Spontanous Breathing and Patient connected to face mask oxygen  Post-op Assessment: Report given to RN, Post -op Vital signs reviewed and stable and Patient moving all extremities X 4  Post vital signs: stable  Last Vitals:  Vitals:   10/14/16 1500 10/14/16 1502  BP: (!) 133/102 (!) 132/102  Pulse: 76 78  Resp: 10 14  Temp: 36.9 C     Last Pain:  Vitals:   10/14/16 1500  TempSrc:   PainSc: 7       Patients Stated Pain Goal: 5 ([phone] [id number])  Complications: No apparent anesthesia complications

## 2016-10-14 NOTE — Anesthesia Postprocedure Evaluation (Signed)
Anesthesia Post Note  Patient: Darren Carr  Procedure(s) Performed: Procedure(s) (LRB): 1ST STAGE NEPHROLITHOTOMY PERCUTANEOUS ureteroscopy with stone basket and ureter biopsy right (Right) HOLMIUM LASER APPLICATION (Right)  Patient location during evaluation: PACU Anesthesia Type: General Level of consciousness: awake and alert Pain management: pain level controlled Vital Signs Assessment: post-procedure vital signs reviewed and stable Respiratory status: spontaneous breathing, nonlabored ventilation, respiratory function stable and patient connected to nasal cannula oxygen Cardiovascular status: blood pressure returned to baseline and stable Postop Assessment: no signs of nausea or vomiting Anesthetic complications: no       Last Vitals:  Vitals:   10/14/16 1600 10/14/16 1615  BP: 120/87 (!) 149/89  Pulse: 61 60  Resp: (!) 8 12  Temp: 36.4 C 36.7 C    Last Pain:  Vitals:   10/14/16 1848  TempSrc:   PainSc: [place]                 Batoul Limes DAVID

## 2016-10-14 NOTE — Anesthesia Procedure Notes (Signed)
Procedure Name: Intubation Date/Time: 10/14/2016 1:08 PM Performed by: Lissa Morales Pre-anesthesia Checklist: Patient identified, Emergency Drugs available, Suction available and Patient being monitored Patient Re-evaluated:Patient Re-evaluated prior to inductionOxygen Delivery Method: Circle system utilized Preoxygenation: Pre-oxygenation with 100% oxygen Intubation Type: IV induction Ventilation: Mask ventilation without difficulty Laryngoscope Size: Mac and 4 Grade View: Grade II Tube type: Oral Tube size: 8.0 mm Number of attempts: 1 Airway Equipment and Method: Stylet and Oral airway Placement Confirmation: ETT inserted through vocal cords under direct vision,  positive ETCO2 and breath sounds checked- equal and bilateral Secured at: 23 cm Tube secured with: Tape Dental Injury: Teeth and Oropharynx as per pre-operative assessment

## 2016-10-14 NOTE — Brief Op Note (Signed)
10/14/2016  2:41 PM  PATIENT:  Darren Carr  66 y.o. male  PRE-OPERATIVE DIAGNOSIS:  RIGHT PARTIAL STAGHORN KIDNEY STONE  POST-OPERATIVE DIAGNOSIS:  right partial staghorn kidney stone  PROCEDURE:  Rt 1st stage Percutantous nephrostolithotomy, Rt ureteroscopy with biopsy, Rt antegrade nephrostogram, Rt nephrostomy tube exchange, Rt nephroureteral stent placement  SURGEON:  Surgeon(s) and Role:    * [name], MD - Primary  PHYSICIAN ASSISTANT:   ASSISTANTS: none   ANESTHESIA:   general  EBL:  Total I/O In: 1000 [I.V.:1000] Out: 150 [Blood:150]  BLOOD ADMINISTERED:none  DRAINS: 1 - Urostomy to gravity, 2 - Rt nephrostomy to gravity, 3 - Rt nephroureteral stent (capped)   LOCAL MEDICATIONS USED:  NONE  SPECIMEN:  Source of Specimen:  1 - Rt ureteral / renal stones , 2 - Rt ureteral tumor   DISPOSITION OF SPECIMEN:  stones to pt, tumor to pathology  COUNTS:  YES  TOURNIQUET:  * No tourniquets in log *  DICTATION: .Other Dictation: Dictation Number [id number]  PLAN OF CARE: Admit to inpatient   PATIENT DISPOSITION:  PACU - hemodynamically stable.   Delay start of Pharmacological VTE agent (>24hrs) due to surgical blood loss or risk of bleeding: yes

## 2016-10-14 NOTE — Anesthesia Preprocedure Evaluation (Addendum)
Anesthesia Evaluation  Patient identified by MRN, date of birth, ID band Patient awake    Reviewed: Allergy & Precautions, NPO status , Patient's Chart, lab work & pertinent test results  Airway Mallampati: I  TM Distance: >3 FB Neck ROM: Full    Dental   Pulmonary Current Smoker,    Pulmonary exam normal        Cardiovascular hypertension, Pt. on medications + CAD and + Past MI  Normal cardiovascular exam     Neuro/Psych    GI/Hepatic GERD  Medicated and Controlled,  Endo/Other    Renal/GU Renal InsufficiencyRenal disease     Musculoskeletal   Abdominal   Peds  Hematology   Anesthesia Other Findings   Reproductive/Obstetrics                            Anesthesia Physical Anesthesia Plan  ASA: III  Anesthesia Plan: General   Post-op Pain Management:    Induction: Intravenous  Airway Management Planned: Oral ETT  Additional Equipment:   Intra-op Plan:   Post-operative Plan: Extubation in OR  Informed Consent: I have reviewed the patients History and Physical, chart, labs and discussed the procedure including the risks, benefits and alternatives for the proposed anesthesia with the patient or authorized representative who has indicated his/her understanding and acceptance.     Plan Discussed with: CRNA and Surgeon  Anesthesia Plan Comments:         Anesthesia Quick Evaluation

## 2016-10-14 NOTE — H&P (Signed)
Chief Complaint: Pre-op RIGHT 1st stage percutnateous nephrostolithotomy  HPI:  1 - Metastatic Bladder Cancer - s/p robotic cystoprostatectomy with ICG sentinel + template pelvic lymphadenectomy and ileal conduit urinary diversion on 01/16/15 for [id number] urothelial carcinoma. Not candidate for adjuvant chemo per medical oncology. Most recent imaging s/o recurrence.     2 - Left Hydronephrosis / Right Renal Atrophy / Stage 3 Renal Insufficiency - had right hydro from bladder cancer obstrucitng Rt UO pre-cystectomy and partial Rt renal atrophy. New Left hydro post-cystectomy and neph tube placed 02/2015 (left Bander stent "fell out" early). Most recent Cr 2-3 and managing with externalized nephro-ureteral stent [id number].   3 - Nephrolithiasis / New RIGHT Hydronephrosis - new right ureteral stone with new right hydro by cancer surveillance CT 08/10/16. Also very rapid increase in stone volume on right. Rt neph tube placed 07/2016 and resolved mild hyperkalemia.   4 - Bacteruria - chronic bacteruria as expected with conduit diversion. Most recent CX's 07/2016 polyclonal sens Cipro.   Today "Darren Carr" is seen to proceed with RIGHT 1st stage PCNL with goal to get him eventually tube free on right side. No interval fevers.      Past Medical History:  Diagnosis Date  . Acute MI, lateral wall ([place]) 02/23/2015  . Alcoholic ([place])   . Anterior wall myocardial infarction ([place]) 02/23/2015  . Bladder cancer ([place]) 07/2014  . Chronic back pain   . Coronary artery disease   . GERD (gastroesophageal reflux disease)   . History of blood transfusion    "related to bleeding ulcers on my esophagus"  . History of kidney stones   . Hypertension   . Kidney stones   . Upper GI bleed         Past Surgical History:  Procedure Laterality Date  . APPENDECTOMY    . BALLOON DILATION N/A 08/16/2013   Procedure: BALLOON DILATION to 16.56mm; Surgeon: Rogene Houston, MD; Location: [facility]; Service: Endoscopy; Laterality: N/A;  .  BIOPSY  08/16/2013   Procedure: DISTAL ESOPHAGEAL BIOPSIES; Surgeon: Rogene Houston, MD; Location: [facility]; Service: Endoscopy;;  . CARDIAC CATHETERIZATION N/A 02/23/2015   Procedure: Left Heart Cath and Coronary Angiography; Surgeon: Charolette Forward, MD; Location: [place] [facility]; Service: Cardiovascular; Laterality: N/A;  . CARDIAC CATHETERIZATION N/A 04/25/2015   Procedure: Coronary Stent Intervention; Surgeon: Charolette Forward, MD; Location: [place] [facility]; Service: Cardiovascular; Laterality: N/A;  . CORONARY ANGIOPLASTY    . CYSTOSCOPY W/ URETERAL STENT PLACEMENT Bilateral 12/05/2014   Procedure: CYSTOSCOPY WITH BILATERAL RETROGRADE PYELOGRAM; Surgeon: [name], MD; Location: [facility]; Service: Urology; Laterality: Bilateral;  . CYSTOSCOPY WITH INJECTION N/A 01/16/2015   Procedure: CYSTOSCOPY WITH INJECTION OF INDOCYANINE GREEN DYE; Surgeon: [name], MD; Location: [facility]; Service: Urology; Laterality: N/A;  . ESOPHAGOGASTRODUODENOSCOPY N/A 02/24/2015   Procedure: ESOPHAGOGASTRODUODENOSCOPY (EGD); Surgeon: Wilford Corner, MD; Location[facility] [address] [facility]; Service: Endoscopy; Laterality: N/A;  . ESOPHAGOGASTRODUODENOSCOPY (EGD) WITH PROPOFOL N/A 08/16/2013   Procedure: ESOPHAGOGASTRODUODENOSCOPY (EGD) WITH PROPOFOL Hiatus at 40cm Gastroesophageal Junction at 37cm; Surgeon: Rogene Houston, MD; Location: [facility]; Service: Endoscopy; Laterality: N/A;  . IR GENERIC HISTORICAL  04/29/2016   IR CATHETER TUBE CHANGE 04/29/2016 [facility]  . IR GENERIC HISTORICAL  06/10/2016   IR CATHETER TUBE CHANGE 06/10/2016 Jacqulynn Cadet, MD [facility]  . IR GENERIC HISTORICAL  07/15/2016   IR CATHETER TUBE CHANGE 07/15/2016 Marybelle Killings, MD [facility]  . IR GENERIC HISTORICAL  08/10/2016   IR NEPHROSTOMY PLACEMENT RIGHT 08/10/2016 Aletta Edouard, MD  [facility]  . IR GENERIC HISTORICAL  08/11/2016   IR NEPHROSTOMY TUBE CHANGE 08/11/2016 Arne Cleveland, MD [facility]  . IR GENERIC HISTORICAL   09/08/2016   IR NEPHROSTOMY EXCHANGE RIGHT 09/08/2016 Marybelle Killings, MD [facility]  . IR GENERIC HISTORICAL  09/08/2016   IR CATHETER TUBE CHANGE 09/08/2016 Marybelle Killings, MD [facility]  . IR GENERIC HISTORICAL  10/13/2016   IR CATHETER TUBE CHANGE 10/13/2016 Jacqulynn Cadet, MD [facility]  . LYMPHADENECTOMY Bilateral 01/16/2015   Procedure: PELVIC LYMPH NODE DISSECTION; Surgeon: [name], MD; Location: [facility]; Service: Urology; Laterality: Bilateral;  . ROBOT ASSISTED LAPAROSCOPIC COMPLETE CYSTECT ILEAL CONDUIT N/A 01/16/2015   Procedure: ROBOTIC ASSISTED LAPAROSCOPIC COMPLETE CYSTECT ILEAL CONDUIT/ROBOTIC ASSISTED LAPAROSCOPIC RADICAL PROSTATECTOMY; Surgeon: [name], MD; Location: [facility]; Service: Urology; Laterality: N/A;  . TRANSURETHRAL RESECTION OF BLADDER TUMOR WITH GYRUS (TURBT-GYRUS) N/A 12/05/2014   Procedure: TRANSURETHRAL RESECTION OF BLADDER TUMOR WITH GYRUS (TURBT-GYRUS); Surgeon: [name], MD; Location: [facility]; Service: Urology; Laterality: N/A;  . TRANSURETHRAL RESECTION OF PROSTATE  07-2014,08-2014,09-2014   No family history on file.  Social History: reports that he has been smoking Cigarettes. He has a 73.50 pack-year smoking history. He has never used smokeless tobacco. He reports that he does not drink alcohol or use drugs.  Allergies: No Known Allergies  No prescriptions prior to admission.   Lab Results Last 48 Hours                                                                                                                                                                                            Imaging Results (Last 48 hours)     Review of Systems  Constitutional: Negative. Negative for chills and fever.  HENT: Negative.  Eyes: Negative.  Respiratory: Negative.  Cardiovascular: Negative.  Gastrointestinal: Negative.  Genitourinary: Negative.  Musculoskeletal: Negative.  Skin: Negative.  Neurological:  Negative.  Endo/Heme/Allergies: Negative.  Psychiatric/Behavioral: Negative.   There were no vitals taken for this visit.  Physical Exam  Constitutional: He appears well-developed.  Thin, at baseline  HENT:  Head: Normocephalic.  Eyes: Pupils are equal, round, and reactive to light.  Neck: Normal range of motion.  Cardiovascular: Normal rate.  Respiratory: Effort normal.  GI: Soft.  Genitourinary:  Genitourinary Comments: RLQ Urostomy with nephroureteral stent in situe. Rt flank neph tube in situ with clear yellow urine.  Musculoskeletal: Normal range of motion.  Neurological: He is alert.  Skin: Skin is warm.  Psychiatric: He has a normal mood and affect.   Assessment/Plan  Proceed as planned with RT 1st stage PCNL today. Risks, benefits, alternatives, expected peri-op course  discussed previously and reiterated today. He has been on ABX according to most recent CX data to reduce colonization.

## 2016-10-14 NOTE — H&P (Signed)
Darren Carr is an 66 y.o. male.    Chief Complaint: Pre-op RIGHT 1st stage percutnateous nephrostolithotomy  HPI:   1 - Metastatic Bladder Cancer - s/p robotic cystoprostatectomy with ICG sentinel + template pelvic lymphadenectomy and ileal conduit urinary diversion on 01/16/15 for [id number] urothelial carcinoma. Not candidate for adjuvant chemo per medical oncology. Most recent imaging s/o recurrence.     2 - Left Hydronephrosis / Right Renal Atrophy / Stage 3 Renal Insufficiency - had right hydro from bladder cancer obstrucitng Rt UO pre-cystectomy and partial Rt renal atrophy. New Left hydro post-cystectomy and neph tube placed 02/2015 (left Bander stent "fell out" early). Most recent Cr 2-3 and managing with externalized nephro-ureteral stent 32F.   3 - Nephrolithiasis / New RIGHT Hydronephrosis - new right ureteral stone with new right hydro by cancer surveillance CT 08/10/16. Also very rapid increase in stone volume on right. Rt neph tube placed 07/2016 and resolved mild hyperkalemia.   4 - Bacteruria - chronic bacteruria as expected with conduit diversion. Most recent CX's 07/2016 polyclonal sens Cipro.   Today "Darren Carr" is seen to proceed with RIGHT 1st stage PCNL with goal to get him eventually tube free on right side. No interval fevers.    Past Medical History:  Diagnosis Date  . Acute MI, lateral wall ([place]) 02/23/2015  . Alcoholic ([place])   . 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 Bladder cancer ([facility]) 07/2014  . Chronic back pain   . Coronary artery disease   . GERD (gastroesophageal reflux disease)   . History of blood transfusion    "related to bleeding ulcers on my esophagus"  . History of kidney stones   . Hypertension   . Kidney stones   . Upper GI bleed     Past Surgical History:  Procedure Laterality Date  . APPENDECTOMY    . BALLOON DILATION N/A 08/16/2013   Procedure: BALLOON DILATION to 16.36m;  Surgeon: NRogene Houston MD;  Location: [facility];   Service: Endoscopy;  Laterality: N/A;  . BIOPSY  08/16/2013   Procedure: DISTAL ESOPHAGEAL BIOPSIES;  Surgeon: NRogene Houston MD;  Location: [facility];  Service: Endoscopy;;  . CARDIAC CATHETERIZATION N/A 02/23/2015   Procedure: Left Heart Cath and Coronary Angiography;  Surgeon: MCharolette Forward MD;  [facility];  Service: Cardiovascular;  Laterality: N/A;  . CARDIAC CATHETERIZATION N/A 04/25/2015   Procedure: Coronary Stent Intervention;  Surgeon: MCharolette Forward MD;  [facility];  Service: Cardiovascular;  Laterality: N/A;  . CORONARY ANGIOPLASTY    . CYSTOSCOPY W/ URETERAL STENT PLACEMENT Bilateral 12/05/2014   Procedure: CYSTOSCOPY WITH BILATERAL RETROGRADE PYELOGRAM;  Surgeon: TAlexis Frock MD;  [facility];  Service: Urology;  Laterality: Bilateral;  . CYSTOSCOPY WITH INJECTION N/A 01/16/2015   Procedure: CYSTOSCOPY WITH INJECTION OF INDOCYANINE GREEN DYE;  Surgeon: TAlexis Frock MD;  [facility]: [facility];  Service: Urology;  Laterality: N/A;  . ESOPHAGOGASTRODUODENOSCOPY N/A 02/24/2015   Procedure: ESOPHAGOGASTRODUODENOSCOPY (EGD);  Surgeon: VWilford Corner MD;  [facility];  Service: Endoscopy;  Laterality: N/A;  . ESOPHAGOGASTRODUODENOSCOPY (EGD) WITH PROPOFOL N/A 08/16/2013   Procedure: ESOPHAGOGASTRODUODENOSCOPY (EGD) WITH PROPOFOL Hiatus at 40cm Gastroesophageal Junction at 37cm;  Surgeon: NRogene Houston MD;  Location: [facility];  Service: Endoscopy;  Laterality: N/A;  . IR GENERIC HISTORICAL  04/29/2016   IR CATHETER TUBE CHANGE 04/29/2016 [facility]  . IR GENERIC HISTORICAL  06/10/2016   IR CATHETER TUBE CHANGE 06/10/2016 HJacqulynn Cadet MD [facility]  . IR GENERIC HISTORICAL  07/15/2016   IR CATHETER TUBE CHANGE 07/15/2016 Arthur Hoss, MD [facility]  . IR GENERIC HISTORICAL  08/10/2016   IR NEPHROSTOMY PLACEMENT RIGHT 08/10/2016 Glenn Yamagata, MD [facility]  . IR GENERIC HISTORICAL  08/11/2016   IR NEPHROSTOMY TUBE CHANGE  08/11/2016 Daniel Hassell, MD [facility]  . IR GENERIC HISTORICAL  09/08/2016   IR NEPHROSTOMY EXCHANGE RIGHT 09/08/2016 Arthur Hoss, MD [facility]  . IR GENERIC HISTORICAL  09/08/2016   IR CATHETER TUBE CHANGE 09/08/2016 Arthur Hoss, MD [facility]  . IR GENERIC HISTORICAL  10/13/2016   IR CATHETER TUBE CHANGE 10/13/2016 Heath McCullough, MD [facility]  . LYMPHADENECTOMY Bilateral 01/16/2015   Procedure: PELVIC LYMPH NODE DISSECTION;  Surgeon:  , MD;  [facility];  Service: Urology;  Laterality: Bilateral;  . ROBOT ASSISTED LAPAROSCOPIC COMPLETE CYSTECT ILEAL CONDUIT N/A 01/16/2015   Procedure: ROBOTIC ASSISTED LAPAROSCOPIC COMPLETE CYSTECT ILEAL CONDUIT/ROBOTIC ASSISTED LAPAROSCOPIC RADICAL PROSTATECTOMY;  Surgeon:  , MD;  [facility];  Service: Urology;  Laterality: N/A;  . TRANSURETHRAL RESECTION OF BLADDER TUMOR WITH GYRUS (TURBT-GYRUS) N/A 12/05/2014   Procedure: TRANSURETHRAL RESECTION OF BLADDER TUMOR WITH GYRUS (TURBT-GYRUS);  Surgeon:  , MD;  [facility];  Service: Urology;  Laterality: N/A;  . TRANSURETHRAL RESECTION OF PROSTATE  07-2014,08-2014,09-2014    No family history on file. Social History:  reports that he has been smoking Cigarettes.  He has a 73.50 pack-year smoking history. He has never used smokeless tobacco. He reports that he does not drink alcohol or use drugs.  Allergies: No Known Allergies  No prescriptions prior to admission.    Results for orders placed or performed during the hospital encounter of 10/13/16 (from the past 48 hour(s))  Basic metabolic panel     Status: Abnormal   Collection Time: 10/13/16  8:44 AM  Result Value Ref Range   Sodium 135 135 - 145 mmol/L   Potassium 5.0 3.5 - 5.1 mmol/L   Chloride 108 101 - 111 mmol/L   CO2 21 (L) 22 - 32 mmol/L   Glucose, Bld 76 65 - 99 mg/dL   BUN 24 (H) 6 - 20 mg/dL   Creatinine, Ser 2.84 (H) 0.61 - 1.24 mg/dL   Calcium 8.9 8.9 - 10.3 mg/dL    GFR calc non Af Amer 22 (L) >60 mL/min   GFR calc Af Amer 26 (L) >60 mL/min    Comment: (NOTE) The eGFR has been calculated using the CKD EPI equation. This calculation has not been validated in all clinical situations. eGFR's persistently <60 mL/min signify possible Chronic Kidney Disease.    Anion gap 6 5 - 15  CBC     Status: Abnormal   Collection Time: 10/13/16  8:44 AM  Result Value Ref Range   WBC 10.7 (H) 4.0 - 10.5 K/uL   RBC 4.63 4.22 - 5.81 MIL/uL   Hemoglobin 13.3 13.0 - 17.0 g/dL   HCT 40.6 39.0 - 52.0 %   MCV 87.7 78.0 - 100.0 fL   MCH 28.7 26.0 - 34.0 pg   MCHC 32.8 30.0 - 36.0 g/dL   RDW 15.9 (H) 11.5 - 15.5 %   Platelets 234 150 - 400 K/uL  PT- INR at PAT visit (Pre-admission Testing)     Status: None   Collection Time: 10/13/16  8:44 AM  Result Value Ref Range   Prothrombin Time 14.5 11.4 - 15.2 seconds   INR 1.12    Ir Catheter   Tube Change  Result Date: 10/13/2016 INDICATION: 66 year old male with a history of prior cystectomy and ileal conduit with distal left ureteral stricture. He presents for routine exchange. EXAM: IR CATHETER TUBE CHANGE COMPARISON:  None. MEDICATIONS: None ANESTHESIA/SEDATION: None CONTRAST:  10 mL Isovue-300 - administered into the collecting system(s) FLUOROSCOPY TIME:  Fluoroscopy Time: 0 minutes 24 seconds (7 mGy). COMPLICATIONS: None immediate. PROCEDURE: Informed written consent was obtained from the patient after a thorough discussion of the procedural risks, benefits and alternatives. All questions were addressed. Maximal Sterile Barrier Technique was utilized including caps, mask, sterile gowns, sterile gloves, sterile drape, hand hygiene and skin antiseptic. A timeout was performed prior to the initiation of the procedure. A gentle hand injection of contrast material was performed through the retrograde nephroureteral tube. The tube is well positioned with the locking loop in the renal pelvis. There is no evidence of hydronephrosis. The  tube is not occluded. The tube was cut and removed over an Amplatz wire. A new Cook 10.2 [place] all-purpose drainage catheter was then advanced over the wire and formed within the renal pelvis. The wire was removed. The catheter was injected with contrast to again confirmed its location. An image was saved for the electronic medical record. The patient tolerated the procedure well. IMPRESSION: Successful routine exchange of 10.2 French indwelling retrograde nephroureteral stent via right lower quadrant diverting ileostomy. Electronically Signed   By: Jacqulynn Cadet M.D.   On: 10/13/2016 12:45    Review of Systems  Constitutional: Negative.  Negative for chills and fever.  HENT: Negative.   Eyes: Negative.   Respiratory: Negative.   Cardiovascular: Negative.   Gastrointestinal: Negative.   Genitourinary: Negative.   Musculoskeletal: Negative.   Skin: Negative.   Neurological: Negative.   Endo/Heme/Allergies: Negative.   Psychiatric/Behavioral: Negative.     There were no vitals taken for this visit. Physical Exam  Constitutional: He appears well-developed.  Thin, at baseline  HENT:  Head: Normocephalic.  Eyes: Pupils are equal, round, and reactive to light.  Neck: Normal range of motion.  Cardiovascular: Normal rate.   Respiratory: Effort normal.  GI: Soft.  Genitourinary:  Genitourinary Comments: RLQ Urostomy with nephroureteral stent in situe. Rt flank neph tube in situ with clear yellow urine.   Musculoskeletal: Normal range of motion.  Neurological: He is alert.  Skin: Skin is warm.  Psychiatric: He has a normal mood and affect.     Assessment/Plan  Proceed as planned with RT 1st stage PCNL today. Risks, benefits, alternatives, expected peri-op course discussed previously and reiterated today. He has been on ABX according to most recent CX data to reduce colonization.   [name], MD 10/14/2016, 8:58 AM

## 2016-10-15 ENCOUNTER — Encounter (HOSPITAL_COMMUNITY): Payer: Self-pay | Admitting: Urology

## 2016-10-15 ENCOUNTER — Inpatient Hospital Stay (HOSPITAL_COMMUNITY): Payer: BLUE CROSS/BLUE SHIELD

## 2016-10-15 LAB — BASIC METABOLIC PANEL
ANION GAP: 6 (ref 5–15)
BUN: 24 mg/dL — ABNORMAL HIGH (ref 6–20)
CALCIUM: 7.8 mg/dL — AB (ref 8.9–10.3)
CHLORIDE: 110 mmol/L (ref 101–111)
CO2: 23 mmol/L (ref 22–32)
Creatinine, Ser: 2.72 mg/dL — ABNORMAL HIGH (ref 0.61–1.24)
GFR calc Af Amer: 27 mL/min — ABNORMAL LOW (ref >60)
GFR calc non Af Amer: 23 mL/min — ABNORMAL LOW (ref >60)
GLUCOSE: 90 mg/dL (ref 65–99)
Potassium: 4.2 mmol/L (ref 3.5–5.1)
Sodium: 139 mmol/L (ref 135–145)

## 2016-10-15 LAB — HEMOGLOBIN AND HEMATOCRIT, BLOOD
HCT: 34.4 % — ABNORMAL LOW (ref 39.0–52.0)
HEMOGLOBIN: 11.4 g/dL — AB (ref 13.0–17.0)

## 2016-10-15 MED ORDER — OXYCODONE HCL 5 MG PO TABS
20.0000 mg | ORAL_TABLET | ORAL | Status: DC | PRN
  Administered 2016-10-15 – 2016-10-17 (×9): 20 mg via ORAL
  Filled 2016-10-15 (×9): qty 4

## 2016-10-15 NOTE — Care Management Note (Signed)
Case Management Note  Patient Details  Name: Darren Carr MRN: [id number] Date of Birth: 07/11/1951  Subjective/Objective:  66 y/o m admitted w/staghorn ranal calculus. From homr.                  Action/Plan:d/c plan home.   Expected Discharge Date:                  Expected Discharge Plan:  Home/Self Care  In-House Referral:     Discharge planning Services  CM Consult  Post Acute Care Choice:    Choice offered to:     DME Arranged:    DME Agency:     HH Arranged:    HH Agency:     Status of Service:  In process, will continue to follow  If discussed at Long Length of Stay Meetings, dates discussed:    Additional Comments:  Dessa Phi, RN 10/15/2016, 11:08 AM

## 2016-10-15 NOTE — Progress Notes (Signed)
1 Day Post-Op  Subjective:  1 - Right Partial Staghorn Kidney Stone - s/p first stage right percutaneous nephrolstolithotomy on 10/14/16, the day of admission where vast majority of stone removed. CT 10/15/16 shows 33mm residual lower pole stone.  Today "Darren Carr" is stable. No fevers. Some pain as anticipated from nephrostomy tube and surgery yesterday. CT this AM with drastically improved but some persistant lower pole stone that appears to be in acutely angled calyx to access tract.   Objective: Vital signs in last 24 hours: Temp:  [97.6 F (36.4 C)-98.4 F (36.9 C)] 98.2 F (36.8 C) (02/01 0519) Pulse Rate:  [60-78] 62 (02/01 0519) Resp:  [8-16] 14 (02/01 0519) BP: (101-149)/(69-102) 106/73 (02/01 0519) SpO2:  [95 %-100 %] 97 % (02/01 0519) Weight:  [73.5 kg (162 lb)] 73.5 kg (162 lb) (01/31 1147) Last BM Date: 10/13/16  Intake/Output from previous day: 01/31 0701 - 02/01 0700 In: 2898.3 [P.O.:240; I.V.:2658.3] Out: 1005 [Urine:855; Blood:150] Intake/Output this shift: No intake/output data recorded.  General appearance: alert NAD, wife at bedside No wheezes on [place] O2 SNTND Stable RLQ urostomy with very light pink urine and distal end of nephroureteral stent. Rt flank with neph tube and very light pink urine, no hematomas / ecchymoses. No c/c/e  Lab Results:   Recent Labs  10/13/16 0844 10/14/16 1524 10/15/16 0448  WBC 10.7*  --   --   HGB 13.3 12.3* 11.4*  HCT 40.6 37.4* 34.4*  PLT 234  --   --    BMET  Recent Labs  10/13/16 0844 10/15/16 0448  NA 135 139  K 5.0 4.2  CL 108 110  CO2 21* 23  GLUCOSE 76 90  BUN 24* 24*  CREATININE 2.84* 2.72*  CALCIUM 8.9 7.8*   PT/INR  Recent Labs  10/13/16 0844  LABPROT 14.5  INR 1.12   ABG No results for input(s): PHART, HCO3 in the last 72 hours.  Invalid input(s): PCO2, PO2  Studies/Results: Dg Abd 1 View  Result Date: 10/14/2016 CLINICAL DATA:  Percutaneous nephrolithotomy . EXAM: ABDOMEN - 1 VIEW; DG C-ARM  61-120 MIN-NO REPORT COMPARISON:  10/13/2016 . FINDINGS: Images from an intraoperative percutaneous nephrolithotomy present. Contrast in the renal collecting system. Ureteral stent noted in the renal collecting system. Nephrostomy catheter renal collecting system. IMPRESSION: Postsurgical changes. Nephrostomy tube and ureteral stent appear to be in good anatomic position. Electronically Signed   By: Marcello Moores  Register   On: 10/14/2016 15:24   Ir Catheter Tube Change  Result Date: 10/13/2016 INDICATION: 66 year old male with a history of prior cystectomy and ileal conduit with distal left ureteral stricture. He presents for routine exchange. EXAM: IR CATHETER TUBE CHANGE COMPARISON:  None. MEDICATIONS: None ANESTHESIA/SEDATION: None CONTRAST:  10 mL Isovue-300 - administered into the collecting system(s) FLUOROSCOPY TIME:  Fluoroscopy Time: 0 minutes 24 seconds (7 mGy). COMPLICATIONS: None immediate. PROCEDURE: Informed written consent was obtained from the patient after a thorough discussion of the procedural risks, benefits and alternatives. All questions were addressed. Maximal Sterile Barrier Technique was utilized including caps, mask, sterile gowns, sterile gloves, sterile drape, hand hygiene and skin antiseptic. A timeout was performed prior to the initiation of the procedure. A gentle hand injection of contrast material was performed through the retrograde nephroureteral tube. The tube is well positioned with the locking loop in the renal pelvis. There is no evidence of hydronephrosis. The tube is not occluded. The tube was cut and removed over an Amplatz wire. A new Cook 10.2 [place] all-purpose drainage  catheter was then advanced over the wire and formed within the renal pelvis. The wire was removed. The catheter was injected with contrast to again confirmed its location. An image was saved for the electronic medical record. The patient tolerated the procedure well. IMPRESSION: Successful routine exchange  of 10.2 French indwelling retrograde nephroureteral stent via right lower quadrant diverting ileostomy. Electronically Signed   By: Jacqulynn Cadet M.D.   On: 10/13/2016 12:45   Dg C-arm 61-120 Min-no Report  Result Date: 10/14/2016 Fluoroscopy was utilized by the requesting physician.  No radiographic interpretation.   Ct Renal Stone Study  Result Date: 10/15/2016 CLINICAL DATA:  Status post left percutaneous nephrolithotomy. EXAM: CT ABDOMEN AND PELVIS WITHOUT CONTRAST TECHNIQUE: Multidetector CT imaging of the abdomen and pelvis was performed following the standard protocol without IV contrast. COMPARISON:  08/10/2016 FINDINGS: Lower chest: Lung bases again demonstrate persistent scarring bilaterally. No acute infiltrate or effusion is noted. Hepatobiliary: The gallbladder is decompressed. The liver is within normal limits. Pancreas: Unremarkable. No pancreatic ductal dilatation or surrounding inflammatory changes. Spleen: Normal in size without focal abnormality. Adrenals/Urinary Tract: The adrenal glands are within normal limits bilaterally. A retrograde nephroureteral catheter is noted on the left extending into a right-sided ileostomy. Scattered small stone fragments are noted but improved when compared with the prior exam. The largest of these lies in the lower pole measuring 7 mm in greatest dimension. This is slightly decreased from 9.5 mm on the prior exam. No definitive ureteral stone is seen. On the right, there are changes consistent with the recent percutaneous nephrolithotomy with nephrostomy catheter identified. There is been reduction in the overall stone burden with a single residual 12 mm stone identified in the lower pole. This is decreased from the prior exam a which time it measured 14 mm. Also additional smaller stones have been removed in the interval. A ureteral stent is noted extending into the ileal loop although does not extend through the ileostomy in the right abdomen. No  definitive ureteral stones are seen. The bladder has been surgically removed. Stomach/Bowel: Changes consistent with the right-sided ileostomy. No obstructive changes are seen. The appendix is not visualize consistent with prior surgical history. Vascular/Lymphatic: Aortic atherosclerosis. No enlarged abdominal or pelvic lymph nodes. Reproductive: The prostate has been surgically removed. Other: No abdominal wall hernia or abnormality. No abdominopelvic ascites. Musculoskeletal: No acute or significant osseous findings. IMPRESSION: Reduction in size and number of right-sided renal calculi following recent nephrolithotomy. Tubes and lines bilaterally as described. Reduction in size and number of left renal calculi when compared with the prior exam. No new focal abnormality is seen. Electronically Signed   By: Inez Catalina M.D.   On: 10/15/2016 08:36    Anti-infectives: Anti-infectives    Start     Dose/Rate Route Frequency Ordered Stop   10/14/16 1330  ciprofloxacin (CIPRO) IVPB 200 mg     200 mg 100 mL/hr over 60 Minutes Intravenous  Once 10/14/16 1326 10/14/16 1358   10/14/16 1139  cefTRIAXone (ROCEPHIN) 2 g in dextrose 5 % 50 mL IVPB     2 g 100 mL/hr over 30 Minutes Intravenous 30 min pre-op 10/14/16 1139 10/14/16 1300      Assessment/Plan:   1 - Right Partial Staghorn Kidney Stone - proceed as planned with 2nd stage right stone procedure tomorrow, given his variant anatomy with ileal conduit post cystectomy completley stone free is ideal goal.  NPO p MN. Increase PRN's for pain.   [facility], Braidon [place] 10/15/2016

## 2016-10-15 NOTE — Op Note (Signed)
NAMEVESTER, Darren Carr                 ACCOUNT NO.:  [id number]  MEDICAL RECORD NO.:  [id number]  LOCATION:                                 FACILITY:  PHYSICIAN:  Darren Frock, MD     DATE OF BIRTH:  12-Nov-1950  DATE OF PROCEDURE: 10/14/2016                              OPERATIVE REPORT   PREOPERATIVE DIAGNOSES: 1. Right partial staghorn kidney stone. 2. Right ureteral stone. 3. History of acute on chronic renal failure.  POSTOPERATIVE DIAGNOSES: 1. Right partial staghorn kidney stone. 2. Right ureteral stone. 3. History of acute on chronic renal failure. 4. Right ureteral cancer.  PROCEDURE: 1. Right first-stage percutaneous nephrostolithotomy. 2. Right antegrade nephrostogram interpretation. 3. Right nephrostomy tube exchange. 4. Right nephroureteral stent placement. 5. Right ureteroscopic biopsy.  ESTIMATED BLOOD LOSS:  Nil.  COMPLICATIONS:  None.  SPECIMENS: 1. Right renal and ureteral stone fragments given to the patient. 2. Right ureteral tumor for permanent pathology.  FINDINGS: 1. Successful dilation of lower pole tract and kidney. 2. Multifocal relatively small volume right intrarenal stones, less     than 2 cm. 3. Multifocal right ureteral stones, approximately 1 cm. 4. Small, approximately 8 mm in diameter papillary tumor at the     ureteroileal anastomosis on the right.  This was completely     resected via snaring biopsy. 5. Successful placement of right nephroureteral stent. 6. Successful replacement of right nephrostomy tube by antegrade     nephrostogram.  DRAINS: 1. Urostomy to gravity drainage. 2. Right nephrostomy tube to gravity drainage.  INDICATION:  Darren Carr is a very pleasant, but unfortunate 66 year old gentleman with history of metastatic bladder cancer.  He is status post cystoprostatectomy over a year ago.  He has unfortunately developed a left ureteral stricture now, managed with chronic nephroureteral stent on the left, but was  found on workup of acute on chronic renal insufficiency to have new right hydronephrosis with right partial staghorn kidney stone and ureteral stone that were managed initially with right nephrostomy tube.  Options were discussed for further management of the right renal unit as he wished to be nephrostomy tube free, it was felt that right percutaneous approach stone surgery would be warranted and he wished to proceed.  He has been on preoperative antibiotics based on most recent cultures and informed consent was obtained and placed in the medical record.  PROCEDURE IN DETAIL:  The patient being Darren Carr was verified. Procedure being right first-stage percutaneous nephrostolithotomy was confirmed.  Procedure was carried out.  Time-out was performed. Intravenous antibiotics were administered.  General endotracheal anesthesia introduced.  The patient placed into a prone position employing prone view, axillary rolls, chest rolls, padding of his knees and ankles.  His right lower quadrant urostomy, which has an externalized left nephroureteral stent to gravity drainage. Approximately 15 degrees of table flexion was used to maximize its base being the 12th rib and sacrum.  Sterile field was created by prepping and draping the patient's right flank and in situ nephrostomy tube, which was clamped using chlorhexidine gluconate.  Percutaneous drape was applied.  Initial antegrade nephrostogram was performed.  Initial antegrade nephrostogram revealed lower pole placement of in  situ nephrostomy tube.  There were several filling defects mostly within the proximal ureter consistent with known stone.  There was minimal filling defect within the renal pelvis.  A 0.038 Zip wire was coiled into the renal pelvis.  The nephrostomy tube was exchanged for KMP angled tip catheter, which via the zip wire was navigated down to the level of the ureteroileal anastomosis.  I then passed this and exchanged for  a Superstiff wire.  The coaxial dilator was then used with fluoroscopic vision and passed to the level of proximal ureter and a second Superstiff wire was placed to the conduit level via exchange of the KMP catheter and zip wire having dull Superstiff access down to the ureteroileal level.  Incision was made in the flank at the access site for a distance of approximately 1.5 cm.  Fascia was dilated using fluoroscopically guided Kelly clamp and the NephroMax balloon dilation apparatus was carefully advanced across the lower pole calyx to a pressure of 20 atmospheres, held for 90 seconds and the sheath very carefully advanced across this using fluoroscopic guidance taking exquisite care not to perforate renal pelvis, which did not occur. Initial rigid nephroscopy was performed, which revealed excellent sheath placement across the lower pole calyx.  There was no obvious intra pelvis stone with rigid technique, therefore flexible nephroscopy was performed, which revealed multifocal small lying lower pole stones that were amenable to basketing.  This was approximately 1 to 1.5 cm total diameter, these were set aside as the goal today was rendering the renal unit on the right stone free.  Ureteroscopy was performed in antegrade fashion with the flexible ureteroscope.  There were 2 ureteral stones noted in the proximal third of the ureter.  These were also amenable to simple basketing, which were retrieved with an Escape basket and set aside as well.  Flexible ureteroscopy continued in antegrade fashion towards the area of ureteroileal anastomosis.  At the area of the ureteroileal anastomosis, there was immediately noted to be a papillary tumor within the ureter, again this was quite distal right at the level of anastomosis, this estimated approximately 8 mm in diameter.  This was quite focal.  Given this finding, it was clearly felt that biopsy and possible ablation was warranted.  The Escape  basket was then used, which using a snaring technique, the entire lesion was actually amenable to snaring.  This was removed and set aside for permanent pathology.  Quite amazingly this provided complete resolution of all intraluminal tumor within the right ureter to the area of the ureteroileal anastomosis, which was then widely patent.  It was not felt that any laser ablation would be warranted at this site as it was felt that since there was no residual tumor that it may lead to further stricture disease as well as intraluminal stone had been removed and that we had cleared the ureter to the ureteroileal anastomosis.  The KMP catheter was advanced using fluoroscopic guidance to the level of the ureteral anastomosis over the Superstiff safety wire acting as an externalized nephroureteral stent, which was then capped.  The zip wire was coiled in the level of renal pelvis over which a 14-French nephrostomy tube was carefully placed, coiled in position.  Final antegrade nephrostogram revealed excellent placement of the nephrostomy tube in the renal pelvis.  There was no evidence of extravasation whatsoever.  There was excellent nephroureteral stent placement.  Sheath was removed.  The nephrostomy tube and externalized stent were sutured in place separately. Percutaneous drape  was applied to nephrostomy tube to straight drain. Procedure terminated.  The patient tolerated the procedure well.  There were no immediate periprocedural complications.  The patient was taken to the Postanesthesia Care Unit in stable condition with plan for hospital admission, CT imaging tomorrow morning which if the patient stone free, we may not to proceed with second-stage procedure as previously scheduled.    ______________________________ Darren Frock, MD   ______________________________ Darren Frock, MD    TM/MEDQ  D:  10/14/2016  T:  10/15/2016  Job:  [id number]

## 2016-10-15 NOTE — Progress Notes (Signed)
Initial Nutrition Assessment  DOCUMENTATION CODES:   Not applicable  INTERVENTION:   Snacks  NUTRITION DIAGNOSIS:   Increased nutrient needs related to catabolic illness as evidenced by increased estimated needs from protein.  GOAL:   Patient will meet greater than or equal to 90% of their needs   MONITOR:   PO intake, Labs, Weight trends  REASON FOR ASSESSMENT:   Malnutrition Screening Tool    ASSESSMENT:   66 y/o male with h/o Metastatic Bladder Cancer - s/p robotic cystoprostatectomy with ICG sentinel + template pelvic lymphadenectomy and ileal conduit urinary diversion on 01/16/15 for [id number] urothelial carcinoma who presents for Rt 1st stage Percutantous nephrostolithotomy, Rt ureteroscopy with biopsy, Rt antegrade nephrostogram, Rt nephrostomy tube exchange, Rt nephroureteral stent placement   Met with pt in room today. Pt reports good appetite and oral intake pta. Pt currently eating 100% meals. Pt reports that he lost 30lbs when he first got diagnosed with cancer; per chart, pt's weight has been stable since November 2017. Pt does not like supplements. RD will order snacks.   Medications reviewed and include: senokot, hydromorphone, oxycodone  Labs reviewed: BUN 24(H), creat 2.72(H), Ca 7.8(L) Wbc- 10.7(H) 1/30  Nutrition-Focused physical exam completed. Findings are no fat depletion, no muscle depletion, and no edema.   Diet Order:  Diet regular Room service appropriate? Yes; Fluid consistency: Thin Diet NPO time specified  Skin:  Wound (see comment) (incision back)  Last BM:  1/30  Height:   Ht Readings from Last 1 Encounters:  10/14/16 5' 10.5" (1.791 m)    Weight:   Wt Readings from Last 1 Encounters:  10/14/16 162 lb (73.5 kg)    Ideal Body Weight:  76.8 kg  BMI:  Body mass index is 22.92 kg/m.  Estimated Nutritional Needs:   Kcal:  1850-2150kcal/day   Protein:  81-96g/day   Fluid:  >1.8L/day   EDUCATION NEEDS:   No education  needs identified at this time  Koleen Distance, RD, LDN Pager #[phone]

## 2016-10-16 ENCOUNTER — Encounter (HOSPITAL_COMMUNITY): Payer: Self-pay | Admitting: Anesthesiology

## 2016-10-16 ENCOUNTER — Inpatient Hospital Stay (HOSPITAL_COMMUNITY): Payer: BLUE CROSS/BLUE SHIELD | Admitting: Anesthesiology

## 2016-10-16 ENCOUNTER — Encounter (HOSPITAL_COMMUNITY)
Admission: RE | Disposition: A | Discharge status: Home / Self Care | Payer: Self-pay | Source: Ambulatory Visit | Attending: Urology

## 2016-10-16 ENCOUNTER — Inpatient Hospital Stay (HOSPITAL_COMMUNITY): Payer: BLUE CROSS/BLUE SHIELD

## 2016-10-16 HISTORY — PX: NEPHROLITHOTOMY: SHX5134

## 2016-10-16 HISTORY — PX: HOLMIUM LASER APPLICATION: SHX5852

## 2016-10-16 LAB — SURGICAL PCR SCREEN
MRSA, PCR: NEGATIVE
STAPHYLOCOCCUS AUREUS: NEGATIVE

## 2016-10-16 SURGERY — NEPHROLITHOTOMY PERCUTANEOUS SECOND LOOK
Anesthesia: General | Laterality: Right

## 2016-10-16 MED ORDER — PROPOFOL 10 MG/ML IV BOLUS
INTRAVENOUS | Status: DC | PRN
  Administered 2016-10-16: 150 mg via INTRAVENOUS

## 2016-10-16 MED ORDER — FAMOTIDINE 20 MG PO TABS
20.0000 mg | ORAL_TABLET | Freq: Two times a day (BID) | ORAL | Status: DC | PRN
  Administered 2016-10-16: 20 mg via ORAL
  Filled 2016-10-16: qty 1

## 2016-10-16 MED ORDER — PHENYLEPHRINE 40 MCG/ML (10ML) SYRINGE FOR IV PUSH (FOR BLOOD PRESSURE SUPPORT)
PREFILLED_SYRINGE | INTRAVENOUS | Status: DC | PRN
  Administered 2016-10-16 (×3): 80 ug via INTRAVENOUS

## 2016-10-16 MED ORDER — ONDANSETRON HCL 4 MG/2ML IJ SOLN
INTRAMUSCULAR | Status: DC | PRN
  Administered 2016-10-16: 4 mg via INTRAVENOUS

## 2016-10-16 MED ORDER — HYDROMORPHONE HCL 1 MG/ML IJ SOLN
INTRAMUSCULAR | Status: AC
  Filled 2016-10-16: qty 1

## 2016-10-16 MED ORDER — IOHEXOL 300 MG/ML  SOLN
INTRAMUSCULAR | Status: DC | PRN
  Administered 2016-10-16: 22.5 mL

## 2016-10-16 MED ORDER — MEPERIDINE HCL 50 MG/ML IJ SOLN: 6.2500 mg | INTRAMUSCULAR | Status: DC | PRN

## 2016-10-16 MED ORDER — SODIUM CHLORIDE 0.9 % IR SOLN
Status: DC | PRN
  Administered 2016-10-16: 7000 mL

## 2016-10-16 MED ORDER — DEXTROSE 5 % IV SOLN
1.0000 g | INTRAVENOUS | Status: AC
  Administered 2016-10-16: 1 g via INTRAVENOUS
  Filled 2016-10-16: qty 10

## 2016-10-16 MED ORDER — SUGAMMADEX SODIUM 200 MG/2ML IV SOLN
INTRAVENOUS | Status: DC | PRN
  Administered 2016-10-16: 200 mg via INTRAVENOUS

## 2016-10-16 MED ORDER — ONDANSETRON HCL 4 MG/2ML IJ SOLN: 4.0000 mg | Freq: Once | INTRAMUSCULAR | Status: DC | PRN

## 2016-10-16 MED ORDER — DEXAMETHASONE SODIUM PHOSPHATE 10 MG/ML IJ SOLN
INTRAMUSCULAR | Status: DC | PRN
  Administered 2016-10-16: 8 mg via INTRAVENOUS

## 2016-10-16 MED ORDER — LACTATED RINGERS IV SOLN
INTRAVENOUS | Status: DC
  Administered 2016-10-16: 1000 mL via INTRAVENOUS
  Administered 2016-10-16: 12:00:00 via INTRAVENOUS

## 2016-10-16 MED ORDER — CIPROFLOXACIN IN D5W 200 MG/100ML IV SOLN
200.0000 mg | Freq: Once | INTRAVENOUS | Status: AC
Start: 2016-10-16 — End: 2016-10-16
  Administered 2016-10-16: 200 mg via INTRAVENOUS
  Filled 2016-10-16: qty 100

## 2016-10-16 MED ORDER — FENTANYL CITRATE (PF) 100 MCG/2ML IJ SOLN
INTRAMUSCULAR | Status: DC | PRN
  Administered 2016-10-16 (×2): 50 ug via INTRAVENOUS
  Administered 2016-10-16: 150 ug via INTRAVENOUS

## 2016-10-16 MED ORDER — MIDAZOLAM HCL 5 MG/5ML IJ SOLN
INTRAMUSCULAR | Status: DC | PRN
  Administered 2016-10-16: 2 mg via INTRAVENOUS

## 2016-10-16 MED ORDER — ROCURONIUM BROMIDE 10 MG/ML (PF) SYRINGE
PREFILLED_SYRINGE | INTRAVENOUS | Status: DC | PRN
  Administered 2016-10-16: 10 mg via INTRAVENOUS
  Administered 2016-10-16: 50 mg via INTRAVENOUS

## 2016-10-16 MED ORDER — LIDOCAINE 2% (20 MG/ML) 5 ML SYRINGE
INTRAMUSCULAR | Status: DC | PRN
  Administered 2016-10-16: 100 mg via INTRAVENOUS

## 2016-10-16 MED ORDER — HYDROMORPHONE HCL 1 MG/ML IJ SOLN
0.2500 mg | INTRAMUSCULAR | Status: DC | PRN
  Administered 2016-10-16: 0.5 mg via INTRAVENOUS

## 2016-10-16 SURGICAL SUPPLY — 46 items
APL SKNCLS STERI-STRIP NONHPOA (GAUZE/BANDAGES/DRESSINGS) ×1
BAG URINE DRAINAGE (UROLOGICAL SUPPLIES) ×1 IMPLANT
BASKET LASER NITINOL 1.9FR (BASKET) ×4 IMPLANT
BASKET ZERO TIP NITINOL 2.4FR (BASKET) IMPLANT
BENZOIN TINCTURE PRP APPL 2/3 (GAUZE/BANDAGES/DRESSINGS) ×6 IMPLANT
BLADE SURG 15 STRL LF DISP TIS (BLADE) ×1 IMPLANT
BLADE SURG 15 STRL SS (BLADE) ×3
BSKT STON RTRVL 120 1.9FR (BASKET) ×2
BSKT STON RTRVL ZERO TP 2.4FR (BASKET)
CATCHER STONE W/TUBE ADAPTER (UROLOGICAL SUPPLIES) IMPLANT
CATH FOLEY 2W COUNCIL 20FR 5CC (CATHETERS) IMPLANT
CATH ROBINSON RED A/P 20FR (CATHETERS) IMPLANT
CATH X-FORCE N30 NEPHROSTOMY (TUBING) ×1 IMPLANT
COVER SURGICAL LIGHT HANDLE (MISCELLANEOUS) ×3 IMPLANT
DRAPE C-ARM 42X120 X-RAY (DRAPES) ×3 IMPLANT
DRAPE LINGEMAN PERC (DRAPES) ×3 IMPLANT
DRAPE SURG IRRIG POUCH 19X23 (DRAPES) ×3 IMPLANT
DRSG PAD ABDOMINAL 8X10 ST (GAUZE/BANDAGES/DRESSINGS) ×2 IMPLANT
DRSG TEGADERM 8X12 (GAUZE/BANDAGES/DRESSINGS) ×4 IMPLANT
FIBER LASER FLEXIVA 1000 (UROLOGICAL SUPPLIES) IMPLANT
FIBER LASER FLEXIVA 365 (UROLOGICAL SUPPLIES) IMPLANT
FIBER LASER FLEXIVA 550 (UROLOGICAL SUPPLIES) IMPLANT
FIBER LASER TRAC TIP (UROLOGICAL SUPPLIES) ×2 IMPLANT
GAUZE SPONGE 4X4 12PLY STRL (GAUZE/BANDAGES/DRESSINGS) ×3 IMPLANT
GLOVE BIOGEL M STRL SZ7.5 (GLOVE) ×3 IMPLANT
GOWN STRL REUS W/TWL LRG LVL3 (GOWN DISPOSABLE) ×6 IMPLANT
GUIDEWIRE AMPLAZ .035X145 (WIRE) ×1 IMPLANT
GUIDEWIRE ANG ZIPWIRE 038X150 (WIRE) ×1 IMPLANT
GUIDEWIRE STR DUAL SENSOR (WIRE) ×3 IMPLANT
KIT BASIN OR (CUSTOM PROCEDURE TRAY) ×3 IMPLANT
MANIFOLD NEPTUNE II (INSTRUMENTS) ×3 IMPLANT
NS IRRIG 1000ML POUR BTL (IV SOLUTION) ×1 IMPLANT
PACK BASIC VI WITH GOWN DISP (CUSTOM PROCEDURE TRAY) ×3 IMPLANT
PAD ABD 8X10 STRL (GAUZE/BANDAGES/DRESSINGS) ×2 IMPLANT
PROBE LITHOCLAST ULTRA 3.8X403 (UROLOGICAL SUPPLIES) IMPLANT
PROBE PNEUMATIC 1.0MMX570MM (UROLOGICAL SUPPLIES) IMPLANT
SPONGE LAP 4X18 X RAY DECT (DISPOSABLE) ×3 IMPLANT
STONE CATCHER W/TUBE ADAPTER (UROLOGICAL SUPPLIES) IMPLANT
SUT SILK 2 0 30  PSL (SUTURE) ×2
SUT SILK 2 0 30 PSL (SUTURE) IMPLANT
SYR 10ML LL (SYRINGE) ×3 IMPLANT
SYR 20CC LL (SYRINGE) ×6 IMPLANT
TOWEL OR NON WOVEN STRL DISP B (DISPOSABLE) ×3 IMPLANT
TRAY FOLEY W/METER SILVER 16FR (SET/KITS/TRAYS/PACK) ×1 IMPLANT
TUBING CONNECTING 10 (TUBING) ×6 IMPLANT
TUBING CONNECTING 10' (TUBING) ×3

## 2016-10-16 NOTE — Anesthesia Procedure Notes (Signed)
Procedure Name: Intubation Date/Time: 10/16/2016 10:20 AM Performed by: Derron Pipkins, Virgel Gess Pre-anesthesia Checklist: Patient identified, Emergency Drugs available, Suction available, Patient being monitored and Timeout performed Patient Re-evaluated:Patient Re-evaluated prior to inductionOxygen Delivery Method: Circle system utilized Preoxygenation: Pre-oxygenation with 100% oxygen Intubation Type: IV induction Ventilation: Mask ventilation without difficulty Laryngoscope Size: Mac and 4 Grade View: Grade II Tube type: Oral Tube size: 7.5 mm Number of attempts: 1 Airway Equipment and Method: Stylet Placement Confirmation: ETT inserted through vocal cords under direct vision,  positive ETCO2,  CO2 detector and breath sounds checked- equal and bilateral Secured at: 22 cm Tube secured with: Tape Dental Injury: Teeth and Oropharynx as per pre-operative assessment

## 2016-10-16 NOTE — Transfer of Care (Signed)
Immediate Anesthesia Transfer of Care Note  Patient: Darren Carr  Procedure(s) Performed: Procedure(s): NEPHROLITHOTOMY PERCUTANEOUS SECOND STAGE , ANTEGRADE NEPHROSTOGRAM (Right) HOLMIUM LASER APPLICATION (Right)  Patient Location: PACU  Anesthesia Type:General  Level of Consciousness:  sedated, patient cooperative and responds to stimulation  Airway & Oxygen Therapy:Patient Spontanous Breathing and Patient connected to face mask oxgen  Post-op Assessment:  Report given to PACU RN and Post -op Vital signs reviewed and stable  Post vital signs:  Reviewed and stable  Last Vitals:  Vitals:   10/15/16 2148 10/16/16 0458  BP: 119/79 133/81  Pulse: 64 67  Resp: 18 18  Temp: 36.8 C 123XX123 C    Complications: No apparent anesthesia complications

## 2016-10-16 NOTE — Brief Op Note (Signed)
10/14/2016 - 10/16/2016  11:58 AM  PATIENT:  Darren Carr  66 y.o. male  PRE-OPERATIVE DIAGNOSIS:  RIGHT PARTIAL STAGHORN KIDNEY STONE  POST-OPERATIVE DIAGNOSIS:  RIGHT PARTIAL STAGHORN KIDNEY STONE  PROCEDURE:  Procedure(s): NEPHROLITHOTOMY PERCUTANEOUS SECOND STAGE (Right) HOLMIUM LASER APPLICATION (Right)  SURGEON:  Surgeon(s) and Role:    * [name], MD - Primary  PHYSICIAN ASSISTANT:   ASSISTANTS: none   ANESTHESIA:   general  EBL:  Total I/O In: 1000 [I.V.:1000] Out: 650 [Urine:600; Blood:50]  BLOOD ADMINISTERED:none  DRAINS: urostomy to gravity, Rt nephroureteral stent (capped)   LOCAL MEDICATIONS USED:  NONE  SPECIMEN:  Source of Specimen:  residual Rt renal stone  DISPOSITION OF SPECIMEN:  discard  COUNTS:  YES  TOURNIQUET:  * No tourniquets in log *  DICTATION: .Other Dictation: Dictation Number [id number]  PLAN OF CARE: Admit to inpatient   PATIENT DISPOSITION:  PACU - hemodynamically stable.   Delay start of Pharmacological VTE agent (>24hrs) due to surgical blood loss or risk of bleeding: yes

## 2016-10-16 NOTE — Anesthesia Postprocedure Evaluation (Signed)
Anesthesia Post Note  Patient: Darren Carr  Procedure(s) Performed: Procedure(s) (LRB): NEPHROLITHOTOMY PERCUTANEOUS SECOND STAGE , ANTEGRADE NEPHROSTOGRAM (Right) HOLMIUM LASER APPLICATION (Right)  Patient location during evaluation: PACU Anesthesia Type: General Level of consciousness: awake and alert Pain management: pain level controlled Vital Signs Assessment: post-procedure vital signs reviewed and stable Respiratory status: spontaneous breathing, nonlabored ventilation, respiratory function stable and patient connected to nasal cannula oxygen Cardiovascular status: blood pressure returned to baseline and stable Postop Assessment: no signs of nausea or vomiting Anesthetic complications: no       Last Vitals:  Vitals:   10/16/16 1315 10/16/16 1331  BP: 125/87 (!) 127/99  Pulse: (!) 58 62  Resp: 12 12  Temp: 36.8 C 37.1 C    Last Pain:  Vitals:   10/16/16 1445  TempSrc:   PainSc: 4                  Monya Kozakiewicz DAVID

## 2016-10-16 NOTE — Progress Notes (Signed)
2 Days Post-Op   Subjective:  1 - Right Partial Staghorn Kidney Stone - s/p first stage right percutaneous nephrolstolithotomy on 10/14/16, the day of admission where vast majority of stone removed. CT 10/15/16 shows 71mm residual lower pole stone.  Today "Darren Carr" is stable. NPO for 2nd stage procedure on right today. No fevers overnight.   Objective: Vital signs in last 24 hours: Temp:  [98 F (36.7 C)-98.8 F (37.1 C)] 98 F (36.7 C) (02/02 0458) Pulse Rate:  [59-70] 67 (02/02 0458) Resp:  [16-18] 18 (02/02 0458) BP: (115-133)/(70-81) 133/81 (02/02 0458) SpO2:  [98 %-99 %] 98 % (02/02 0458) Last BM Date: 10/13/16  Intake/Output from previous day: 02/01 0701 - 02/02 0700 In: 1440 [P.O.:240; I.V.:1200] Out: [id number] [Urine:3175] Intake/Output this shift: No intake/output data recorded.  General appearance: alert, cooperative, appears stated age and wife at bedside Eyes: negative Nose: Nares normal. Septum midline. Mucosa normal. No drainage or sinus tenderness. Throat: lips, mucosa, and tongue normal; teeth and gums normal Neck: supple, symmetrical, trachea midline Back: symmetric, no curvature. ROM normal. No CVA tenderness. Resp: non-labored on minimal Tea O2 Cardio: Nl rate GI: soft, non-tender; bowel sounds normal; no masses,  no organomegaly and RLQ Urostomy wtih medium pink urine, no clots Extremities: extremities normal, atraumatic, no cyanosis or edema Pulses: 2+ and symmetric Lymph nodes: Cervical, supraclavicular, and axillary nodes normal. Neurologic: Grossly normal Incision/Wound: Rt flank neph tube with medium pink urine, no clots.   Lab Results:   Recent Labs  10/13/16 0844 10/14/16 1524 10/15/16 0448  WBC 10.7*  --   --   HGB 13.3 12.3* 11.4*  HCT 40.6 37.4* 34.4*  PLT 234  --   --    BMET  Recent Labs  10/13/16 0844 10/15/16 0448  NA 135 139  K 5.0 4.2  CL 108 110  CO2 21* 23  GLUCOSE 76 90  BUN 24* 24*  CREATININE 2.84* 2.72*  CALCIUM 8.9 7.8*    PT/INR  Recent Labs  10/13/16 0844  LABPROT 14.5  INR 1.12   ABG No results for input(s): PHART, HCO3 in the last 72 hours.  Invalid input(s): PCO2, PO2  Studies/Results: Dg Abd 1 View  Result Date: 10/14/2016 CLINICAL DATA:  Percutaneous nephrolithotomy . EXAM: ABDOMEN - 1 VIEW; DG C-ARM 61-120 MIN-NO REPORT COMPARISON:  10/13/2016 . FINDINGS: Images from an intraoperative percutaneous nephrolithotomy present. Contrast in the renal collecting system. Ureteral stent noted in the renal collecting system. Nephrostomy catheter renal collecting system. IMPRESSION: Postsurgical changes. Nephrostomy tube and ureteral stent appear to be in good anatomic position. Electronically Signed   By: Marcello Moores  Register   On: 10/14/2016 15:24   Dg C-arm 61-120 Min-no Report  Result Date: 10/14/2016 CLINICAL DATA:  Percutaneous nephrolithotomy . EXAM: ABDOMEN - 1 VIEW; DG C-ARM 61-120 MIN-NO REPORT COMPARISON:  10/13/2016 . FINDINGS: Images from an intraoperative percutaneous nephrolithotomy present. Contrast in the renal collecting system. Ureteral stent noted in the renal collecting system. Nephrostomy catheter renal collecting system. IMPRESSION: Postsurgical changes. Nephrostomy tube and ureteral stent appear to be in good anatomic position. Electronically Signed   By: Marcello Moores  Register   On: 10/14/2016 15:24   Ct Renal Stone Study  Result Date: 10/15/2016 CLINICAL DATA:  Status post left percutaneous nephrolithotomy. EXAM: CT ABDOMEN AND PELVIS WITHOUT CONTRAST TECHNIQUE: Multidetector CT imaging of the abdomen and pelvis was performed following the standard protocol without IV contrast. COMPARISON:  08/10/2016 FINDINGS: Lower chest: Lung bases again demonstrate persistent scarring bilaterally. No acute infiltrate  or effusion is noted. Hepatobiliary: The gallbladder is decompressed. The liver is within normal limits. Pancreas: Unremarkable. No pancreatic ductal dilatation or surrounding inflammatory  changes. Spleen: Normal in size without focal abnormality. Adrenals/Urinary Tract: The adrenal glands are within normal limits bilaterally. A retrograde nephroureteral catheter is noted on the left extending into a right-sided ileostomy. Scattered small stone fragments are noted but improved when compared with the prior exam. The largest of these lies in the lower pole measuring 7 mm in greatest dimension. This is slightly decreased from 9.5 mm on the prior exam. No definitive ureteral stone is seen. On the right, there are changes consistent with the recent percutaneous nephrolithotomy with nephrostomy catheter identified. There is been reduction in the overall stone burden with a single residual 12 mm stone identified in the lower pole. This is decreased from the prior exam a which time it measured 14 mm. Also additional smaller stones have been removed in the interval. A ureteral stent is noted extending into the ileal loop although does not extend through the ileostomy in the right abdomen. No definitive ureteral stones are seen. The bladder has been surgically removed. Stomach/Bowel: Changes consistent with the right-sided ileostomy. No obstructive changes are seen. The appendix is not visualize consistent with prior surgical history. Vascular/Lymphatic: Aortic atherosclerosis. No enlarged abdominal or pelvic lymph nodes. Reproductive: The prostate has been surgically removed. Other: No abdominal wall hernia or abnormality. No abdominopelvic ascites. Musculoskeletal: No acute or significant osseous findings. IMPRESSION: Reduction in size and number of right-sided renal calculi following recent nephrolithotomy. Tubes and lines bilaterally as described. Reduction in size and number of left renal calculi when compared with the prior exam. No new focal abnormality is seen. Electronically Signed   By: Inez Catalina M.D.   On: 10/15/2016 08:36    Anti-infectives: Anti-infectives    Start     Dose/Rate Route  Frequency Ordered Stop   10/14/16 1330  ciprofloxacin (CIPRO) IVPB 200 mg     200 mg 100 mL/hr over 60 Minutes Intravenous  Once 10/14/16 1326 10/14/16 1358   10/14/16 1139  cefTRIAXone (ROCEPHIN) 2 g in dextrose 5 % 50 mL IVPB     2 g 100 mL/hr over 30 Minutes Intravenous 30 min pre-op 10/14/16 1139 10/14/16 1300      Assessment/Plan:  Proceed as planned with RIGHT 2nd stage PCNL today with goal of right side stone free. Risks, benefits, expected peri-op course with likely DC tomorrow discussed previously and reiterated today.   [facility], Yolander Goodie 10/16/2016

## 2016-10-16 NOTE — Anesthesia Preprocedure Evaluation (Addendum)
Anesthesia Evaluation  Patient identified by MRN, date of birth, ID band Patient awake    Reviewed: Allergy & Precautions, NPO status , Patient's Chart, lab work & pertinent test results  Airway Mallampati: I  TM Distance: >3 FB Neck ROM: Full    Dental   Pulmonary Current Smoker,    Pulmonary exam normal        Cardiovascular hypertension, Pt. on medications + CAD and + Past MI  Normal cardiovascular exam     Neuro/Psych    GI/Hepatic GERD  Medicated and Controlled,  Endo/Other    Renal/GU Renal InsufficiencyRenal disease     Musculoskeletal   Abdominal   Peds  Hematology   Anesthesia Other Findings   Reproductive/Obstetrics                             Anesthesia Physical Anesthesia Plan  ASA: III  Anesthesia Plan: General   Post-op Pain Management:    Induction: Intravenous  Airway Management Planned: Oral ETT  Additional Equipment:   Intra-op Plan:   Post-operative Plan: Extubation in OR  Informed Consent: I have reviewed the patients History and Physical, chart, labs and discussed the procedure including the risks, benefits and alternatives for the proposed anesthesia with the patient or authorized representative who has indicated his/her understanding and acceptance.     Plan Discussed with: CRNA and Surgeon  Anesthesia Plan Comments:         Anesthesia Quick Evaluation

## 2016-10-16 NOTE — Progress Notes (Addendum)
Pre procedure check list completed.  Report called to RN in [place].  Reported that IV cipro was not given because it was not yet sent to the floor from pharmacy.

## 2016-10-17 MED ORDER — OXYCODONE HCL 20 MG PO TABS: 10.0000 mg | ORAL_TABLET | ORAL | 0 refills | Status: DC | PRN

## 2016-10-17 MED ORDER — OXYCODONE HCL 10 MG PO TABS: 10.0000 mg | ORAL_TABLET | ORAL | 0 refills | Status: DC | PRN

## 2016-10-17 NOTE — Discharge Summary (Signed)
Date of admission: 10/14/2016  Date of discharge: 10/17/2016  Admission diagnosis: right renal stone  Discharge diagnosis: right renal stone  Secondary diagnoses: none  History and Physical: For full details, please see admission history and physical. Briefly, Darren Carr is a 66 y.o. year old patient with right renal stone.   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 Louis Meckel on 10/14/16 and second look procedure on 10/16/16 after which he was stone free. He did well post-operatively. Following his second look procedure his nephrostomy tube was removed and a nephroureteral stent was left in place intact. By the morning on 10/17/16  His diet was slowly advanced and at the time of discharge he was tolerating a regular diet, ambulating at his baseline, making adequate urine via conduit, and pain was well controlled with oral narcotics. He was discharged to home on 10/17/16.  Laboratory values:  Recent Labs  10/14/16 1524 10/15/16 0448  HGB 12.3* 11.4*  HCT 37.4* 34.4*    Recent Labs  10/15/16 0448  CREATININE 2.72*    Disposition: Home  Discharge instruction: The patient was instructed to be ambulatory but told to refrain from heavy lifting, strenuous activity, or driving.   Discharge medications:  Allergies as of 10/17/2016   No Known Allergies     Medication List    STOP taking these medications   apixaban 2.5 MG Tabs tablet Commonly known as:  ELIQUIS   aspirin 81 MG EC tablet     TAKE these medications   amiodarone 200 MG tablet Commonly known as:  PACERONE Take 1 tablet (200 mg total) by mouth daily.   amoxicillin-clavulanate 500-125 MG tablet Commonly known as:  AUGMENTIN Take 1 tablet (500 mg total) by mouth every 12 (twelve) hours.   atorvastatin 40 MG tablet Commonly known as:  LIPITOR Take 1 tablet (40 mg total) by mouth daily at 6 PM.   carvedilol 3.125 MG tablet Commonly known as:  COREG Take 1 tablet (3.125 mg total) by  mouth 2 (two) times daily with a meal.   ciprofloxacin 250 MG tablet Commonly known as:  CIPRO Take 250 mg by mouth 2 (two) times daily. 6 day course started 10-11-2016   ferrous sulfate 300 (60 Fe) MG/5ML syrup Take 5 mLs (300 mg total) by mouth daily.   lisinopril 2.5 MG tablet Commonly known as:  PRINIVIL,ZESTRIL Take 1 tablet (2.5 mg total) by mouth daily.   nitroGLYCERIN 0.4 MG SL tablet Commonly known as:  NITROSTAT Place 1 tablet (0.4 mg total) under the tongue every 5 (five) minutes x 3 doses as needed for chest pain.   Oxycodone HCl 10 MG Tabs Take 1 tablet (10 mg total) by mouth every 4 (four) hours as needed (pain). What changed:  how much to take  additional instructions   pantoprazole 40 MG tablet Commonly known as:  PROTONIX Take 1 tablet (40 mg total) by mouth 2 (two) times daily. What changed:  when to take this  reasons to take this   polyethylene glycol packet Commonly known as:  MIRALAX / GLYCOLAX Take 17 g by mouth daily as needed for mild constipation.       Followup:  Follow-up Information    Follow up Follow up on 11/03/2016.   Why:  at 10:30 for MD visit.

## 2016-10-17 NOTE — Op Note (Signed)
NAMEJANI, Darren Carr                 ACCOUNT NO.:  [id number]  MEDICAL RECORD NO.:  [id number]  LOCATION:  [id number]                         FACILITY:  [facility]  PHYSICIAN:  [name], MD     DATE OF BIRTH:  1951-07-15  DATE OF PROCEDURE:  10/16/2016                              OPERATIVE REPORT   DIAGNOSIS:  Residual right renal stone.  PROCEDURE: 1. Second-look percutaneous nephrostolithotomy stone, less than 2 cm. 2. Right antegrade nephrostogram interpretation. 3. Right ureteroscopy with basketing of stones.  ESTIMATED BLOOD LOSS:  Nil.  COMPLICATION:  None.  SPECIMENS:  Right renal and ureteral stone fragments for discard.  FINDINGS: 1. Approximately 9-10 mm lower pole residual stone in acutely-angled     calyx to access tract. 2. Small volume right proximal ureteral stone. 3. Complete resolution of all stone fragments larger than 1/3rd mm     following laser lithotripsy and basket extraction. 4. No evidence of extravasation on final antegrade nephrostogram. 5. Excellent positioning of right nephroureteral stent proximal and     externalized capped distal end conduit.  INDICATION:  Darren Carr is a very pleasant, but unfortunate 66 year old gentleman with history of metastatic bladder cancer.  He is status post cystoprostatectomy and urinary diversion.  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  He was temporized with a right nephrostomy tube and left nephroureteral stent.  He underwent first-stage right percutaneous nephrostolithotomy 2 days ago at which point, the vast majority of his right renal and right ureteral stone burden was removed.  He underwent CT scan, but this did reveal that there was some residual right lower pole renal stone and the calyx had appeared to be acutely angled to the access tract, and it was felt that a second stage procedure would be  warranted especially given the patient's frail anatomy as retrograde surgery is not possible.  Informed consent was obtained and placed in the medical record.  PROCEDURE IN DETAIL:  The patient being, Darren Carr, was verified. Procedure being right second stage percutaneous nephrostolithotomy was confirmed.  Procedure was carried out.  Time-out was performed. Intravenous antibiotics were administered.  General endotracheal anesthesia introduced.  The patient placed into a prone position employing prone view, chest rolls, padding of his knees and ankles, sequential compression devices.  His in situ urostomy was connected to straight drain.  The patient's right flank was prepped using chlorhexidine gluconate.  Percutaneous drape was applied.  Initial antegrade nephrostogram via the nephrostomy tube revealed no obvious large filling defects and no evidence of extravasation.  The nephrostomy tube was removed and flexible nephroscopy was performed using 16-French flexible cystoscope.  As anticipated, there was a small volume approximately 8-10 mm right extreme lower pole renal stone that was in a calyx that was acutely angled to the access tract such that it could not be visualized with a sheath in place previously.  This appeared to be too large for simple basketing.  As such, holmium laser energy applied to the stone using 0.2 joules and 20 Hz fragmenting the stone which was quite soft and smaller fragments that  were amenable to a flexible nephroscopy and basket extraction with an escape basket. Antegrade ureteroscopy was performed using a single channel flexible ureteroscope and small volume of ureteral components of the stones was noted and also removed with basketing.  Following this, there was a wide open ureter to the level of the conduit.  The in situ nephroureteral stent was left capped.  It was not felt that further renal drainage would be warranted.  As such, the flexible  nephroscopy was removed.  The percutaneous site closed at the level of the skin using interrupted Vicryl.  The in situ nephroureteral stent was placed under 4x4 padding and percutaneous dressing applied and the procedure terminated.  The patient tolerated the procedure well.  There were no immediate periprocedural complications.  The patient was taken to the postanesthesia care in stable condition.          ______________________________ [name], MD     TM/MEDQ  D:  10/16/2016  T:  10/17/2016  Job:  [id number]

## 2016-11-23 ENCOUNTER — Ambulatory Visit (HOSPITAL_COMMUNITY)
Admission: RE | Admit: 2016-11-23 | Discharge: 2016-11-23 | Disposition: A | Discharge status: Home / Self Care | Payer: BLUE CROSS/BLUE SHIELD | Source: Ambulatory Visit | Attending: Urology | Admitting: Urology

## 2016-11-23 ENCOUNTER — Other Ambulatory Visit: Payer: Self-pay | Admitting: Urology

## 2016-11-23 ENCOUNTER — Encounter (HOSPITAL_COMMUNITY): Payer: Self-pay | Admitting: Interventional Radiology

## 2016-11-23 DIAGNOSIS — N135 Crossing vessel and stricture of ureter without hydronephrosis: Secondary | ICD-10-CM | POA: Insufficient documentation

## 2016-11-23 DIAGNOSIS — C679 Malignant neoplasm of bladder, unspecified: Secondary | ICD-10-CM

## 2016-11-23 DIAGNOSIS — Z436 Encounter for attention to other artificial openings of urinary tract: Secondary | ICD-10-CM | POA: Insufficient documentation

## 2016-11-23 HISTORY — PX: IR GENERIC HISTORICAL: IMG1180011

## 2016-11-23 MED ORDER — IOPAMIDOL (ISOVUE-300) INJECTION 61%
INTRAVENOUS | Status: AC
  Administered 2016-11-23: 10 mL via ORAL
  Filled 2016-11-23: qty 50

## 2016-11-23 MED ORDER — IOPAMIDOL (ISOVUE-300) INJECTION 61%
50.0000 mL | Freq: Once | INTRAVENOUS | Status: AC | PRN
  Administered 2016-11-23: 10 mL via ORAL

## 2016-11-23 NOTE — Procedures (Signed)
Left ureteral stricture  S/p fluoro left nephroureteral cath exchg  No comp  EBL 0  Full report in PACS

## 2016-12-28 ENCOUNTER — Other Ambulatory Visit: Payer: Self-pay | Admitting: Urology

## 2016-12-28 ENCOUNTER — Ambulatory Visit (HOSPITAL_COMMUNITY)
Admission: RE | Admit: 2016-12-28 | Discharge: 2016-12-28 | Disposition: A | Discharge status: Home / Self Care | Payer: BLUE CROSS/BLUE SHIELD | Source: Ambulatory Visit | Attending: Urology | Admitting: Urology

## 2016-12-28 ENCOUNTER — Encounter (HOSPITAL_COMMUNITY): Payer: Self-pay | Admitting: Diagnostic Radiology

## 2016-12-28 DIAGNOSIS — Z436 Encounter for attention to other artificial openings of urinary tract: Secondary | ICD-10-CM | POA: Diagnosis present

## 2016-12-28 DIAGNOSIS — N135 Crossing vessel and stricture of ureter without hydronephrosis: Secondary | ICD-10-CM | POA: Diagnosis not present

## 2016-12-28 DIAGNOSIS — C679 Malignant neoplasm of bladder, unspecified: Secondary | ICD-10-CM

## 2016-12-28 HISTORY — PX: IR CATHETER TUBE CHANGE: IMG717

## 2016-12-28 MED ORDER — IOPAMIDOL (ISOVUE-300) INJECTION 61%
INTRAVENOUS | Status: AC
  Administered 2016-12-28: 10 mL
  Filled 2016-12-28: qty 50

## 2016-12-28 MED ORDER — IOPAMIDOL (ISOVUE-300) INJECTION 61%
50.0000 mL | Freq: Once | INTRAVENOUS | Status: AC | PRN
  Administered 2016-12-28: 10 mL

## 2016-12-28 NOTE — Procedures (Signed)
Successful exchange of the left nephroureteral catheter via the ileal conduit.  No blood loss and no immediate complication.

## 2017-01-14 ENCOUNTER — Ambulatory Visit (HOSPITAL_COMMUNITY)
Admission: RE | Admit: 2017-01-14 | Discharge: 2017-01-14 | Disposition: A | Discharge status: Home / Self Care | Payer: BLUE CROSS/BLUE SHIELD | Source: Ambulatory Visit | Attending: Urology | Admitting: Urology

## 2017-01-14 ENCOUNTER — Encounter (HOSPITAL_COMMUNITY): Payer: Self-pay

## 2017-01-14 ENCOUNTER — Other Ambulatory Visit: Payer: Self-pay | Admitting: Urology

## 2017-01-14 ENCOUNTER — Ambulatory Visit (HOSPITAL_COMMUNITY)
Admission: RE | Admit: 2017-01-14 | Discharge: 2017-01-14 | Disposition: A | Discharge status: Home / Self Care | Payer: BLUE CROSS/BLUE SHIELD | Source: Ambulatory Visit | Attending: Interventional Radiology | Admitting: Interventional Radiology

## 2017-01-14 ENCOUNTER — Other Ambulatory Visit (HOSPITAL_COMMUNITY): Payer: Self-pay | Admitting: Interventional Radiology

## 2017-01-14 DIAGNOSIS — I700 Atherosclerosis of aorta: Secondary | ICD-10-CM | POA: Diagnosis not present

## 2017-01-14 DIAGNOSIS — C679 Malignant neoplasm of bladder, unspecified: Secondary | ICD-10-CM

## 2017-01-14 DIAGNOSIS — N200 Calculus of kidney: Secondary | ICD-10-CM

## 2017-01-14 DIAGNOSIS — N132 Hydronephrosis with renal and ureteral calculous obstruction: Secondary | ICD-10-CM | POA: Diagnosis not present

## 2017-01-14 DIAGNOSIS — Z436 Encounter for attention to other artificial openings of urinary tract: Secondary | ICD-10-CM | POA: Insufficient documentation

## 2017-01-14 DIAGNOSIS — Z8551 Personal history of malignant neoplasm of bladder: Secondary | ICD-10-CM | POA: Diagnosis not present

## 2017-01-14 HISTORY — PX: IR CATHETER TUBE CHANGE: IMG717

## 2017-01-14 LAB — BASIC METABOLIC PANEL
Anion gap: 9 (ref 5–15)
BUN: 40 mg/dL — ABNORMAL HIGH (ref 6–20)
CHLORIDE: 105 mmol/L (ref 101–111)
CO2: 19 mmol/L — AB (ref 22–32)
Calcium: 8.3 mg/dL — ABNORMAL LOW (ref 8.9–10.3)
Creatinine, Ser: 2.81 mg/dL — ABNORMAL HIGH (ref 0.61–1.24)
GFR calc non Af Amer: 22 mL/min — ABNORMAL LOW (ref >60)
GFR, EST AFRICAN AMERICAN: 26 mL/min — AB (ref >60)
Glucose, Bld: 95 mg/dL (ref 65–99)
POTASSIUM: 4.5 mmol/L (ref 3.5–5.1)
Sodium: 133 mmol/L — ABNORMAL LOW (ref 135–145)

## 2017-01-14 LAB — PROTIME-INR
INR: 1
Prothrombin Time: 13.2 seconds (ref 11.4–15.2)

## 2017-01-14 LAB — CBC WITH DIFFERENTIAL/PLATELET
BASOS ABS: 0.1 10*3/uL (ref 0.0–0.1)
BASOS PCT: 1 %
Eosinophils Absolute: 0.4 10*3/uL (ref 0.0–0.7)
Eosinophils Relative: 4 %
HCT: 44.3 % (ref 39.0–52.0)
Hemoglobin: 15.1 g/dL (ref 13.0–17.0)
LYMPHS PCT: 22 %
Lymphs Abs: 2.4 10*3/uL (ref 0.7–4.0)
MCH: 29.7 pg (ref 26.0–34.0)
MCHC: 34.1 g/dL (ref 30.0–36.0)
MCV: 87 fL (ref 78.0–100.0)
Monocytes Absolute: 1.1 10*3/uL — ABNORMAL HIGH (ref 0.1–1.0)
Monocytes Relative: 10 %
NEUTROS PCT: 63 %
Neutro Abs: 6.8 10*3/uL (ref 1.7–7.7)
Platelets: 253 10*3/uL (ref 150–400)
RBC: 5.09 MIL/uL (ref 4.22–5.81)
RDW: 15.6 % — ABNORMAL HIGH (ref 11.5–15.5)
WBC: 10.8 10*3/uL — AB (ref 4.0–10.5)

## 2017-01-14 MED ORDER — IOPAMIDOL (ISOVUE-300) INJECTION 61%
INTRAVENOUS | Status: AC
  Filled 2017-01-14: qty 50

## 2017-01-14 MED ORDER — SODIUM CHLORIDE 0.9 % IV SOLN
INTRAVENOUS | Status: DC
  Administered 2017-01-14: 10:00:00 via INTRAVENOUS

## 2017-01-14 MED ORDER — IOPAMIDOL (ISOVUE-300) INJECTION 61%
10.0000 mL | Freq: Once | INTRAVENOUS | Status: AC | PRN
  Administered 2017-01-14: 30 mL

## 2017-01-14 MED ORDER — CIPROFLOXACIN IN D5W 400 MG/200ML IV SOLN: 400.0000 mg | INTRAVENOUS | Status: DC

## 2017-01-14 NOTE — Procedures (Signed)
Pre Procedure Dx: Hydronephrosis Post Procedure Dx: Same  Successful left sided retrograde NUS salvage and exchange.    EBL: None   No immediate complications.   Ronny Bacon, MD Pager #: [phone]

## 2017-02-01 ENCOUNTER — Other Ambulatory Visit (HOSPITAL_COMMUNITY): Payer: BLUE CROSS/BLUE SHIELD

## 2017-02-13 ENCOUNTER — Encounter (HOSPITAL_COMMUNITY): Payer: Self-pay | Admitting: Internal Medicine

## 2017-02-13 ENCOUNTER — Emergency Department (HOSPITAL_COMMUNITY): Payer: BLUE CROSS/BLUE SHIELD

## 2017-02-13 ENCOUNTER — Inpatient Hospital Stay (HOSPITAL_COMMUNITY)
Admission: EM | Admit: 2017-02-13 | Discharge: 2017-02-19 | DRG: 699 | Disposition: A | Discharge status: Home / Self Care | Payer: BLUE CROSS/BLUE SHIELD | Attending: Internal Medicine | Admitting: Internal Medicine

## 2017-02-13 DIAGNOSIS — K449 Diaphragmatic hernia without obstruction or gangrene: Secondary | ICD-10-CM | POA: Diagnosis present

## 2017-02-13 DIAGNOSIS — I708 Atherosclerosis of other arteries: Secondary | ICD-10-CM | POA: Diagnosis present

## 2017-02-13 DIAGNOSIS — N179 Acute kidney failure, unspecified: Secondary | ICD-10-CM | POA: Diagnosis present

## 2017-02-13 DIAGNOSIS — N189 Chronic kidney disease, unspecified: Secondary | ICD-10-CM

## 2017-02-13 DIAGNOSIS — N183 Chronic kidney disease, stage 3 unspecified: Secondary | ICD-10-CM | POA: Diagnosis present

## 2017-02-13 DIAGNOSIS — Z906 Acquired absence of other parts of urinary tract: Secondary | ICD-10-CM

## 2017-02-13 DIAGNOSIS — I480 Paroxysmal atrial fibrillation: Secondary | ICD-10-CM | POA: Diagnosis present

## 2017-02-13 DIAGNOSIS — T83518A Infection and inflammatory reaction due to other urinary catheter, initial encounter: Secondary | ICD-10-CM | POA: Diagnosis present

## 2017-02-13 DIAGNOSIS — N132 Hydronephrosis with renal and ureteral calculous obstruction: Secondary | ICD-10-CM | POA: Diagnosis present

## 2017-02-13 DIAGNOSIS — G8929 Other chronic pain: Secondary | ICD-10-CM | POA: Diagnosis present

## 2017-02-13 DIAGNOSIS — Z8051 Family history of malignant neoplasm of kidney: Secondary | ICD-10-CM

## 2017-02-13 DIAGNOSIS — Z8249 Family history of ischemic heart disease and other diseases of the circulatory system: Secondary | ICD-10-CM

## 2017-02-13 DIAGNOSIS — Z8551 Personal history of malignant neoplasm of bladder: Secondary | ICD-10-CM | POA: Diagnosis not present

## 2017-02-13 DIAGNOSIS — Z9114 Patient's other noncompliance with medication regimen: Secondary | ICD-10-CM

## 2017-02-13 DIAGNOSIS — K5900 Constipation, unspecified: Secondary | ICD-10-CM

## 2017-02-13 DIAGNOSIS — N1330 Unspecified hydronephrosis: Secondary | ICD-10-CM | POA: Diagnosis not present

## 2017-02-13 DIAGNOSIS — I129 Hypertensive chronic kidney disease with stage 1 through stage 4 chronic kidney disease, or unspecified chronic kidney disease: Secondary | ICD-10-CM | POA: Diagnosis present

## 2017-02-13 DIAGNOSIS — C679 Malignant neoplasm of bladder, unspecified: Secondary | ICD-10-CM | POA: Diagnosis not present

## 2017-02-13 DIAGNOSIS — F1020 Alcohol dependence, uncomplicated: Secondary | ICD-10-CM | POA: Diagnosis present

## 2017-02-13 DIAGNOSIS — Z87442 Personal history of urinary calculi: Secondary | ICD-10-CM

## 2017-02-13 DIAGNOSIS — I700 Atherosclerosis of aorta: Secondary | ICD-10-CM | POA: Diagnosis present

## 2017-02-13 DIAGNOSIS — J9811 Atelectasis: Secondary | ICD-10-CM | POA: Diagnosis present

## 2017-02-13 DIAGNOSIS — F17210 Nicotine dependence, cigarettes, uncomplicated: Secondary | ICD-10-CM | POA: Diagnosis present

## 2017-02-13 DIAGNOSIS — T83512A Infection and inflammatory reaction due to nephrostomy catheter, initial encounter: Secondary | ICD-10-CM | POA: Diagnosis not present

## 2017-02-13 DIAGNOSIS — K219 Gastro-esophageal reflux disease without esophagitis: Secondary | ICD-10-CM | POA: Diagnosis present

## 2017-02-13 DIAGNOSIS — Z936 Other artificial openings of urinary tract status: Secondary | ICD-10-CM | POA: Diagnosis not present

## 2017-02-13 DIAGNOSIS — T83511A Infection and inflammatory reaction due to indwelling urethral catheter, initial encounter: Secondary | ICD-10-CM

## 2017-02-13 DIAGNOSIS — Y838 Other surgical procedures as the cause of abnormal reaction of the patient, or of later complication, without mention of misadventure at the time of the procedure: Secondary | ICD-10-CM | POA: Diagnosis present

## 2017-02-13 DIAGNOSIS — Z955 Presence of coronary angioplasty implant and graft: Secondary | ICD-10-CM | POA: Diagnosis not present

## 2017-02-13 DIAGNOSIS — N390 Urinary tract infection, site not specified: Secondary | ICD-10-CM | POA: Diagnosis not present

## 2017-02-13 DIAGNOSIS — I252 Old myocardial infarction: Secondary | ICD-10-CM | POA: Diagnosis not present

## 2017-02-13 DIAGNOSIS — Z79899 Other long term (current) drug therapy: Secondary | ICD-10-CM | POA: Diagnosis not present

## 2017-02-13 DIAGNOSIS — I2510 Atherosclerotic heart disease of native coronary artery without angina pectoris: Secondary | ICD-10-CM | POA: Diagnosis present

## 2017-02-13 HISTORY — PX: IR NEPHROSTOMY EXCHANGE LEFT: IMG6069

## 2017-02-13 HISTORY — DX: Paroxysmal atrial fibrillation: I48.0

## 2017-02-13 LAB — COMPREHENSIVE METABOLIC PANEL
ALBUMIN: 3.3 g/dL — AB (ref 3.5–5.0)
ALT: 65 U/L — ABNORMAL HIGH (ref 17–63)
ANION GAP: 11 (ref 5–15)
AST: 35 U/L (ref 15–41)
Alkaline Phosphatase: 183 U/L — ABNORMAL HIGH (ref 38–126)
BUN: 120 mg/dL — ABNORMAL HIGH (ref 6–20)
CHLORIDE: 105 mmol/L (ref 101–111)
CO2: 18 mmol/L — ABNORMAL LOW (ref 22–32)
Calcium: 8.8 mg/dL — ABNORMAL LOW (ref 8.9–10.3)
Creatinine, Ser: 5.44 mg/dL — ABNORMAL HIGH (ref 0.61–1.24)
GFR calc Af Amer: 12 mL/min — ABNORMAL LOW (ref >60)
GFR calc non Af Amer: 10 mL/min — ABNORMAL LOW (ref >60)
GLUCOSE: 122 mg/dL — AB (ref 65–99)
POTASSIUM: 4.6 mmol/L (ref 3.5–5.1)
SODIUM: 134 mmol/L — AB (ref 135–145)
TOTAL PROTEIN: 8.7 g/dL — AB (ref 6.5–8.1)
Total Bilirubin: 0.5 mg/dL (ref 0.3–1.2)

## 2017-02-13 LAB — URINALYSIS, ROUTINE W REFLEX MICROSCOPIC
Bilirubin Urine: NEGATIVE
Glucose, UA: 50 mg/dL — AB
Ketones, ur: NEGATIVE mg/dL
NITRITE: NEGATIVE
PROTEIN: 100 mg/dL — AB
SPECIFIC GRAVITY, URINE: 1.01 (ref 1.005–1.030)
SQUAMOUS EPITHELIAL / LPF: NONE SEEN
pH: 7 (ref 5.0–8.0)

## 2017-02-13 LAB — CBC
HEMATOCRIT: 39.5 % (ref 39.0–52.0)
HEMOGLOBIN: 13.7 g/dL (ref 13.0–17.0)
MCH: 29.1 pg (ref 26.0–34.0)
MCHC: 34.7 g/dL (ref 30.0–36.0)
MCV: 84 fL (ref 78.0–100.0)
Platelets: 354 10*3/uL (ref 150–400)
RBC: 4.7 MIL/uL (ref 4.22–5.81)
RDW: 15.6 % — AB (ref 11.5–15.5)
WBC: 14.9 10*3/uL — ABNORMAL HIGH (ref 4.0–10.5)

## 2017-02-13 LAB — LIPASE, BLOOD: LIPASE: 52 U/L — AB (ref 11–51)

## 2017-02-13 MED ORDER — SODIUM CHLORIDE 0.9 % IV BOLUS (SEPSIS)
1000.0000 mL | Freq: Once | INTRAVENOUS | Status: AC
  Administered 2017-02-13: 1000 mL via INTRAVENOUS

## 2017-02-13 MED ORDER — ACETAMINOPHEN 650 MG RE SUPP: 650.0000 mg | Freq: Four times a day (QID) | RECTAL | Status: DC | PRN

## 2017-02-13 MED ORDER — ATORVASTATIN CALCIUM 40 MG PO TABS
40.0000 mg | ORAL_TABLET | Freq: Every day | ORAL | Status: DC
  Administered 2017-02-13 – 2017-02-18 (×6): 40 mg via ORAL
  Filled 2017-02-13 (×6): qty 1

## 2017-02-13 MED ORDER — POLYETHYLENE GLYCOL 3350 17 G PO PACK
17.0000 g | PACK | Freq: Two times a day (BID) | ORAL | Status: DC
  Administered 2017-02-13 – 2017-02-19 (×8): 17 g via ORAL
  Filled 2017-02-13 (×10): qty 1

## 2017-02-13 MED ORDER — IOPAMIDOL (ISOVUE-300) INJECTION 61%
INTRAVENOUS | Status: AC
  Filled 2017-02-13: qty 30

## 2017-02-13 MED ORDER — ASPIRIN EC 81 MG PO TBEC
81.0000 mg | DELAYED_RELEASE_TABLET | Freq: Every day | ORAL | Status: DC
  Administered 2017-02-13 – 2017-02-19 (×7): 81 mg via ORAL
  Filled 2017-02-13 (×7): qty 1

## 2017-02-13 MED ORDER — NITROGLYCERIN 0.4 MG SL SUBL: 0.4000 mg | SUBLINGUAL_TABLET | SUBLINGUAL | Status: DC | PRN

## 2017-02-13 MED ORDER — ONDANSETRON HCL 4 MG PO TABS: 4.0000 mg | ORAL_TABLET | Freq: Four times a day (QID) | ORAL | Status: DC | PRN

## 2017-02-13 MED ORDER — ACETAMINOPHEN 325 MG PO TABS
650.0000 mg | ORAL_TABLET | Freq: Four times a day (QID) | ORAL | Status: DC | PRN
  Administered 2017-02-18: 650 mg via ORAL
  Filled 2017-02-13: qty 2

## 2017-02-13 MED ORDER — IOPAMIDOL (ISOVUE-300) INJECTION 61%
15.0000 mL | Freq: Once | INTRAVENOUS | Status: AC | PRN
  Administered 2017-02-13: 15 mL via ORAL

## 2017-02-13 MED ORDER — MORPHINE SULFATE (PF) 2 MG/ML IV SOLN
4.0000 mg | Freq: Once | INTRAVENOUS | Status: AC
  Administered 2017-02-13: 4 mg via INTRAVENOUS
  Filled 2017-02-13: qty 2

## 2017-02-13 MED ORDER — HYDROMORPHONE HCL 1 MG/ML IJ SOLN
0.5000 mg | Freq: Once | INTRAMUSCULAR | Status: AC
  Administered 2017-02-13: 0.5 mg via INTRAVENOUS
  Filled 2017-02-13: qty 1

## 2017-02-13 MED ORDER — IOPAMIDOL (ISOVUE-300) INJECTION 61%
50.0000 mL | Freq: Once | INTRAVENOUS | Status: AC | PRN
  Administered 2017-02-13: 5 mL

## 2017-02-13 MED ORDER — PIPERACILLIN-TAZOBACTAM 3.375 G IVPB 30 MIN
3.3750 g | Freq: Once | INTRAVENOUS | Status: AC
  Administered 2017-02-13: 3.375 g via INTRAVENOUS
  Filled 2017-02-13: qty 50

## 2017-02-13 MED ORDER — PANTOPRAZOLE SODIUM 40 MG PO TBEC
40.0000 mg | DELAYED_RELEASE_TABLET | Freq: Two times a day (BID) | ORAL | Status: DC
  Administered 2017-02-13 – 2017-02-19 (×13): 40 mg via ORAL
  Filled 2017-02-13 (×13): qty 1

## 2017-02-13 MED ORDER — PIPERACILLIN-TAZOBACTAM IN DEX 2-0.25 GM/50ML IV SOLN
2.2500 g | Freq: Three times a day (TID) | INTRAVENOUS | Status: DC
  Administered 2017-02-13 – 2017-02-16 (×8): 2.25 g via INTRAVENOUS
  Filled 2017-02-13 (×9): qty 50

## 2017-02-13 MED ORDER — DEXTROSE-NACL 5-0.45 % IV SOLN
INTRAVENOUS | Status: DC
  Administered 2017-02-13 – 2017-02-17 (×9): via INTRAVENOUS

## 2017-02-13 MED ORDER — LIDOCAINE HCL 1 % IJ SOLN
INTRAMUSCULAR | Status: AC
  Filled 2017-02-13: qty 20

## 2017-02-13 MED ORDER — HYDROMORPHONE HCL 1 MG/ML IJ SOLN
0.5000 mg | INTRAMUSCULAR | Status: DC | PRN
  Administered 2017-02-13: 0.5 mg via INTRAVENOUS
  Filled 2017-02-13: qty 1

## 2017-02-13 MED ORDER — ONDANSETRON HCL 4 MG/2ML IJ SOLN
4.0000 mg | Freq: Once | INTRAMUSCULAR | Status: AC
Start: 2017-02-13 — End: 2017-02-13
  Administered 2017-02-13: 4 mg via INTRAVENOUS
  Filled 2017-02-13: qty 2

## 2017-02-13 MED ORDER — IOPAMIDOL (ISOVUE-300) INJECTION 61%
INTRAVENOUS | Status: AC
  Administered 2017-02-13: 5 mL
  Filled 2017-02-13: qty 50

## 2017-02-13 MED ORDER — ALUM & MAG HYDROXIDE-SIMETH 200-200-20 MG/5ML PO SUSP
30.0000 mL | Freq: Four times a day (QID) | ORAL | Status: DC | PRN
  Administered 2017-02-13 – 2017-02-15 (×2): 30 mL via ORAL
  Filled 2017-02-13 (×2): qty 30

## 2017-02-13 MED ORDER — OXYCODONE HCL 5 MG PO TABS
10.0000 mg | ORAL_TABLET | ORAL | Status: DC | PRN
Start: 2017-02-13 — End: 2017-02-19
  Administered 2017-02-14 – 2017-02-19 (×23): 10 mg via ORAL
  Filled 2017-02-13 (×24): qty 2

## 2017-02-13 MED ORDER — CALCIUM CARBONATE ANTACID 500 MG PO CHEW
2.0000 | CHEWABLE_TABLET | Freq: Once | ORAL | Status: AC
  Administered 2017-02-13: 400 mg via ORAL
  Filled 2017-02-13: qty 2

## 2017-02-13 MED ORDER — ENSURE ENLIVE PO LIQD
237.0000 mL | Freq: Two times a day (BID) | ORAL | Status: DC
  Administered 2017-02-13 – 2017-02-19 (×9): 237 mL via ORAL

## 2017-02-13 MED ORDER — HYDROMORPHONE HCL 1 MG/ML IJ SOLN
0.5000 mg | INTRAMUSCULAR | Status: DC | PRN
  Administered 2017-02-13 – 2017-02-15 (×5): 0.5 mg via INTRAVENOUS
  Filled 2017-02-13 (×5): qty 1

## 2017-02-13 MED ORDER — ONDANSETRON HCL 4 MG/2ML IJ SOLN: 4.0000 mg | Freq: Four times a day (QID) | INTRAMUSCULAR | Status: DC | PRN

## 2017-02-13 MED ORDER — BISACODYL 10 MG RE SUPP
10.0000 mg | Freq: Once | RECTAL | Status: DC
  Filled 2017-02-13 (×2): qty 1

## 2017-02-13 MED ORDER — CARVEDILOL 3.125 MG PO TABS
3.1250 mg | ORAL_TABLET | Freq: Two times a day (BID) | ORAL | Status: DC
  Administered 2017-02-13 – 2017-02-19 (×12): 3.125 mg via ORAL
  Filled 2017-02-13 (×12): qty 1

## 2017-02-13 NOTE — Procedures (Signed)
Urosepsis  Obstructed left nephroureteral tube  S/p fluoro exchg of the left nephroureteral tube thru the urostomy  No comp Full report in pacs

## 2017-02-13 NOTE — ED Notes (Signed)
Patient transported to CT 

## 2017-02-13 NOTE — H&P (Addendum)
History and Physical    Darren Carr INO:[id number] DOB: [birth year] DOA: 02/13/2017  Referring MD/NP/PA: Theotis Burrow PCP: [name], DO  Outpatient Specialists: Dr. Tresa Moore Urologist Patient coming from: home  Chief Complaint: abdominal pain  HPI: Darren Carr is a 66 y.o. male with history of coronary artery disease, hypertension, paroxysmal atrial fibrillation, and metastatic bladder cancer status post robotic cystoprostatectomy with lymphadenectomy and ileal conduit urinary diversion in June 2016, left hydronephrosis with chronic left nephroureteral catheter that drains into his urostomy (last exchange on 5/3), and kidney stones who presents with left upper quadrant abdominal pain, nausea and vomiting.  Last week, he had some right upper quadrant and flank pain, but one week ago, the pain "moved" to the left side.  Pain has became severe 8/10, constant, and radiates to right side, lower abdominal area and to the low back.  Decreased uop and cloudy with pink-red tinge over the last few days.  He has been having increased acid reflux with nausea and vomiting 2-3 times per day.  Little streaks of blood but no coffee grounds or bile in emesis.  No BM in 4-5 day and last BM was small with pellets.  He has a constant knot at bottom of chest worse with inspiration, but not associated with SOB, worsening nausea, or dyspnea and similar to indigestion.   Endorses chills but no fevers.  Increased DOE and weakness this week.  No swelling in ankles or feet and denies calf pain.    ED Course: In the ER, vital signs are stable.  WBC 14.9.  BUN 120 and creatinine 5.44 (baseline 2.7-2.8).  UA c/w infection with TNTC WBC, many bacteria, large LE.  Urine culture is pending.  CT demonstrates development of left hydroureteral nephrosis which suggests that the left nephroureteral catheter is clogged.  Catheter appears to be in stable position.    Review of Systems:  General:  Denies fevers, positive chills,  weight loss or gain.  Hiccoughs and heart burn HEENT:  Denies changes to hearing and vision, rhinorrhea, sinus congestion, sore throat CV:  Denies chest pain and palpitations, lower extremity edema.  PULM:  Denies SOB, wheezing, cough.   GI:  Denies nausea, vomiting, constipation, diarrhea.   GU:  Cloudy urine with decreased uop ENDO:  Denies polyuria, polydipsia.   HEME:  Denies hematemesis, blood in stools, melena, abnormal bruising, but having some blood in emesis and in urine. LYMPH:  Denies lymphadenopathy.   MSK:  Denies arthralgias, myalgias.   DERM:  Denies skin rash or ulcer.   NEURO:  Denies focal numbness, weakness, slurred speech, confusion, facial droop.  PSYCH:  Denies anxiety and depression.    Past Medical History:  Diagnosis Date  . Acute MI, lateral wall ([place]) 02/23/2015  . Alcoholic ([place])   . 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 Bladder cancer ([place]) 07/2014  . Chronic back pain   . Coronary artery disease   . GERD (gastroesophageal reflux disease)   . History of blood transfusion    "related to bleeding ulcers on my esophagus"  . History of kidney stones   . Hypertension   . Kidney stones   . Paroxysmal atrial fibrillation (HCC)   . Upper GI bleed     Past Surgical History:  Procedure Laterality Date  . APPENDECTOMY    . BALLOON DILATION N/A 08/16/2013   Procedure: BALLOON DILATION to 16.20mm;  Surgeon: Rogene Houston, MD;  Location: [facility];  Service: Endoscopy;  Laterality: N/A;  .  BIOPSY  08/16/2013   Procedure: DISTAL ESOPHAGEAL BIOPSIES;  Surgeon: Rogene Houston, MD;  Location: [facility];  Service: Endoscopy;;  . CARDIAC CATHETERIZATION N/A 02/23/2015   Procedure: Left Heart Cath and Coronary Angiography;  Surgeon: Charolette Forward, MD;  Location: [place] [facility];  Service: Cardiovascular;  Laterality: N/A;  . CARDIAC CATHETERIZATION N/A 04/25/2015   Procedure: Coronary Stent Intervention;  Surgeon: Charolette Forward, MD;  Location: [place]  [facility];  Service: Cardiovascular;  Laterality: N/A;  . CORONARY ANGIOPLASTY    . CYSTOSCOPY W/ URETERAL STENT PLACEMENT Bilateral 12/05/2014   Procedure: CYSTOSCOPY WITH BILATERAL RETROGRADE PYELOGRAM;  Surgeon: [name], MD;  Location: [facility];  Service: Urology;  Laterality: Bilateral;  . CYSTOSCOPY WITH INJECTION N/A 01/16/2015   Procedure: CYSTOSCOPY WITH INJECTION OF INDOCYANINE GREEN DYE;  Surgeon: [name], MD;  Location: [facility];  Service: Urology;  Laterality: N/A;  . ESOPHAGOGASTRODUODENOSCOPY N/A 02/24/2015   Procedure: ESOPHAGOGASTRODUODENOSCOPY (EGD);  Surgeon: Wilford Corner, MD;  Location[facility];  Service: Endoscopy;  Laterality: N/A;  . ESOPHAGOGASTRODUODENOSCOPY (EGD) WITH PROPOFOL N/A 08/16/2013   Procedure: ESOPHAGOGASTRODUODENOSCOPY (EGD) WITH PROPOFOL Hiatus at 40cm Gastroesophageal Junction at 37cm;  Surgeon: Rogene Houston, MD;  Location: [facility];  Service: Endoscopy;  Laterality: N/A;  . HOLMIUM LASER APPLICATION Right 10/19/4268   Procedure: HOLMIUM LASER APPLICATION;  Surgeon: [name], MD;  [facility];  Service: Urology;  Laterality: Right;  . IR CATHETER TUBE CHANGE  12/28/2016  . IR CATHETER TUBE CHANGE  01/14/2017  . IR GENERIC HISTORICAL  04/29/2016   IR CATHETER TUBE CHANGE 04/29/2016 [facility]  . IR GENERIC HISTORICAL  06/10/2016   IR CATHETER TUBE CHANGE 06/10/2016 Jacqulynn Cadet, MD [facility]  . IR GENERIC HISTORICAL  07/15/2016   IR CATHETER TUBE CHANGE 07/15/2016 Marybelle Killings, MD [facility]  . IR GENERIC HISTORICAL  08/10/2016   IR NEPHROSTOMY PLACEMENT RIGHT 08/10/2016 Aletta Edouard, MD [facility]  . IR GENERIC HISTORICAL  08/11/2016   IR NEPHROSTOMY TUBE CHANGE 08/11/2016 Arne Cleveland, MD [facility]  . IR GENERIC HISTORICAL  09/08/2016   IR NEPHROSTOMY EXCHANGE RIGHT 09/08/2016 Marybelle Killings, MD [facility]  . IR GENERIC HISTORICAL  09/08/2016   IR CATHETER TUBE CHANGE 09/08/2016 Marybelle Killings, MD [facility]  . IR  GENERIC HISTORICAL  10/13/2016   IR CATHETER TUBE CHANGE 10/13/2016 Jacqulynn Cadet, MD [facility]  . IR GENERIC HISTORICAL  11/23/2016   IR CATHETER TUBE CHANGE 11/23/2016 Greggory Keen, MD [facility]  . LYMPHADENECTOMY Bilateral 01/16/2015   Procedure: PELVIC LYMPH NODE DISSECTION;  Surgeon: [name], MD;  [facility];  Service: Urology;  Laterality: Bilateral;  . NEPHROLITHOTOMY Right 10/14/2016   Procedure: 1ST STAGE NEPHROLITHOTOMY PERCUTANEOUS ureteroscopy with stone basket and ureter biopsy right;  Surgeon: [name], MD;  [facility];  Service: Urology;  Laterality: Right;  . NEPHROLITHOTOMY Right 10/16/2016   Procedure: NEPHROLITHOTOMY PERCUTANEOUS SECOND STAGE , ANTEGRADE NEPHROSTOGRAM;  Surgeon: [name], MD;  [facility];  Service: Urology;  Laterality: Right;  . ROBOT ASSISTED LAPAROSCOPIC COMPLETE CYSTECT ILEAL CONDUIT N/A 01/16/2015   Procedure: ROBOTIC ASSISTED LAPAROSCOPIC COMPLETE CYSTECT ILEAL CONDUIT/ROBOTIC ASSISTED LAPAROSCOPIC RADICAL PROSTATECTOMY;  Surgeon: [name], MD;  [facility];  Service: Urology;  Laterality: N/A;  . TRANSURETHRAL RESECTION OF BLADDER TUMOR WITH GYRUS (TURBT-GYRUS) N/A 12/05/2014   Procedure: TRANSURETHRAL RESECTION OF BLADDER TUMOR WITH GYRUS (TURBT-GYRUS);  Surgeon: [name], MD;  [facility];  Service: Urology;  Laterality: N/A;  .  TRANSURETHRAL RESECTION OF PROSTATE  07-2014,08-2014,09-2014     reports that he has been smoking Cigarettes.  He has a 73.50 pack-year smoking history. He has never used smokeless tobacco. He reports that he does not drink alcohol or use drugs.  No Known Allergies  Family History  Problem Relation Age of Onset  . Lung cancer Mother   . Heart attack Father        MI at 73 and died  . Rheumatic fever Father   . Kidney cancer Sister     Prior to Admission medications   Medication Sig Start Date End Date Taking? Authorizing Provider  Oxycodone HCl 10 MG TABS  Take 1 tablet (10 mg total) by mouth every 4 (four) hours as needed (pain). 10/17/16  Yes Lolita Rieger, MD  amiodarone (PACERONE) 200 MG tablet Take 1 tablet (200 mg total) by mouth daily. Patient not taking: Reported on 02/13/2017 08/21/16   Charolette Forward, MD  atorvastatin (LIPITOR) 40 MG tablet Take 1 tablet (40 mg total) by mouth daily at 6 PM. Patient not taking: Reported on 10/12/2016 03/09/15   Charolette Forward, MD  carvedilol (COREG) 3.125 MG tablet Take 1 tablet (3.125 mg total) by mouth 2 (two) times daily with a meal. Patient not taking: Reported on 10/12/2016 03/09/15   Charolette Forward, MD  lisinopril (PRINIVIL,ZESTRIL) 2.5 MG tablet Take 1 tablet (2.5 mg total) by mouth daily. Patient not taking: Reported on 10/12/2016 04/25/15   Charolette Forward, MD  nitroGLYCERIN (NITROSTAT) 0.4 MG SL tablet Place 1 tablet (0.4 mg total) under the tongue every 5 (five) minutes x 3 doses as needed for chest pain. 03/09/15   Charolette Forward, MD  pantoprazole (PROTONIX) 40 MG tablet Take 1 tablet (40 mg total) by mouth 2 (two) times daily. Patient not taking: Reported on 02/13/2017 03/09/15   Charolette Forward, MD    Physical Exam: Vitals:   02/13/17 1215 02/13/17 1230 02/13/17 1245 02/13/17 1253  BP:  134/89  134/89  Pulse: 69 70 63 74  Resp:    16  Temp:      TempSrc:      SpO2: 94% 97% 96% 95%  Weight:      Height:       Constitutional: NAD, calm, comfortable, uremic odor Eyes: PERRL, lids and conjunctivae normal ENMT: Mucous membranes are moist. Posterior pharynx with slightly erythematous tonsils without exudate  Neck: normal, supple, no masses, no thyromegaly Respiratory: clear to auscultation bilaterally, no wheezing, no crackles. Normal respiratory effort. No accessory muscle use.  Cardiovascular: Regular rate and rhythm, no murmurs / rubs / gallops. No extremity edema. 2+ pedal pulses.  Abdomen:  NABS, soft, mildly distended, TTP in the LUQ without rebound or guarding.  Urostomy bag with cloudy urine  and catheter extending into urostomy bag.   Musculoskeletal: no clubbing / cyanosis. No joint deformity upper and lower extremities. Good ROM, no contractures. Normal muscle tone.  Skin: no rashes, lesions, ulcers. No induration Neurologic: CN 2-12 grossly intact. Sensation intact, DTR normal. Strength 5/5 in all 4.  Psychiatric: Normal judgment and insight. Alert and oriented x 3. Normal mood.   Labs on Admission: I have personally reviewed following labs and imaging studies  CBC:  Recent Labs Lab 02/13/17 0753  WBC 14.9*  HGB 13.7  HCT 39.5  MCV 84.0  PLT 761   Basic Metabolic Panel:  Recent Labs Lab 02/13/17 0753  NA 134*  K 4.6  CL 105  CO2 18*  GLUCOSE 122*  BUN  120*  CREATININE 5.44*  CALCIUM 8.8*   GFR: Estimated Creatinine Clearance: 13.8 mL/min (A) (by C-G formula based on SCr of 5.44 mg/dL (H)). Liver Function Tests:  Recent Labs Lab 02/13/17 0753  AST 35  ALT 65*  ALKPHOS 183*  BILITOT 0.5  PROT 8.7*  ALBUMIN 3.3*    Recent Labs Lab 02/13/17 0753  LIPASE 52*   No results for input(s): AMMONIA in the last 168 hours. Coagulation Profile: No results for input(s): INR, PROTIME in the last 168 hours. Cardiac Enzymes: No results for input(s): CKTOTAL, CKMB, CKMBINDEX, TROPONINI in the last 168 hours. BNP (last 3 results) No results for input(s): PROBNP in the last 8760 hours. HbA1C: No results for input(s): HGBA1C in the last 72 hours. CBG: No results for input(s): GLUCAP in the last 168 hours. Lipid Profile: No results for input(s): CHOL, HDL, LDLCALC, TRIG, CHOLHDL, LDLDIRECT in the last 72 hours. Thyroid Function Tests: No results for input(s): TSH, T4TOTAL, FREET4, T3FREE, THYROIDAB in the last 72 hours. Anemia Panel: No results for input(s): VITAMINB12, FOLATE, FERRITIN, TIBC, IRON, RETICCTPCT in the last 72 hours. Urine analysis:    Component Value Date/Time   COLORURINE AMBER (A) 02/13/2017 1144   APPEARANCEUR CLOUDY (A) 02/13/2017  1144   LABSPEC 1.010 02/13/2017 1144   PHURINE 7.0 02/13/2017 1144   GLUCOSEU 50 (A) 02/13/2017 1144   HGBUR MODERATE (A) 02/13/2017 1144   BILIRUBINUR NEGATIVE 02/13/2017 1144   KETONESUR NEGATIVE 02/13/2017 1144   PROTEINUR 100 (A) 02/13/2017 1144   UROBILINOGEN 0.2 02/23/2015 2220   NITRITE NEGATIVE 02/13/2017 1144   LEUKOCYTESUR LARGE (A) 02/13/2017 1144   Sepsis Labs: @LABRCNTIP (procalcitonin:4,lacticidven:4) )No results found for this or any previous visit (from the past 240 hour(s)).   Radiological Exams on Admission: Ct Abdomen Pelvis Wo Contrast  Result Date: 02/13/2017 CLINICAL DATA:  Bladder cancer, status post cystectomy and urostomy. Intermittent hematuria with abdominal pain and decreased appetite. Lower abdominal pain. EXAM: CT ABDOMEN AND PELVIS WITHOUT CONTRAST TECHNIQUE: Multidetector CT imaging of the abdomen and pelvis was performed following the standard protocol without IV contrast. COMPARISON:  01/14/2017 FINDINGS: Lower chest: Dependent bibasilar atelectasis. No pericardial or pleural effusion. No focal basilar pneumonia. Normal heart size. Moderate hiatal hernia noted. Hepatobiliary: No focal liver abnormality is seen. No gallstones, gallbladder wall thickening, or biliary dilatation. Pancreas: Unremarkable. No pancreatic ductal dilatation or surrounding inflammatory changes. Spleen: Normal in size without focal abnormality. Adrenals/Urinary Tract: Normal adrenal glands. Patient is status post cystectomy and right abdominal ileal conduit/urostomy. Through the urostomy, there is a left nephroureteral catheter with the retention loop in the left renal pelvis. Compared to the prior study, there is increased left hydroureteronephrosis. Chronic atrophy of the right kidney without obstruction or hydronephrosis. Punctate nonobstructing intrarenal calculi bilaterally. Stomach/Bowel: Negative for bowel obstruction, significant dilatation ileus, or free air. Postop changes of the  lower abdomen and pelvis. Appendix not visualized. Retained stool throughout right colon compatible with constipation. Vascular/Lymphatic: Atherosclerosis of the aorta and iliac vessels. Small scattered nonenlarged retroperitoneal lymph nodes. No adenopathy. Reproductive: Remote cystectomy and prostatectomy. Other: No inguinal or abdominal wall hernia. Musculoskeletal: Degenerative changes of the spine. No acute osseous finding. Bones are osteopenic. IMPRESSION: Remote cystectomy and right lower quadrant ileal conduit/urostomy. Chronic indwelling left nephroureteral catheter via the ostomy. Stable catheter position but there is interval development of left hydroureteronephrosis suggesting tubal occlusion. Bibasilar atelectasis Small hiatal hernia Nonobstructing intrarenal calculi bilaterally Aortoiliac atherosclerosis without aneurysm Electronically Signed   By: Jerilynn Mages.  Shick M.D.  On: 02/13/2017 10:36    EKG: pending  Assessment/Plan Principal Problem:   Acute renal failure superimposed on stage 3 chronic kidney disease (HCC) Active Problems:   Bladder cancer (HCC)   Hydronephrosis of left kidney   UTI (urinary tract infection) due to urinary indwelling catheter (HCC)   GERD (gastroesophageal reflux disease)   Constipation   Left hydronephrosis likely due to clogged nephroureteral catheter and likely contributing to UTI -  Urology consultation -  IR consultation  Acute on chronic kidney disease stage likely due to obstruction  -  Plan to exchange left nephroureteral catheter -  Minimize nephrotoxins -  Renally dose medications -  IVF -  Repeat BMP in AM  UTI with leukocytosis -  Urine culture  -  Zosyn started in ER  Coronary artery disease, STEMI in 08/2016 -  not taking aspirin, statin, or beta blocker, noncompliant with medications -  Restart beta blocker and statin and aspirin for now  Hypertension, blood pressures stable to mildly elevated -  Resume carvedilol  Paroxysmal  atrial fibrillation after STEMI in 08/2016, CHADs2vasc 2 -  ECG - noncompliant with eliquis and amiodarone - given noncompliance, will have patient follow up with his cardiologist Dr. Terrence Dupont to discuss resuming anticoagulation  metastatic bladder cancer, in remission.   -  Urology consultation  GERD -  Start PPI -  TUMS and maalox prn  Constipation -  Bisacodyl suppository -  miralax daily  DVT prophylaxis: SCDs pending procedure Code Status: full Family Communication: patient, wife at bedside, questions answered   Disposition Plan: likely home in 3 days  Consults called: Urology, Interventional Radiology  Admission status: inpatient, med-surg.  At risk for decompensation secondary to underlying coronary artery disease, progressive renal failure with creatinine > 5, and probable pyelonephritis with hydronephrosis due to blocked nephroureteral catheter that has yet to be exchanged.    Janece Canterbury MD [facility] Pager [phone]  If 7PM-7AM, please contact night-coverage [email or web address] Password [email or web address]  02/13/2017, 1:10 PM

## 2017-02-13 NOTE — ED Triage Notes (Signed)
Pt states he has hx of bladder ca x 2 years ago.  Bladder removed and urostomy placed.  Blood in urine off/on since.  Since May 25th, pt has had abdominal pain with blood in urine and decreased appetite.  Denies fever but has not checked at home.  No fever in triage.  Also has had some episodes of n/v in past 2 days.  Seen by primary MD with no dx.

## 2017-02-13 NOTE — ED Provider Notes (Addendum)
[facility] DEPT Provider Note   CSN: [id number] Arrival date & time: 02/13/17  8676     History   Chief Complaint Chief Complaint  Patient presents with  . Hematuria  . Abdominal Pain    HPI Darren Carr is a 66 y.o. male.  66yo M w/ extensive PMH including bladder CA s/p resection w/ urostomy, CAD s/p MI, HTN, alcohol use who p/w abdominal pain, N/V. Patient has had ongoing, chronic intermittent pain in his lower abdomen that seems to be worse recently. He has had intermittent hematuria in his urostomy bag since then and notes that it has been cloudy recently. Over the past 2 days he has had episodes of nausea and vomiting as well as decreased appetite. He reports no bowel movement in the past 4-5 days and prior to that constipation. He is not aware of any fevers. No chest pain or shortness of breath, no cough/cold symptoms. He was evaluated by his PCP recently for back pain without any specific diagnosis. He is taking his home oxycodone, 10 mg 4 times a day, with no relief of his pain.   The history is provided by the patient.  Hematuria  Associated symptoms include abdominal pain.  Abdominal Pain   Associated symptoms include hematuria.    Past Medical History:  Diagnosis Date  . Acute MI, lateral wall ([place]) 02/23/2015  . Alcoholic ([place])   . Anterior wall myocardial infarction ([place]) 02/23/2015  . Bladder cancer ([place]) 07/2014  . Chronic back pain   . Coronary artery disease   . GERD (gastroesophageal reflux disease)   . History of blood transfusion    "related to bleeding ulcers on my esophagus"  . History of kidney stones   . Hypertension   . Kidney stones   . Upper GI bleed     Patient Active Problem List   Diagnosis Date Noted  . Staghorn renal calculus 10/14/2016  . Chest pain 08/19/2016  . History of MI (myocardial infarction) 08/11/2016  . Nephrolithiasis 08/11/2016  . Acute renal failure ([place]) 08/11/2016  . Stage III chronic kidney disease 08/11/2016    . Metabolic acidosis 72/05/4708  . Hyperkalemia 08/10/2016  . Hydronephrosis of left kidney   . Bladder cancer ([place]) 01/16/2015  . ETOH abuse 07/25/2013  . Tobacco abuse 07/25/2013  . Dysphagia 07/25/2013    Past Surgical History:  Procedure Laterality Date  . APPENDECTOMY    . BALLOON DILATION N/A 08/16/2013   Procedure: BALLOON DILATION to 16.28mm;  Surgeon: Rogene Houston, MD;  Location: [facility];  Service: Endoscopy;  Laterality: N/A;  . BIOPSY  08/16/2013   Procedure: DISTAL ESOPHAGEAL BIOPSIES;  Surgeon: Rogene Houston, MD;  Location: [facility];  Service: Endoscopy;;  . CARDIAC CATHETERIZATION N/A 02/23/2015   Procedure: Left Heart Cath and Coronary Angiography;  Surgeon: Charolette Forward, MD;  Location: [facility];  Service: Cardiovascular;  Laterality: N/A;  . CARDIAC CATHETERIZATION N/A 04/25/2015   Procedure: Coronary Stent Intervention;  Surgeon: Charolette Forward, MD;  Location: [facility];  Service: Cardiovascular;  Laterality: N/A;  . CORONARY ANGIOPLASTY    . CYSTOSCOPY W/ URETERAL STENT PLACEMENT Bilateral 12/05/2014   Procedure: CYSTOSCOPY WITH BILATERAL RETROGRADE PYELOGRAM;  Surgeon: [name], MD;  Location: [facility];  Service: Urology;  Laterality: Bilateral;  . CYSTOSCOPY WITH INJECTION N/A 01/16/2015   Procedure: CYSTOSCOPY WITH INJECTION OF INDOCYANINE GREEN DYE;  Surgeon: [name], MD;  Location: [facility];  Service: Urology;  Laterality: N/A;  . ESOPHAGOGASTRODUODENOSCOPY  N/A 02/24/2015   Procedure: ESOPHAGOGASTRODUODENOSCOPY (EGD);  Surgeon: Wilford Corner, MD;  Location: [facility];  Service: Endoscopy;  Laterality: N/A;  . ESOPHAGOGASTRODUODENOSCOPY (EGD) WITH PROPOFOL N/A 08/16/2013   Procedure: ESOPHAGOGASTRODUODENOSCOPY (EGD) WITH PROPOFOL Hiatus at 40cm Gastroesophageal Junction at 37cm;  Surgeon: Rogene Houston, MD;  Location: [facility];  Service: Endoscopy;  Laterality: N/A;  . HOLMIUM LASER APPLICATION Right 03/19/2830   Procedure: HOLMIUM LASER  APPLICATION;  Surgeon: [name], MD;  Location: [facility];  Service: Urology;  Laterality: Right;  . IR CATHETER TUBE CHANGE  12/28/2016  . IR CATHETER TUBE CHANGE  01/14/2017  . IR GENERIC HISTORICAL  04/29/2016   IR CATHETER TUBE CHANGE 04/29/2016 [facility]  . IR GENERIC HISTORICAL  06/10/2016   IR CATHETER TUBE CHANGE 06/10/2016 Jacqulynn Cadet, MD [facility]  . IR GENERIC HISTORICAL  07/15/2016   IR CATHETER TUBE CHANGE 07/15/2016 Marybelle Killings, MD [facility]  . IR GENERIC HISTORICAL  08/10/2016   IR NEPHROSTOMY PLACEMENT RIGHT 08/10/2016 Aletta Edouard, MD [facility]  . IR GENERIC HISTORICAL  08/11/2016   IR NEPHROSTOMY TUBE CHANGE 08/11/2016 Arne Cleveland, MD [facility]  . IR GENERIC HISTORICAL  09/08/2016   IR NEPHROSTOMY EXCHANGE RIGHT 09/08/2016 Marybelle Killings, MD [facility]  . IR GENERIC HISTORICAL  09/08/2016   IR CATHETER TUBE CHANGE 09/08/2016 Marybelle Killings, MD [facility]  . IR GENERIC HISTORICAL  10/13/2016   IR CATHETER TUBE CHANGE 10/13/2016 Jacqulynn Cadet, MD [facility]  . IR GENERIC HISTORICAL  11/23/2016   IR CATHETER TUBE CHANGE 11/23/2016 Greggory Keen, MD [facility]  . LYMPHADENECTOMY Bilateral 01/16/2015   Procedure: PELVIC LYMPH NODE DISSECTION;  Surgeon: [name], MD;  Location: [facility];  Service: Urology;  Laterality: Bilateral;  . NEPHROLITHOTOMY Right 10/14/2016   Procedure: 1ST STAGE NEPHROLITHOTOMY PERCUTANEOUS ureteroscopy with stone basket and ureter biopsy right;  Surgeon: [name], MD;  Location: [facility];  Service: Urology;  Laterality: Right;  . NEPHROLITHOTOMY Right 10/16/2016   Procedure: NEPHROLITHOTOMY PERCUTANEOUS SECOND STAGE , ANTEGRADE NEPHROSTOGRAM;  Surgeon: [name], MD;  Location: [facility];  Service: Urology;  Laterality: Right;  . ROBOT ASSISTED LAPAROSCOPIC COMPLETE CYSTECT ILEAL CONDUIT N/A 01/16/2015   Procedure: ROBOTIC ASSISTED LAPAROSCOPIC COMPLETE CYSTECT ILEAL CONDUIT/ROBOTIC ASSISTED LAPAROSCOPIC RADICAL  PROSTATECTOMY;  Surgeon: [name], MD;  Location: [facility];  Service: Urology;  Laterality: N/A;  . TRANSURETHRAL RESECTION OF BLADDER TUMOR WITH GYRUS (TURBT-GYRUS) N/A 12/05/2014   Procedure: TRANSURETHRAL RESECTION OF BLADDER TUMOR WITH GYRUS (TURBT-GYRUS);  Surgeon: [name], MD;  Location: [facility];  Service: Urology;  Laterality: N/A;  . TRANSURETHRAL RESECTION OF PROSTATE  07-2014,08-2014,09-2014       Home Medications    Prior to Admission medications   Medication Sig Start Date End Date Taking? Authorizing Provider  Oxycodone HCl 10 MG TABS Take 1 tablet (10 mg total) by mouth every 4 (four) hours as needed (pain). 10/17/16  Yes Lolita Rieger, MD  amiodarone (PACERONE) 200 MG tablet Take 1 tablet (200 mg total) by mouth daily. Patient not taking: Reported on 02/13/2017 08/21/16   Charolette Forward, MD  atorvastatin (LIPITOR) 40 MG tablet Take 1 tablet (40 mg total) by mouth daily at 6 PM. Patient not taking: Reported on 10/12/2016 03/09/15   Charolette Forward, MD  carvedilol (COREG) 3.125 MG tablet Take 1 tablet (3.125 mg total) by mouth 2 (two) times daily with a meal. Patient not taking: Reported on 10/12/2016 03/09/15   Charolette Forward, MD  lisinopril (PRINIVIL,ZESTRIL) 2.5  MG tablet Take 1 tablet (2.5 mg total) by mouth daily. Patient not taking: Reported on 10/12/2016 04/25/15   Charolette Forward, MD  nitroGLYCERIN (NITROSTAT) 0.4 MG SL tablet Place 1 tablet (0.4 mg total) under the tongue every 5 (five) minutes x 3 doses as needed for chest pain. 03/09/15   Charolette Forward, MD  pantoprazole (PROTONIX) 40 MG tablet Take 1 tablet (40 mg total) by mouth 2 (two) times daily. Patient not taking: Reported on 02/13/2017 03/09/15   Charolette Forward, MD    Family History No family history on file.  Social History Social History  Substance Use Topics  . Smoking status: Current Every Day Smoker    Packs/day: 1.50    Years: 49.00    Types: Cigarettes  . Smokeless tobacco: Never Used  . Alcohol  use No     Comment: quit 3-4 months      Allergies   Patient has no known allergies.   Review of Systems Review of Systems  Gastrointestinal: Positive for abdominal pain.  Genitourinary: Positive for hematuria.   All other systems reviewed and are negative except that which was mentioned in HPI  Physical Exam Updated Vital Signs BP (!) 123/96 (BP Location: Right Arm)   Pulse 75   Temp 98.2 F (36.8 C) (Oral)   Resp 16   Ht 5\' 10"  (1.778 m)   Wt 71.2 kg (157 lb)   SpO2 96%   BMI 22.53 kg/m   Physical Exam  Constitutional: He is oriented to person, place, and time. No distress.  Chronically ill appearing, disheveled male awake and alert  HENT:  Head: Normocephalic and atraumatic.  dry mucous membranes  Eyes: Conjunctivae are normal. Pupils are equal, round, and reactive to light.  Neck: Neck supple.  Cardiovascular: Normal rate, regular rhythm and normal heart sounds.   No murmur heard. Pulmonary/Chest: Effort normal and breath sounds normal.  Abdominal: Soft. Bowel sounds are normal. He exhibits no distension. There is tenderness.  Cloudy, foul smelling urine in urostomy bag; tenderness in RLQ and suprapubic abdomen around bag  Musculoskeletal: He exhibits no edema.  Neurological: He is alert and oriented to person, place, and time.  Fluent speech  Skin: Skin is warm and dry.  Psychiatric: He has a normal mood and affect. Judgment normal.  Nursing note and vitals reviewed.    ED Treatments / Results  Labs (all labs ordered are listed, but only abnormal results are displayed) Labs Reviewed  LIPASE, BLOOD - Abnormal; Notable for the following:       Result Value   Lipase 52 (*)    All other components within normal limits  COMPREHENSIVE METABOLIC PANEL - Abnormal; Notable for the following:    Sodium 134 (*)    CO2 18 (*)    Glucose, Bld 122 (*)    BUN 120 (*)    Creatinine, Ser 5.44 (*)    Calcium 8.8 (*)    Total Protein 8.7 (*)    Albumin 3.3 (*)     ALT 65 (*)    Alkaline Phosphatase 183 (*)    GFR calc non Af Amer 10 (*)    GFR calc Af Amer 12 (*)    All other components within normal limits  CBC - Abnormal; Notable for the following:    WBC 14.9 (*)    RDW 15.6 (*)    All other components within normal limits  URINE CULTURE  URINALYSIS, ROUTINE W REFLEX MICROSCOPIC    EKG  EKG Interpretation  None       Radiology Ct Abdomen Pelvis Wo Contrast  Result Date: 02/13/2017 CLINICAL DATA:  Bladder cancer, status post cystectomy and urostomy. Intermittent hematuria with abdominal pain and decreased appetite. Lower abdominal pain. EXAM: CT ABDOMEN AND PELVIS WITHOUT CONTRAST TECHNIQUE: Multidetector CT imaging of the abdomen and pelvis was performed following the standard protocol without IV contrast. COMPARISON:  01/14/2017 FINDINGS: Lower chest: Dependent bibasilar atelectasis. No pericardial or pleural effusion. No focal basilar pneumonia. Normal heart size. Moderate hiatal hernia noted. Hepatobiliary: No focal liver abnormality is seen. No gallstones, gallbladder wall thickening, or biliary dilatation. Pancreas: Unremarkable. No pancreatic ductal dilatation or surrounding inflammatory changes. Spleen: Normal in size without focal abnormality. Adrenals/Urinary Tract: Normal adrenal glands. Patient is status post cystectomy and right abdominal ileal conduit/urostomy. Through the urostomy, there is a left nephroureteral catheter with the retention loop in the left renal pelvis. Compared to the prior study, there is increased left hydroureteronephrosis. Chronic atrophy of the right kidney without obstruction or hydronephrosis. Punctate nonobstructing intrarenal calculi bilaterally. Stomach/Bowel: Negative for bowel obstruction, significant dilatation ileus, or free air. Postop changes of the lower abdomen and pelvis. Appendix not visualized. Retained stool throughout right colon compatible with constipation. Vascular/Lymphatic: Atherosclerosis of  the aorta and iliac vessels. Small scattered nonenlarged retroperitoneal lymph nodes. No adenopathy. Reproductive: Remote cystectomy and prostatectomy. Other: No inguinal or abdominal wall hernia. Musculoskeletal: Degenerative changes of the spine. No acute osseous finding. Bones are osteopenic. IMPRESSION: Remote cystectomy and right lower quadrant ileal conduit/urostomy. Chronic indwelling left nephroureteral catheter via the ostomy. Stable catheter position but there is interval development of left hydroureteronephrosis suggesting tubal occlusion. Bibasilar atelectasis Small hiatal hernia Nonobstructing intrarenal calculi bilaterally Aortoiliac atherosclerosis without aneurysm Electronically Signed   By: Jerilynn Mages.  Shick M.D.   On: 02/13/2017 10:36    Procedures Procedures (including critical care time)  Medications Ordered in ED Medications  iopamidol (ISOVUE-300) 61 % injection (not administered)  piperacillin-tazobactam (ZOSYN) IVPB 3.375 g (3.375 g Intravenous New Bag/Given 02/13/17 1151)  sodium chloride 0.9 % bolus 1,000 mL (0 mLs Intravenous Stopped 02/13/17 0950)  morphine 2 MG/ML injection 4 mg (4 mg Intravenous Given 02/13/17 0841)  ondansetron (ZOFRAN) injection 4 mg (4 mg Intravenous Given 02/13/17 0842)  iopamidol (ISOVUE-300) 61 % injection 15 mL (15 mLs Oral Contrast Given 02/13/17 0833)  sodium chloride 0.9 % bolus 1,000 mL (1,000 mLs Intravenous New Bag/Given 02/13/17 1047)  HYDROmorphone (DILAUDID) injection 0.5 mg (0.5 mg Intravenous Given 02/13/17 0915)     Initial Impression / Assessment and Plan / ED Course  I have reviewed the triage vital signs and the nursing notes.  Pertinent labs & imaging results that were available during my care of the patient were reviewed by me and considered in my medical decision making (see chart for details).     PT w/ urostomy from bladder cancer w/ ongoing lower abdominal pain, recent N/V and no BM x 4-5 days, decreased oral intake. He was nontoxic on  exam with normal vital signs. He had focal abdominal tenderness around his urostomy in the lower abdomen. Cloudy, foul-smelling urine in the bag. His lab work is notable for significantly worsening creatinine at 5.44 with BUN 120, CO2 18, WBC 14.9. Obtained CT for evaluation of his abdominal pain. Gave 2 L of IV fluids as well as Zofran and Dilaudid.  CT shows occluded left nephroureteral catheter with hydronephrosis. I also suspect recurrent infection based on his symptoms and urine appearance, therefore gave Zosyn for broad coverage. I  discussed with urology, Dr. Pilar Jarvis, appreciate assistance. Per his instruction, contacted IR. Discussed with Dr. Pascal Lux, who will mobilize team for replacement of tube today.Discussed admission with hospitalist, Dr. Sheran Fava, and patient admitted for further care. Final Clinical Impressions(s) / ED Diagnoses   Final diagnoses:  Acute renal failure superimposed on chronic kidney disease, unspecified CKD stage, unspecified acute renal failure type ([place])  Complicated UTI (urinary tract infection)    New Prescriptions New Prescriptions   No medications on file     Little, Wenda Overland, MD 02/13/17 1211    Little, Wenda Overland, MD 02/13/17 1225

## 2017-02-13 NOTE — Progress Notes (Signed)
Pharmacy Antibiotic Note  Darren Carr is a 66 y.o. male admitted on 02/13/2017 with UTI.  He has hx bladder CA and is s/p cystoprostatectomy, lymphadenectomy and ileal conduit urinary diversion on 01/16/15.   He was noted to have obstructed left nephroureteral tube which was exchanged in IR today.  Pharmacy has been consulted for Zosyn dosing.    AKI noted, estimated CrCl<62ml/min.    Plan: Zosyn 2.25gm IV q8h Monitor renal function and cx data   Height: 5\' 10"  (177.8 cm) Weight: 157 lb (71.2 kg) IBW/kg (Calculated) : 73  Temp (24hrs), Avg:98.1 F (36.7 C), Min:97.4 F (36.3 C), Max:98.4 F (36.9 C)   Recent Labs Lab 02/13/17 0753  WBC 14.9*  CREATININE 5.44*    Estimated Creatinine Clearance: 13.8 mL/min (A) (by C-G formula based on SCr of 5.44 mg/dL (H)).    No Known Allergies  Antimicrobials this admission: 6/2 Zosyn >>   Dose adjustments this admission:  Microbiology results: 6/2 UCx: sent   Thank you for allowing pharmacy to be a part of this patient's care.  Biagio Borg 02/13/2017 3:25 PM

## 2017-02-13 NOTE — Consult Note (Signed)
02/13/2017 12:54 PM   Darren Carr [birth year] [id number]  Referring provider: Dr. Maryruth Bun  CC: Left hydronephrosis, AKI  HPI: 1 - Metastatic Bladder Cancer - s/p robotic cystoprostatectomy with ICG sentinel + template pelvic lymphadenectomy and ileal conduit urinary diversion on 01/16/15 for [id number] urothelial carcinoma. Not candidate for adjuvant chemo per medical oncology.   Recent Surveillance:  07/2016 - CMP, CXR, CT - no recurrence  10/2016 - Smal Rt distal ureteral tumor ablated endoscopicaly (T1G3) at time of stone surgery, otherwise no recurrence by CXR, CT, CMP  02/2017 - CT with no recurrence however left nephroureteral stent is occluded causing significant left hydronephrosis.  2 - Left Hydronephrosis / Right Renal Atrophy / Stage 3 Renal Insufficiency - had right hydro from bladder cancer obstrucitng Rt UO pre-cystectomy and partial Rt renal atrophy. New Left hydro post-cystectomy and neph tube placed 02/2015 (left Bander stent "fell out" early). Baseline Cr 2-3. Acute increase in Cr to 5.44 as a result of occluded left nephroureteral stent  3 - Ileal Conduit - conduit diversion with bricker ureteral anastamoses at cystectomy 01/2015.   4 - Nephrolithiasis / New RIGHT Hydronephrosis - s/p Right 2 stage PCNL and ureteroscopic tumor ablation to stone free 10/2016 for obstructing stones.   5 - Bacteruria - chronic bacteruria as expected with conduit diversion. Most recent CX's 07/2016 polyclonal sens Cipro. Patient today does however endorse fevers and chills  6 - Occluded Left Nephroureteral stent - seen on CT 02/13/17  Today, the patient presents to the ED with weakness, fatigues, nausea, vomiting, abdominal pain, decreased appetite, and fevers. Left nephroureteral stent was due to be changed in 5 days and now is occluded causing significant acute on chronic renal failure with a Cr of 5.44.        PMH: Past Medical History:  Diagnosis Date  . Acute MI, lateral wall ([place])  02/23/2015  . Alcoholic ([place])   . 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 Bladder cancer ([place]) 07/2014  . Chronic back pain   . Coronary artery disease   . GERD (gastroesophageal reflux disease)   . History of blood transfusion    "related to bleeding ulcers on my esophagus"  . History of kidney stones   . Hypertension   . Kidney stones   . Paroxysmal atrial fibrillation ([facility])   . Upper GI bleed     Surgical History: Past Surgical History:  Procedure Laterality Date  . APPENDECTOMY    . BALLOON DILATION N/A 08/16/2013   Procedure: BALLOON DILATION to 16.72mm;  Surgeon: Rogene Houston, MD;  Location: [facility];  Service: Endoscopy;  Laterality: N/A;  . BIOPSY  08/16/2013   Procedure: DISTAL ESOPHAGEAL BIOPSIES;  Surgeon: Rogene Houston, MD;  Location: [facility];  Service: Endoscopy;;  . CARDIAC CATHETERIZATION N/A 02/23/2015   Procedure: Left Heart Cath and Coronary Angiography;  Surgeon: Charolette Forward, MD;  Location: [facility];  Service: Cardiovascular;  Laterality: N/A;  . CARDIAC CATHETERIZATION N/A 04/25/2015   Procedure: Coronary Stent Intervention;  Surgeon: Charolette Forward, MD;  Location: [facility];  Service: Cardiovascular;  Laterality: N/A;  . CORONARY ANGIOPLASTY    . CYSTOSCOPY W/ URETERAL STENT PLACEMENT Bilateral 12/05/2014   Procedure: CYSTOSCOPY WITH BILATERAL RETROGRADE PYELOGRAM;  Surgeon: [name], MD;  Location: [facility];  Service: Urology;  Laterality: Bilateral;  . CYSTOSCOPY WITH INJECTION N/A 01/16/2015   Procedure: CYSTOSCOPY WITH INJECTION OF INDOCYANINE GREEN DYE;  Surgeon: [name], MD;  Location: [facility];  Service: Urology;  Laterality: N/A;  . ESOPHAGOGASTRODUODENOSCOPY N/A 02/24/2015   Procedure: ESOPHAGOGASTRODUODENOSCOPY (EGD);  Surgeon: Wilford Corner, MD;  Location: [facility];  Service: Endoscopy;  Laterality: N/A;  . ESOPHAGOGASTRODUODENOSCOPY (EGD) WITH PROPOFOL N/A 08/16/2013   Procedure: ESOPHAGOGASTRODUODENOSCOPY  (EGD) WITH PROPOFOL Hiatus at 40cm Gastroesophageal Junction at 37cm;  Surgeon: Rogene Houston, MD;  Location: [facility];  Service: Endoscopy;  Laterality: N/A;  . HOLMIUM LASER APPLICATION Right 10/21/8339   Procedure: HOLMIUM LASER APPLICATION;  Surgeon: [name], MD;  [facility];  Service: Urology;  Laterality: Right;  . IR CATHETER TUBE CHANGE  12/28/2016  . IR CATHETER TUBE CHANGE  01/14/2017  . IR GENERIC HISTORICAL  04/29/2016   IR CATHETER TUBE CHANGE 04/29/2016 [facility]  . IR GENERIC HISTORICAL  06/10/2016   IR CATHETER TUBE CHANGE 06/10/2016 Jacqulynn Cadet, MD [facility]  . IR GENERIC HISTORICAL  07/15/2016   IR CATHETER TUBE CHANGE 07/15/2016 Marybelle Killings, MD [facility]  . IR GENERIC HISTORICAL  08/10/2016   IR NEPHROSTOMY PLACEMENT RIGHT 08/10/2016 Aletta Edouard, MD [facility]  . IR GENERIC HISTORICAL  08/11/2016   IR NEPHROSTOMY TUBE CHANGE 08/11/2016 Arne Cleveland, MD [facility]  . IR GENERIC HISTORICAL  09/08/2016   IR NEPHROSTOMY EXCHANGE RIGHT 09/08/2016 Marybelle Killings, MD [facility]  . IR GENERIC HISTORICAL  09/08/2016   IR CATHETER TUBE CHANGE 09/08/2016 Marybelle Killings, MD [facility]  . IR GENERIC HISTORICAL  10/13/2016   IR CATHETER TUBE CHANGE 10/13/2016 Jacqulynn Cadet, MD [facility]  . IR GENERIC HISTORICAL  11/23/2016   IR CATHETER TUBE CHANGE 11/23/2016 Greggory Keen, MD [facility]  . LYMPHADENECTOMY Bilateral 01/16/2015   Procedure: PELVIC LYMPH NODE DISSECTION;  Surgeon: [name], MD;  [facility];  Service: Urology;  Laterality: Bilateral;  . NEPHROLITHOTOMY Right 10/14/2016   Procedure: 1ST STAGE NEPHROLITHOTOMY PERCUTANEOUS ureteroscopy with stone basket and ureter biopsy right;  Surgeon: [name], MD;  [facility];  Service: Urology;  Laterality: Right;  . NEPHROLITHOTOMY Right 10/16/2016   Procedure: NEPHROLITHOTOMY PERCUTANEOUS SECOND STAGE , ANTEGRADE NEPHROSTOGRAM;  Surgeon: [name], MD;  [facility];   Service: Urology;  Laterality: Right;  . ROBOT ASSISTED LAPAROSCOPIC COMPLETE CYSTECT ILEAL CONDUIT N/A 01/16/2015   Procedure: ROBOTIC ASSISTED LAPAROSCOPIC COMPLETE CYSTECT ILEAL CONDUIT/ROBOTIC ASSISTED LAPAROSCOPIC RADICAL PROSTATECTOMY;  Surgeon: [name], MD;  [facility];  Service: Urology;  Laterality: N/A;  . TRANSURETHRAL RESECTION OF BLADDER TUMOR WITH GYRUS (TURBT-GYRUS) N/A 12/05/2014   Procedure: TRANSURETHRAL RESECTION OF BLADDER TUMOR WITH GYRUS (TURBT-GYRUS);  Surgeon: [name], MD;  [facility];  Service: Urology;  Laterality: N/A;  . TRANSURETHRAL RESECTION OF PROSTATE  07-2014,08-2014,09-2014     Allergies: No Known Allergies  Family History: Family History  Problem Relation Age of Onset  . Lung cancer Mother   . Heart attack Father        MI at 20 and died  . Rheumatic fever Father   . Kidney cancer Sister     Social History:  reports that he has been smoking Cigarettes.  He has a 73.50 pack-year smoking history. He has never used smokeless tobacco. He reports that he does not drink alcohol or use drugs.  ROS: 12 point ROS negative except for above  Physical Exam: BP 134/89   Pulse 74   Temp 98.2 F (36.8 C) (Oral)   Resp 16   Ht 5\' 10"  (1.778 m)   Wt 157 lb (71.2 kg)  SpO2 95%   BMI 22.53 kg/m   Constitutional:  Alert and oriented, No acute distress. HEENT: [place] AT, moist mucus membranes.  Trachea midline, no masses. Cardiovascular: No clubbing, cyanosis, or edema. Respiratory: Normal respiratory effort, no increased work of breathing. GI: Abdomen is soft, nontender, nondistended, no abdominal masses GU: Left CVA tenderness. Urostomy p/p/p. Left nephroureteral stent protruding from ostomy Skin: No rashes, bruises or suspicious lesions. Lymph: No cervical or inguinal adenopathy. Neurologic: Grossly intact, no focal deficits, moving all 4 extremities. Psychiatric: Normal mood and affect.  Laboratory Data: Lab Results  Component  Value Date   WBC 14.9 (H) 02/13/2017   HGB 13.7 02/13/2017   HCT 39.5 02/13/2017   MCV 84.0 02/13/2017   PLT 354 02/13/2017    Lab Results  Component Value Date   CREATININE 5.44 (H) 02/13/2017    No results found for: PSA  No results found for: TESTOSTERONE  No results found for: HGBA1C  Urinalysis    Component Value Date/Time   COLORURINE AMBER (A) 02/13/2017 1144   APPEARANCEUR CLOUDY (A) 02/13/2017 1144   LABSPEC 1.010 02/13/2017 1144   PHURINE 7.0 02/13/2017 1144   GLUCOSEU 50 (A) 02/13/2017 1144   HGBUR MODERATE (A) 02/13/2017 1144   BILIRUBINUR NEGATIVE 02/13/2017 1144   KETONESUR NEGATIVE 02/13/2017 1144   PROTEINUR 100 (A) 02/13/2017 1144   UROBILINOGEN 0.2 02/23/2015 2220   NITRITE NEGATIVE 02/13/2017 1144   LEUKOCYTESUR LARGE (A) 02/13/2017 1144    Pertinent Imaging: CLINICAL DATA:  Bladder cancer, status post cystectomy and urostomy. Intermittent hematuria with abdominal pain and decreased appetite. Lower abdominal pain.  EXAM: CT ABDOMEN AND PELVIS WITHOUT CONTRAST  TECHNIQUE: Multidetector CT imaging of the abdomen and pelvis was performed following the standard protocol without IV contrast.  COMPARISON:  01/14/2017  FINDINGS: Lower chest: Dependent bibasilar atelectasis. No pericardial or pleural effusion. No focal basilar pneumonia. Normal heart size. Moderate hiatal hernia noted.  Hepatobiliary: No focal liver abnormality is seen. No gallstones, gallbladder wall thickening, or biliary dilatation.  Pancreas: Unremarkable. No pancreatic ductal dilatation or surrounding inflammatory changes.  Spleen: Normal in size without focal abnormality.  Adrenals/Urinary Tract: Normal adrenal glands.  Patient is status post cystectomy and right abdominal ileal conduit/urostomy. Through the urostomy, there is a left nephroureteral catheter with the retention loop in the left renal pelvis. Compared to the prior study, there is increased  left hydroureteronephrosis.  Chronic atrophy of the right kidney without obstruction or hydronephrosis. Punctate nonobstructing intrarenal calculi bilaterally.  Stomach/Bowel: Negative for bowel obstruction, significant dilatation ileus, or free air. Postop changes of the lower abdomen and pelvis. Appendix not visualized. Retained stool throughout right colon compatible with constipation.  Vascular/Lymphatic: Atherosclerosis of the aorta and iliac vessels. Small scattered nonenlarged retroperitoneal lymph nodes. No adenopathy.  Reproductive: Remote cystectomy and prostatectomy.  Other: No inguinal or abdominal wall hernia.  Musculoskeletal: Degenerative changes of the spine. No acute osseous finding. Bones are osteopenic.  IMPRESSION: Remote cystectomy and right lower quadrant ileal conduit/urostomy. Chronic indwelling left nephroureteral catheter via the ostomy. Stable catheter position but there is interval development of left hydroureteronephrosis suggesting tubal occlusion.  Bibasilar atelectasis  Small hiatal hernia  Nonobstructing intrarenal calculi bilaterally  Aortoiliac atherosclerosis without aneurysm  Assessment & Plan:    1. Occluded left nephroureteral stent causing AKI -IR plans to exchange left nephroureteral stent today -Monitor Cr and UOP post operatively. Patient is at risk for postobstructive diuresis  2. History of Bladder Cancer -No evidence of lymphadenopathy on CT today  3. History of nephrolithiasis -No significant stone burden on CT today  4. Possible UTI -recommend obtaining urine culture via left nephroureteral catheter. Cultures obtained from urostomy bag are considered contaminated and are unreliable.    Nickie Retort, MD

## 2017-02-13 NOTE — ED Notes (Signed)
Bed: LW85 Expected date:  Expected time:  Means of arrival:  Comments: Hold for I.R. Pt.

## 2017-02-14 DIAGNOSIS — K219 Gastro-esophageal reflux disease without esophagitis: Secondary | ICD-10-CM

## 2017-02-14 DIAGNOSIS — N189 Chronic kidney disease, unspecified: Secondary | ICD-10-CM

## 2017-02-14 LAB — CBC
HEMATOCRIT: 31.2 % — AB (ref 39.0–52.0)
HEMOGLOBIN: 10.3 g/dL — AB (ref 13.0–17.0)
MCH: 27.8 pg (ref 26.0–34.0)
MCHC: 33 g/dL (ref 30.0–36.0)
MCV: 84.3 fL (ref 78.0–100.0)
Platelets: 295 10*3/uL (ref 150–400)
RBC: 3.7 MIL/uL — ABNORMAL LOW (ref 4.22–5.81)
RDW: 15.7 % — ABNORMAL HIGH (ref 11.5–15.5)
WBC: 9.5 10*3/uL (ref 4.0–10.5)

## 2017-02-14 LAB — HIV ANTIBODY (ROUTINE TESTING W REFLEX): HIV Screen 4th Generation wRfx: NONREACTIVE

## 2017-02-14 LAB — BASIC METABOLIC PANEL
ANION GAP: 9 (ref 5–15)
BUN: 97 mg/dL — AB (ref 6–20)
CHLORIDE: 110 mmol/L (ref 101–111)
CO2: 18 mmol/L — AB (ref 22–32)
Calcium: 7.9 mg/dL — ABNORMAL LOW (ref 8.9–10.3)
Creatinine, Ser: 4.27 mg/dL — ABNORMAL HIGH (ref 0.61–1.24)
GFR calc Af Amer: 16 mL/min — ABNORMAL LOW (ref >60)
GFR calc non Af Amer: 13 mL/min — ABNORMAL LOW (ref >60)
Glucose, Bld: 104 mg/dL — ABNORMAL HIGH (ref 65–99)
POTASSIUM: 4.2 mmol/L (ref 3.5–5.1)
Sodium: 137 mmol/L (ref 135–145)

## 2017-02-14 LAB — URINE CULTURE: SPECIAL REQUESTS: NORMAL

## 2017-02-14 NOTE — Progress Notes (Signed)
No events overnight Patient reports feeling better Left flank pain resolved Diet at baseline No n/v/f/c  Cr improved to 4.27 from 5.44 with 2 L UOP since nephroureteral stent exchange  Vitals:   02/13/17 1510 02/13/17 2150 02/14/17 0435 02/14/17 0839  BP: 112/83 93/62 97/63  (!) 102/52  Pulse: 67 65 60 60  Resp: 17 16 16    Temp: 98.3 F (36.8 C) 97.7 F (36.5 C) 98.2 F (36.8 C)   TempSrc: Oral Oral Oral   SpO2: 98% 98% 100%   Weight: 153 lb 7 oz (69.6 kg)     Height: 5\' 10"  (1.778 m)      I/O last 3 completed shifts: In: 2778 [P.O.:240; I.V.:1390; IV Piggyback:100] Out: 1300 [Urine:1300] Total I/O In: 360 [P.O.:360] Out: 700 [Urine:700]  NAD Soft NT ND Urostomy p/p/p w/ left nephroureteral catheter in place  CBC    Component Value Date/Time   WBC 9.5 02/14/2017 0518   RBC 3.70 (L) 02/14/2017 0518   HGB 10.3 (L) 02/14/2017 0518   HCT 31.2 (L) 02/14/2017 0518   PLT 295 02/14/2017 0518   MCV 84.3 02/14/2017 0518   MCH 27.8 02/14/2017 0518   MCHC 33.0 02/14/2017 0518   RDW 15.7 (H) 02/14/2017 0518   LYMPHSABS 2.4 01/14/2017 1021   MONOABS 1.1 (H) 01/14/2017 1021   EOSABS 0.4 01/14/2017 1021   BASOSABS 0.1 01/14/2017 1021   BMP Latest Ref Rng & Units 02/14/2017 02/13/2017 01/14/2017  Glucose 65 - 99 mg/dL 104(H) 122(H) 95  BUN 6 - 20 mg/dL 97(H) 120(H) 40(H)  Creatinine 0.61 - 1.24 mg/dL 4.27(H) 5.44(H) 2.81(H)  Sodium 135 - 145 mmol/L 137 134(L) 133(L)  Potassium 3.5 - 5.1 mmol/L 4.2 4.6 4.5  Chloride 101 - 111 mmol/L 110 105 105  CO2 22 - 32 mmol/L 18(L) 18(L) 19(L)  Calcium 8.9 - 10.3 mg/dL 7.9(L) 8.8(L) 8.3(L)  ;  Urine culture from left nephroureteral stent after exchange pending  A/P: POD 1 s/p exchange of left nephroureteral stent  1. Occluded left nephroureteral stent causing AKI -AKI improving after exchange -Monitor Cr and UOP post operatively. Patient is at risk for postobstructive diuresis -Patient strongly encouraged to drink to thirst  2.  Possible UTI -cultures obtained from new left nephroureteral catheter pending. Continue abx pending results  3. History of Bladder Cancer -No evidence of lymphadenopathy on CT 02/13/17  4. History of nephrolithiasis -No significant stone burden on CT 02/13/17

## 2017-02-14 NOTE — Progress Notes (Signed)
PROGRESS NOTE    Darren Carr  XBD:[id number] DOB: 27-Apr-1951 DOA: 02/13/2017 PCP: [name], DO   Brief Narrative: Darren Carr is a 66 y.o. male with history of coronary artery disease, hypertension, paroxysmal atrial fibrillation, and metastatic bladder cancer status post robotic cystoprostatectomy with lymphadenectomy and ileal conduit urinary diversion in June 2016, left hydronephrosis with chronic left nephroureteral catheter that drains into his urostomy (last exchange on 5/3), and kidney stones. He presented with abdominal pain, nausea and vomiting, found to have acute renal failure secondary to hydronephrosis secondary to clogged nephroureteral catheter. Exchanged performed 6/2 and AKI improving.   Assessment & Plan:   Principal Problem:   Acute renal failure superimposed on stage 3 chronic kidney disease (HCC) Active Problems:   Bladder cancer (HCC)   Hydronephrosis of left kidney   UTI (urinary tract infection) due to urinary indwelling catheter (HCC)   GERD (gastroesophageal reflux disease)   Constipation   Occluded left nephroureteral stent AKI Stent exchanged. Improvement in symptoms. Urine output good and creatinine improving.  Concern for UTI Urine culture pending -continue Zosyn   DVT prophylaxis: SCDs Code Status: Full code Family Communication: None at bedside Disposition Plan: Discharge in 24-48 hours pending improvement of kidney function   Consultants:   Urology  Procedures:   nephroureteral stent exchange  Antimicrobials:  Zosyn    Subjective: Patient reports improvement of pressure. Afebrile.  Objective: Vitals:   02/13/17 1510 02/13/17 2150 02/14/17 0435 02/14/17 0839  BP: 112/83 93/62 97/63  (!) 102/52  Pulse: 67 65 60 60  Resp: 17 16 16    Temp: 98.3 F (36.8 C) 97.7 F (36.5 C) 98.2 F (36.8 C)   TempSrc: Oral Oral Oral   SpO2: 98% 98% 100%   Weight: 69.6 kg (153 lb 7 oz)     Height: 5\' 10"  (1.778 m)        Intake/Output Summary (Last 24 hours) at 02/14/17 1259 Last data filed at 02/14/17 0938  Gross per 24 hour  Intake             2090 ml  Output             2000 ml  Net               90 ml   Filed Weights   02/13/17 0734 02/13/17 1510  Weight: 71.2 kg (157 lb) 69.6 kg (153 lb 7 oz)    Examination:  General exam: Appears calm and comfortable Respiratory system: Clear to auscultation. Respiratory effort normal. Cardiovascular system: S1 & S2 heard, RRR. No murmurs, rubs, gallops or clicks. Gastrointestinal system: Abdomen is nondistended, soft and nontender. Normal bowel sounds heard. Urostomy present Central nervous system: Alert and oriented. No focal neurological deficits. Extremities: No edema. No calf tenderness Skin: No cyanosis. No rashes Psychiatry: Judgement and insight appear normal. Mood & affect appropriate.     Data Reviewed: I have personally reviewed following labs and imaging studies  CBC:  Recent Labs Lab 02/13/17 0753 02/14/17 0518  WBC 14.9* 9.5  HGB 13.7 10.3*  HCT 39.5 31.2*  MCV 84.0 84.3  PLT 354 834   Basic Metabolic Panel:  Recent Labs Lab 02/13/17 0753 02/14/17 0518  NA 134* 137  K 4.6 4.2  CL 105 110  CO2 18* 18*  GLUCOSE 122* 104*  BUN 120* 97*  CREATININE 5.44* 4.27*  CALCIUM 8.8* 7.9*   GFR: Estimated Creatinine Clearance: 17.2 mL/min (A) (by C-G formula based on SCr of 4.27 mg/dL (H)).  Liver Function Tests:  Recent Labs Lab 02/13/17 0753  AST 35  ALT 65*  ALKPHOS 183*  BILITOT 0.5  PROT 8.7*  ALBUMIN 3.3*    Recent Labs Lab 02/13/17 0753  LIPASE 52*   No results for input(s): AMMONIA in the last 168 hours. Coagulation Profile: No results for input(s): INR, PROTIME in the last 168 hours. Cardiac Enzymes: No results for input(s): CKTOTAL, CKMB, CKMBINDEX, TROPONINI in the last 168 hours. BNP (last 3 results) No results for input(s): PROBNP in the last 8760 hours. HbA1C: No results for input(s): HGBA1C in  the last 72 hours. CBG: No results for input(s): GLUCAP in the last 168 hours. Lipid Profile: No results for input(s): CHOL, HDL, LDLCALC, TRIG, CHOLHDL, LDLDIRECT in the last 72 hours. Thyroid Function Tests: No results for input(s): TSH, T4TOTAL, FREET4, T3FREE, THYROIDAB in the last 72 hours. Anemia Panel: No results for input(s): VITAMINB12, FOLATE, FERRITIN, TIBC, IRON, RETICCTPCT in the last 72 hours. Sepsis Labs: No results for input(s): PROCALCITON, LATICACIDVEN in the last 168 hours.  Recent Results (from the past 240 hour(s))  Urine culture     Status: Abnormal   Collection Time: 02/13/17 11:44 AM  Result Value Ref Range Status   Specimen Description URINE, CATHETERIZED  Final   Special Requests Normal  Final   Culture MULTIPLE SPECIES PRESENT, SUGGEST RECOLLECTION (A)  Final   Report Status 02/14/2017 FINAL  Final         Radiology Studies: Ct Abdomen Pelvis Wo Contrast  Result Date: 02/13/2017 CLINICAL DATA:  Bladder cancer, status post cystectomy and urostomy. Intermittent hematuria with abdominal pain and decreased appetite. Lower abdominal pain. EXAM: CT ABDOMEN AND PELVIS WITHOUT CONTRAST TECHNIQUE: Multidetector CT imaging of the abdomen and pelvis was performed following the standard protocol without IV contrast. COMPARISON:  01/14/2017 FINDINGS: Lower chest: Dependent bibasilar atelectasis. No pericardial or pleural effusion. No focal basilar pneumonia. Normal heart size. Moderate hiatal hernia noted. Hepatobiliary: No focal liver abnormality is seen. No gallstones, gallbladder wall thickening, or biliary dilatation. Pancreas: Unremarkable. No pancreatic ductal dilatation or surrounding inflammatory changes. Spleen: Normal in size without focal abnormality. Adrenals/Urinary Tract: Normal adrenal glands. Patient is status post cystectomy and right abdominal ileal conduit/urostomy. Through the urostomy, there is a left nephroureteral catheter with the retention loop in  the left renal pelvis. Compared to the prior study, there is increased left hydroureteronephrosis. Chronic atrophy of the right kidney without obstruction or hydronephrosis. Punctate nonobstructing intrarenal calculi bilaterally. Stomach/Bowel: Negative for bowel obstruction, significant dilatation ileus, or free air. Postop changes of the lower abdomen and pelvis. Appendix not visualized. Retained stool throughout right colon compatible with constipation. Vascular/Lymphatic: Atherosclerosis of the aorta and iliac vessels. Small scattered nonenlarged retroperitoneal lymph nodes. No adenopathy. Reproductive: Remote cystectomy and prostatectomy. Other: No inguinal or abdominal wall hernia. Musculoskeletal: Degenerative changes of the spine. No acute osseous finding. Bones are osteopenic. IMPRESSION: Remote cystectomy and right lower quadrant ileal conduit/urostomy. Chronic indwelling left nephroureteral catheter via the ostomy. Stable catheter position but there is interval development of left hydroureteronephrosis suggesting tubal occlusion. Bibasilar atelectasis Small hiatal hernia Nonobstructing intrarenal calculi bilaterally Aortoiliac atherosclerosis without aneurysm Electronically Signed   By: Jerilynn Mages.  Shick M.D.   On: 02/13/2017 10:36   Ir Nephrostomy Exchange Left  Result Date: 02/13/2017 INDICATION: Urosepsis, occluded left nephroureteral catheter via urostomy EXAM: FLUOROSCOPIC EXCHANGE OF THE LEFT NEPHROURETERAL CATHETER COMPARISON:  02/13/2017 MEDICATIONS: None. ANESTHESIA/SEDATION: None. CONTRAST:  This cc Isovue - administered into the collecting system(s) FLUOROSCOPY TIME:  Fluoroscopy Time: 1 minutes 6 seconds (14 mGy). COMPLICATIONS: None immediate. PROCEDURE: Informed written consent was obtained from the patient after a thorough discussion of the procedural risks, benefits and alternatives. All questions were addressed. Maximal Sterile Barrier Technique was utilized including caps, mask, sterile  gowns, sterile gloves, sterile drape, hand hygiene and skin antiseptic. A timeout was performed prior to the initiation of the procedure. Under sterile conditions, the existing 12 French 45 cm nephroureteral catheter was cut and removed over a Bentson guidewire. A new 12 French catheter advanced over Bentson guidewire with the retention loop formed the renal pelvis. Contrast injection confirms position. Images obtained for documentation. Catheter secured within the urostomy bag. IMPRESSION: Successful fluoroscopic exchange of the left nephroureteral catheter via the existing urostomy. Electronically Signed   By: Jerilynn Mages.  Shick M.D.   On: 02/13/2017 13:59        Scheduled Meds: . aspirin EC  81 mg Oral Daily  . atorvastatin  40 mg Oral q1800  . bisacodyl  10 mg Rectal Once  . carvedilol  3.125 mg Oral BID WC  . feeding supplement (ENSURE ENLIVE)  237 mL Oral BID BM  . pantoprazole  40 mg Oral BID  . polyethylene glycol  17 g Oral BID   Continuous Infusions: . dextrose 5 % and 0.45% NaCl 100 mL/hr at 02/14/17 1215  . piperacillin-tazobactam (ZOSYN)  IV 2.25 g (02/14/17 1213)     LOS: 1 day     Cordelia Poche, MD [facility] 02/14/2017, 12:59 PM Pager: [phone]  If 7PM-7AM, please contact night-coverage [email or web address] Password [email or web address] 02/14/2017, 12:59 PM

## 2017-02-15 LAB — BASIC METABOLIC PANEL
Anion gap: 8 (ref 5–15)
BUN: 77 mg/dL — ABNORMAL HIGH (ref 6–20)
CHLORIDE: 108 mmol/L (ref 101–111)
CO2: 21 mmol/L — AB (ref 22–32)
CREATININE: 3.74 mg/dL — AB (ref 0.61–1.24)
Calcium: 7.9 mg/dL — ABNORMAL LOW (ref 8.9–10.3)
GFR calc Af Amer: 18 mL/min — ABNORMAL LOW (ref >60)
GFR calc non Af Amer: 16 mL/min — ABNORMAL LOW (ref >60)
GLUCOSE: 101 mg/dL — AB (ref 65–99)
Potassium: 4.1 mmol/L (ref 3.5–5.1)
Sodium: 137 mmol/L (ref 135–145)

## 2017-02-15 NOTE — Progress Notes (Signed)
Subjective/Chief Complaint:  1 - Metastatic Bladder Cancer - s/p robotic cystoprostatectomy with ICG sentinel + template pelvic lymphadenectomy and ileal conduit urinary diversion on 01/16/15 for pT3aN1Mx urothelial carcinoma. Not candidate for adjuvant chemo per medical oncology.   Recent Surveillance:  07/2016 - CMP, CXR, CT - no recurrence  10/2016 - Smal Rt distal ureteral tumor ablated endoscopicaly (T1G3) at time of stone surgery, otherwise no recurrence by CXR, CT, CMP   2 - Left Hydronephrosis / Right Renal Atrophy / Stage 3 Renal Insufficiency - had right hydro from bladder cancer obstrucitng Rt UO pre-cystectomy and partial Rt renal atrophy. New Left hydro post-cystectomy and neph tube placed 02/2015 (left Bander stent "fell out" early). Most recent Cr 2-3 and managing with externalized nephro-ureteral stent 29F.   3 - Ileal Conduit - conduit diversion with bricker ureteral anastamoses at cystectomy 01/2015.   4 - Nephrolithiasis / New RIGHT Hydronephrosis - s/p Right 2 stage PCNL and ureteroscopic tumor ablation to stone free 10/2016 for obstructing stones.   5 - Clogged Left Neprhoureteral Stent - ARF and new left hydro by ER CT this admission prompting IR replacement of nephroureteral stent on 6/2.   Today "Ulice Dash" is stable. Cr continues to improve and CT this admission amazingly remains w/o measurable cancer.     Objective: Vital signs in last 24 hours: Temp:  [97.9 F (36.6 C)-98.1 F (36.7 C)] 97.9 F (36.6 C) (06/04 0505) Pulse Rate:  [61-66] 64 (06/04 0505) Resp:  [16] 16 (06/04 0505) BP: (90-104)/(60-71) 104/71 (06/04 0505) SpO2:  [98 %-100 %] 100 % (06/04 0505) Last BM Date: 02/09/17  Intake/Output from previous day: 06/03 0701 - 06/04 0700 In: 3410 [P.O.:860; I.V.:2400; IV Piggyback:150] Out: 3550 [Urine:3550] Intake/Output this shift: Total I/O In: 360 [P.O.:360] Out: -   General appearance: alert, cooperative, appears stated age and wife at bedside Eyes:  negative Nose: Nares normal. Septum midline. Mucosa normal. No drainage or sinus tenderness. Throat: lips, mucosa, and tongue normal; teeth and gums normal Neck: supple, symmetrical, trachea midline Back: symmetric, no curvature. ROM normal. No CVA tenderness. Resp: non-labored on room air.  Cardio: Nl rate GI: soft, non-tender; bowel sounds normal; no masses,  no organomegaly Extremities: extremities normal, atraumatic, no cyanosis or edema Pulses: 2+ and symmetric Skin: Skin color, texture, turgor normal. No rashes or lesions Lymph nodes: Cervical, supraclavicular, and axillary nodes normal. Neurologic: Grossly normal  Lab Results:   Recent Labs  02/13/17 0753 02/14/17 0518  WBC 14.9* 9.5  HGB 13.7 10.3*  HCT 39.5 31.2*  PLT 354 295   BMET  Recent Labs  02/14/17 0518 02/15/17 0514  NA 137 137  K 4.2 4.1  CL 110 108  CO2 18* 21*  GLUCOSE 104* 101*  BUN 97* 77*  CREATININE 4.27* 3.74*  CALCIUM 7.9* 7.9*   PT/INR No results for input(s): LABPROT, INR in the last 72 hours. ABG No results for input(s): PHART, HCO3 in the last 72 hours.  Invalid input(s): PCO2, PO2  Studies/Results: Ir Nephrostomy Exchange Left  Result Date: 02/13/2017 INDICATION: Urosepsis, occluded left nephroureteral catheter via urostomy EXAM: FLUOROSCOPIC EXCHANGE OF THE LEFT NEPHROURETERAL CATHETER COMPARISON:  02/13/2017 MEDICATIONS: None. ANESTHESIA/SEDATION: None. CONTRAST:  This cc Isovue - administered into the collecting system(s) FLUOROSCOPY TIME:  Fluoroscopy Time: 1 minutes 6 seconds (14 mGy). COMPLICATIONS: None immediate. PROCEDURE: Informed written consent was obtained from the patient after a thorough discussion of the procedural risks, benefits and alternatives. All questions were addressed. Maximal Sterile Barrier Technique was  utilized including caps, mask, sterile gowns, sterile gloves, sterile drape, hand hygiene and skin antiseptic. A timeout was performed prior to the initiation  of the procedure. Under sterile conditions, the existing 12 French 45 cm nephroureteral catheter was cut and removed over a Bentson guidewire. A new 12 French catheter advanced over Bentson guidewire with the retention loop formed the renal pelvis. Contrast injection confirms position. Images obtained for documentation. Catheter secured within the urostomy bag. IMPRESSION: Successful fluoroscopic exchange of the left nephroureteral catheter via the existing urostomy. Electronically Signed   By: Jerilynn Mages.  Shick M.D.   On: 02/13/2017 13:59    Anti-infectives: Anti-infectives    Start     Dose/Rate Route Frequency Ordered Stop   02/13/17 2000  piperacillin-tazobactam (ZOSYN) IVPB 2.25 g     2.25 g 100 mL/hr over 30 Minutes Intravenous Every 8 hours 02/13/17 1532     02/13/17 1100  piperacillin-tazobactam (ZOSYN) IVPB 3.375 g     3.375 g 100 mL/hr over 30 Minutes Intravenous  Once 02/13/17 1059 02/13/17 1348      Assessment/Plan:  Pt improving subjectively and objectively after IR exchange of clogged tube in dominant kidney. I feel Pt likely OF for DC tomorrow. He has f/u with [place] already arranged on 6/18. May discussed left ureteral revision in more detail at that time as he has been w/o measurable metastatic disesae for nearly 2 years now.   [facility], Miyeko Mahlum 02/15/2017

## 2017-02-15 NOTE — Progress Notes (Signed)
PROGRESS NOTE    Darren Carr  ZOX:[id number] DOB: 03-24-1951 DOA: 02/13/2017 PCP: [name], DO   Brief Narrative: Darren Carr is a 66 y.o. male with history of coronary artery disease, hypertension, paroxysmal atrial fibrillation, and metastatic bladder cancer status post robotic cystoprostatectomy with lymphadenectomy and ileal conduit urinary diversion in June 2016, left hydronephrosis with chronic left nephroureteral catheter that drains into his urostomy (last exchange on 5/3), and kidney stones. He presented with abdominal pain, nausea and vomiting, found to have acute renal failure secondary to hydronephrosis secondary to clogged nephroureteral catheter. Exchanged performed 6/2 and AKI improving.   Assessment & Plan:   Principal Problem:   Acute renal failure superimposed on stage 3 chronic kidney disease (HCC) Active Problems:   Bladder cancer (HCC)   Hydronephrosis of left kidney   UTI (urinary tract infection) due to urinary indwelling catheter (HCC)   GERD (gastroesophageal reflux disease)   Constipation   Occluded left nephroureteral stent AKI Stent exchanged. Improvement in symptoms. Significant diuresis. Creatinine slowly improving. Gross hematuria present in bag. Baseline creatinine 2.7-2.8 -repeat BMP in AM  Concern for UTI Urine culture significant for multiple species -urology recommendations with regard for need for antibiotics   DVT prophylaxis: SCDs Code Status: Full code Family Communication: None at bedside Disposition Plan: Discharge in likely 2-3 days pending improvement of kidney function (occuring slowly)   Consultants:   Urology  Procedures:   nephroureteral stent exchange  Antimicrobials:  Zosyn    Subjective: Pressure improved but still present.  Objective: Vitals:   02/14/17 0839 02/14/17 1503 02/14/17 2148 02/15/17 0505  BP: (!) 102/52 102/60 90/64 104/71  Pulse: 60 61 66 64  Resp:  16 16 16   Temp:  98.1 F (36.7  C) 97.9 F (36.6 C) 97.9 F (36.6 C)  TempSrc:  Oral Oral Oral  SpO2:  98% 98% 100%  Weight:      Height:        Intake/Output Summary (Last 24 hours) at 02/15/17 1146 Last data filed at 02/15/17 0900  Gross per 24 hour  Intake             3410 ml  Output             2850 ml  Net              560 ml   Filed Weights   02/13/17 0734 02/13/17 1510  Weight: 71.2 kg (157 lb) 69.6 kg (153 lb 7 oz)    Examination:  General exam: Appears calm and comfortable Respiratory system: Clear to auscultation. Respiratory effort normal. Cardiovascular system: S1 & S2 heard, RRR. No murmurs. Gastrointestinal system: Abdomen is nondistended, soft and nontender. Normal bowel sounds heard. Urostomy present with hematuria. Central nervous system: Alert and oriented. No focal neurological deficits. Extremities: No edema. No calf tenderness Skin: No cyanosis. No rashes Psychiatry: Judgement and insight appear normal. Mood & affect appropriate.     Data Reviewed: I have personally reviewed following labs and imaging studies  CBC:  Recent Labs Lab 02/13/17 0753 02/14/17 0518  WBC 14.9* 9.5  HGB 13.7 10.3*  HCT 39.5 31.2*  MCV 84.0 84.3  PLT 354 811   Basic Metabolic Panel:  Recent Labs Lab 02/13/17 0753 02/14/17 0518 02/15/17 0514  NA 134* 137 137  K 4.6 4.2 4.1  CL 105 110 108  CO2 18* 18* 21*  GLUCOSE 122* 104* 101*  BUN 120* 97* 77*  CREATININE 5.44* 4.27* 3.74*  CALCIUM 8.8*  7.9* 7.9*   GFR: Estimated Creatinine Clearance: 19.6 mL/min (A) (by C-G formula based on SCr of 3.74 mg/dL (H)). Liver Function Tests:  Recent Labs Lab 02/13/17 0753  AST 35  ALT 65*  ALKPHOS 183*  BILITOT 0.5  PROT 8.7*  ALBUMIN 3.3*    Recent Labs Lab 02/13/17 0753  LIPASE 52*   No results for input(s): AMMONIA in the last 168 hours. Coagulation Profile: No results for input(s): INR, PROTIME in the last 168 hours. Cardiac Enzymes: No results for input(s): CKTOTAL, CKMB,  CKMBINDEX, TROPONINI in the last 168 hours. BNP (last 3 results) No results for input(s): PROBNP in the last 8760 hours. HbA1C: No results for input(s): HGBA1C in the last 72 hours. CBG: No results for input(s): GLUCAP in the last 168 hours. Lipid Profile: No results for input(s): CHOL, HDL, LDLCALC, TRIG, CHOLHDL, LDLDIRECT in the last 72 hours. Thyroid Function Tests: No results for input(s): TSH, T4TOTAL, FREET4, T3FREE, THYROIDAB in the last 72 hours. Anemia Panel: No results for input(s): VITAMINB12, FOLATE, FERRITIN, TIBC, IRON, RETICCTPCT in the last 72 hours. Sepsis Labs: No results for input(s): PROCALCITON, LATICACIDVEN in the last 168 hours.  Recent Results (from the past 240 hour(s))  Urine culture     Status: Abnormal   Collection Time: 02/13/17 11:44 AM  Result Value Ref Range Status   Specimen Description URINE, CATHETERIZED  Final   Special Requests Normal  Final   Culture MULTIPLE SPECIES PRESENT, SUGGEST RECOLLECTION (A)  Final   Report Status 02/14/2017 FINAL  Final         Radiology Studies: Ir Nephrostomy Exchange Left  Result Date: 02/13/2017 INDICATION: Urosepsis, occluded left nephroureteral catheter via urostomy EXAM: FLUOROSCOPIC EXCHANGE OF THE LEFT NEPHROURETERAL CATHETER COMPARISON:  02/13/2017 MEDICATIONS: None. ANESTHESIA/SEDATION: None. CONTRAST:  This cc Isovue - administered into the collecting system(s) FLUOROSCOPY TIME:  Fluoroscopy Time: 1 minutes 6 seconds (14 mGy). COMPLICATIONS: None immediate. PROCEDURE: Informed written consent was obtained from the patient after a thorough discussion of the procedural risks, benefits and alternatives. All questions were addressed. Maximal Sterile Barrier Technique was utilized including caps, mask, sterile gowns, sterile gloves, sterile drape, hand hygiene and skin antiseptic. A timeout was performed prior to the initiation of the procedure. Under sterile conditions, the existing 12 French 45 cm  nephroureteral catheter was cut and removed over a Bentson guidewire. A new 12 French catheter advanced over Bentson guidewire with the retention loop formed the renal pelvis. Contrast injection confirms position. Images obtained for documentation. Catheter secured within the urostomy bag. IMPRESSION: Successful fluoroscopic exchange of the left nephroureteral catheter via the existing urostomy. Electronically Signed   By: Jerilynn Mages.  Shick M.D.   On: 02/13/2017 13:59        Scheduled Meds: . aspirin EC  81 mg Oral Daily  . atorvastatin  40 mg Oral q1800  . bisacodyl  10 mg Rectal Once  . carvedilol  3.125 mg Oral BID WC  . feeding supplement (ENSURE ENLIVE)  237 mL Oral BID BM  . pantoprazole  40 mg Oral BID  . polyethylene glycol  17 g Oral BID   Continuous Infusions: . dextrose 5 % and 0.45% NaCl 100 mL/hr at 02/14/17 2255  . piperacillin-tazobactam (ZOSYN)  IV Stopped (02/15/17 0449)     LOS: 2 days     Cordelia Poche, MD [facility] 02/15/2017, 11:46 AM Pager: [phone]  If 7PM-7AM, please contact night-coverage [email or web address] Password [email or web address] 02/15/2017, 11:46 AM

## 2017-02-15 NOTE — Progress Notes (Signed)
Initial Nutrition Assessment  DOCUMENTATION CODES:   Severe malnutrition in context of chronic illness  INTERVENTION:   -Continue Ensure Enlive po BID, each supplement provides 350 kcal and 20 grams of protein -Encourage small, frequent meals -Provided examples of affordable protein supplement options for after discharge -RD to continue to monitor  NUTRITION DIAGNOSIS:   Malnutrition (Severe) related to chronic illness, cancer and cancer related treatments as evidenced by percent weight loss, energy intake < or equal to 75% for > or equal to 1 month, moderate depletion of body fat, severe depletion of muscle mass, moderate depletions of muscle mass.  GOAL:   Patient will meet greater than or equal to 90% of their needs  MONITOR:   PO intake, Supplement acceptance, Labs, Weight trends, I & O's  REASON FOR ASSESSMENT:   Malnutrition Screening Tool    ASSESSMENT:   66 y.o. male with history of coronary artery disease, hypertension, paroxysmal atrial fibrillation, and metastatic bladder cancer status post robotic cystoprostatectomy with lymphadenectomy and ileal conduit urinary diversion in June 2016, left hydronephrosis with chronic left nephroureteral catheter that drains into his urostomy (last exchange on 5/3), and kidney stones. He presented with abdominal pain, nausea and vomiting, found to have acute renal failure secondary to hydronephrosis secondary to clogged nephroureteral catheter. Exchanged performed 6/2 and AKI improving.  Patient in room with wife at bedside. Pt reports his appetite has been poor for years since he was diagnosed with bladder cancer. Pt had N/V for a few days PTA. Pt c/o getting full quickly at meal times. He may eat 3-4 bites of something and that would be all. Pt states he is trying to drink the Ensure supplements but thinks they are really thick. Encouraged pt to continue supplements at home and provided examples of more affordable protein options.    Per chart review, pt has lost 13 lb since 5/3 (8% wt loss x 1 month, significant for time frame). Nutrition-Focused physical exam completed. Findings are moderate fat depletion, moderate-severe muscle depletion, and no edema.   Medications: Protonix tablet BID, Miralax BID Lab reviewed: GFR: 16  Diet Order:  Diet Heart Room service appropriate? Yes; Fluid consistency: Thin  Skin:  Reviewed, no issues  Last BM:  5/29  Height:   Ht Readings from Last 1 Encounters:  02/13/17 5\' 10"  (1.778 m)    Weight:   Wt Readings from Last 1 Encounters:  02/13/17 153 lb 7 oz (69.6 kg)    Ideal Body Weight:  75.5 kg  BMI:  Body mass index is 22.02 kg/m.  Estimated Nutritional Needs:   Kcal:  2000-2200  Protein:  90-100g  Fluid:  2L/day  EDUCATION NEEDS:   Education needs addressed  Clayton Bibles, MS, RD, LDN Pager: [phone] After Hours Pager: [phone]

## 2017-02-16 LAB — BASIC METABOLIC PANEL
Anion gap: 7 (ref 5–15)
BUN: 53 mg/dL — ABNORMAL HIGH (ref 6–20)
CHLORIDE: 107 mmol/L (ref 101–111)
CO2: 21 mmol/L — ABNORMAL LOW (ref 22–32)
Calcium: 8.1 mg/dL — ABNORMAL LOW (ref 8.9–10.3)
Creatinine, Ser: 3.4 mg/dL — ABNORMAL HIGH (ref 0.61–1.24)
GFR calc Af Amer: 20 mL/min — ABNORMAL LOW (ref >60)
GFR, EST NON AFRICAN AMERICAN: 18 mL/min — AB (ref >60)
Glucose, Bld: 108 mg/dL — ABNORMAL HIGH (ref 65–99)
POTASSIUM: 3.9 mmol/L (ref 3.5–5.1)
SODIUM: 135 mmol/L (ref 135–145)

## 2017-02-16 NOTE — Progress Notes (Signed)
Subjective/Chief Complaint:  1 - Metastatic Bladder Cancer - s/p robotic cystoprostatectomy with ICG sentinel + template pelvic lymphadenectomy and ileal conduit urinary diversion on 01/16/15 for pT3aN1Mx urothelial carcinoma. Not candidate for adjuvant chemo per medical oncology.   Recent Surveillance:  07/2016 - CMP, CXR, CT - no recurrence  10/2016 - Smal Rt distal ureteral tumor ablated endoscopicaly (T1G3) at time of stone surgery, otherwise no recurrence by CXR, CT, CMP   2 - Left Hydronephrosis / Right Renal Atrophy / Stage 3 Renal Insufficiency - had right hydro from bladder cancer obstrucitng Rt UO pre-cystectomy and partial Rt renal atrophy. New Left hydro post-cystectomy and neph tube placed 02/2015 (left Bander stent "fell out" early). Most recent Cr 2-3 and managing with externalized nephro-ureteral stent 12F.   3 - Ileal Conduit - conduit diversion with bricker ureteral anastamoses at cystectomy 01/2015.   4 - Nephrolithiasis / New RIGHT Hydronephrosis - s/p Right 2 stage PCNL and ureteroscopic tumor ablation to stone free 10/2016 for obstructing stones.   5 - Clogged Left Neprhoureteral Stent - ARF and new left hydro by ER CT this admission prompting IR replacement of nephroureteral stent on 6/2.   Today "Darren Carr" is without complaints. Less malaise. Cr from today still pending but UOP remians good. No flank pain.    Objective: Vital signs in last 24 hours: Temp:  [97.8 F (36.6 C)-98.2 F (36.8 C)] 97.8 F (36.6 C) (06/05 0521) Pulse Rate:  [52-62] 52 (06/05 0521) Resp:  [16-18] 16 (06/05 0521) BP: (108-111)/(65-75) 111/65 (06/05 0521) SpO2:  [93 %-100 %] 100 % (06/05 0521) Last BM Date: 02/15/17  Intake/Output from previous day: 06/04 0701 - 06/05 0700 In: 2010 [P.O.:360; I.V.:1500; IV Piggyback:150] Out: 3350 [Urine:3350] Intake/Output this shift: Total I/O In: 1300 [I.V.:1200; IV Piggyback:100] Out: 1700 [Urine:1700]  General appearance: alert, cooperative and  appears stated age Eyes: negative Nose: Nares normal. Septum midline. Mucosa normal. No drainage or sinus tenderness. Throat: lips, mucosa, and tongue normal; teeth and gums normal Neck: supple, symmetrical, trachea midline Back: symmetric, no curvature. ROM normal. No CVA tenderness. Resp: non-labored on room air Breasts: normal appearance, no masses or tenderness Cardio: Nl rate GI: soft, non-tender; bowel sounds normal; no masses,  no organomegaly and RLQ Urostomy pink / patent with copious yellow urine and distal nehp-Ustent in good position Male genitalia: normal Extremities: extremities normal, atraumatic, no cyanosis or edema Pulses: 2+ and symmetric Skin: Skin color, texture, turgor normal. No rashes or lesions Lymph nodes: Cervical, supraclavicular, and axillary nodes normal. Neurologic: Grossly normal  Lab Results:   Recent Labs  02/13/17 0753 02/14/17 0518  WBC 14.9* 9.5  HGB 13.7 10.3*  HCT 39.5 31.2*  PLT 354 295   BMET  Recent Labs  02/14/17 0518 02/15/17 0514  NA 137 137  K 4.2 4.1  CL 110 108  CO2 18* 21*  GLUCOSE 104* 101*  BUN 97* 77*  CREATININE 4.27* 3.74*  CALCIUM 7.9* 7.9*   PT/INR No results for input(s): LABPROT, INR in the last 72 hours. ABG No results for input(s): PHART, HCO3 in the last 72 hours.  Invalid input(s): PCO2, PO2  Studies/Results: No results found.  Anti-infectives: Anti-infectives    Start     Dose/Rate Route Frequency Ordered Stop   02/13/17 2000  piperacillin-tazobactam (ZOSYN) IVPB 2.25 g     2.25 g 100 mL/hr over 30 Minutes Intravenous Every 8 hours 02/13/17 1532     02/13/17 1100  piperacillin-tazobactam (ZOSYN) IVPB 3.375 g  3.375 g 100 mL/hr over 30 Minutes Intravenous  Once 02/13/17 1059 02/13/17 1348      Assessment/Plan:  Pt improving subjectively and objectively after IR exchange of clogged tube in dominant kidney. I feel Pt likely OK for DC at anypoint.  I do not feel further ABX warranted as  his CX was non-clonal and he is afebrile / no leukocytosis.   He has f/u with [place] already arranged on 6/18. May discussed left ureteral revision in more detail at that time as he has been w/o measurable metastatic disesae for nearly 2 years now.   Please call me directly with questions anytime.   [facility], Jamespaul Secrist 02/16/2017

## 2017-02-16 NOTE — Progress Notes (Signed)
PROGRESS NOTE    Darren Carr  KNL:[id number] DOB: 02-09-1951 DOA: 02/13/2017 PCP: Darren Graves, DO   Brief Narrative: Darren Carr is a 66 y.o. male with history of coronary artery disease, hypertension, paroxysmal atrial fibrillation, and metastatic bladder cancer status post robotic cystoprostatectomy with lymphadenectomy and ileal conduit urinary diversion in June 2016, left hydronephrosis with chronic left nephroureteral catheter that drains into his urostomy (last exchange on 5/3), and kidney stones. He presented with abdominal pain, nausea and vomiting, found to have acute renal failure secondary to hydronephrosis secondary to clogged nephroureteral catheter. Exchanged performed 6/2 and AKI improving.   Assessment & Plan:   Principal Problem:   Acute renal failure superimposed on stage 3 chronic kidney disease (HCC) Active Problems:   Bladder cancer (HCC)   Hydronephrosis of left kidney   UTI (urinary tract infection) due to urinary indwelling catheter (HCC)   GERD (gastroesophageal reflux disease)   Constipation   Occluded left nephroureteral stent AKI Stent exchanged. Improvement in symptoms. Significant diuresis. Creatinine continues to slowly improve. BUN improving as well. Gross hematuria continues to be present in bag although lighter. Baseline creatinine 2.7-2.8 -repeat BMP in AM -Oxycodone 10mg  q4 hours prn for pain (confirmed with [place] on 6/5 as patient stated he was taking 20mg )  Concern for UTI Urine culture significant for multiple species. Afebrile. WBC wnl. -discontinue Zosyn  Severe malnutrition related to chronic illness -dietitian recommendations: protein supplement   DVT prophylaxis: SCDs Code Status: Full code Family Communication: None at bedside Disposition Plan: Discharge in likely 1-2 days pending improvement of kidney function    Consultants:   Urology  Procedures:   nephroureteral stent  exchange  Antimicrobials:  Zosyn    Subjective: Pain/pressure persistent.  Objective: Vitals:   02/15/17 1754 02/15/17 2037 02/16/17 0521 02/16/17 1012  BP:  108/75 111/65   Pulse: 62 (!) 57 (!) 52 64  Resp:  16 16   Temp:  98.2 F (36.8 C) 97.8 F (36.6 C)   TempSrc:  Oral Oral   SpO2:  99% 100%   Weight:      Height:        Intake/Output Summary (Last 24 hours) at 02/16/17 1035 Last data filed at 02/16/17 0600  Gross per 24 hour  Intake             1350 ml  Output             3350 ml  Net            -2000 ml   Filed Weights   02/13/17 0734 02/13/17 1510  Weight: 71.2 kg (157 lb) 69.6 kg (153 lb 7 oz)    Examination:  General exam: Appears calm and comfortable Respiratory system: Clear to auscultation. Respiratory effort normal. Cardiovascular system: S1 & S2 heard, RRR. No murmurs. Gastrointestinal system: Abdomen is nondistended, soft and mildly tender in lower quadrant. Normal bowel sounds heard. Urostomy present with hematuria. Central nervous system: Alert and oriented. No focal neurological deficits. Extremities: No edema. No calf tenderness Skin: No cyanosis. No rashes Psychiatry: Judgement and insight appear normal. Mood & affect appropriate.     Data Reviewed: I have personally reviewed following labs and imaging studies  CBC:  Recent Labs Lab 02/13/17 0753 02/14/17 0518  WBC 14.9* 9.5  HGB 13.7 10.3*  HCT 39.5 31.2*  MCV 84.0 84.3  PLT 354 790   Basic Metabolic Panel:  Recent Labs Lab 02/13/17 0753 02/14/17 0518 02/15/17 0514 02/16/17 2409  NA 134* 137 137 135  K 4.6 4.2 4.1 3.9  CL 105 110 108 107  CO2 18* 18* 21* 21*  GLUCOSE 122* 104* 101* 108*  BUN 120* 97* 77* 53*  CREATININE 5.44* 4.27* 3.74* 3.40*  CALCIUM 8.8* 7.9* 7.9* 8.1*   GFR: Estimated Creatinine Clearance: 21.6 mL/min (A) (by C-G formula based on SCr of 3.4 mg/dL (H)). Liver Function Tests:  Recent Labs Lab 02/13/17 0753  AST 35  ALT 65*  ALKPHOS 183*   BILITOT 0.5  PROT 8.7*  ALBUMIN 3.3*    Recent Labs Lab 02/13/17 0753  LIPASE 52*   No results for input(s): AMMONIA in the last 168 hours. Coagulation Profile: No results for input(s): INR, PROTIME in the last 168 hours. Cardiac Enzymes: No results for input(s): CKTOTAL, CKMB, CKMBINDEX, TROPONINI in the last 168 hours. BNP (last 3 results) No results for input(s): PROBNP in the last 8760 hours. HbA1C: No results for input(s): HGBA1C in the last 72 hours. CBG: No results for input(s): GLUCAP in the last 168 hours. Lipid Profile: No results for input(s): CHOL, HDL, LDLCALC, TRIG, CHOLHDL, LDLDIRECT in the last 72 hours. Thyroid Function Tests: No results for input(s): TSH, T4TOTAL, FREET4, T3FREE, THYROIDAB in the last 72 hours. Anemia Panel: No results for input(s): VITAMINB12, FOLATE, FERRITIN, TIBC, IRON, RETICCTPCT in the last 72 hours. Sepsis Labs: No results for input(s): PROCALCITON, LATICACIDVEN in the last 168 hours.  Recent Results (from the past 240 hour(s))  Urine culture     Status: Abnormal   Collection Time: 02/13/17 11:44 AM  Result Value Ref Range Status   Specimen Description URINE, CATHETERIZED  Final   Special Requests Normal  Final   Culture MULTIPLE SPECIES PRESENT, SUGGEST RECOLLECTION (A)  Final   Report Status 02/14/2017 FINAL  Final         Radiology Studies: No results found.      Scheduled Meds: . aspirin EC  81 mg Oral Daily  . atorvastatin  40 mg Oral q1800  . bisacodyl  10 mg Rectal Once  . carvedilol  3.125 mg Oral BID WC  . feeding supplement (ENSURE ENLIVE)  237 mL Oral BID BM  . pantoprazole  40 mg Oral BID  . polyethylene glycol  17 g Oral BID   Continuous Infusions: . dextrose 5 % and 0.45% NaCl 100 mL/hr at 02/16/17 0144  . piperacillin-tazobactam (ZOSYN)  IV Stopped (02/16/17 0515)     LOS: 3 days     Cordelia Poche, MD [facility] 02/16/2017, 10:35 AM Pager: [phone]  If 7PM-7AM, please  contact night-coverage [email or web address] Password [email or web address] 02/16/2017, 10:35 AM

## 2017-02-17 DIAGNOSIS — N1330 Unspecified hydronephrosis: Secondary | ICD-10-CM

## 2017-02-17 DIAGNOSIS — N183 Chronic kidney disease, stage 3 (moderate): Secondary | ICD-10-CM

## 2017-02-17 DIAGNOSIS — N179 Acute kidney failure, unspecified: Secondary | ICD-10-CM

## 2017-02-17 LAB — BASIC METABOLIC PANEL
ANION GAP: 7 (ref 5–15)
BUN: 43 mg/dL — AB (ref 6–20)
CO2: 21 mmol/L — AB (ref 22–32)
Calcium: 8 mg/dL — ABNORMAL LOW (ref 8.9–10.3)
Chloride: 108 mmol/L (ref 101–111)
Creatinine, Ser: 3.13 mg/dL — ABNORMAL HIGH (ref 0.61–1.24)
GFR calc non Af Amer: 20 mL/min — ABNORMAL LOW (ref >60)
GFR, EST AFRICAN AMERICAN: 23 mL/min — AB (ref >60)
Glucose, Bld: 106 mg/dL — ABNORMAL HIGH (ref 65–99)
POTASSIUM: 4.2 mmol/L (ref 3.5–5.1)
SODIUM: 136 mmol/L (ref 135–145)

## 2017-02-17 MED ORDER — SODIUM CHLORIDE 0.9 % IV SOLN
INTRAVENOUS | Status: DC
  Administered 2017-02-17 – 2017-02-18 (×2): via INTRAVENOUS

## 2017-02-17 MED ORDER — HYDROMORPHONE HCL 1 MG/ML IJ SOLN
1.0000 mg | INTRAMUSCULAR | Status: DC | PRN
  Administered 2017-02-17 – 2017-02-19 (×10): 1 mg via INTRAVENOUS
  Filled 2017-02-17 (×10): qty 1

## 2017-02-17 NOTE — Progress Notes (Signed)
PROGRESS NOTE    Darren Carr  CLE:[id number] DOB: November 07, 1950 DOA: 02/13/2017 PCP: [name], DO   Brief Narrative: Darren Carr is a 66 y.o. male with history of coronary artery disease, hypertension, paroxysmal atrial fibrillation, and metastatic bladder cancer status post robotic cystoprostatectomy with lymphadenectomy and ileal conduit urinary diversion in June 2016, left hydronephrosis with chronic left nephroureteral catheter that drains into his urostomy (last exchange on 5/3), and kidney stones. He presented with abdominal pain, nausea and vomiting, found to have acute renal failure secondary to hydronephrosis secondary to clogged nephroureteral catheter. Exchanged performed 6/2 and AKI improving.   Assessment & Plan:   Principal Problem:   Acute renal failure superimposed on stage 3 chronic kidney disease (HCC) Active Problems:   Bladder cancer (HCC)   Hydronephrosis of left kidney   UTI (urinary tract infection) due to urinary indwelling catheter (HCC)   GERD (gastroesophageal reflux disease)   Constipation   Occluded left nephroureteral stent AKI Stent exchanged.  Creatinine continues to slowly improve. BUN improving as well. Baseline creatinine 2.7-2.8, creatinine is down to 3.14. Continue to monitor creatinine.  -Oxycodone 10mg  q4 hours prn for pain  And IV dilaudid for break through pain.  Concern for UTI Urine culture significant for multiple species. Afebrile. WBC wnl. -discontinued Zosyn - some hematuria but no symptoms.   Severe malnutrition related to chronic illness -dietitian recommendations: protein supplement   DVT prophylaxis: SCDs Code Status: Full code Family Communication: family at bedside Disposition Plan: Discharge in likely 1-2 days pending improvement of kidney function    Consultants:   Urology  Procedures:   nephroureteral stent exchange  Antimicrobials:  Zosyn discontinue don 6/5   Subjective: Pain/pressure  persistent.no nausea or vomiting no abd pain.some back pain.   Objective: Vitals:   02/16/17 2041 02/17/17 0508 02/17/17 0903 02/17/17 1405  BP: 109/66 108/79  102/75  Pulse: 72 (!) 58 64 (!) 56  Resp: 17 18    Temp: 97.9 F (36.6 C) 97.5 F (36.4 C)  98.4 F (36.9 C)  TempSrc: Oral Oral  Oral  SpO2: 99% 100%  100%  Weight:      Height:        Intake/Output Summary (Last 24 hours) at 02/17/17 1547 Last data filed at 02/17/17 1408  Gross per 24 hour  Intake             2100 ml  Output             2200 ml  Net             -100 ml   Filed Weights   02/13/17 0734 02/13/17 1510  Weight: 71.2 kg (157 lb) 69.6 kg (153 lb 7 oz)    Examination:  General exam: Appears in mild distress from back pain.  Respiratory system: Clear to auscultation. Respiratory effort normal. No wheezing or rhonchi.  Cardiovascular system: S1 & S2 heard, RRR. No murmurs.no pedal edema.  Gastrointestinal system: Abdomen is nondistended, soft and mildly tender in lower quadrant. Normal bowel sounds heard. Urostomy present with hematuria.hematuria improved.  Central nervous system: Alert and oriented. No focal neurological deficits. Extremities: No edema. No calf tenderness Skin: No cyanosis. No rashes Psychiatry: Judgement and insight appear normal. Mood & affect appropriate.     Data Reviewed: I have personally reviewed following labs and imaging studies  CBC:  Recent Labs Lab 02/13/17 0753 02/14/17 0518  WBC 14.9* 9.5  HGB 13.7 10.3*  HCT 39.5 31.2*  MCV 84.0  84.3  PLT 354 782   Basic Metabolic Panel:  Recent Labs Lab 02/13/17 0753 02/14/17 0518 02/15/17 0514 02/16/17 0738 02/17/17 0621  NA 134* 137 137 135 136  K 4.6 4.2 4.1 3.9 4.2  CL 105 110 108 107 108  CO2 18* 18* 21* 21* 21*  GLUCOSE 122* 104* 101* 108* 106*  BUN 120* 97* 77* 53* 43*  CREATININE 5.44* 4.27* 3.74* 3.40* 3.13*  CALCIUM 8.8* 7.9* 7.9* 8.1* 8.0*   GFR: Estimated Creatinine Clearance: 23.5 mL/min (A) (by  C-G formula based on SCr of 3.13 mg/dL (H)). Liver Function Tests:  Recent Labs Lab 02/13/17 0753  AST 35  ALT 65*  ALKPHOS 183*  BILITOT 0.5  PROT 8.7*  ALBUMIN 3.3*    Recent Labs Lab 02/13/17 0753  LIPASE 52*   No results for input(s): AMMONIA in the last 168 hours. Coagulation Profile: No results for input(s): INR, PROTIME in the last 168 hours. Cardiac Enzymes: No results for input(s): CKTOTAL, CKMB, CKMBINDEX, TROPONINI in the last 168 hours. BNP (last 3 results) No results for input(s): PROBNP in the last 8760 hours. HbA1C: No results for input(s): HGBA1C in the last 72 hours. CBG: No results for input(s): GLUCAP in the last 168 hours. Lipid Profile: No results for input(s): CHOL, HDL, LDLCALC, TRIG, CHOLHDL, LDLDIRECT in the last 72 hours. Thyroid Function Tests: No results for input(s): TSH, T4TOTAL, FREET4, T3FREE, THYROIDAB in the last 72 hours. Anemia Panel: No results for input(s): VITAMINB12, FOLATE, FERRITIN, TIBC, IRON, RETICCTPCT in the last 72 hours. Sepsis Labs: No results for input(s): PROCALCITON, LATICACIDVEN in the last 168 hours.  Recent Results (from the past 240 hour(s))  Urine culture     Status: Abnormal   Collection Time: 02/13/17 11:44 AM  Result Value Ref Range Status   Specimen Description URINE, CATHETERIZED  Final   Special Requests Normal  Final   Culture MULTIPLE SPECIES PRESENT, SUGGEST RECOLLECTION (A)  Final   Report Status 02/14/2017 FINAL  Final         Radiology Studies: No results found.      Scheduled Meds: . aspirin EC  81 mg Oral Daily  . atorvastatin  40 mg Oral q1800  . bisacodyl  10 mg Rectal Once  . carvedilol  3.125 mg Oral BID WC  . feeding supplement (ENSURE ENLIVE)  237 mL Oral BID BM  . pantoprazole  40 mg Oral BID  . polyethylene glycol  17 g Oral BID   Continuous Infusions: . dextrose 5 % and 0.45% NaCl 100 mL/hr at 02/17/17 0525     LOS: 4 days     Darren Mainer, MD [facility] 02/17/2017, 3:47 PM Pager: [phone]   If 7PM-7AM, please contact night-coverage [email or web address] Password [email or web address] 02/17/2017, 3:47 PM

## 2017-02-18 ENCOUNTER — Other Ambulatory Visit (HOSPITAL_COMMUNITY): Payer: BLUE CROSS/BLUE SHIELD

## 2017-02-18 LAB — BASIC METABOLIC PANEL
ANION GAP: 6 (ref 5–15)
BUN: 37 mg/dL — ABNORMAL HIGH (ref 6–20)
CALCIUM: 8.2 mg/dL — AB (ref 8.9–10.3)
CO2: 21 mmol/L — ABNORMAL LOW (ref 22–32)
Chloride: 109 mmol/L (ref 101–111)
Creatinine, Ser: 3.19 mg/dL — ABNORMAL HIGH (ref 0.61–1.24)
GFR calc Af Amer: 22 mL/min — ABNORMAL LOW (ref >60)
GFR, EST NON AFRICAN AMERICAN: 19 mL/min — AB (ref >60)
GLUCOSE: 91 mg/dL (ref 65–99)
POTASSIUM: 4.4 mmol/L (ref 3.5–5.1)
Sodium: 136 mmol/L (ref 135–145)

## 2017-02-18 NOTE — Progress Notes (Signed)
PROGRESS NOTE    Darren Carr  ZOX:[id number] DOB: 08-30-1951 DOA: 02/13/2017 PCP: [name], DO   Brief Narrative: Darren Carr is a 66 y.o. male with history of coronary artery disease, hypertension, paroxysmal atrial fibrillation, and metastatic bladder cancer status post robotic cystoprostatectomy with lymphadenectomy and ileal conduit urinary diversion in June 2016, left hydronephrosis with chronic left nephroureteral catheter that drains into his urostomy (last exchange on 5/3), and kidney stones. He presented with abdominal pain, nausea and vomiting, found to have acute renal failure secondary to hydronephrosis secondary to clogged nephroureteral catheter. Stent was replaced and his kidney injury is improving. Recommend to continue IV fluids for another 24 hours and check renal parameters in am, before discharge in am.    Assessment & Plan:   Principal Problem:   Acute renal failure superimposed on stage 3 chronic kidney disease ([place]) Active Problems:   Bladder cancer (HCC)   Hydronephrosis of left kidney   UTI (urinary tract infection) due to urinary indwelling catheter (HCC)   GERD (gastroesophageal reflux disease)   Constipation   Occluded left nephroureteral stent AKI Stent exchanged.  Creatinine continues to slowly improve. BUN improving as well. Baseline creatinine 2.7-2.8, creatinine is down to 3.14 to 3.19.hasn't improved much in the last 24 hours, would recomemend another 24 hours of IV fluids and check creatinine in am.  His hematuria has improved.  -Oxycodone 10mg  q4 hours prn for pain  And IV dilaudid for break through pain.  - he reports his pain is much better today than the last one week.   Concern for UTI Urine culture significant for multiple species. Afebrile. WBC wnl. -discontinued Zosyn - hematuria resolved.   Severe malnutrition related to chronic illness -dietitian recommendations: protein supplement and liberalized diet to regular diet.     DVT prophylaxis: SCDs Code Status: Full code Family Communication: none at bedside.  Disposition Plan: Discharge in likely 1-2 days pending improvement of kidney function    Consultants:   Urology  Procedures:   nephroureteral stent exchange  Antimicrobials:  Zosyn discontinue don 6/5   Subjective: Back pain improved, no nausea, vomiting .  abd pain has imrpoved.    Objective: Vitals:   02/17/17 1405 02/17/17 2152 02/18/17 0609 02/18/17 1539  BP: 102/75 107/70 125/83 115/80  Pulse: (!) 56 63 65 63  Resp:  18 18   Temp: 98.4 F (36.9 C) 98 F (36.7 C) 97.8 F (36.6 C) 98.7 F (37.1 C)  TempSrc: Oral Oral Oral Oral  SpO2: 100% 100% 100% 99%  Weight:      Height:        Intake/Output Summary (Last 24 hours) at 02/18/17 1804 Last data filed at 02/18/17 1500  Gross per 24 hour  Intake          1873.75 ml  Output             2300 ml  Net          -426.25 ml   Filed Weights   02/13/17 0734 02/13/17 1510  Weight: 71.2 kg (157 lb) 69.6 kg (153 lb 7 oz)    Examination:  General exam: appears comfortable.  Respiratory system: clear lungs No wheezing or rhonchi.  Cardiovascular system: s1s2 heard.  Gastrointestinal system: Abdomen is soft non tender  Urostomy present.  Central nervous system: non focal.  Extremities: no edema.  Skin: No cyanosis. No rashes Psychiatry:no anxiety.     Data Reviewed: I have personally reviewed following labs and imaging studies  CBC:  Recent Labs Lab 02/13/17 0753 02/14/17 0518  WBC 14.9* 9.5  HGB 13.7 10.3*  HCT 39.5 31.2*  MCV 84.0 84.3  PLT 354 962   Basic Metabolic Panel:  Recent Labs Lab 02/14/17 0518 02/15/17 0514 02/16/17 0738 02/17/17 0621 02/18/17 0459  NA 137 137 135 136 136  K 4.2 4.1 3.9 4.2 4.4  CL 110 108 107 108 109  CO2 18* 21* 21* 21* 21*  GLUCOSE 104* 101* 108* 106* 91  BUN 97* 77* 53* 43* 37*  CREATININE 4.27* 3.74* 3.40* 3.13* 3.19*  CALCIUM 7.9* 7.9* 8.1* 8.0* 8.2*    GFR: Estimated Creatinine Clearance: 23 mL/min (A) (by C-G formula based on SCr of 3.19 mg/dL (H)). Liver Function Tests:  Recent Labs Lab 02/13/17 0753  AST 35  ALT 65*  ALKPHOS 183*  BILITOT 0.5  PROT 8.7*  ALBUMIN 3.3*    Recent Labs Lab 02/13/17 0753  LIPASE 52*   No results for input(s): AMMONIA in the last 168 hours. Coagulation Profile: No results for input(s): INR, PROTIME in the last 168 hours. Cardiac Enzymes: No results for input(s): CKTOTAL, CKMB, CKMBINDEX, TROPONINI in the last 168 hours. BNP (last 3 results) No results for input(s): PROBNP in the last 8760 hours. HbA1C: No results for input(s): HGBA1C in the last 72 hours. CBG: No results for input(s): GLUCAP in the last 168 hours. Lipid Profile: No results for input(s): CHOL, HDL, LDLCALC, TRIG, CHOLHDL, LDLDIRECT in the last 72 hours. Thyroid Function Tests: No results for input(s): TSH, T4TOTAL, FREET4, T3FREE, THYROIDAB in the last 72 hours. Anemia Panel: No results for input(s): VITAMINB12, FOLATE, FERRITIN, TIBC, IRON, RETICCTPCT in the last 72 hours. Sepsis Labs: No results for input(s): PROCALCITON, LATICACIDVEN in the last 168 hours.  Recent Results (from the past 240 hour(s))  Urine culture     Status: Abnormal   Collection Time: 02/13/17 11:44 AM  Result Value Ref Range Status   Specimen Description URINE, CATHETERIZED  Final   Special Requests Normal  Final   Culture MULTIPLE SPECIES PRESENT, SUGGEST RECOLLECTION (A)  Final   Report Status 02/14/2017 FINAL  Final         Radiology Studies: No results found.      Scheduled Meds: . aspirin EC  81 mg Oral Daily  . atorvastatin  40 mg Oral q1800  . bisacodyl  10 mg Rectal Once  . carvedilol  3.125 mg Oral BID WC  . feeding supplement (ENSURE ENLIVE)  237 mL Oral BID BM  . pantoprazole  40 mg Oral BID  . polyethylene glycol  17 g Oral BID   Continuous Infusions: . sodium chloride 75 mL/hr at 02/18/17 0639     LOS: 5  days   Time spent is 35 minutes.   Hosie Poisson, MD [facility] 02/18/2017, 6:04 PM Pager: [phone]   If 7PM-7AM, please contact night-coverage [email or web address] Password [facility] 02/18/2017, 6:04 PM

## 2017-02-19 LAB — BASIC METABOLIC PANEL
Anion gap: 8 (ref 5–15)
BUN: 38 mg/dL — AB (ref 6–20)
CHLORIDE: 109 mmol/L (ref 101–111)
CO2: 20 mmol/L — AB (ref 22–32)
CREATININE: 2.88 mg/dL — AB (ref 0.61–1.24)
Calcium: 8.3 mg/dL — ABNORMAL LOW (ref 8.9–10.3)
GFR calc Af Amer: 25 mL/min — ABNORMAL LOW (ref >60)
GFR calc non Af Amer: 22 mL/min — ABNORMAL LOW (ref >60)
Glucose, Bld: 103 mg/dL — ABNORMAL HIGH (ref 65–99)
Potassium: 5.2 mmol/L — ABNORMAL HIGH (ref 3.5–5.1)
Sodium: 137 mmol/L (ref 135–145)

## 2017-02-19 LAB — POTASSIUM: Potassium: 4.4 mmol/L (ref 3.5–5.1)

## 2017-02-19 MED ORDER — ASPIRIN 81 MG PO TBEC: 81.0000 mg | DELAYED_RELEASE_TABLET | Freq: Every day | ORAL | Status: DC

## 2017-02-19 MED ORDER — SODIUM POLYSTYRENE SULFONATE 15 GM/60ML PO SUSP
30.0000 g | Freq: Once | ORAL | Status: AC
  Administered 2017-02-19: 30 g via ORAL
  Filled 2017-02-19: qty 120

## 2017-02-19 NOTE — Progress Notes (Signed)
Went over discharge paperwork with patient and family, all questions answered.  VSS.  PT refused wheelchair, requested to walk to car with wife.

## 2017-02-19 NOTE — Discharge Summary (Signed)
Physician Discharge Summary  Darren Carr PVX:[id number] DOB: [birth year] DOA: 02/13/2017  PCP: [name], DO  Admit date: 02/13/2017 Discharge date: 02/19/2017  Admitted From: home.  Disposition: HOme.   Recommendations for Outpatient Follow-up:  1. Follow up with PCP in 1-2 weeks 2. Please obtain BMP/CBC in one week Please follow up with urology as recommended.     Discharge Condition:stable.  CODE STATUS: full code.  Diet recommendation: Heart Healthy   Brief/Interim Summary: Darren Carr is a 66 y.o. malewith history of coronary artery disease, hypertension, paroxysmal atrial fibrillation, and metastatic bladder cancer status post robotic cystoprostatectomy with lymphadenectomy and ileal conduit urinary diversion in June 2016, left hydronephrosis with chronic left nephroureteral catheter that drains into his urostomy (last exchange on 5/3), andkidney stones. He presented with abdominal pain, nausea and vomiting, found to have acute renal failure secondary to hydronephrosis secondary to clogged nephroureteral catheter. Stent was replaced and his kidney injury is improving. Creatinine is back to baseline . Discharge patient and outpatient follow up with urology as recommended.   Discharge Diagnoses:  Principal Problem:   Acute renal failure superimposed on stage 3 chronic kidney disease (HCC) Active Problems:   Bladder cancer (HCC)   Hydronephrosis of left kidney   UTI (urinary tract infection) due to urinary indwelling catheter (HCC)   GERD (gastroesophageal reflux disease)   Constipation  Occluded left nephroureteral stent AKI Stent exchanged.  Creatinine continues to slowly improve. BUN improving as well. Baseline creatinine 2.7-2.8,  His hematuria has improved.  -Oxycodone 10mg  q4 hours prn for pain  - he reports his pain is much better today than the last one week.   - currently creatinine back to baseline. Recommend outpatient follow up with urology .   Concern  for UTI Urine culture significant for multiple species. Afebrile. WBC wnl. -discontinued Zosyn - hematuria resolved.   Severe malnutrition related to chronic illness -dietitian recommendations: protein supplement and liberalized diet to regular diet.    Discharge Instructions  Discharge Instructions    Diet - low sodium heart healthy    Complete by:  As directed    Discharge instructions    Complete by:  As directed    Please follow up with urology as recommended.     Allergies as of 02/19/2017   No Known Allergies     Medication List    STOP taking these medications   amiodarone 200 MG tablet Commonly known as:  PACERONE   lisinopril 2.5 MG tablet Commonly known as:  PRINIVIL,ZESTRIL     TAKE these medications   aspirin 81 MG EC tablet Take 1 tablet (81 mg total) by mouth daily. Start taking on:  02/20/2017   atorvastatin 40 MG tablet Commonly known as:  LIPITOR Take 1 tablet (40 mg total) by mouth daily at 6 PM.   carvedilol 3.125 MG tablet Commonly known as:  COREG Take 1 tablet (3.125 mg total) by mouth 2 (two) times daily with a meal.   nitroGLYCERIN 0.4 MG SL tablet Commonly known as:  NITROSTAT Place 1 tablet (0.4 mg total) under the tongue every 5 (five) minutes x 3 doses as needed for chest pain.   Oxycodone HCl 10 MG Tabs Take 1 tablet (10 mg total) by mouth every 4 (four) hours as needed (pain).   pantoprazole 40 MG tablet Commonly known as:  PROTONIX Take 1 tablet (40 mg total) by mouth 2 (two) times daily.      Follow-up Information    [name], DO.  Schedule an appointment as soon as possible for a visit in 1 week(s).   Why:  follow  up post hospitalization visit.  Contact information: [address] [place] [address] [phone]          No Known Allergies  Consultations:  urology   Procedures/Studies: Ct Abdomen Pelvis Wo Contrast  Result Date: 02/13/2017 CLINICAL DATA:  Bladder cancer, status post cystectomy  and urostomy. Intermittent hematuria with abdominal pain and decreased appetite. Lower abdominal pain. EXAM: CT ABDOMEN AND PELVIS WITHOUT CONTRAST TECHNIQUE: Multidetector CT imaging of the abdomen and pelvis was performed following the standard protocol without IV contrast. COMPARISON:  01/14/2017 FINDINGS: Lower chest: Dependent bibasilar atelectasis. No pericardial or pleural effusion. No focal basilar pneumonia. Normal heart size. Moderate hiatal hernia noted. Hepatobiliary: No focal liver abnormality is seen. No gallstones, gallbladder wall thickening, or biliary dilatation. Pancreas: Unremarkable. No pancreatic ductal dilatation or surrounding inflammatory changes. Spleen: Normal in size without focal abnormality. Adrenals/Urinary Tract: Normal adrenal glands. Patient is status post cystectomy and right abdominal ileal conduit/urostomy. Through the urostomy, there is a left nephroureteral catheter with the retention loop in the left renal pelvis. Compared to the prior study, there is increased left hydroureteronephrosis. Chronic atrophy of the right kidney without obstruction or hydronephrosis. Punctate nonobstructing intrarenal calculi bilaterally. Stomach/Bowel: Negative for bowel obstruction, significant dilatation ileus, or free air. Postop changes of the lower abdomen and pelvis. Appendix not visualized. Retained stool throughout right colon compatible with constipation. Vascular/Lymphatic: Atherosclerosis of the aorta and iliac vessels. Small scattered nonenlarged retroperitoneal lymph nodes. No adenopathy. Reproductive: Remote cystectomy and prostatectomy. Other: No inguinal or abdominal wall hernia. Musculoskeletal: Degenerative changes of the spine. No acute osseous finding. Bones are osteopenic. IMPRESSION: Remote cystectomy and right lower quadrant ileal conduit/urostomy. Chronic indwelling left nephroureteral catheter via the ostomy. Stable catheter position but there is interval development of  left hydroureteronephrosis suggesting tubal occlusion. Bibasilar atelectasis Small hiatal hernia Nonobstructing intrarenal calculi bilaterally Aortoiliac atherosclerosis without aneurysm Electronically Signed   By: Jerilynn Mages.  Shick M.D.   On: 02/13/2017 10:36   Ir Nephrostomy Exchange Left  Result Date: 02/13/2017 INDICATION: Urosepsis, occluded left nephroureteral catheter via urostomy EXAM: FLUOROSCOPIC EXCHANGE OF THE LEFT NEPHROURETERAL CATHETER COMPARISON:  02/13/2017 MEDICATIONS: None. ANESTHESIA/SEDATION: None. CONTRAST:  This cc Isovue - administered into the collecting system(s) FLUOROSCOPY TIME:  Fluoroscopy Time: 1 minutes 6 seconds (14 mGy). COMPLICATIONS: None immediate. PROCEDURE: Informed written consent was obtained from the patient after a thorough discussion of the procedural risks, benefits and alternatives. All questions were addressed. Maximal Sterile Barrier Technique was utilized including caps, mask, sterile gowns, sterile gloves, sterile drape, hand hygiene and skin antiseptic. A timeout was performed prior to the initiation of the procedure. Under sterile conditions, the existing 12 French 45 cm nephroureteral catheter was cut and removed over a Bentson guidewire. A new 12 French catheter advanced over Bentson guidewire with the retention loop formed the renal pelvis. Contrast injection confirms position. Images obtained for documentation. Catheter secured within the urostomy bag. IMPRESSION: Successful fluoroscopic exchange of the left nephroureteral catheter via the existing urostomy. Electronically Signed   By: Jerilynn Mages.  Shick M.D.   On: 02/13/2017 13:59       Subjective:  No chest pain , sob or nausea, or vomiting.  Discharge Exam: Vitals:   02/19/17 0643 02/19/17 1340  BP: 106/72 97/74  Pulse: 92 69  Resp: 17 18  Temp: 97.9 F (36.6 C) 98.2 F (36.8 C)   Vitals:  02/18/17 1539 02/18/17 2038 02/19/17 0643 02/19/17 1340  BP: 115/80 116/85 106/72 97/74  Pulse: 63 64 92 69   Resp:  16 17 18   Temp: 98.7 F (37.1 C) 97.9 F (36.6 C) 97.9 F (36.6 C) 98.2 F (36.8 C)  TempSrc: Oral Oral Oral Oral  SpO2: 99% 100% 100% 99%  Weight:   73.1 kg (161 lb 3.2 oz)   Height:        General: Pt is alert, awake, not in acute distress Cardiovascular: RRR, S1/S2 +, no rubs, no gallops Respiratory: CTA bilaterally, no wheezing, no rhonchi Abdominal: Soft, NT, ND, bowel sounds + Extremities: no edema, no cyanosis    The results of significant diagnostics from this hospitalization (including imaging, microbiology, ancillary and laboratory) are listed below for reference.     Microbiology: Recent Results (from the past 240 hour(s))  Urine culture     Status: Abnormal   Collection Time: 02/13/17 11:44 AM  Result Value Ref Range Status   Specimen Description URINE, CATHETERIZED  Final   Special Requests Normal  Final   Culture MULTIPLE SPECIES PRESENT, SUGGEST RECOLLECTION (A)  Final   Report Status 02/14/2017 FINAL  Final     Labs: BNP (last 3 results) No results for input(s): BNP in the last 8760 hours. Basic Metabolic Panel:  Recent Labs Lab 02/15/17 0514 02/16/17 0738 02/17/17 0621 02/18/17 0459 02/19/17 0534 02/19/17 1211  NA 137 135 136 136 137  --   K 4.1 3.9 4.2 4.4 5.2* 4.4  CL 108 107 108 109 109  --   CO2 21* 21* 21* 21* 20*  --   GLUCOSE 101* 108* 106* 91 103*  --   BUN 77* 53* 43* 37* 38*  --   CREATININE 3.74* 3.40* 3.13* 3.19* 2.88*  --   CALCIUM 7.9* 8.1* 8.0* 8.2* 8.3*  --    Liver Function Tests:  Recent Labs Lab 02/13/17 0753  AST 35  ALT 65*  ALKPHOS 183*  BILITOT 0.5  PROT 8.7*  ALBUMIN 3.3*    Recent Labs Lab 02/13/17 0753  LIPASE 52*   No results for input(s): AMMONIA in the last 168 hours. CBC:  Recent Labs Lab 02/13/17 0753 02/14/17 0518  WBC 14.9* 9.5  HGB 13.7 10.3*  HCT 39.5 31.2*  MCV 84.0 84.3  PLT 354 295   Cardiac Enzymes: No results for input(s): CKTOTAL, CKMB, CKMBINDEX, TROPONINI in the  last 168 hours. BNP: Invalid input(s): POCBNP CBG: No results for input(s): GLUCAP in the last 168 hours. D-Dimer No results for input(s): DDIMER in the last 72 hours. Hgb A1c No results for input(s): HGBA1C in the last 72 hours. Lipid Profile No results for input(s): CHOL, HDL, LDLCALC, TRIG, CHOLHDL, LDLDIRECT in the last 72 hours. Thyroid function studies No results for input(s): TSH, T4TOTAL, T3FREE, THYROIDAB in the last 72 hours.  Invalid input(s): FREET3 Anemia work up No results for input(s): VITAMINB12, FOLATE, FERRITIN, TIBC, IRON, RETICCTPCT in the last 72 hours. Urinalysis    Component Value Date/Time   COLORURINE AMBER (A) 02/13/2017 1144   APPEARANCEUR CLOUDY (A) 02/13/2017 1144   LABSPEC 1.010 02/13/2017 1144   PHURINE 7.0 02/13/2017 1144   GLUCOSEU 50 (A) 02/13/2017 1144   HGBUR MODERATE (A) 02/13/2017 1144   BILIRUBINUR NEGATIVE 02/13/2017 1144   KETONESUR NEGATIVE 02/13/2017 1144   PROTEINUR 100 (A) 02/13/2017 1144   UROBILINOGEN 0.2 02/23/2015 2220   NITRITE NEGATIVE 02/13/2017 1144   LEUKOCYTESUR LARGE (A) 02/13/2017 1144   Sepsis Labs  Invalid input(s): PROCALCITONIN,  WBC,  LACTICIDVEN Microbiology Recent Results (from the past 240 hour(s))  Urine culture     Status: Abnormal   Collection Time: 02/13/17 11:44 AM  Result Value Ref Range Status   Specimen Description URINE, CATHETERIZED  Final   Special Requests Normal  Final   Culture MULTIPLE SPECIES PRESENT, SUGGEST RECOLLECTION (A)  Final   Report Status 02/14/2017 FINAL  Final     Time coordinating discharge: Over 30 minutes  SIGNED:   Hosie Poisson, MD  [facility] 02/19/2017, 1:50 PM Pager   If 7PM-7AM, please contact night-coverage [email or web address] Password [email or web address]

## 2017-03-01 ENCOUNTER — Other Ambulatory Visit: Payer: Self-pay | Admitting: Urology

## 2017-03-12 NOTE — Progress Notes (Signed)
10/13/2016- EKG noted in EPIC- see EKG results

## 2017-03-12 NOTE — Patient Instructions (Addendum)
Darren Carr  03/12/2017   Your procedure is scheduled on: Friday 03/26/2017  Report to [facility] Main  Entrance Take [place]  elevators to 3rd floor to  [place] at  [place]  AM.    Call this number if you have problems the morning of surgery [phone]   Remember: ONLY 1 PERSON MAY GO WITH YOU TO SHORT STAY TO GET  READY MORNING OF [place].              Follow Bowel prep instructions from Dr. Zettie Pho office the day before surgery with a clear liquid diet all day until midnight! Day before surgery on 03-25-17, no clear liquids after midnight on 03-25-17.              Drink one bottle Magnesium Citrate by noon on Thursday 03/25/2017!      CLEAR LIQUID DIET   Foods Allowed                                                                     Foods Excluded  Coffee and tea, regular and decaf                             liquids that you cannot  Plain Jell-O in any flavor                                             see through such as: Fruit ices (not with fruit pulp)                                     milk, soups, orange juice  Iced Popsicles                                    All solid food Carbonated beverages, regular and diet                                    Cranberry, grape and apple juices Sports drinks like Gatorade Lightly seasoned clear broth or consume(fat free) Sugar, honey syrup  Sample Menu Breakfast                                Lunch                                     Supper Cranberry juice                    Beef broth  Chicken broth Jell-O                                     Grape juice                           Apple juice Coffee or tea                        Jell-O                                      Popsicle                                                Coffee or tea                        Coffee or tea  _____________________________________________________________________     Do not eat  food or drink liquids :After Midnight.     Take these medicines the morning of surgery with A SIP OF WATER: carvidilol, gabapentin if needed, oxycontin pain pill if needed                                You may not have any metal on your body including hair pins and              piercings  Do not wear jewelry, make-up, lotions, powders or perfumes, deodorant             Do not wear nail polish.  Do not shave  48 hours prior to surgery.              Men may shave face and neck.   Do not bring valuables to the hospital. [place].  Contacts, dentures or bridgework may not be worn into surgery.  Leave suitcase in the car. After surgery it may be brought to your room.                  Please read over the following fact sheets you were given: _____________________________________________________________________             [facility] - Preparing for Surgery Before surgery, you can play an important role.  Because skin is not sterile, your skin needs to be as free of germs as possible.  You can reduce the number of germs on your skin by washing with CHG (chlorahexidine gluconate) soap before surgery.  CHG is an antiseptic cleaner which kills germs and bonds with the skin to continue killing germs even after washing. Please DO NOT use if you have an allergy to CHG or antibacterial soaps.  If your skin becomes reddened/irritated stop using the CHG and inform your nurse when you arrive at Short Stay. Do not shave (including legs and underarms) for at least 48 hours prior to the first CHG shower.  You may shave your face/neck. Please follow these instructions carefully:  1.  Shower with CHG Soap the night before surgery and the  morning of Surgery.  2.  If you choose to wash your hair, wash your hair first as usual with your  normal  shampoo.  3.  After you shampoo, rinse your hair and body thoroughly to remove the  shampoo.                            4.  Use CHG as you would any other liquid soap.  You can apply chg directly  to the skin and wash                       Gently with a scrungie or clean washcloth.  5.  Apply the CHG Soap to your body ONLY FROM THE NECK DOWN.   Do not use on face/ open                           Wound or open sores. Avoid contact with eyes, ears mouth and genitals (private parts).                       Wash face,  Genitals (private parts) with your normal soap.             6.  Wash thoroughly, paying special attention to the area where your surgery  will be performed.  7.  Thoroughly rinse your body with warm water from the neck down.  8.  DO NOT shower/wash with your normal soap after using and rinsing off  the CHG Soap.                9.  Pat yourself dry with a clean towel.            10.  Wear clean pajamas.            11.  Place clean sheets on your bed the night of your first shower and do not  sleep with pets. Day of Surgery : Do not apply any lotions/deodorants the morning of surgery.  Please wear clean clothes to the hospital/surgery center.  FAILURE TO FOLLOW THESE INSTRUCTIONS MAY RESULT IN THE CANCELLATION OF YOUR SURGERY PATIENT SIGNATURE_________________________________  NURSE SIGNATURE__________________________________  ________________________________________________________________________

## 2017-03-15 ENCOUNTER — Encounter (HOSPITAL_COMMUNITY): Payer: Self-pay

## 2017-03-15 ENCOUNTER — Encounter (HOSPITAL_COMMUNITY)
Admission: RE | Admit: 2017-03-15 | Discharge: 2017-03-15 | Disposition: A | Discharge status: Home / Self Care | Payer: BLUE CROSS/BLUE SHIELD | Source: Ambulatory Visit | Attending: Urology | Admitting: Urology

## 2017-03-15 DIAGNOSIS — Z01810 Encounter for preprocedural cardiovascular examination: Secondary | ICD-10-CM | POA: Insufficient documentation

## 2017-03-15 DIAGNOSIS — R9431 Abnormal electrocardiogram [ECG] [EKG]: Secondary | ICD-10-CM | POA: Diagnosis present

## 2017-03-15 DIAGNOSIS — Z01812 Encounter for preprocedural laboratory examination: Secondary | ICD-10-CM | POA: Insufficient documentation

## 2017-03-15 HISTORY — DX: Dyspnea, unspecified: R06.00

## 2017-03-15 LAB — TYPE AND SCREEN
ABO/RH(D): O POS
ANTIBODY SCREEN: NEGATIVE

## 2017-03-15 LAB — CBC
HCT: 39.1 % (ref 39.0–52.0)
Hemoglobin: 13.1 g/dL (ref 13.0–17.0)
MCH: 28.9 pg (ref 26.0–34.0)
MCHC: 33.5 g/dL (ref 30.0–36.0)
MCV: 86.1 fL (ref 78.0–100.0)
Platelets: 301 10*3/uL (ref 150–400)
RBC: 4.54 MIL/uL (ref 4.22–5.81)
RDW: 17.1 % — AB (ref 11.5–15.5)
WBC: 10.9 10*3/uL — ABNORMAL HIGH (ref 4.0–10.5)

## 2017-03-15 LAB — COMPREHENSIVE METABOLIC PANEL
ALBUMIN: 3.8 g/dL (ref 3.5–5.0)
ALK PHOS: 86 U/L (ref 38–126)
ALT: 13 U/L — AB (ref 17–63)
AST: 12 U/L — AB (ref 15–41)
Anion gap: 7 (ref 5–15)
BILIRUBIN TOTAL: 0.4 mg/dL (ref 0.3–1.2)
BUN: 38 mg/dL — ABNORMAL HIGH (ref 6–20)
CALCIUM: 8.6 mg/dL — AB (ref 8.9–10.3)
CO2: 17 mmol/L — ABNORMAL LOW (ref 22–32)
Chloride: 112 mmol/L — ABNORMAL HIGH (ref 101–111)
Creatinine, Ser: 2.76 mg/dL — ABNORMAL HIGH (ref 0.61–1.24)
GFR calc Af Amer: 26 mL/min — ABNORMAL LOW (ref >60)
GFR calc non Af Amer: 23 mL/min — ABNORMAL LOW (ref >60)
GLUCOSE: 81 mg/dL (ref 65–99)
Potassium: 5.4 mmol/L — ABNORMAL HIGH (ref 3.5–5.1)
Sodium: 136 mmol/L (ref 135–145)
TOTAL PROTEIN: 7.9 g/dL (ref 6.5–8.1)

## 2017-03-15 NOTE — Progress Notes (Signed)
SPOKE WITH DR Chester AWARE EKG RESULTS 10-13-16 AND PATIENT MEDICAL HISTORY, ORDER RECEIVED TO REPEAT EKG WITH PRE OP TODAY.

## 2017-03-16 NOTE — Progress Notes (Signed)
Requested lov and any cardiac testing from dr Terrence Dupont via fax fax confirmantion received and placed on chart

## 2017-03-18 NOTE — Progress Notes (Addendum)
Spoke with dr hatcher anesthesia about 03-15-17 ekg results and 04-12-17 ekg results, pt ok for surgery per dr hatcher. lov dr Terrence Dupont 11-27-16 dr Terrence Dupont on chart coronary stent report 04-25-15 dr Terrence Dupont on chart

## 2017-03-22 ENCOUNTER — Other Ambulatory Visit (HOSPITAL_COMMUNITY): Payer: BLUE CROSS/BLUE SHIELD

## 2017-03-26 ENCOUNTER — Inpatient Hospital Stay (HOSPITAL_COMMUNITY): Payer: BLUE CROSS/BLUE SHIELD | Admitting: Anesthesiology

## 2017-03-26 ENCOUNTER — Encounter (HOSPITAL_COMMUNITY)
Admission: RE | Disposition: A | Discharge status: Home / Self Care | Payer: Self-pay | Source: Ambulatory Visit | Attending: Urology

## 2017-03-26 ENCOUNTER — Encounter (HOSPITAL_COMMUNITY): Payer: Self-pay | Admitting: *Deleted

## 2017-03-26 ENCOUNTER — Inpatient Hospital Stay (HOSPITAL_COMMUNITY)
Admission: RE | Admit: 2017-03-26 | Discharge: 2017-03-30 | DRG: 660 | Disposition: A | Discharge status: Home / Self Care | Payer: BLUE CROSS/BLUE SHIELD | Source: Ambulatory Visit | Attending: Urology | Admitting: Urology

## 2017-03-26 DIAGNOSIS — K567 Ileus, unspecified: Secondary | ICD-10-CM | POA: Diagnosis not present

## 2017-03-26 DIAGNOSIS — I10 Essential (primary) hypertension: Secondary | ICD-10-CM | POA: Diagnosis present

## 2017-03-26 DIAGNOSIS — G8929 Other chronic pain: Secondary | ICD-10-CM | POA: Diagnosis present

## 2017-03-26 DIAGNOSIS — K219 Gastro-esophageal reflux disease without esophagitis: Secondary | ICD-10-CM | POA: Diagnosis present

## 2017-03-26 DIAGNOSIS — Z7982 Long term (current) use of aspirin: Secondary | ICD-10-CM | POA: Diagnosis not present

## 2017-03-26 DIAGNOSIS — N135 Crossing vessel and stricture of ureter without hydronephrosis: Secondary | ICD-10-CM | POA: Diagnosis present

## 2017-03-26 DIAGNOSIS — I480 Paroxysmal atrial fibrillation: Secondary | ICD-10-CM | POA: Diagnosis present

## 2017-03-26 DIAGNOSIS — I252 Old myocardial infarction: Secondary | ICD-10-CM

## 2017-03-26 DIAGNOSIS — Z8551 Personal history of malignant neoplasm of bladder: Secondary | ICD-10-CM

## 2017-03-26 DIAGNOSIS — I2510 Atherosclerotic heart disease of native coronary artery without angina pectoris: Secondary | ICD-10-CM | POA: Diagnosis present

## 2017-03-26 DIAGNOSIS — Z8249 Family history of ischemic heart disease and other diseases of the circulatory system: Secondary | ICD-10-CM | POA: Diagnosis not present

## 2017-03-26 DIAGNOSIS — Z79899 Other long term (current) drug therapy: Secondary | ICD-10-CM

## 2017-03-26 DIAGNOSIS — N1330 Unspecified hydronephrosis: Secondary | ICD-10-CM | POA: Diagnosis present

## 2017-03-26 DIAGNOSIS — Z7901 Long term (current) use of anticoagulants: Secondary | ICD-10-CM | POA: Diagnosis not present

## 2017-03-26 DIAGNOSIS — Z936 Other artificial openings of urinary tract status: Secondary | ICD-10-CM

## 2017-03-26 DIAGNOSIS — M549 Dorsalgia, unspecified: Secondary | ICD-10-CM | POA: Diagnosis present

## 2017-03-26 DIAGNOSIS — N131 Hydronephrosis with ureteral stricture, not elsewhere classified: Principal | ICD-10-CM | POA: Diagnosis present

## 2017-03-26 DIAGNOSIS — C679 Malignant neoplasm of bladder, unspecified: Secondary | ICD-10-CM | POA: Diagnosis present

## 2017-03-26 DIAGNOSIS — F17210 Nicotine dependence, cigarettes, uncomplicated: Secondary | ICD-10-CM | POA: Diagnosis present

## 2017-03-26 HISTORY — PX: URETERAL REIMPLANTION: SHX2611

## 2017-03-26 LAB — HEMOGLOBIN AND HEMATOCRIT, BLOOD
HCT: 34.7 % — ABNORMAL LOW (ref 39.0–52.0)
HEMOGLOBIN: 11.6 g/dL — AB (ref 13.0–17.0)

## 2017-03-26 SURGERY — REIMPLANTATION, URETER
Anesthesia: General | Laterality: Left

## 2017-03-26 MED ORDER — MIDAZOLAM HCL 5 MG/5ML IJ SOLN
INTRAMUSCULAR | Status: DC | PRN
  Administered 2017-03-26: 2 mg via INTRAVENOUS

## 2017-03-26 MED ORDER — MIDAZOLAM HCL 2 MG/2ML IJ SOLN
INTRAMUSCULAR | Status: AC
  Filled 2017-03-26: qty 2

## 2017-03-26 MED ORDER — PROPOFOL 500 MG/50ML IV EMUL
INTRAVENOUS | Status: DC | PRN
  Administered 2017-03-26: 20 ug/kg/min via INTRAVENOUS

## 2017-03-26 MED ORDER — EPHEDRINE SULFATE-NACL 50-0.9 MG/10ML-% IV SOSY
PREFILLED_SYRINGE | INTRAVENOUS | Status: DC | PRN
  Administered 2017-03-26 (×3): 5 mg via INTRAVENOUS

## 2017-03-26 MED ORDER — SODIUM CHLORIDE 0.9% FLUSH: 9.0000 mL | INTRAVENOUS | Status: DC | PRN

## 2017-03-26 MED ORDER — DIPHENHYDRAMINE HCL 12.5 MG/5ML PO ELIX: 12.5000 mg | ORAL_SOLUTION | Freq: Four times a day (QID) | ORAL | Status: DC | PRN

## 2017-03-26 MED ORDER — LIDOCAINE 2% (20 MG/ML) 5 ML SYRINGE
INTRAMUSCULAR | Status: DC | PRN
  Administered 2017-03-26 (×2): 50 mg via INTRAVENOUS

## 2017-03-26 MED ORDER — BOOST / RESOURCE BREEZE PO LIQD
1.0000 | Freq: Three times a day (TID) | ORAL | Status: DC
  Administered 2017-03-28: 1 via ORAL

## 2017-03-26 MED ORDER — CLINDAMYCIN PHOSPHATE 600 MG/50ML IV SOLN
600.0000 mg | Freq: Once | INTRAVENOUS | Status: AC
  Administered 2017-03-26: 600 mg via INTRAVENOUS
  Filled 2017-03-26: qty 50

## 2017-03-26 MED ORDER — HYDROMORPHONE HCL 1 MG/ML IJ SOLN
INTRAMUSCULAR | Status: DC | PRN
  Administered 2017-03-26: 0.5 mg via INTRAVENOUS
  Administered 2017-03-26: 1 mg via INTRAVENOUS
  Administered 2017-03-26: 0.5 mg via INTRAVENOUS

## 2017-03-26 MED ORDER — ONDANSETRON HCL 4 MG/2ML IJ SOLN: 4.0000 mg | INTRAMUSCULAR | Status: DC | PRN

## 2017-03-26 MED ORDER — PROPOFOL 10 MG/ML IV BOLUS
INTRAVENOUS | Status: AC
  Filled 2017-03-26: qty 40

## 2017-03-26 MED ORDER — ORAL CARE MOUTH RINSE
15.0000 mL | Freq: Two times a day (BID) | OROMUCOSAL | Status: DC
  Administered 2017-03-26 – 2017-03-30 (×6): 15 mL via OROMUCOSAL

## 2017-03-26 MED ORDER — SUGAMMADEX SODIUM 200 MG/2ML IV SOLN
INTRAVENOUS | Status: DC | PRN
  Administered 2017-03-26: 200 mg via INTRAVENOUS

## 2017-03-26 MED ORDER — CIPROFLOXACIN IN D5W 400 MG/200ML IV SOLN
INTRAVENOUS | Status: AC
  Filled 2017-03-26: qty 200

## 2017-03-26 MED ORDER — ONDANSETRON HCL 4 MG/2ML IJ SOLN
INTRAMUSCULAR | Status: DC | PRN
Start: 2017-03-26 — End: 2017-03-26
  Administered 2017-03-26: 4 mg via INTRAVENOUS

## 2017-03-26 MED ORDER — HEPARIN SODIUM (PORCINE) 5000 UNIT/ML IJ SOLN
5000.0000 [IU] | Freq: Three times a day (TID) | INTRAMUSCULAR | Status: DC
  Administered 2017-03-27 – 2017-03-29 (×9): 5000 [IU] via SUBCUTANEOUS
  Filled 2017-03-26 (×10): qty 1

## 2017-03-26 MED ORDER — DEXAMETHASONE SODIUM PHOSPHATE 10 MG/ML IJ SOLN
INTRAMUSCULAR | Status: DC | PRN
  Administered 2017-03-26: 10 mg via INTRAVENOUS

## 2017-03-26 MED ORDER — PROPOFOL 10 MG/ML IV BOLUS
INTRAVENOUS | Status: DC | PRN
  Administered 2017-03-26: 100 mg via INTRAVENOUS

## 2017-03-26 MED ORDER — DEXTROSE-NACL 5-0.45 % IV SOLN
INTRAVENOUS | Status: DC
  Administered 2017-03-26 – 2017-03-29 (×8): via INTRAVENOUS

## 2017-03-26 MED ORDER — PANTOPRAZOLE SODIUM 40 MG PO TBEC
40.0000 mg | DELAYED_RELEASE_TABLET | Freq: Two times a day (BID) | ORAL | Status: DC
  Administered 2017-03-26 – 2017-03-30 (×7): 40 mg via ORAL
  Filled 2017-03-26 (×8): qty 1

## 2017-03-26 MED ORDER — SODIUM CHLORIDE 0.9 % IJ SOLN
INTRAMUSCULAR | Status: DC | PRN
  Administered 2017-03-26: 20 mL

## 2017-03-26 MED ORDER — CIPROFLOXACIN IN D5W 400 MG/200ML IV SOLN
400.0000 mg | INTRAVENOUS | Status: AC
  Administered 2017-03-26: 400 mg via INTRAVENOUS

## 2017-03-26 MED ORDER — FENTANYL CITRATE (PF) 250 MCG/5ML IJ SOLN
INTRAMUSCULAR | Status: AC
  Filled 2017-03-26: qty 5

## 2017-03-26 MED ORDER — ATORVASTATIN CALCIUM 40 MG PO TABS
40.0000 mg | ORAL_TABLET | Freq: Every day | ORAL | Status: DC
  Administered 2017-03-26 – 2017-03-29 (×4): 40 mg via ORAL
  Filled 2017-03-26 (×4): qty 1

## 2017-03-26 MED ORDER — HYDROMORPHONE HCL-NACL 0.5-0.9 MG/ML-% IV SOSY
0.2500 mg | PREFILLED_SYRINGE | INTRAVENOUS | Status: DC | PRN
  Administered 2017-03-26 (×2): 0.5 mg via INTRAVENOUS

## 2017-03-26 MED ORDER — OXYCODONE HCL 10 MG PO TABS: 10.0000 mg | ORAL_TABLET | Freq: Four times a day (QID) | ORAL | 0 refills | Status: DC | PRN

## 2017-03-26 MED ORDER — CLINDAMYCIN PHOSPHATE 900 MG/50ML IV SOLN
900.0000 mg | Freq: Once | INTRAVENOUS | Status: AC
  Administered 2017-03-26: 900 mg via INTRAVENOUS
  Filled 2017-03-26: qty 50

## 2017-03-26 MED ORDER — HYDROMORPHONE 1 MG/ML IV SOLN
INTRAVENOUS | Status: DC
  Administered 2017-03-26: 12:00:00 via INTRAVENOUS
  Administered 2017-03-26: 1.5 mg via INTRAVENOUS
  Administered 2017-03-26: 3.3 mg via INTRAVENOUS
  Administered 2017-03-27: 1.5 mg via INTRAVENOUS
  Administered 2017-03-27: 1.8 mg via INTRAVENOUS
  Administered 2017-03-27: 1.5 mg via INTRAVENOUS
  Administered 2017-03-27: 2.1 mg via INTRAVENOUS
  Administered 2017-03-27: 1.5 mg via INTRAVENOUS
  Administered 2017-03-27: 1.2 mg via INTRAVENOUS
  Administered 2017-03-27: 3 mg via INTRAVENOUS
  Administered 2017-03-28: 2.7 mg via INTRAVENOUS
  Administered 2017-03-28: 1.2 mg via INTRAVENOUS
  Administered 2017-03-28: 2.4 mg via INTRAVENOUS
  Administered 2017-03-28: 5.7 mg via INTRAVENOUS
  Administered 2017-03-28: 0.6 mg via INTRAVENOUS
  Administered 2017-03-29: 1.2 mg via INTRAVENOUS
  Administered 2017-03-29: 2.7 mg via INTRAVENOUS
  Administered 2017-03-29: 2.4 mg via INTRAVENOUS
  Administered 2017-03-29: 3.3 mg via INTRAVENOUS
  Administered 2017-03-29: 1.8 mg via INTRAVENOUS
  Filled 2017-03-26: qty 25

## 2017-03-26 MED ORDER — HYDROMORPHONE HCL-NACL 0.5-0.9 MG/ML-% IV SOSY
0.2500 mg | PREFILLED_SYRINGE | INTRAVENOUS | Status: DC | PRN
  Administered 2017-03-26 (×4): 0.5 mg via INTRAVENOUS

## 2017-03-26 MED ORDER — SODIUM CHLORIDE 0.9 % IJ SOLN
INTRAMUSCULAR | Status: AC
  Filled 2017-03-26: qty 20

## 2017-03-26 MED ORDER — HYDROMORPHONE HCL-NACL 0.5-0.9 MG/ML-% IV SOSY
PREFILLED_SYRINGE | INTRAVENOUS | Status: AC
  Filled 2017-03-26: qty 2

## 2017-03-26 MED ORDER — ROCURONIUM BROMIDE 10 MG/ML (PF) SYRINGE
PREFILLED_SYRINGE | INTRAVENOUS | Status: DC | PRN
  Administered 2017-03-26 (×4): 10 mg via INTRAVENOUS
  Administered 2017-03-26: 50 mg via INTRAVENOUS
  Administered 2017-03-26: 10 mg via INTRAVENOUS

## 2017-03-26 MED ORDER — CARVEDILOL 3.125 MG PO TABS
3.1250 mg | ORAL_TABLET | Freq: Two times a day (BID) | ORAL | Status: DC
  Administered 2017-03-26 – 2017-03-30 (×8): 3.125 mg via ORAL
  Filled 2017-03-26 (×8): qty 1

## 2017-03-26 MED ORDER — CLINDAMYCIN PHOSPHATE 900 MG/50ML IV SOLN
INTRAVENOUS | Status: AC
  Filled 2017-03-26: qty 50

## 2017-03-26 MED ORDER — SENNOSIDES-DOCUSATE SODIUM 8.6-50 MG PO TABS
2.0000 | ORAL_TABLET | Freq: Every day | ORAL | Status: DC
  Administered 2017-03-26 – 2017-03-29 (×3): 2 via ORAL
  Filled 2017-03-26 (×4): qty 2

## 2017-03-26 MED ORDER — ROCURONIUM BROMIDE 50 MG/5ML IV SOSY
PREFILLED_SYRINGE | INTRAVENOUS | Status: AC
Start: 2017-03-26 — End: ?
  Filled 2017-03-26: qty 5

## 2017-03-26 MED ORDER — SUGAMMADEX SODIUM 200 MG/2ML IV SOLN
INTRAVENOUS | Status: AC
Start: 2017-03-26 — End: ?
  Filled 2017-03-26: qty 2

## 2017-03-26 MED ORDER — EPHEDRINE 5 MG/ML INJ
INTRAVENOUS | Status: AC
  Filled 2017-03-26: qty 10

## 2017-03-26 MED ORDER — LACTATED RINGERS IV SOLN
INTRAVENOUS | Status: DC | PRN
  Administered 2017-03-26: 07:00:00 via INTRAVENOUS

## 2017-03-26 MED ORDER — 0.9 % SODIUM CHLORIDE (POUR BTL) OPTIME
TOPICAL | Status: DC | PRN
  Administered 2017-03-26: 2000 mL

## 2017-03-26 MED ORDER — BUPIVACAINE LIPOSOME 1.3 % IJ SUSP
20.0000 mL | Freq: Once | INTRAMUSCULAR | Status: AC
  Administered 2017-03-26: 20 mL
  Filled 2017-03-26: qty 20

## 2017-03-26 MED ORDER — DOCUSATE SODIUM 100 MG PO CAPS
100.0000 mg | ORAL_CAPSULE | Freq: Two times a day (BID) | ORAL | Status: DC
  Administered 2017-03-26 – 2017-03-30 (×7): 100 mg via ORAL
  Filled 2017-03-26 (×8): qty 1

## 2017-03-26 MED ORDER — NALOXONE HCL 0.4 MG/ML IJ SOLN: 0.4000 mg | INTRAMUSCULAR | Status: DC | PRN

## 2017-03-26 MED ORDER — HYDROMORPHONE HCL 2 MG/ML IJ SOLN
INTRAMUSCULAR | Status: AC
  Filled 2017-03-26: qty 1

## 2017-03-26 MED ORDER — SODIUM CHLORIDE 0.9 % IV SOLN
INTRAVENOUS | Status: DC | PRN
  Administered 2017-03-26: 09:00:00 via INTRAVENOUS

## 2017-03-26 MED ORDER — DIPHENHYDRAMINE HCL 50 MG/ML IJ SOLN
12.5000 mg | Freq: Four times a day (QID) | INTRAMUSCULAR | Status: DC | PRN
  Administered 2017-03-28: 12.5 mg via INTRAVENOUS
  Filled 2017-03-26: qty 1

## 2017-03-26 MED ORDER — ACETAMINOPHEN 500 MG PO TABS
1000.0000 mg | ORAL_TABLET | Freq: Four times a day (QID) | ORAL | Status: AC
  Administered 2017-03-26 – 2017-03-27 (×4): 1000 mg via ORAL
  Filled 2017-03-26 (×4): qty 2

## 2017-03-26 MED ORDER — FENTANYL CITRATE (PF) 100 MCG/2ML IJ SOLN
INTRAMUSCULAR | Status: DC | PRN
  Administered 2017-03-26 (×2): 50 ug via INTRAVENOUS
  Administered 2017-03-26: 100 ug via INTRAVENOUS
  Administered 2017-03-26: 50 ug via INTRAVENOUS

## 2017-03-26 MED ORDER — ONDANSETRON HCL 4 MG/2ML IJ SOLN
4.0000 mg | Freq: Four times a day (QID) | INTRAMUSCULAR | Status: DC | PRN
  Administered 2017-03-27 – 2017-03-28 (×2): 4 mg via INTRAVENOUS
  Filled 2017-03-26 (×2): qty 2

## 2017-03-26 MED ORDER — GABAPENTIN 100 MG PO CAPS
100.0000 mg | ORAL_CAPSULE | Freq: Three times a day (TID) | ORAL | Status: DC | PRN
  Administered 2017-03-27: 100 mg via ORAL
  Filled 2017-03-26: qty 1

## 2017-03-26 SURGICAL SUPPLY — 63 items
ADH SKN CLS APL DERMABOND .7 (GAUZE/BANDAGES/DRESSINGS) ×1
BAG URINE DRAINAGE (UROLOGICAL SUPPLIES) ×1 IMPLANT
BLADE HEX COATED 2.75 (ELECTRODE) ×3 IMPLANT
CATH FOLEY 2WAY SLVR  5CC 18FR (CATHETERS)
CATH FOLEY 2WAY SLVR 5CC 18FR (CATHETERS) ×1 IMPLANT
CHLORAPREP W/TINT 26ML (MISCELLANEOUS) ×3 IMPLANT
CLIP LIGATING HEM O LOK PURPLE (MISCELLANEOUS) ×6 IMPLANT
CLIP LIGATING HEMO O LOK GREEN (MISCELLANEOUS) ×6 IMPLANT
CLIP VESOCCLUDE LG 6/CT (Clip) IMPLANT
CLOSURE WOUND 1/2 X4 (GAUZE/BANDAGES/DRESSINGS)
COVER SURGICAL LIGHT HANDLE (MISCELLANEOUS) ×3 IMPLANT
DECANTER SPIKE VIAL GLASS SM (MISCELLANEOUS) IMPLANT
DERMABOND ADVANCED (GAUZE/BANDAGES/DRESSINGS) ×2
DERMABOND ADVANCED .7 DNX12 (GAUZE/BANDAGES/DRESSINGS) IMPLANT
DISSECTOR ROUND CHERRY 3/8 STR (MISCELLANEOUS) ×1 IMPLANT
DRAIN CHANNEL 10F 3/8 F FF (DRAIN) ×3 IMPLANT
DRAIN CHANNEL RND F F (WOUND CARE) ×2 IMPLANT
DRAPE LAPAROSCOPIC ABDOMINAL (DRAPES) ×2 IMPLANT
DRAPE WARM FLUID 44X44 (DRAPE) ×3 IMPLANT
DRSG TEGADERM 4X4.75 (GAUZE/BANDAGES/DRESSINGS) ×2 IMPLANT
ELECT NDL TIP 2.8 STRL (NEEDLE) ×1 IMPLANT
ELECT NEEDLE TIP 2.8 STRL (NEEDLE) IMPLANT
ELECT REM PT RETURN 15FT ADLT (MISCELLANEOUS) ×3 IMPLANT
EVACUATOR SILICONE 100CC (DRAIN) ×3 IMPLANT
GAUZE SPONGE 2X2 8PLY STRL LF (GAUZE/BANDAGES/DRESSINGS) IMPLANT
GAUZE SPONGE 4X4 12PLY STRL (GAUZE/BANDAGES/DRESSINGS) ×1 IMPLANT
GAUZE SPONGE 4X4 16PLY XRAY LF (GAUZE/BANDAGES/DRESSINGS) ×1 IMPLANT
GLOVE BIOGEL M STRL SZ7.5 (GLOVE) ×3 IMPLANT
GOWN STRL REUS W/TWL LRG LVL3 (GOWN DISPOSABLE) ×9 IMPLANT
KIT BASIN OR (CUSTOM PROCEDURE TRAY) ×3 IMPLANT
LOOP VESSEL MAXI BLUE (MISCELLANEOUS) ×3 IMPLANT
NEEDLE HYPO 22GX1.5 SAFETY (NEEDLE) ×2 IMPLANT
NS IRRIG 1000ML POUR BTL (IV SOLUTION) ×2 IMPLANT
PACK GENERAL/GYN (CUSTOM PROCEDURE TRAY) ×3 IMPLANT
PLUG CATH AND CAP STER (CATHETERS) ×1 IMPLANT
RETRACTOR WND ALEXIS 25 LRG (MISCELLANEOUS) IMPLANT
RTRCTR WOUND ALEXIS 25CM LRG (MISCELLANEOUS) ×3
SPONGE GAUZE 2X2 STER 10/PKG (GAUZE/BANDAGES/DRESSINGS) ×2
SPONGE LAP 18X18 X RAY DECT (DISPOSABLE) ×3 IMPLANT
SPONGE LAP 4X18 X RAY DECT (DISPOSABLE) ×1 IMPLANT
STAPLER VISISTAT 35W (STAPLE) IMPLANT
STENT SET URETHERAL LEFT 7FR (STENTS) ×2 IMPLANT
STRIP CLOSURE SKIN 1/2X4 (GAUZE/BANDAGES/DRESSINGS) IMPLANT
SUCTION FRAZIER HANDLE 10FR (MISCELLANEOUS)
SUCTION TUBE FRAZIER 10FR DISP (MISCELLANEOUS) IMPLANT
SUT CHROMIC 3 0 SH 27 (SUTURE) IMPLANT
SUT CHROMIC 4 0 RB 1X27 (SUTURE) ×2 IMPLANT
SUT ETHILON 3 0 PS 1 (SUTURE) ×2 IMPLANT
SUT GUT CHROMIC 3 0 (SUTURE) IMPLANT
SUT MNCRL AB 4-0 PS2 18 (SUTURE) ×2 IMPLANT
SUT PDS AB 1 CT1 27 (SUTURE) ×12 IMPLANT
SUT PDS AB 1 CTX 36 (SUTURE) ×2 IMPLANT
SUT VIC AB 3-0 SH 27 (SUTURE) ×6
SUT VIC AB 3-0 SH 27X BRD (SUTURE) IMPLANT
SUT VIC AB 4-0 RB1 27 (SUTURE) ×15
SUT VIC AB 4-0 RB1 27XBRD (SUTURE) IMPLANT
SYR 10ML LL (SYRINGE) ×3 IMPLANT
SYR CONTROL 10ML LL (SYRINGE) IMPLANT
SYRINGE 20CC LL (MISCELLANEOUS) ×4 IMPLANT
SYSTEM UROSTOMY GENTLE TOUCH (WOUND CARE) ×2 IMPLANT
TOWEL OR 17X26 10 PK STRL BLUE (TOWEL DISPOSABLE) ×3 IMPLANT
TUBE FEEDING 5FR 36IN KANGAROO (TUBING) ×1 IMPLANT
WATER STERILE IRR 1500ML POUR (IV SOLUTION) ×1 IMPLANT

## 2017-03-26 NOTE — Discharge Instructions (Signed)

## 2017-03-26 NOTE — Transfer of Care (Signed)
Immediate Anesthesia Transfer of Care Note  Patient: Darren Carr  Procedure(s) Performed: Procedure(s): OPEN URETERAL REIMPLANT TO CONDUIT (Left)  Patient Location: PACU  Anesthesia Type:General  Level of Consciousness:  sedated, patient cooperative and responds to stimulation  Airway & Oxygen Therapy:Patient Spontanous Breathing and Patient connected to face mask oxgen  Post-op Assessment:  Report given to PACU RN and Post -op Vital signs reviewed and stable  Post vital signs:  Reviewed and stable  Last Vitals:  Vitals:   03/26/17 0522  BP: 121/83  Pulse: 67  Resp: 18  Temp: 47.4 C    Complications: No apparent anesthesia complications

## 2017-03-26 NOTE — Anesthesia Procedure Notes (Signed)
Arterial Line Insertion Start/End7/13/2018 7:00 AM, 03/26/2017 7:10 AM Performed by: Lavina Hamman, attending, CRNA  Patient location: Pre-op. Preanesthetic checklist: patient identified, IV checked, site marked, risks and benefits discussed, surgical consent, monitors and equipment checked, pre-op evaluation, timeout performed and anesthesia consent Lidocaine 1% used for infiltration Left, radial was placed Catheter size: 20 Fr Hand hygiene performed  and maximum sterile barriers used  Allen's test indicative of satisfactory collateral circulation Attempts: 1 Procedure performed without using ultrasound guided technique. Following insertion, dressing applied and Biopatch. Post procedure assessment: normal and unchanged  Patient tolerated the procedure well with no immediate complications.

## 2017-03-26 NOTE — Interval H&P Note (Signed)
History and Physical Interval Note:  03/26/2017 7:05 AM  Darren Carr  has presented today for surgery, with the diagnosis of LEFT URETERAL STRICTURE  The various methods of treatment have been discussed with the patient and family. After consideration of risks, benefits and other options for treatment, the patient has consented to  Procedure(s): OPEN URETERAL REIMPLANT TO CONDUIT (Left) as a surgical intervention .  The patient's history has been reviewed, patient examined, no change in status, stable for surgery.  I have reviewed the patient's chart and labs.  Questions were answered to the patient's satisfaction.     Marzella Miracle

## 2017-03-26 NOTE — Anesthesia Preprocedure Evaluation (Addendum)
Anesthesia Evaluation  Patient identified by MRN, date of birth, ID band Patient awake    Reviewed: Allergy & Precautions, H&P , NPO status , Patient's Chart, lab work & pertinent test results, reviewed documented beta blocker date and time   Airway Mallampati: II  TM Distance: >3 FB Neck ROM: Full    Dental no notable dental hx. (+) Poor Dentition, Dental Advisory Given   Pulmonary Current Smoker,    Pulmonary exam normal breath sounds clear to auscultation       Cardiovascular hypertension, Pt. on medications and Pt. on home beta blockers + CAD and + Past MI   Rhythm:Regular Rate:Normal     Neuro/Psych negative neurological ROS  negative psych ROS   GI/Hepatic Neg liver ROS, GERD  Medicated and Controlled,  Endo/Other  negative endocrine ROS  Renal/GU Renal disease  negative genitourinary   Musculoskeletal   Abdominal   Peds  Hematology negative hematology ROS (+)   Anesthesia Other Findings   Reproductive/Obstetrics negative OB ROS                            Anesthesia Physical Anesthesia Plan  ASA: IV  Anesthesia Plan: General   Post-op Pain Management:    Induction: Intravenous  PONV Risk Score and Plan: 2 and Ondansetron and Dexamethasone  Airway Management Planned: Oral ETT  Additional Equipment: Arterial line  Intra-op Plan:   Post-operative Plan: Extubation in OR  Informed Consent: I have reviewed the patients History and Physical, chart, labs and discussed the procedure including the risks, benefits and alternatives for the proposed anesthesia with the patient or authorized representative who has indicated his/her understanding and acceptance.   Dental advisory given  Plan Discussed with: CRNA  Anesthesia Plan Comments:         Anesthesia Quick Evaluation
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
## 2017-03-26 NOTE — Anesthesia Procedure Notes (Signed)
Procedure Name: Intubation Date/Time: 03/26/2017 7:26 AM Performed by: Dawnn Nam, Virgel Gess Pre-anesthesia Checklist: Patient identified, Emergency Drugs available, Suction available, Patient being monitored and Timeout performed Patient Re-evaluated:Patient Re-evaluated prior to induction Oxygen Delivery Method: Circle system utilized Preoxygenation: Pre-oxygenation with 100% oxygen Induction Type: IV induction Ventilation: Mask ventilation without difficulty Laryngoscope Size: Mac and 4 Grade View: Grade II Tube type: Oral Tube size: 7.5 mm Number of attempts: 1 Airway Equipment and Method: Stylet Placement Confirmation: ETT inserted through vocal cords under direct vision,  positive ETCO2,  CO2 detector and breath sounds checked- equal and bilateral Secured at: 22 cm Tube secured with: Tape Dental Injury: Teeth and Oropharynx as per pre-operative assessment

## 2017-03-26 NOTE — Anesthesia Postprocedure Evaluation (Signed)
Anesthesia Post Note  Patient: Darren Carr  Procedure(s) Performed: Procedure(s) (LRB): OPEN URETERAL REIMPLANT TO CONDUIT (Left)     Patient location during evaluation: PACU Anesthesia Type: General Level of consciousness: awake and alert Pain management: pain level controlled Vital Signs Assessment: post-procedure vital signs reviewed and stable Respiratory status: spontaneous breathing, nonlabored ventilation, respiratory function stable and patient connected to nasal cannula oxygen Cardiovascular status: blood pressure returned to baseline and stable Postop Assessment: no signs of nausea or vomiting Anesthetic complications: no    Last Vitals:  Vitals:   03/26/17 1215 03/26/17 1230  BP: 113/75 113/73  Pulse: (!) 59 65  Resp: 14 15  Temp:  36.4 C    Last Pain:  Vitals:   03/26/17 1215  TempSrc:   PainSc: Asleep                 Ellanor Feuerstein,W. EDMOND

## 2017-03-26 NOTE — Brief Op Note (Signed)
03/26/2017  10:44 AM  PATIENT:  Darren Carr  66 y.o. male  PRE-OPERATIVE DIAGNOSIS:  LEFT URETERAL STRICTURE  POST-OPERATIVE DIAGNOSIS:  LEFT URETERAL STRICTURE  PROCEDURE:  Procedure(s): OPEN URETERAL REIMPLANT TO CONDUIT (Left)  SURGEON:  Surgeon(s) and Role:    * [name], MD - Primary  PHYSICIAN ASSISTANT:   ASSISTANTS: Debbrah Alar PA   ANESTHESIA:   general  EBL:  Total I/O In: 1100 [I.V.:1100] Out: 250 [Urine:200; Blood:50]  BLOOD ADMINISTERED:none  DRAINS: 1 - JP to bulb, 2 - Urostomy to gravity   LOCAL MEDICATIONS USED:  MARCAINE     SPECIMEN:  No Specimen  DISPOSITION OF SPECIMEN:  N/A  COUNTS:  YES  TOURNIQUET:  * No tourniquets in log *  DICTATION: .Other Dictation: Dictation Number [id number]  PLAN OF CARE: Admit to inpatient   PATIENT DISPOSITION:  PACU - hemodynamically stable.   Delay start of Pharmacological VTE agent (>24hrs) due to surgical blood loss or risk of bleeding: yes

## 2017-03-26 NOTE — Progress Notes (Signed)
Post-op note  Subjective: The patient is doing well.  No complaints.   Objective: Vital signs in last 24 hours: Temp:  [97.6 F (36.4 C)-98.1 F (36.7 C)] 97.9 F (36.6 C) (07/13 1256) Pulse Rate:  [59-72] 62 (07/13 1256) Resp:  [9-36] 11 (07/13 1703) BP: (113-131)/(73-89) 127/87 (07/13 1256) SpO2:  [96 %-100 %] 98 % (07/13 1703) Arterial Line BP: (121-134)/(70-79) 121/70 (07/13 1130) Weight:  [71.7 kg (158 lb)] 71.7 kg (158 lb) (07/13 0538)  Intake/Output from previous day: No intake/output data recorded. Intake/Output this shift: Total I/O In: 1500 [I.V.:1500] Out: 515 [Urine:425; Drains:40; Blood:50]  Physical Exam:  General: Alert and oriented. Abdomen: Soft, Nondistended. Incisions: Clean and dry, dermabond. JP drain with SS drainage.  GU: conduit drainign clear yellow urine, single blue bander stent in place.   Lab Results:  Recent Labs  03/26/17 1136  HGB 11.6*  HCT 34.7*    Assessment/Plan: POD#0 s/p left ureteral reimplant into ileal conduit.  1) Continue to monitor   Jonna Clark, MD   LOS: 0 days   Alaynna Kerwood L 03/26/2017, 5:44 PM

## 2017-03-27 LAB — CBC
HEMATOCRIT: 32.2 % — AB (ref 39.0–52.0)
Hemoglobin: 10.7 g/dL — ABNORMAL LOW (ref 13.0–17.0)
MCH: 28.2 pg (ref 26.0–34.0)
MCHC: 33.2 g/dL (ref 30.0–36.0)
MCV: 85 fL (ref 78.0–100.0)
PLATELETS: 219 10*3/uL (ref 150–400)
RBC: 3.79 MIL/uL — ABNORMAL LOW (ref 4.22–5.81)
RDW: 16.3 % — AB (ref 11.5–15.5)
WBC: 21.1 10*3/uL — ABNORMAL HIGH (ref 4.0–10.5)

## 2017-03-27 LAB — BASIC METABOLIC PANEL
Anion gap: 7 (ref 5–15)
BUN: 37 mg/dL — AB (ref 6–20)
CALCIUM: 7.9 mg/dL — AB (ref 8.9–10.3)
CO2: 22 mmol/L (ref 22–32)
Chloride: 105 mmol/L (ref 101–111)
Creatinine, Ser: 2.35 mg/dL — ABNORMAL HIGH (ref 0.61–1.24)
GFR calc Af Amer: 32 mL/min — ABNORMAL LOW (ref >60)
GFR, EST NON AFRICAN AMERICAN: 28 mL/min — AB (ref >60)
GLUCOSE: 137 mg/dL — AB (ref 65–99)
Potassium: 5 mmol/L (ref 3.5–5.1)
Sodium: 134 mmol/L — ABNORMAL LOW (ref 135–145)

## 2017-03-27 MED ORDER — SPIRITUS FRUMENTI
1.0000 | Freq: Three times a day (TID) | ORAL | Status: DC
  Filled 2017-03-27: qty 1

## 2017-03-27 MED ORDER — PANTOPRAZOLE SODIUM 40 MG PO TBEC
40.0000 mg | DELAYED_RELEASE_TABLET | Freq: Every day | ORAL | Status: DC
Start: 2017-03-27 — End: 2017-03-27

## 2017-03-27 MED ORDER — NITROGLYCERIN 0.4 MG SL SUBL: 0.4000 mg | SUBLINGUAL_TABLET | SUBLINGUAL | Status: DC | PRN

## 2017-03-27 NOTE — Progress Notes (Signed)
1 Day Post-Op Subjective: Patient reports his greatest complaint is that of reflux. He takes a proton pump inhibitor at home. He does also complain of some incisional pain.  Objective: Vital signs in last 24 hours: Temp:  [97.6 F (36.4 C)-98.9 F (37.2 C)] 98.7 F (37.1 C) (07/14 0557) Pulse Rate:  [59-72] 63 (07/14 0557) Resp:  [9-36] 13 (07/14 0813) BP: (109-131)/(73-89) 115/81 (07/14 0557) SpO2:  [96 %-100 %] 97 % (07/14 0813) Arterial Line BP: (121-134)/(70-79) 121/70 (07/13 1130)  Intake/Output from previous day: 07/13 0701 - 07/14 0700 In: 3233.3 [I.V.:3233.3] Out: 1920 [Urine:1775; Drains:95; Blood:50] Intake/Output this shift: Total I/O In: -  Out: 300 [Urine:300]  Physical Exam:  General:alert, cooperative and no distress GI: His abdomen is flat and soft. His incision looks good with no sign of infection or discharge. His stoma is pink and viable with a single stent in place and draining clear urine.    Recent Labs  03/26/17 1136 03/27/17 0532  HGB 11.6* 10.7*  HCT 34.7* 32.2*   BMET  Recent Labs  03/27/17 0532  NA 134*  K 5.0  CL 105  CO2 22  GLUCOSE 137*  BUN 37*  CREATININE 2.35*  CALCIUM 7.9*   No results for input(s): LABPT, INR in the last 72 hours. No results for input(s): LABURIN in the last 72 hours. Results for orders placed or performed during the hospital encounter of 02/13/17  Urine culture     Status: Abnormal   Collection Time: 02/13/17 11:44 AM  Result Value Ref Range Status   Specimen Description URINE, CATHETERIZED  Final   Special Requests Normal  Final   Culture MULTIPLE SPECIES PRESENT, SUGGEST RECOLLECTION (A)  Final   Report Status 02/14/2017 FINAL  Final    Studies/Results: No results found.  Assessment/Plan: He seems to be doing well postoperatively with anticipated incisional pain. No symptoms to suggest alcohol withdrawal/DT's. His drain output over the past 2 shifts has been 40 mL/55 mL. His white blood cell  count is 21.1 with a stable H&H.   1. I will advance his diet. 2. Begin proton pump inhibitor. 3. Recheck CBC in a.m.  Traye Bates C 03/27/2017, 8:22 AM

## 2017-03-28 NOTE — Progress Notes (Signed)
Initial Nutrition Assessment  DOCUMENTATION CODES:   Non-severe (moderate) malnutrition in context of chronic illness  INTERVENTION:   Continue Boost Breeze po TID, each supplement provides 250 kcal and 9 grams of protein Encourage PO intake RD to continue to monitor  NUTRITION DIAGNOSIS:   Malnutrition (Moderate) related to cancer and cancer related treatments, chronic illness as evidenced by moderate depletion of body fat, severe depletion of muscle mass.  GOAL:   Patient will meet greater than or equal to 90% of their needs  MONITOR:   PO intake, Supplement acceptance, Labs, Weight trends, I & O's  REASON FOR ASSESSMENT:   Malnutrition Screening Tool    ASSESSMENT:   66 y.o male patient with bladder cancer admitted for surgery. 7/13 : s/p OPEN URETERAL REIMPLANT TO CONDUIT (Left)  Patient in room with no family at bedside. Pt had N/V last night which emesis contained mostly food. Prior to episode pt consumed 75% of dinner meal. Diet has been downgraded to full liquid. Will continue Boost Breeze supplements for when patient's nausea is more controlled.   Weight is stable per chart review. Nutrition-Focused physical exam completed. Findings are mild-moderate fat depletion, severe muscle depletion, and no edema.   Medications: Colace capsule BID, Prtotonix tablet BID, Senokot-S tablet daily, D5 -.45% NaCl infusion at 100 ml/hr -provides 408 kcal, IV Zofran PRN Labs reviewed: Low Na GFR: 28  Diet Order:  Diet full liquid Room service appropriate? Yes; Fluid consistency: Thin  Skin:  Reviewed, no issues  Last BM:  PTA  Height:   Ht Readings from Last 1 Encounters:  03/26/17 5' 10.5" (1.791 m)    Weight:   Wt Readings from Last 1 Encounters:  03/26/17 158 lb (71.7 kg)    Ideal Body Weight:  78.2 kg  BMI:  Body mass index is 22.35 kg/m.  Estimated Nutritional Needs:   Kcal:  2150-2350  Protein:  105-115g  Fluid:  2.1L/day  EDUCATION NEEDS:   No  education needs identified at this time  Clayton Bibles, MS, RD, LDN Pager: [phone] After Hours Pager: [phone]

## 2017-03-28 NOTE — Progress Notes (Signed)
Wasted 109mL dilauded PCA in sink, witnessed by [employer], Therapist, sports.

## 2017-03-28 NOTE — Progress Notes (Signed)
Patient started complaining about stomach pain to nausea and finally vomited food particles and brown liquid emesis couple of times. Medicated with Zofran x 2 per prn order.

## 2017-03-28 NOTE — Progress Notes (Signed)
2 Days Post-Op Subjective: Patient had nausea and vomiting last night. His nurse indicated this was primarily food and not particularly bilious. He reports his heartburn has responded to proton pump inhibitor therapy. No report of any new pain.  Objective: Vital signs in last 24 hours: Temp:  [98.1 F (36.7 C)-99.3 F (37.4 C)] 99.3 F (37.4 C) (07/15 0534) Pulse Rate:  [51-61] 52 (07/15 0534) Resp:  [15-21] 20 (07/15 0534) BP: (123-155)/(65-93) 124/65 (07/15 0534) SpO2:  [94 %-99 %] 95 % (07/15 0534)  Intake/Output from previous day: 07/14 0701 - 07/15 0700 In: 2663.3 [P.O.:240; I.V.:2423.3] Out: 2297.5 [Urine:2250; Drains:47.5] Intake/Output this shift: No intake/output data recorded.  Physical Exam:  General:alert, cooperative and no distress GI: Abdomen is soft and flat without distention. His stoma remained pink and viable with stent in place draining clear urine. His abdominal incision is healing with no sign of infection.  Lab Results:  Recent Labs  03/26/17 1136 03/27/17 0532  HGB 11.6* 10.7*  HCT 34.7* 32.2*   BMET  Recent Labs  03/27/17 0532  NA 134*  K 5.0  CL 105  CO2 22  GLUCOSE 137*  BUN 37*  CREATININE 2.35*  CALCIUM 7.9*   No results for input(s): LABPT, INR in the last 72 hours. No results for input(s): LABURIN in the last 72 hours. Results for orders placed or performed during the hospital encounter of 02/13/17  Urine culture     Status: Abnormal   Collection Time: 02/13/17 11:44 AM  Result Value Ref Range Status   Specimen Description URINE, CATHETERIZED  Final   Special Requests Normal  Final   Culture MULTIPLE SPECIES PRESENT, SUGGEST RECOLLECTION (A)  Final   Report Status 02/14/2017 FINAL  Final    Studies/Results: No results found.  Assessment/Plan: 2 Days Post-Op Procedure(s) (LRB): OPEN URETERAL REIMPLANT TO CONDUIT (Left)   He tolerated his clear leg was but did not tolerate advancement of his diet. I will therefore backed  this down to full liquids. His drain output continues to remain low at 48 mL's last shift. He is not having any signs of alcohol withdrawal/DTs. He is not ready for discharge at this time.  1. Change diet back to full liquids. 2. Will recheck labs in a.m.   LOS: 2 days   Jeslyn Amsler C 03/28/2017, 8:56 AM

## 2017-03-29 LAB — BASIC METABOLIC PANEL
Anion gap: 5 (ref 5–15)
BUN: 29 mg/dL — ABNORMAL HIGH (ref 6–20)
CO2: 21 mmol/L — ABNORMAL LOW (ref 22–32)
Calcium: 7.9 mg/dL — ABNORMAL LOW (ref 8.9–10.3)
Chloride: 108 mmol/L (ref 101–111)
Creatinine, Ser: 2.25 mg/dL — ABNORMAL HIGH (ref 0.61–1.24)
GFR calc Af Amer: 34 mL/min — ABNORMAL LOW (ref >60)
GFR calc non Af Amer: 29 mL/min — ABNORMAL LOW (ref >60)
Glucose, Bld: 112 mg/dL — ABNORMAL HIGH (ref 65–99)
Potassium: 3.7 mmol/L (ref 3.5–5.1)
Sodium: 134 mmol/L — ABNORMAL LOW (ref 135–145)

## 2017-03-29 LAB — CBC
HCT: 29.7 % — ABNORMAL LOW (ref 39.0–52.0)
Hemoglobin: 10 g/dL — ABNORMAL LOW (ref 13.0–17.0)
MCH: 28.6 pg (ref 26.0–34.0)
MCHC: 33.7 g/dL (ref 30.0–36.0)
MCV: 84.9 fL (ref 78.0–100.0)
Platelets: 189 10*3/uL (ref 150–400)
RBC: 3.5 MIL/uL — ABNORMAL LOW (ref 4.22–5.81)
RDW: 16.4 % — ABNORMAL HIGH (ref 11.5–15.5)
WBC: 15.2 10*3/uL — ABNORMAL HIGH (ref 4.0–10.5)

## 2017-03-29 LAB — CREATININE, FLUID (PLEURAL, PERITONEAL, JP DRAINAGE): Creat, Fluid: 2 mg/dL

## 2017-03-29 MED ORDER — HYDROMORPHONE HCL-NACL 0.5-0.9 MG/ML-% IV SOSY
0.5000 mg | PREFILLED_SYRINGE | INTRAVENOUS | Status: DC | PRN
  Administered 2017-03-29 – 2017-03-30 (×2): 0.5 mg via INTRAVENOUS
  Filled 2017-03-29 (×2): qty 1

## 2017-03-29 MED ORDER — OXYCODONE HCL 5 MG PO TABS
10.0000 mg | ORAL_TABLET | ORAL | Status: DC | PRN
  Administered 2017-03-29 – 2017-03-30 (×5): 10 mg via ORAL
  Filled 2017-03-29 (×5): qty 2

## 2017-03-29 MED ORDER — ACETAMINOPHEN 10 MG/ML IV SOLN
1000.0000 mg | Freq: Four times a day (QID) | INTRAVENOUS | Status: AC
  Administered 2017-03-29 (×4): 1000 mg via INTRAVENOUS
  Filled 2017-03-29 (×5): qty 100

## 2017-03-29 NOTE — Progress Notes (Signed)
7mg  of PCA dilaudid wasted with Raynelle Jan, RN.  Wasted in sink.

## 2017-03-29 NOTE — Care Management Note (Signed)
Case Management Note  Patient Details  Name: Darren Carr MRN: [id number] Date of Birth: 02/09/1951  Subjective/Objective: 66 y/o m admitted w/Bladder Ca. From home.PT cons-await recc.                   Action/Plan:d/c plan home.   Expected Discharge Date:                  Expected Discharge Plan:  Home/Self Care  In-House Referral:     Discharge planning Services  CM Consult  Post Acute Care Choice:    Choice offered to:     DME Arranged:    DME Agency:     HH Arranged:    HH Agency:     Status of Service:  In process, will continue to follow  If discussed at Long Length of Stay Meetings, dates discussed:    Additional Comments:  Dessa Phi, RN 03/29/2017, 2:52 PM

## 2017-03-29 NOTE — Evaluation (Addendum)
Physical Therapy Evaluation Patient Details Name: Darren Carr MRN: [id number] DOB: 1950/11/20 Today's Date: 03/29/2017   History of Present Illness  66 y.o. male with PMH of bladder ca with mets, chronic back pain, s/p radical cystoprostatectomy admitted for ureteral reimplant to conduit 2* stricture on 03/26/17.   Clinical Impression  Pt is independent with mobility, he ambulated 400' independently, no loss of balance. Encouraged pt to ambulated in halls at least TID.  From PT standpoint, he is ready to DC home, no further PT needed. PT signing off.     Follow Up Recommendations No PT follow up    Equipment Recommendations  None recommended by PT    Recommendations for Other Services       Precautions / Restrictions Precautions Precautions: None Restrictions Weight Bearing Restrictions: No      Mobility  Bed Mobility Overal bed mobility: Independent                Transfers Overall transfer level: Independent                  Ambulation/Gait Ambulation/Gait assistance: Independent Ambulation Distance (Feet): 400 Feet Assistive device: None Gait Pattern/deviations: WFL(Within Functional Limits)   Gait velocity interpretation: at or above normal speed for age/gender General Gait Details: steady, no LOB  Stairs            Wheelchair Mobility    Modified Rankin (Stroke Patients Only)       Balance Overall balance assessment: Independent                                           Pertinent Vitals/Pain Pain Assessment: 0-10 Pain Score: 7  Pain Location: lower abdomen Pain Descriptors / Indicators: Sore Pain Intervention(s): Limited activity within patient's tolerance;Monitored during session;Premedicated before session    Home Living Family/patient expects to be discharged to:: Private residence Living Arrangements: Spouse/significant other;Children Available Help at Discharge: Available 24 hours/day Type of Home:  House Home Access: Stairs to enter   [employer] of Steps: 2 Home Layout: One level;Laundry or work area in [place]: Environmental consultant - 2 wheels;Bedside commode      Prior Function Level of Independence: Independent         Comments: 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[employer], now on disability     Journalist, newspaper        Extremity/Trunk Assessment   Upper Extremity Assessment Upper Extremity Assessment: Overall WFL for tasks assessed    Lower Extremity Assessment Lower Extremity Assessment: Overall WFL for tasks assessed    Cervical / Trunk Assessment Cervical / Trunk Assessment: Normal  Communication   Communication: No difficulties  Cognition Arousal/Alertness: Awake/alert Behavior During Therapy: WFL for tasks assessed/performed Overall Cognitive Status: Within Functional Limits for tasks assessed                                        General Comments      Exercises     Assessment/Plan    PT Assessment Patent does not need any further PT services  PT Problem List         PT Treatment Interventions      PT Goals (Current goals can be found in the Care Plan section)  Acute Rehab PT Goals Patient  Stated Goal: likes to be outside, work on cars PT Goal Formulation: All assessment and education complete, DC therapy    Frequency     Barriers to discharge        Co-evaluation               AM-PAC PT "6 Clicks" Daily Activity  Outcome Measure Difficulty turning over in bed (including adjusting bedclothes, sheets and blankets)?: None Difficulty moving from lying on back to sitting on the side of the bed? : None Difficulty sitting down on and standing up from a chair with arms (e.g., wheelchair, bedside commode, etc,.)?: None Help needed moving to and from a bed to chair (including a wheelchair)?: None Help needed walking in hospital room?: None Help needed climbing 3-5 steps with a railing? : None 6 Click  Score: 24    End of Session   Activity Tolerance: Patient tolerated treatment well Patient left: in chair;with call bell/phone within reach;with family/visitor present Nurse Communication: Mobility status      Time: 1449-1458 PT Time Calculation (min) (ACUTE ONLY): 9 min   Charges:   PT Evaluation $PT Eval Low Complexity: 1 Procedure     PT G CodesPhilomena Doheny 03/29/2017, 3:06 PM [phone]

## 2017-03-29 NOTE — Progress Notes (Addendum)
3 Days Post-Op Subjective: No new pain. Resolution of nausea overnight. Not OOB yesterday. Still using dilaudid PCA quite a bit. Reports belching this AM.   Objective: Vital signs in last 24 hours: Temp:  [98.2 F (36.8 C)-98.9 F (37.2 C)] 98.4 F (36.9 C) (07/16 0532) Pulse Rate:  [53-69] 53 (07/16 0532) Resp:  [14-19] 17 (07/16 0532) BP: (116-135)/(75-80) 135/78 (07/16 0532) SpO2:  [92 %-98 %] 97 % (07/16 0532)  Intake/Output from previous day: 07/15 0701 - 07/16 0700 In: 2432.9 [P.O.:120; I.V.:2312.9] Out: 2020 [OZDGU:4403; Drains:80] Intake/Output this shift: No intake/output data recorded.  Physical Exam:  General:alert, cooperative and no distress GI: Abdomen is slightly distended this AM. His stoma remained pink and viable with stent in place draining clear urine. His abdominal incision is healing with no sign of infection. JP drain with serosanguinous output.   Lab Results:  Recent Labs  03/26/17 1136 03/27/17 0532 03/29/17 0451  HGB 11.6* 10.7* 10.0*  HCT 34.7* 32.2* 29.7*   BMET  Recent Labs  03/27/17 0532 03/29/17 0451  NA 134* 134*  K 5.0 3.7  CL 105 108  CO2 22 21*  GLUCOSE 137* 112*  BUN 37* 29*  CREATININE 2.35* 2.25*  CALCIUM 7.9* 7.9*   No results for input(s): LABPT, INR in the last 72 hours. No results for input(s): LABURIN in the last 72 hours. Results for orders placed or performed during the hospital encounter of 02/13/17  Urine culture     Status: Abnormal   Collection Time: 02/13/17 11:44 AM  Result Value Ref Range Status   Specimen Description URINE, CATHETERIZED  Final   Special Requests Normal  Final   Culture MULTIPLE SPECIES PRESENT, SUGGEST RECOLLECTION (A)  Final   Report Status 02/14/2017 FINAL  Final    Studies/Results: No results found.  Assessment/Plan: 3 Days Post-Op Procedure(s) (LRB): OPEN URETERAL REIMPLANT TO CONDUIT (Left)   He tolerated his clear diet was but did not tolerate advancement of his diet over  weekend with vomiting. Keep on full liquids today until ROBF. His drain output continues to remain low at 40 mL's last shift, but is not decreasing. Will check for urine leak today.Marland Kitchen He is not having any signs of alcohol withdrawal/DTs.   1. Continue full liquids, IVF, PCA 2. PT eval today to assist with mobilization 3. Will send JP cr from drain   4. Add scheduled tylenol for pain control   LOS: 3 days   FILIPPOU, PAULINE L 03/29/2017, 7:15 AM  I have seen and examined the patient and agree with Dr. Samuel Jester assesment and plan.. Briefly,  S: 1 - Left Ureteral Stricture - s/p open left ureteral reimplant 03/26/17, the day of admission for distal stricure.  2 - Post-op Ileus - pt on clears initially advaced to fulls at [facility]. Some new nausea / emesis 7/15 and mild abd distension c/w ileus. JP output remians minimal / non-foul. He is not been compliant with ambulation.   Today "Ulice Dash" mostly c/o some nausea with emesis x few yesterday. Minimal flatus. He has only ambulated once in past 3 days.   O: NAD, AOx3 RRR Non-labored breathign on minimal [place] O2 Minimal abd distension w/o reboung or guarding.  RLQ Urostomy pink / patent JP with scan serous output SCD's in place.  A/P Ambulate, PT eval, continue current diet. Importance of ambulation / minimizing narcotics discussed with pt.

## 2017-03-29 NOTE — Op Note (Signed)
NAME:  Darren Carr, Darren Carr                      ACCOUNT NO.:  MEDICAL RECORD NO.:  [id number]  LOCATION:                                 FACILITY:  PHYSICIAN:  [name], MD     DATE OF BIRTH:  1951/01/12  DATE OF PROCEDURE: 03/26/2017                              OPERATIVE REPORT   DIAGNOSES:  Left ureteral stricture, history of metastatic bladder cancer.  PROCEDURE:  Open left ureteral reimplant into conduit.  ESTIMATED BLOOD LOSS:  50 mL.  COMPLICATION:  None.  SPECIMEN:  None.  FINDINGS: 1. Mild intraabdominal adhesions as anticipated. 2. Successful reimplantation of left distal ureter into proximal end     of conduit. 3. Placement of left-sided Bander stent to 25 cm to the anastomosis.  ASSISTANT:  Debbrah Alar, PA.  DRAINS: 1. Jackson-Pratt drain to bulb suction. 2. Right lower quadrant urostomy to gravity drainage with distal end     of Bander stent in situ.  INDICATION:  Darren Carr is a very pleasant 66 year old gentleman with history of metastatic bladder cancer, status post cystoprostatectomy approximately 2-1/2 years ago.  He unfortunately has had problems with chronic left ureteral stricture that appears to be at the area of his ureteroenteric anastomosis based on multiple imaging studies including antegrade contrast studies.  He has been tube dependent with nephrostomy tube and nephroureteral catheter for over a year.  His ultimate goal is to become tube and stent free.  We discussed options including ureteral reimplantation versus chronic tubes and stents, and he wished to proceed with attempt of surgery.  We both agreed to verify cancer free at 2 years before proceeding with this, he achieve this goal and now presents for left ureteral reimplant.  Informed consent was obtained and placed in the medical record.  PROCEDURE IN DETAIL:  The patient being [facility], was verified. Procedure being left open ureteral reimplantation was confirmed. Procedure was  carried out.  Time-out was performed.  Intravenous antibiotics were administered.  General endotracheal anesthesia was introduced.  The patient was placed into a supine position, and sterile field was created by prepping and draping the patient's infra-xiphoid abdomen and its in-situ conduit, which had an externalized nephroureteral stent emanating from it.  Next, incision was made approximately 10 cm in length in the midline beginning approximately 4 cm above the umbilicus and extending inferiorly on the left side of the umbilicus.  Dissection was carried out very carefully to the fascia and perineum, which very carefully opened sharply.  The remainder of the incision then was opened using surgeon's fingers, a backstop. Inspection of the abdominal cavity revealed quite minimal omental adhesions, no obvious large bowel-bowel adhesions, was quite favorable. An Omnitract-type retractor was then deployed, bowel was packed out of the way.  The conduit was traced proximally to the area of the ureteroileal anastomosis.  The right ureteroenteric anastomosis was immediately identified as verified by nonpalpable stent within it, appeared at this point that the left ureteroenteric anastomosis was posterior to this.  Very careful dissection was performed freeing up the antimesenteric border of the conduit in the area of presumed left ureteral anastomosis, and left ureter was indeed  encountered, it was very carefully circumferentially mobilized, marked with vessel loop and mobilized as it tunneled underneath the mesentery for distance approximately 3 cm in total, it appeared to be suitably vascular.  It was then coldly transected at the level of the ureteroenteric anastomosis.  The in-situ nephroureteral stent was then purposely cut, was removed in its entirety, inspected and discarded.  The area of bowel was oversewn using figure-of-eight Vicryl x2 and the ureter was very carefully mobilized  proximally again circumferentially for distance approximately 3 cm and appeared to be suitably vascular and suitably patent, and somewhat dilated as expected to be proximal to the area of known stricture.  A suitable portion for enteric anastomosis was identified at the proximal end of the conduit, thus appeared to be sufficiently mobile vascular, and baskets were used to remove approximately 4 mm area of bowel.  Four mucosal everting sutures were placed, the ureter was spatulated for distance approximately 1 cm.  A heel stitch of 4-0 Vicryl was applied.  A blue-colored Bander stent was then advanced retrograde through the conduit through the new anastomotic site to distance approximately 25 cm to the new anastomosis.  Next, two running suture lines of 4-0 Vicryl were used from the heel outwards resulting in excellent mucosa-to-mucosa apposition of the ureter and proximal bowel segment thus performing ureteral reimplantation.  There was no undue tension.  A 4-0 chromic suture was used through and through the area of Bander stent, anchored in place via the conduit.  Retractors were taken down.  All sponge and needle counts were correct.  Hemostasis appeared excellent.  A small area of omentum was mobilized and placed directly on top to the area of anastomoses to further protect this.  A small counterincision was made in the left lower quadrant, through which, a Jackson-Pratt drain was brought through into the area of peritoneal cavity.  The fascia was closed using figure-of-eight PDS times approximately 12.  Scarpa was reapproximated with running Vicryl. All incision sites were infiltrated with dilute lyophilized Marcaine and closed at the level of the skin using subcuticular Monocryl followed by Dermabond.  A new urostomy appliance was placed and procedure terminated.  The patient tolerated the procedure well.  There were no immediate periprocedural complications.  The patient was taken to  the postanesthesia care unit in stable condition.  Please note surgical assistant, Debbrah Alar was crucial for all portions of the procedure today.  She provided invaluable retraction, suture passage, suctioning and assisting, without which this would not be possible.          ______________________________ [name], MD     TM/MEDQ  D:  03/26/2017  T:  03/27/2017  Job:  [id number]

## 2017-03-30 MED ORDER — SENNOSIDES-DOCUSATE SODIUM 8.6-50 MG PO TABS: 2.0000 | ORAL_TABLET | Freq: Every day | ORAL | 0 refills | Status: DC

## 2017-03-30 MED ORDER — DOCUSATE SODIUM 100 MG PO CAPS: 100.0000 mg | ORAL_CAPSULE | Freq: Two times a day (BID) | ORAL | 0 refills | Status: AC

## 2017-03-30 MED ORDER — ACETAMINOPHEN 325 MG PO TABS
650.0000 mg | ORAL_TABLET | Freq: Four times a day (QID) | ORAL | Status: DC
  Administered 2017-03-30: 650 mg via ORAL
  Filled 2017-03-30: qty 2

## 2017-03-30 NOTE — Discharge Summary (Signed)
[facility] Discharge Summary  Admit date: 03/26/2017  Discharge date and time: 03/30/17   Discharge to: Home  Discharge Service: Urology  Discharge Attending Physician:  Dr. [name]  Discharge  Diagnoses: Left ureteroenteric stenosis  Secondary Diagnosis: Active Problems:   Bladder cancer [facility])   OR Procedures: Procedure(s): OPEN URETERAL REIMPLANT TO CONDUIT 03/26/2017   Ancillary Procedures: None   Discharge Day Services: The patient was seen and examined by the Urology team both in the morning and immediately prior to discharge.  Vital signs and laboratory values were stable and within normal limits.  The physical exam was benign and unchanged and all surgical wounds were examined.  Discharge instructions were explained and all questions answered.  Subjective  No acute events overnight. Pain Controlled. No fever or chills.  Objective Patient Vitals for the past 8 hrs:  BP Temp Temp src Pulse Resp SpO2  03/30/17 0629 127/84 98.4 F (36.9 C) Oral 62 18 98 %   No intake/output data recorded.  General Appearance:        No acute distress Lungs:                 Normal work of breathing on room air Heart:                             Regular rate and rhythm Abdomen:                       Soft, non-tender, non-distended. Incisions clean dry and intact, dermabond in place. JP drain site dressed. Ileal conduit in RLQ with single bander stent emanating in bag, clear yellow urine in bag. Marland Kitchen  Extremities:                  Warm and well perfused    Hospital Course:  The patient underwent left open ureteral reimplant into conduit on 03/26/2017.  The patient tolerated the procedure well, was extubated in the OR, and afterwards was taken to the PACU for routine post-surgical care. When stable the patient was transferred to the floor.   The patient did well postoperatively.  The patient's diet was slowly advanced and at the time of discharge was tolerating a regular diet.   This was complicated by mild ileus with distention and nausea/vomiting, but this resolved when the patient passed gas on POD3. JP fluid was checked for creatinine prior to drain removal, which resulted as negative for urine leak. JP drain was removed on POD4 without complication.   PT saw patient for evaluation on POD3, and determined no post operative formal PT needs. The patient was discharged home 4 Days Post-Op, at which point was tolerating a regular solid diet,  have adequate pain control with P.O. pain medication, and could ambulate without difficulty. He was comfortable with his ostomy care. The patient will follow up with [place] for post op check.   Condition at Discharge: Improved  Discharge Medications:  Allergies as of 03/30/2017   No Known Allergies     Medication List    STOP taking these medications   aspirin 81 MG EC tablet     TAKE these medications   atorvastatin 40 MG tablet Commonly known as:  LIPITOR Take 1 tablet (40 mg total) by mouth daily at 6 PM.   carvedilol 3.125 MG tablet Commonly known as:  COREG Take 1 tablet (3.125 mg total) by mouth 2 (two) times daily with  a meal.   docusate sodium 100 MG capsule Commonly known as:  COLACE Take 1 capsule (100 mg total) by mouth 2 (two) times daily.   ELIQUIS 2.5 MG Tabs tablet Generic drug:  apixaban Take 2.5 mg by mouth 2 (two) times daily.   gabapentin 100 MG capsule Commonly known as:  NEURONTIN Take 100 mg by mouth 3 (three) times daily as needed for pain.   nitroGLYCERIN 0.4 MG SL tablet Commonly known as:  NITROSTAT Place 1 tablet (0.4 mg total) under the tongue every 5 (five) minutes x 3 doses as needed for chest pain.   Oxycodone HCl 10 MG Tabs Take 1 tablet (10 mg total) by mouth every 6 (six) hours as needed (pain). What changed:  when to take this   pantoprazole 40 MG tablet Commonly known as:  PROTONIX Take 1 tablet (40 mg total) by mouth 2 (two) times daily.   senna-docusate 8.6-50 MG  tablet Commonly known as:  Senokot-S Take 2 tablets by mouth at bedtime.       Pending Test Results: None  Discharge Instructions:   Discharge Instructions    Call MD for:  persistant nausea and vomiting    Complete by:  As directed    Call MD for:  redness, tenderness, or signs of infection (pain, swelling, redness, odor or green/yellow discharge around incision site)    Complete by:  As directed    Call MD for:  severe uncontrolled pain    Complete by:  As directed    Call MD for:  temperature >100.4    Complete by:  As directed    Diet - low sodium heart healthy    Complete by:  As directed    Discharge instructions    Complete by:  As directed    Activity:  You are encouraged to ambulate frequently (about every hour during waking hours) to help prevent blood clots from forming in your legs or lungs.  However, you should not engage in any heavy lifting (> 10-15 lbs), strenuous activity, or straining. Diet: You should advance your diet as instructed by your physician.  It will be normal to have some bloating, nausea, and abdominal discomfort intermittently. Prescriptions:  You will be provided a prescription for pain medication to take as needed.  If your pain is not severe enough to require the prescription pain medication, you may take extra strength Tylenol instead which will have less side effects.  You should also take a prescribed stool softener to avoid straining with bowel movements as the prescription pain medication may constipate you. Incisions: You may remove your dressing bandages 48 hours after surgery if not removed in the hospital.  You will either have some small staples or special tissue glue at each of the incision sites. Once the bandages are removed (if present), the incisions may stay open to air.  You may start showering (but not soaking or bathing in water) the 2nd day after surgery and the incisions simply need to be patted dry after the shower.  No additional  care is needed. What to call us about: You should call the office [phone]) if you develop fever > 101 or develop persistent vomiting.   Increase activity slowly    Complete by:  As directed    No heavy lifting greater than 10 pounds for 4-6 weeks.   Other Restrictions    Complete by:  As directed    Shower ok. No bath or soaking incisions until healed.  I have seen and examined the patient and agree with discharge as per above.    Briefly,  He underwent open left ureteral reimplant of strictured distal ureter into ileal conduit on day of admission. After several days of mild ileus he is meeting DC criteria as tollerating PO diet, ambulating, and pain controlled on PO meds. JP removed prior to DC as output scant and Cr same as serum.  NAD, AOx3, wife at bedsdie RRR SNTND, midline incision w/o hernias or drainage. Prior JP site c/d/i. RLQ Urostomy pink / patent with distal bander stent. NO LE edema.

## 2017-03-30 NOTE — Care Management Note (Signed)
Case Management Note  Patient Details  Name: Darren Carr MRN: [id number] Date of Birth: October 24, 1950  Subjective/Objective:                    Action/Plan:d/c home.   Expected Discharge Date:  03/30/17               Expected Discharge Plan:  Home/Self Care  In-House Referral:     Discharge planning Services  CM Consult  Post Acute Care Choice:    Choice offered to:     DME Arranged:    DME Agency:     HH Arranged:    HH Agency:     Status of Service:  Completed, signed off  If discussed at [employer] of Stay Meetings, dates discussed:    Additional Comments:  Dessa Phi, RN 03/30/2017, 1:11 PM

## 2017-03-30 NOTE — Progress Notes (Signed)
Went over paperwork with patient and family.  All questions answered.  VSS.  Prescriptions and discharge paperwork given.  Pt requested to walk out to car with wife.

## 2017-05-30 ENCOUNTER — Emergency Department (HOSPITAL_COMMUNITY): Payer: BLUE CROSS/BLUE SHIELD

## 2017-05-30 ENCOUNTER — Encounter (HOSPITAL_COMMUNITY): Payer: Self-pay | Admitting: Emergency Medicine

## 2017-05-30 ENCOUNTER — Inpatient Hospital Stay (HOSPITAL_COMMUNITY)
Admission: EM | Admit: 2017-05-30 | Discharge: 2017-06-02 | DRG: 281 | Disposition: A | Discharge status: Home / Self Care | Payer: BLUE CROSS/BLUE SHIELD | Attending: Cardiology | Admitting: Cardiology

## 2017-05-30 DIAGNOSIS — F17210 Nicotine dependence, cigarettes, uncomplicated: Secondary | ICD-10-CM | POA: Diagnosis present

## 2017-05-30 DIAGNOSIS — I200 Unstable angina: Secondary | ICD-10-CM

## 2017-05-30 DIAGNOSIS — N179 Acute kidney failure, unspecified: Secondary | ICD-10-CM | POA: Diagnosis present

## 2017-05-30 DIAGNOSIS — K219 Gastro-esophageal reflux disease without esophagitis: Secondary | ICD-10-CM | POA: Diagnosis present

## 2017-05-30 DIAGNOSIS — M549 Dorsalgia, unspecified: Secondary | ICD-10-CM | POA: Diagnosis present

## 2017-05-30 DIAGNOSIS — Z7982 Long term (current) use of aspirin: Secondary | ICD-10-CM

## 2017-05-30 DIAGNOSIS — R7989 Other specified abnormal findings of blood chemistry: Secondary | ICD-10-CM | POA: Diagnosis present

## 2017-05-30 DIAGNOSIS — Z8551 Personal history of malignant neoplasm of bladder: Secondary | ICD-10-CM | POA: Diagnosis not present

## 2017-05-30 DIAGNOSIS — G8929 Other chronic pain: Secondary | ICD-10-CM | POA: Diagnosis present

## 2017-05-30 DIAGNOSIS — I480 Paroxysmal atrial fibrillation: Secondary | ICD-10-CM | POA: Diagnosis present

## 2017-05-30 DIAGNOSIS — N390 Urinary tract infection, site not specified: Secondary | ICD-10-CM | POA: Diagnosis present

## 2017-05-30 DIAGNOSIS — N184 Chronic kidney disease, stage 4 (severe): Secondary | ICD-10-CM | POA: Diagnosis present

## 2017-05-30 DIAGNOSIS — F1010 Alcohol abuse, uncomplicated: Secondary | ICD-10-CM | POA: Diagnosis present

## 2017-05-30 DIAGNOSIS — E785 Hyperlipidemia, unspecified: Secondary | ICD-10-CM | POA: Diagnosis present

## 2017-05-30 DIAGNOSIS — R079 Chest pain, unspecified: Secondary | ICD-10-CM | POA: Diagnosis present

## 2017-05-30 DIAGNOSIS — I2510 Atherosclerotic heart disease of native coronary artery without angina pectoris: Secondary | ICD-10-CM | POA: Diagnosis present

## 2017-05-30 DIAGNOSIS — I249 Acute ischemic heart disease, unspecified: Secondary | ICD-10-CM | POA: Diagnosis present

## 2017-05-30 DIAGNOSIS — Z906 Acquired absence of other parts of urinary tract: Secondary | ICD-10-CM | POA: Diagnosis not present

## 2017-05-30 DIAGNOSIS — Z79899 Other long term (current) drug therapy: Secondary | ICD-10-CM | POA: Diagnosis not present

## 2017-05-30 DIAGNOSIS — I129 Hypertensive chronic kidney disease with stage 1 through stage 4 chronic kidney disease, or unspecified chronic kidney disease: Secondary | ICD-10-CM | POA: Diagnosis present

## 2017-05-30 DIAGNOSIS — I214 Non-ST elevation (NSTEMI) myocardial infarction: Secondary | ICD-10-CM | POA: Diagnosis present

## 2017-05-30 DIAGNOSIS — I252 Old myocardial infarction: Secondary | ICD-10-CM | POA: Diagnosis not present

## 2017-05-30 DIAGNOSIS — Z9079 Acquired absence of other genital organ(s): Secondary | ICD-10-CM | POA: Diagnosis not present

## 2017-05-30 LAB — CBC
HEMATOCRIT: 39.7 % (ref 39.0–52.0)
HEMOGLOBIN: 12.9 g/dL — AB (ref 13.0–17.0)
MCH: 28.6 pg (ref 26.0–34.0)
MCHC: 32.5 g/dL (ref 30.0–36.0)
MCV: 88 fL (ref 78.0–100.0)
PLATELETS: 339 10*3/uL (ref 150–400)
RBC: 4.51 MIL/uL (ref 4.22–5.81)
RDW: 16.8 % — AB (ref 11.5–15.5)
WBC: 18.1 10*3/uL — AB (ref 4.0–10.5)

## 2017-05-30 LAB — BASIC METABOLIC PANEL
Anion gap: 5 (ref 5–15)
BUN: 91 mg/dL — AB (ref 6–20)
CHLORIDE: 123 mmol/L — AB (ref 101–111)
CO2: 11 mmol/L — ABNORMAL LOW (ref 22–32)
Calcium: 8.4 mg/dL — ABNORMAL LOW (ref 8.9–10.3)
Creatinine, Ser: 3.01 mg/dL — ABNORMAL HIGH (ref 0.61–1.24)
GFR calc Af Amer: 24 mL/min — ABNORMAL LOW (ref >60)
GFR, EST NON AFRICAN AMERICAN: 20 mL/min — AB (ref >60)
Glucose, Bld: 112 mg/dL — ABNORMAL HIGH (ref 65–99)
Potassium: 4.5 mmol/L (ref 3.5–5.1)
SODIUM: 139 mmol/L (ref 135–145)

## 2017-05-30 LAB — I-STAT TROPONIN, ED
Troponin i, poc: 0.05 ng/mL (ref 0.00–0.08)
Troponin i, poc: 0.06 ng/mL (ref 0.00–0.08)

## 2017-05-30 LAB — PROTIME-INR
INR: 0.96
PROTHROMBIN TIME: 12.7 s (ref 11.4–15.2)

## 2017-05-30 MED ORDER — NITROGLYCERIN IN D5W 200-5 MCG/ML-% IV SOLN
0.0000 ug/min | Freq: Once | INTRAVENOUS | Status: AC
  Administered 2017-05-30: 5 ug/min via INTRAVENOUS
  Filled 2017-05-30: qty 250

## 2017-05-30 MED ORDER — ASPIRIN 300 MG RE SUPP: 300.0000 mg | RECTAL | Status: DC

## 2017-05-30 MED ORDER — NITROGLYCERIN IN D5W 200-5 MCG/ML-% IV SOLN
5.0000 ug/min | INTRAVENOUS | Status: DC
  Administered 2017-05-31: 5 ug/min via INTRAVENOUS

## 2017-05-30 MED ORDER — CARVEDILOL 3.125 MG PO TABS
3.1250 mg | ORAL_TABLET | Freq: Two times a day (BID) | ORAL | Status: DC
  Administered 2017-05-31 – 2017-06-02 (×5): 3.125 mg via ORAL
  Filled 2017-05-30 (×7): qty 1

## 2017-05-30 MED ORDER — SODIUM CHLORIDE 0.9 % IV SOLN
INTRAVENOUS | Status: DC
  Administered 2017-05-31 – 2017-06-02 (×3): via INTRAVENOUS

## 2017-05-30 MED ORDER — ASPIRIN 81 MG PO CHEW: 324.0000 mg | CHEWABLE_TABLET | ORAL | Status: DC

## 2017-05-30 MED ORDER — DEXTROSE 5 % IV SOLN
1.0000 g | Freq: Every day | INTRAVENOUS | Status: DC
  Administered 2017-05-31 – 2017-06-01 (×3): 1 g via INTRAVENOUS
  Filled 2017-05-30 (×4): qty 10

## 2017-05-30 MED ORDER — HEPARIN BOLUS VIA INFUSION
4000.0000 [IU] | Freq: Once | INTRAVENOUS | Status: AC
  Administered 2017-05-30: 4000 [IU] via INTRAVENOUS
  Filled 2017-05-30: qty 4000

## 2017-05-30 MED ORDER — ASPIRIN EC 81 MG PO TBEC
81.0000 mg | DELAYED_RELEASE_TABLET | Freq: Every day | ORAL | Status: DC
  Administered 2017-05-31 – 2017-06-02 (×3): 81 mg via ORAL
  Filled 2017-05-30 (×3): qty 1

## 2017-05-30 MED ORDER — NITROGLYCERIN 0.4 MG SL SUBL: 0.4000 mg | SUBLINGUAL_TABLET | SUBLINGUAL | Status: DC | PRN

## 2017-05-30 MED ORDER — CLOPIDOGREL BISULFATE 75 MG PO TABS
75.0000 mg | ORAL_TABLET | Freq: Every day | ORAL | Status: DC
  Administered 2017-05-31 – 2017-06-02 (×3): 75 mg via ORAL
  Filled 2017-05-30 (×3): qty 1

## 2017-05-30 MED ORDER — CLOPIDOGREL BISULFATE 300 MG PO TABS
300.0000 mg | ORAL_TABLET | Freq: Once | ORAL | Status: AC
  Administered 2017-05-31: 300 mg via ORAL
  Filled 2017-05-30: qty 1

## 2017-05-30 MED ORDER — AMIODARONE HCL 200 MG PO TABS
200.0000 mg | ORAL_TABLET | Freq: Every day | ORAL | Status: DC
  Administered 2017-05-31 – 2017-06-02 (×3): 200 mg via ORAL
  Filled 2017-05-30 (×3): qty 1

## 2017-05-30 MED ORDER — ASPIRIN 81 MG PO CHEW
324.0000 mg | CHEWABLE_TABLET | Freq: Once | ORAL | Status: AC
  Administered 2017-05-30: 324 mg via ORAL
  Filled 2017-05-30: qty 4

## 2017-05-30 MED ORDER — HEPARIN (PORCINE) IN NACL 100-0.45 UNIT/ML-% IJ SOLN
800.0000 [IU]/h | INTRAMUSCULAR | Status: DC
  Administered 2017-05-30: 850 [IU]/h via INTRAVENOUS
  Administered 2017-05-31: 900 [IU]/h via INTRAVENOUS
  Filled 2017-05-30 (×4): qty 250

## 2017-05-30 MED ORDER — ATORVASTATIN CALCIUM 40 MG PO TABS
40.0000 mg | ORAL_TABLET | Freq: Every day | ORAL | Status: DC
Start: 2017-05-31 — End: 2017-06-02
  Administered 2017-05-31 – 2017-06-01 (×2): 40 mg via ORAL
  Filled 2017-05-30 (×3): qty 1

## 2017-05-30 MED ORDER — ONDANSETRON HCL 4 MG/2ML IJ SOLN: 4.0000 mg | Freq: Four times a day (QID) | INTRAMUSCULAR | Status: DC | PRN

## 2017-05-30 MED ORDER — PANTOPRAZOLE SODIUM 40 MG PO TBEC
40.0000 mg | DELAYED_RELEASE_TABLET | Freq: Two times a day (BID) | ORAL | Status: DC
  Administered 2017-05-31 (×2): 40 mg via ORAL
  Filled 2017-05-30 (×2): qty 1

## 2017-05-30 MED ORDER — DOCUSATE SODIUM 100 MG PO CAPS
100.0000 mg | ORAL_CAPSULE | Freq: Two times a day (BID) | ORAL | Status: DC
  Administered 2017-05-31 – 2017-06-01 (×4): 100 mg via ORAL
  Filled 2017-05-30 (×4): qty 1
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
## 2017-05-30 NOTE — ED Triage Notes (Signed)
Pt reports L sided CP X 1 week. Intermittent, radiates into L arm, neck, back. Heavy feeling, currently 4/10. Also reports SOB, dizziness, nausea. States that he takes NTG when the pain comes and it makes the pain better. Pt has significant cardiac hx.
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
## 2017-05-31 ENCOUNTER — Encounter (HOSPITAL_COMMUNITY): Payer: Self-pay | Admitting: General Practice

## 2017-05-31 LAB — URINALYSIS, ROUTINE W REFLEX MICROSCOPIC
Bilirubin Urine: NEGATIVE
GLUCOSE, UA: NEGATIVE mg/dL
Ketones, ur: NEGATIVE mg/dL
NITRITE: NEGATIVE
PH: 8 (ref 5.0–8.0)
Protein, ur: 100 mg/dL — AB
SPECIFIC GRAVITY, URINE: 1.01 (ref 1.005–1.030)
Squamous Epithelial / HPF: NONE SEEN

## 2017-05-31 LAB — LIPID PANEL
CHOL/HDL RATIO: 4.1 ratio
Cholesterol: 143 mg/dL (ref 0–200)
HDL: 35 mg/dL — ABNORMAL LOW (ref >40)
LDL CALC: 86 mg/dL (ref 0–99)
Triglycerides: 112 mg/dL (ref <150)
VLDL: 22 mg/dL (ref 0–40)

## 2017-05-31 LAB — CBC
HEMATOCRIT: 40.6 % (ref 39.0–52.0)
HEMOGLOBIN: 13.1 g/dL (ref 13.0–17.0)
MCH: 28.3 pg (ref 26.0–34.0)
MCHC: 32.3 g/dL (ref 30.0–36.0)
MCV: 87.7 fL (ref 78.0–100.0)
Platelets: 277 10*3/uL (ref 150–400)
RBC: 4.63 MIL/uL (ref 4.22–5.81)
RDW: 16.9 % — ABNORMAL HIGH (ref 11.5–15.5)
WBC: 14.4 10*3/uL — AB (ref 4.0–10.5)

## 2017-05-31 LAB — BASIC METABOLIC PANEL
Anion gap: 8 (ref 5–15)
BUN: 94 mg/dL — AB (ref 6–20)
CO2: 8 mmol/L — AB (ref 22–32)
Calcium: 8.3 mg/dL — ABNORMAL LOW (ref 8.9–10.3)
Chloride: 124 mmol/L — ABNORMAL HIGH (ref 101–111)
Creatinine, Ser: 2.91 mg/dL — ABNORMAL HIGH (ref 0.61–1.24)
GFR calc Af Amer: 25 mL/min — ABNORMAL LOW (ref >60)
GFR, EST NON AFRICAN AMERICAN: 21 mL/min — AB (ref >60)
GLUCOSE: 85 mg/dL (ref 65–99)
POTASSIUM: 4.4 mmol/L (ref 3.5–5.1)
Sodium: 140 mmol/L (ref 135–145)

## 2017-05-31 LAB — I-STAT TROPONIN, ED
TROPONIN I, POC: 0.17 ng/mL — AB (ref 0.00–0.08)
Troponin i, poc: 0.09 ng/mL (ref 0.00–0.08)

## 2017-05-31 LAB — HEPARIN LEVEL (UNFRACTIONATED)
HEPARIN UNFRACTIONATED: 0.35 [IU]/mL (ref 0.30–0.70)
Heparin Unfractionated: 0.31 IU/mL (ref 0.30–0.70)

## 2017-05-31 MED ORDER — OXYCODONE HCL 5 MG PO TABS
5.0000 mg | ORAL_TABLET | Freq: Three times a day (TID) | ORAL | Status: DC | PRN
  Administered 2017-05-31 – 2017-06-02 (×5): 5 mg via ORAL
  Filled 2017-05-31 (×5): qty 1

## 2017-05-31 NOTE — ED Notes (Signed)
Report given to RN Sydell Axon

## 2017-05-31 NOTE — Progress Notes (Signed)
[place] for heparin Indication: chest pain/ACS  No Known Allergies  Patient Measurements: Height: 5\' 11"  (180.3 cm) Weight: 160 lb (72.6 kg) IBW/kg (Calculated) : 75.3 Heparin Dosing Weight: 72.6 kg  Assessment: 4 yom coming in with chest pain with a significant cardiac history. No oral anticoagulants listed prior to admission. H/H and platelets are stable. No signs/symptoms of bleeding or IV line issues per RN.  Heparin level remains therapeutic with confirmatory check  Goal of Therapy:  Heparin level 0.3-0.7 units/ml Monitor platelets by anticoagulation protocol: Yes   Plan: Continue heparin gtt at 900 units/hr  Monitor daily heparin level, CBC, s/s of bleed  Elenor Quinones, PharmD, [facility] Clinical Pharmacist Pager [phone] 05/31/2017 7:33 PM

## 2017-05-31 NOTE — Progress Notes (Signed)
  Pt admitted to the unit. Pt is stable, alert and oriented per baseline. Oriented to room, staff, and call bell. Educated to call for any assistance. Bed in lowest position, call bell within reach- will continue to monitor. 

## 2017-05-31 NOTE — ED Notes (Signed)
C/o chest pain 4/10--  Back pain 7/10 "from chronic back pain-- I take pain meds-- oxycodone 10mg "

## 2017-05-31 NOTE — ED Notes (Signed)
Pt states pain is better-- "but hurts when I move"  Pain started x 1.5 weeks ago-- worse yesterday

## 2017-05-31 NOTE — ED Notes (Signed)
Updated Dr. Terrence Dupont with latest lab results.
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
## 2017-05-31 NOTE — ED Notes (Signed)
Arrived on 3W at 2202 with pt. Pt ambulated to bed without difficulty. Pt hooked up to monitor at this time. Staff aware pt is present on the floor. Pt in NAD, NSR on the monitor. 3W staff made aware this RN is returning the ED, notified the pt is in the room in need of assessment from primary RN.

## 2017-05-31 NOTE — Progress Notes (Signed)
Patient stated that he had money and medication with him. Medication will be counted and sent to pharmacy. Pt stated that he wanted the money to stay in the room with him. Pt educated.

## 2017-05-31 NOTE — Progress Notes (Signed)
Subjective:  Complains of vague left-sided chest pain off and on without associated symptoms states overall feels better. Troponin I have minimally elevated  Objective:  Vital Signs in the last 24 hours: Temp:  [97.7 F (36.5 C)] 97.7 F (36.5 C) (09/16 1736) Pulse Rate:  [62-89] 70 (09/17 1631) Resp:  [10-25] 14 (09/17 1631) BP: (100-130)/(67-103) 116/79 (09/17 1631) SpO2:  [98 %-100 %] 100 % (09/17 1517) Weight:  [72.6 kg (160 lb)] 72.6 kg (160 lb) (09/16 1735)  Intake/Output from previous day: 09/16 0701 - 09/17 0700 In: 108.1 [I.V.:58.1; IV Piggyback:50] Out: -  Intake/Output from this shift: No intake/output data recorded.  Physical Exam: Neck: no adenopathy, no carotid bruit, no JVD and supple, symmetrical, trachea midline Lungs: clear to auscultation bilaterally Heart: regular rate and rhythm, S1, S2 normal and Soft systolic murmur noted Abdomen: soft, non-tender; bowel sounds normal; no masses,  no organomegaly Extremities: extremities normal, atraumatic, no cyanosis or edema  Lab Results:  Recent Labs  05/30/17 1741 05/31/17 0247  WBC 18.1* 14.4*  HGB 12.9* 13.1  PLT 339 277    Recent Labs  05/30/17 1741 05/31/17 0247  NA 139 140  K 4.5 4.4  CL 123* 124*  CO2 11* 8*  GLUCOSE 112* 85  BUN 91* 94*  CREATININE 3.01* 2.91*   No results for input(s): TROPONINI in the last 72 hours.  Invalid input(s): CK, MB Hepatic Function Panel No results for input(s): PROT, ALBUMIN, AST, ALT, ALKPHOS, BILITOT, BILIDIR, IBILI in the last 72 hours.  Recent Labs  05/31/17 0247  CHOL 143   No results for input(s): PROTIME in the last 72 hours.  Imaging: Imaging results have been reviewed and Dg Chest 2 View  Result Date: 05/30/2017 CLINICAL DATA:  Chest pain for 1 week. EXAM: CHEST  2 VIEW COMPARISON:  08/19/2016 FINDINGS: The heart size and mediastinal contours are within normal limits. Both lungs are clear. The visualized skeletal structures are unremarkable.  IMPRESSION: No active cardiopulmonary disease. Electronically Signed   By: Kerby Moors M.D.   On: 05/30/2017 18:14    Cardiac Studies:  Assessment/Plan:  Acute coronary syndrome rule out MI History of anterolateral wall MI in the past Multivessel CAD Hypertension Hyperlipidemia History of paroxysmal A. 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 History of bilateral hydronephrosis in the past History of kidney stones in the past Plan Continue present management Slow hydration Monitor renal function Will review angiogram from past. Will discuss with patient regarding noninvasive stress testing versus left cardiac catheterization depending on his renal function status and cardiac enzymes. Check labs in a.m.  LOS: 1 day    Charolette Forward 05/31/2017, 5:01 PM

## 2017-06-01 ENCOUNTER — Encounter (HOSPITAL_COMMUNITY): Payer: Self-pay | Admitting: General Practice

## 2017-06-01 LAB — BASIC METABOLIC PANEL
Anion gap: 4 — ABNORMAL LOW (ref 5–15)
BUN: 81 mg/dL — AB (ref 6–20)
CO2: 10 mmol/L — AB (ref 22–32)
CREATININE: 2.59 mg/dL — AB (ref 0.61–1.24)
Calcium: 7.9 mg/dL — ABNORMAL LOW (ref 8.9–10.3)
Chloride: 128 mmol/L — ABNORMAL HIGH (ref 101–111)
GFR calc Af Amer: 28 mL/min — ABNORMAL LOW (ref >60)
GFR calc non Af Amer: 25 mL/min — ABNORMAL LOW (ref >60)
Glucose, Bld: 79 mg/dL (ref 65–99)
Potassium: 3.9 mmol/L (ref 3.5–5.1)
Sodium: 142 mmol/L (ref 135–145)

## 2017-06-01 LAB — CBC
HEMATOCRIT: 37.1 % — AB (ref 39.0–52.0)
Hemoglobin: 12.2 g/dL — ABNORMAL LOW (ref 13.0–17.0)
MCH: 29 pg (ref 26.0–34.0)
MCHC: 32.9 g/dL (ref 30.0–36.0)
MCV: 88.3 fL (ref 78.0–100.0)
Platelets: 249 10*3/uL (ref 150–400)
RBC: 4.2 MIL/uL — ABNORMAL LOW (ref 4.22–5.81)
RDW: 17.2 % — AB (ref 11.5–15.5)
WBC: 11.5 10*3/uL — ABNORMAL HIGH (ref 4.0–10.5)

## 2017-06-01 LAB — URINE CULTURE

## 2017-06-01 LAB — HEPARIN LEVEL (UNFRACTIONATED): Heparin Unfractionated: 0.48 IU/mL (ref 0.30–0.70)

## 2017-06-01 LAB — TROPONIN I: Troponin I: 0.2 ng/mL (ref <0.03)

## 2017-06-01 MED ORDER — PANTOPRAZOLE SODIUM 40 MG PO TBEC
40.0000 mg | DELAYED_RELEASE_TABLET | Freq: Every day | ORAL | Status: DC
  Administered 2017-06-01 – 2017-06-02 (×2): 40 mg via ORAL
  Filled 2017-06-01 (×2): qty 1

## 2017-06-01 NOTE — Progress Notes (Signed)
CRITICAL VALUE ALERT  Critical Value:  Troponin .20  Date & Time Notied:  06/01/17, 4:12am  Provider Notified: Paged Dr. Terrence Dupont office- he returned call  Orders Received/Actions taken: no new orders

## 2017-06-01 NOTE — Progress Notes (Signed)
[place] for heparin Indication: chest pain/ACS  Patient Measurements: Height: 5\' 11"  (180.3 cm) Weight: 160 lb (72.6 kg) IBW/kg (Calculated) : 75.3 Heparin Dosing Weight: 72.6 kg  Assessment: 66 year old male coming in with chest pain with a significant cardiac history. No oral anticoagulants listed prior to admission. H/H and platelets are stable. Pt remains on heparin while decision is being made about stress test vs cath lab. Heparin is therapeutic this morning. Hgb 12.2, plts wnl.   Goal of Therapy:  Heparin level 0.3-0.7 units/ml Monitor platelets by anticoagulation protocol: Yes    Plan: Continue heparin gtt at 900 units/hr  Monitor daily heparin level, CBC, s/s of bleed F/u plans for stress test vs cath   Hughes Better, PharmD, BCPS Clinical Pharmacist 06/01/2017 8:00 AM

## 2017-06-01 NOTE — Progress Notes (Signed)
Subjective:  Patient denies any chest pain or shortness of breath. Troponin I minimally elevated states overall feels better  Objective:  Vital Signs in the last 24 hours: Temp:  [97.8 F (36.6 C)-98.1 F (36.7 C)] 98.1 F (36.7 C) (09/18 0813) Pulse Rate:  [58-72] 66 (09/18 0813) Resp:  [8-24] 14 (09/17 2115) BP: (102-127)/(65-86) 127/86 (09/18 0804) SpO2:  [95 %-100 %] 100 % (09/18 0813)  Intake/Output from previous day: 09/17 0701 - 09/18 0700 In: 200 [P.O.:200] Out: 1600 [Urine:1600] Intake/Output from this shift: No intake/output data recorded.  Physical Exam: Neck: no adenopathy, no carotid bruit, no JVD and supple, symmetrical, trachea midline Lungs: clear to auscultation bilaterally Heart: regular rate and rhythm, S1, S2 normal and soft systolic murmur noted Abdomen: soft, non-tender; bowel sounds normal; no masses,  no organomegaly Extremities: extremities normal, atraumatic, no cyanosis or edema  Lab Results:  Recent Labs  05/31/17 0247 06/01/17 0242  WBC 14.4* 11.5*  HGB 13.1 12.2*  PLT 277 249    Recent Labs  05/31/17 0247 06/01/17 0242  NA 140 142  K 4.4 3.9  CL 124* 128*  CO2 8* 10*  GLUCOSE 85 79  BUN 94* 81*  CREATININE 2.91* 2.59*    Recent Labs  06/01/17 0242  TROPONINI 0.20*   Hepatic Function Panel No results for input(s): PROT, ALBUMIN, AST, ALT, ALKPHOS, BILITOT, BILIDIR, IBILI in the last 72 hours.  Recent Labs  05/31/17 0247  CHOL 143   No results for input(s): PROTIME in the last 72 hours.  Imaging: Imaging results have been reviewed and Dg Chest 2 View  Result Date: 05/30/2017 CLINICAL DATA:  Chest pain for 1 week. EXAM: CHEST  2 VIEW COMPARISON:  08/19/2016 FINDINGS: The heart size and mediastinal contours are within normal limits. Both lungs are clear. The visualized skeletal structures are unremarkable. IMPRESSION: No active cardiopulmonary disease. Electronically Signed   By: Kerby Moors M.D.   On: 05/30/2017 18:14     Cardiac Studies:  Assessment/Plan:  Acute coronary syndrome rule out MI History of anterolateral wall MI in the past Multivessel CAD Hypertension Hyperlipidemia History of paroxysmal A. 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 History of bilateral hydronephrosis in the past History of kidney stones in the past Plan Discussed with patient at length various options of treatment I.e. Noninvasive stress testing versus left cardiac catheterization its risk and benefits and agrees for noninvasive stress testing first.  LOS: 2 days    Charolette Forward 06/01/2017, 11:22 AM

## 2017-06-02 ENCOUNTER — Inpatient Hospital Stay (HOSPITAL_COMMUNITY): Payer: BLUE CROSS/BLUE SHIELD

## 2017-06-02 LAB — CBC
HEMATOCRIT: 33.5 % — AB (ref 39.0–52.0)
Hemoglobin: 10.8 g/dL — ABNORMAL LOW (ref 13.0–17.0)
MCH: 27.8 pg (ref 26.0–34.0)
MCHC: 32.2 g/dL (ref 30.0–36.0)
MCV: 86.1 fL (ref 78.0–100.0)
Platelets: 253 10*3/uL (ref 150–400)
RBC: 3.89 MIL/uL — ABNORMAL LOW (ref 4.22–5.81)
RDW: 16.7 % — AB (ref 11.5–15.5)
WBC: 10.6 10*3/uL — AB (ref 4.0–10.5)

## 2017-06-02 LAB — BASIC METABOLIC PANEL
Anion gap: 6 (ref 5–15)
BUN: 63 mg/dL — AB (ref 6–20)
CALCIUM: 7.4 mg/dL — AB (ref 8.9–10.3)
CHLORIDE: 125 mmol/L — AB (ref 101–111)
CO2: 10 mmol/L — ABNORMAL LOW (ref 22–32)
CREATININE: 2.56 mg/dL — AB (ref 0.61–1.24)
GFR, EST AFRICAN AMERICAN: 29 mL/min — AB (ref >60)
GFR, EST NON AFRICAN AMERICAN: 25 mL/min — AB (ref >60)
Glucose, Bld: 75 mg/dL (ref 65–99)
Potassium: 3.7 mmol/L (ref 3.5–5.1)
SODIUM: 141 mmol/L (ref 135–145)

## 2017-06-02 LAB — HEPARIN LEVEL (UNFRACTIONATED)
HEPARIN UNFRACTIONATED: 0.76 [IU]/mL — AB (ref 0.30–0.70)
HEPARIN UNFRACTIONATED: 0.47 [IU]/mL (ref 0.30–0.70)

## 2017-06-02 LAB — TROPONIN I: TROPONIN I: 0.13 ng/mL — AB (ref <0.03)

## 2017-06-02 MED ORDER — OXYCODONE HCL 5 MG PO TABS
10.0000 mg | ORAL_TABLET | Freq: Four times a day (QID) | ORAL | Status: DC | PRN
  Administered 2017-06-02: 10 mg via ORAL
  Filled 2017-06-02: qty 2

## 2017-06-02 MED ORDER — TECHNETIUM TC 99M TETROFOSMIN IV KIT
30.0000 | PACK | Freq: Once | INTRAVENOUS | Status: AC | PRN
  Administered 2017-06-02: 30 via INTRAVENOUS

## 2017-06-02 MED ORDER — TECHNETIUM TC 99M TETROFOSMIN IV KIT
10.0000 | PACK | Freq: Once | INTRAVENOUS | Status: AC | PRN
  Administered 2017-06-02: 10 via INTRAVENOUS

## 2017-06-02 MED ORDER — REGADENOSON 0.4 MG/5ML IV SOLN
INTRAVENOUS | Status: AC
  Filled 2017-06-02: qty 5

## 2017-06-02 MED ORDER — CARVEDILOL 3.125 MG PO TABS: 3.1250 mg | ORAL_TABLET | Freq: Two times a day (BID) | ORAL | 3 refills | Status: DC

## 2017-06-02 MED ORDER — REGADENOSON 0.4 MG/5ML IV SOLN
0.4000 mg | Freq: Once | INTRAVENOUS | Status: AC
  Administered 2017-06-02: 0.4 mg via INTRAVENOUS
  Filled 2017-06-02: qty 5

## 2017-06-02 MED ORDER — ASPIRIN 81 MG PO TBEC: 81.0000 mg | DELAYED_RELEASE_TABLET | Freq: Every day | ORAL | 3 refills | Status: AC

## 2017-06-02 MED ORDER — ATORVASTATIN CALCIUM 40 MG PO TABS: 40.0000 mg | ORAL_TABLET | Freq: Every day | ORAL | 3 refills | Status: DC

## 2017-06-02 MED ORDER — AMIODARONE HCL 200 MG PO TABS: 200.0000 mg | ORAL_TABLET | Freq: Every day | ORAL | 3 refills | Status: AC

## 2017-06-02 MED ORDER — CIPROFLOXACIN HCL 250 MG PO TABS: 250.0000 mg | ORAL_TABLET | Freq: Two times a day (BID) | ORAL | 0 refills | Status: AC

## 2017-06-02 NOTE — Progress Notes (Signed)
Pt stated that he does not always take his medications at home because they are hard to keep up with. I asked if he would be willing to have [facility] RN come out and he said he does not need that. Cont to monitor. Carroll Kinds RN

## 2017-06-02 NOTE — Progress Notes (Signed)
[place] for heparin Indication: chest pain/ACS  Patient Measurements: Height: 5\' 11"  (180.3 cm) Weight: 160 lb (72.6 kg) IBW/kg (Calculated) : 75.3 Heparin Dosing Weight: 72.6 kg  Assessment: 66 year old male coming in with chest pain with a significant cardiac history. No oral anticoagulants listed prior to admission. Pt remains on heparin while decision is being made about stress test vs cath lab.   Heparin level supratherapeutic at 0.76 and no infusion issues per RN. Level drawn in arm opposite the heparin gtt. Hgb downtrending slightly, plt stable. No s/s bleeding noted.   Goal of Therapy:  Heparin level 0.3-0.7 units/ml Monitor platelets by anticoagulation protocol: Yes   Plan: Decrease heparin gtt to 800 units/hr Heparin level in 8 hrs Daily heparin level and CBC Monitor for s/s bleeding Follow cardiology plan   Argie Ramming, PharmD Clinical Pharmacist 06/02/17 4:56 AM

## 2017-06-02 NOTE — Progress Notes (Signed)
[place] for heparin Indication: chest pain/ACS  Patient Measurements: Height: 5\' 11"  (180.3 cm) Weight: 159 lb 6.4 oz (72.3 kg) IBW/kg (Calculated) : 75.3 Heparin Dosing Weight: 72.6 kg  Assessment: 66 year old male coming in with chest pain with a significant cardiac history. No oral anticoagulants listed prior to admission though pt has a history of AFib. H/H and platelets are stable. Heparin is therapeutic this morning. Hgb 10.8, plts wnl.    Goal of Therapy:  Heparin level 0.3-0.7 units/ml Monitor platelets by anticoagulation protocol: Yes    Plan: Continue heparin gtt at 800 units/hr  Monitor daily heparin level, CBC F/u long-term plan for [facility] F/u results from stress test   Hughes Better, PharmD, BCPS Clinical Pharmacist 06/02/2017 2:24 PM

## 2017-06-02 NOTE — Discharge Instructions (Signed)
Acute Coronary Syndrome °Acute coronary syndrome (ACS) is a serious problem in which there is suddenly not enough blood and oxygen supplied to the heart. ACS may mean that one or more of the blood vessels in your heart (coronary arteries) may be blocked. ACS can result in chest pain or a heart attack (myocardial infarction or MI). °What are the causes? °This condition is caused by atherosclerosis, which is the buildup of fat and cholesterol (plaque) on the inside of the arteries. Over time, the plaque may narrow or block the artery, and this will lessen blood flow to the heart. Plaque can also become weak and break off within a coronary artery to form a clot and cause a sudden blockage. °What increases the risk? °The risk factors of this condition include: °· High cholesterol levels. °· High blood pressure (hypertension). °· Smoking. °· Diabetes. °· Age. °· Family history of chest pain, heart disease, or stroke. °· Lack of exercise. °What are the signs or symptoms? °The most common signs of this condition include: °· Chest pain, which can be: °¨ A crushing or squeezing in the chest. °¨ A tightness, pressure, fullness, or heaviness in the chest. °¨ Present for more than a few minutes, or it can stop and recur. °· Pain in the arms, neck, jaw, or back. °· Unexplained heartburn or indigestion. °· Shortness of breath. °· Nausea. °· Sudden cold sweats. °· Feeling light-headed or dizzy. °Sometimes, this condition has no symptoms. °How is this diagnosed? °ACS may be diagnosed through the following tests: °· Electrocardiogram (ECG). °· Blood tests. °· Coronary angiogram. This is a procedure to look at the coronary arteries to see if there is any blockage. °How is this treated? °Treatment for ACS may include: °· Healthy behavioral changes to reduce or control risk factors. °· Medicine. °· Coronary stenting. A stent helps to keep an artery open. °· Coronary angioplasty. This procedure widens a narrowed or blocked  artery. °· Coronary artery bypass surgery. This will allow your blood to pass the blockage (bypass) to reach your heart. °Follow these instructions at home: °Eating and drinking °· Follow a heart-healthy diet. A dietitian can you help to educate you about healthy food options and changes. °· Use healthy cooking methods such as roasting, grilling, broiling, baking, poaching, steaming, or stir-frying. Talk to a dietitian to learn more about healthy cooking methods. °Medicines °· Take medicines only as directed by your health care provider. °· Do not take the following medicines unless your health care provider approves: °¨ Nonsteroidal anti-inflammatory drugs (NSAIDs), such as ibuprofen, naproxen, or celecoxib. °¨ Vitamin supplements that contain vitamin A, vitamin E, or both. °¨ Hormone replacement therapy that contains estrogen with or without progestin. °· Stop illegal drug use. °Activity °· Follow an exercise program that is approved by your health care provider. °· Plan rest periods when you are fatigued. °Lifestyle °· Do not use any tobacco products, including cigarettes, chewing tobacco, or electronic cigarettes. If you need help quitting, ask your health care provider. °· If you drink alcohol, and your health care provider approves, limit your alcohol intake to no more than 1 drink per day. One drink equals 12 ounces of beer, 5 ounces of wine, or 1½ ounces of hard liquor. °· Learn to manage stress. °· Maintain a healthy weight. Lose weight as approved by your health care provider. °General instructions °· Manage other health conditions, such as hypertension and diabetes, as directed by your health care provider. °· Keep all follow-up visits as directed by your   health care provider. This is important. °· Your health care provider may ask you to monitor your blood pressure. A blood pressure reading consists of a higher number over a lower number, such as 110 over 72, written as 110/72. Ideally, your blood  pressure should be: °¨ Below 140/90 if you have no other medical conditions. °¨ Below 130/80 if you have diabetes or kidney disease. °Get help right away if: °· You have pain in your chest, neck, arm, jaw, stomach, or back that lasts more than a few minutes, is recurring, or is not relieved by taking medicine under your tongue (sublingual nitroglycerin). °· You have profuse sweating without cause. °· You have unexplained: °¨ Heartburn or indigestion. °¨ Shortness of breath or difficulty breathing. °¨ Nausea or vomiting. °¨ Fatigue. °¨ Feelings of nervousness or anxiety. °¨ Weakness. °¨ Diarrhea. °· You have sudden light-headedness or dizziness. °· You faint. °These symptoms may represent a serious problem that is an emergency. Do not wait to see if the symptoms will go away. Get medical help right away. Call your local emergency services (911 in the [place] Do not drive yourself to the clinic or hospital.  °This information is not intended to replace advice given to you by your health care provider. Make sure you discuss any questions you have with your health care provider. °Document Released: 08/31/2005 Document Revised: 02/12/2016 Document Reviewed: 01/02/2014 °Elsevier Interactive Patient Education © 2017 [employer] ° °

## 2017-06-02 NOTE — Discharge Summary (Signed)
Discharge summary dictated on 06/02/2017 dictation number is [id number]

## 2017-06-02 NOTE — Plan of Care (Signed)
Problem: Nutrition: Goal: Adequate nutrition will be maintained Outcome: Progressing Consumes 100% of meals. Endorses good appetite

## 2017-06-02 NOTE — Discharge Summary (Signed)
NAMEXAIVIER, MALAY                 ACCOUNT NO.:  [id number]  MEDICAL RECORD NO.:  [id number]  LOCATION:                                 FACILITY:  PHYSICIAN:  Betsaida [place] N. Terrence Dupont, M.D. DATE OF BIRTH:  11/29/1950  DATE OF ADMISSION:  05/30/2017 DATE OF DISCHARGE:  06/02/2017                              DISCHARGE SUMMARY   ADMITTING DIAGNOSES: 1. Acute coronary syndrome, rule out myocardial infarction. 2. 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 3. Multivessel coronary artery disease. 4. Hypertension. 5. Hyperlipidemia. 6. History of paroxysmal atrial fibrillation in the past, CHADS-VASC     score of 2.  Not a candidate for chronic anticoagulation. 7. History of gastrointestinal bleed in the past. 8. Acute on chronic kidney disease stage IV. 9. Prerenal azotemia. 10.Leukocytosis, rule out urinary tract infection. 11.Ethyl alcohol abuse. 12.Tobacco abuse. 13.High-grade metastatic bladder cancer, history of multiple     urological procedures. 14.History of bilateral hydronephrosis in the past. 15.History of kidney stones in the past.  FINAL DIAGNOSES: 1. Status post very small non-Q-wave myocardial infarction. 2. 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 3. Multivessel coronary artery disease. 4. Ischemic cardiomyopathy. 5. Resolving urinary tract infection. 6. Hypertension. 7. Hyperlipidemia. 8. History of paroxysmal atrial fibrillation in the past. CHADS-VASc     score of 2.  Not a candidate for chronic anticoagulation. 9. History of gastrointestinal bleed in the past. 10.Acute on chronic kidney injury stage IV. 11.Status post prerenal azotemia. 12.Resolving urinary tract infection. 13.Ethyl alcohol abuse. 14.Tobacco abuse. 15.High-grade metastatic bladder cancer, history of multiple     urological surgeries in the past. 16.History of bilateral hydronephrosis in the past. 17.History of kidney stones in the past.  DISCHARGE  HOME MEDICATIONS: 1. Amiodarone 200 mg 1 tablet daily. 2. Aspirin 81 mg daily. 3. Ciprofloxacin 250 mg 1 tablet twice daily for 7 days. 4. Colace 100 mg twice daily as needed. 5. Nitrostat sublingual p.r.n. 6. Oxycodone 10 mg every 6 hours as before. 7. Protonix 40 mg twice daily. 8. Senokot 2 tablets daily as needed. 9. Atorvastatin 40 mg 1 tablet daily. 10.Carvedilol 3.125 mg twice daily.  DIET:  Low salt, low cholesterol.  ACTIVITY:  As tolerated.  CONDITION AT DISCHARGE:  Stable.  BRIEF HISTORY AND HOSPITAL COURSE:  Mr. Silguero is a 66 year old male with past medical history significant for coronary artery disease, history of anterolateral wall myocardial infarction in the past, hypertension, hyperlipidemia, history of tobacco abuse, EtOH abuse, history of bladder cancer, status post radical cystoprostatectomy with bilateral pelvic lymphadenopathy and ileal loop conduit for urinary diversion and recently had implantation of the ureter to the conduit, history of paroxysmal AFib, CHADS-VASc score of 2, history of GI bleed, not a candidate for chronic anticoagulation.  He came to the ER complaining of recurrent retrosternal and left-sided chest pain radiating to left shoulder and left arm, off and on, lasting few minutes for the last one and half weeks.  States pain relieves with sublingual nitro in the past, but today the pain got worse, requiring multiple nitro with partial relief, so decided to come to the ED.  While waiting in the ED, he took  4 total sublingual nitro with relief of chest pain.  The patient was started on IV heparin and nitro in the ED with relief of chest pain. States chest pain increases occasionally with deep breathing.  EKG done in the ER showed normal sinus rhythm with old anterolateral wall MI and T-wave inversion in anterolateral leads, which was slightly more prominent than prior EKG.  First set of troponin I was negative.  PHYSICAL EXAMINATION:   GENERAL:  On examination, he was alert, awake, oriented. VITAL SIGNS:  Blood pressure was 100/75.  Pulse 80.  He was afebrile. HEENT:  Conjunctiva was pink. NECK:  Supple.  No JVD.  No bruit. LUNGS:  Clear to auscultation without rhonchi or rales. CARDIOVASCULAR:  S1, S2 were normal.  There was soft systolic murmur. ABDOMEN:  Soft.  Bowel sounds were present.  There was cystostomy bag noted.  Urine was cloudy. NEURO:  Grossly intact.  LABORATORY DATA:  Sodium was 139, potassium 4.5, BUN was 91, creatinine 3.01.  His hemoglobin was 12.9, hematocrit 39.7, white count of 18.1, repeat hemoglobin 13.1, hematocrit 40.6, white count 14.4 which is trending down.  Today, his hemoglobin is 10.8, hematocrit 33.5, white count is trending down to 10.6.  Electrolytes today, sodium 141, potassium 3.7, BUN 63, creatinine 2.56 which is trending down.  Troponin I was mildly elevated at 0.09, 0.17, 0.20, and 0.13 which is trending down.  His cholesterol was 143, HDL 35, LDL 86, triglycerides were 112.  BRIEF HOSPITAL COURSE:  The patient was admitted to telemetry unit.  The patient ruled in for myocardial infarction due to minimally elevated troponin I.  The patient did not have further episodes of chest pain during the hospital stay.  Pan-cultures were obtained also.  The patient was started on IV Rocephin, which has been switched to p.o. Cipro.  The patient was discussed at length various options of treatment and agrees with noninvasive stress testing first which he underwent today, which showed large area of infarction involving the apex, mid to distal anterior wall, and distal lateral and inferior wall, no evidence of myocardial ischemia, EF of 41%.  Discussed with the patient about the above findings and medical management to which he agrees.  The patient will be started on BiDil as outpatient as his blood pressure tolerates and up-titrate the beta-blockers as his blood pressure tolerates.   The patient will be followed up closely in my office in 1 week and will be followed by Urology as scheduled.     Allegra Lai. Terrence Dupont, M.D.     M[facility]/MEDQ  D:  06/02/2017  T:  06/02/2017  Job:  [id number]

## 2017-06-02 NOTE — Progress Notes (Signed)
Subjective:  Patient denies any chest pain or shortness of breath. Seen in nuclear medicine department earlier today.  Objective:  Vital Signs in the last 24 hours: Temp:  [97.7 F (36.5 C)-98.2 F (36.8 C)] 98 F (36.7 C) (09/19 1127) Pulse Rate:  [59-73] 63 (09/19 1127) Resp:  [15-17] 17 (09/19 0451) BP: (98-126)/(59-85) 126/85 (09/19 1127) SpO2:  [98 %-100 %] 100 % (09/19 1127) Weight:  [72.3 kg (159 lb 6.4 oz)] 72.3 kg (159 lb 6.4 oz) (09/19 0451)  Intake/Output from previous day: 09/18 0701 - 09/19 0700 In: 4788.9 [P.O.:240; I.V.:4448.9; IV Piggyback:100] Out: 2100 [Urine:2100] Intake/Output from this shift: No intake/output data recorded.  Physical Exam: Neck: no adenopathy, no carotid bruit, no JVD and supple, symmetrical, trachea midline Lungs: clear to auscultation bilaterally Heart: regular rate and rhythm, S1, S2 normal and soft systolic murmur noted Abdomen: soft, non-tender; bowel sounds normal; no masses,  no organomegaly Extremities: extremities normal, atraumatic, no cyanosis or edema  Lab Results:  Recent Labs  06/01/17 0242 06/02/17 0327  WBC 11.5* 10.6*  HGB 12.2* 10.8*  PLT 249 253    Recent Labs  06/01/17 0242 06/02/17 0327  NA 142 141  K 3.9 3.7  CL 128* 125*  CO2 10* 10*  GLUCOSE 79 75  BUN 81* 63*  CREATININE 2.59* 2.56*    Recent Labs  06/01/17 0242 06/02/17 0327  TROPONINI 0.20* 0.13*   Hepatic Function Panel No results for input(s): PROT, ALBUMIN, AST, ALT, ALKPHOS, BILITOT, BILIDIR, IBILI in the last 72 hours.  Recent Labs  05/31/17 0247  CHOL 143   No results for input(s): PROTIME in the last 72 hours.  Imaging: Imaging results have been reviewed and No results found.  Cardiac Studies:  Assessment/Plan:  Acute small non-Q-wave myocardial infarction History of anterolateral wall MI in the past Multivessel CAD Hypertension Hyperlipidemia History of paroxysmal A. fib in the past chart vascscore of 2 History of  GI bleed in the past Acute on chronic kidney injury stage IV improving slowly Prerenal azotemia Resolving UTI EtOH abuse Tobacco abuse High-grade metastatic bladder cancer history of multiple urological surgeries. History of bilateral hydronephrosis in the past History of kidney stones in the past Plan New present management Change IV fluids to half-normal saline as per orders Check nuclear stress test results  LOS: 3 days    Charolette Forward 06/02/2017, 12:57 PM

## 2017-06-02 NOTE — Plan of Care (Signed)
Problem: Activity: Goal: Ability to tolerate increased activity will improve Outcome: Completed/Met Date Met: 06/02/17 Pt ambulating in room without cp or sob

## 2017-06-12 ENCOUNTER — Encounter (HOSPITAL_COMMUNITY)
Admission: EM | Disposition: A | Discharge status: Home Health | Payer: Self-pay | Source: Home / Self Care | Attending: Cardiology

## 2017-06-12 ENCOUNTER — Encounter (HOSPITAL_COMMUNITY): Payer: Self-pay | Admitting: Emergency Medicine

## 2017-06-12 ENCOUNTER — Emergency Department (HOSPITAL_COMMUNITY): Payer: BLUE CROSS/BLUE SHIELD

## 2017-06-12 ENCOUNTER — Inpatient Hospital Stay (HOSPITAL_COMMUNITY)
Admission: EM | Admit: 2017-06-12 | Discharge: 2017-06-21 | DRG: 222 | Disposition: A | Discharge status: Home Health | Payer: BLUE CROSS/BLUE SHIELD | Attending: Cardiology | Admitting: Cardiology

## 2017-06-12 DIAGNOSIS — Z8249 Family history of ischemic heart disease and other diseases of the circulatory system: Secondary | ICD-10-CM | POA: Diagnosis not present

## 2017-06-12 DIAGNOSIS — I4901 Ventricular fibrillation: Secondary | ICD-10-CM | POA: Diagnosis not present

## 2017-06-12 DIAGNOSIS — R319 Hematuria, unspecified: Secondary | ICD-10-CM | POA: Diagnosis not present

## 2017-06-12 DIAGNOSIS — I130 Hypertensive heart and chronic kidney disease with heart failure and stage 1 through stage 4 chronic kidney disease, or unspecified chronic kidney disease: Secondary | ICD-10-CM | POA: Diagnosis present

## 2017-06-12 DIAGNOSIS — I200 Unstable angina: Secondary | ICD-10-CM | POA: Diagnosis not present

## 2017-06-12 DIAGNOSIS — Z955 Presence of coronary angioplasty implant and graft: Secondary | ICD-10-CM

## 2017-06-12 DIAGNOSIS — Z936 Other artificial openings of urinary tract status: Secondary | ICD-10-CM

## 2017-06-12 DIAGNOSIS — Z8051 Family history of malignant neoplasm of kidney: Secondary | ICD-10-CM

## 2017-06-12 DIAGNOSIS — I340 Nonrheumatic mitral (valve) insufficiency: Secondary | ICD-10-CM | POA: Diagnosis not present

## 2017-06-12 DIAGNOSIS — Z87442 Personal history of urinary calculi: Secondary | ICD-10-CM

## 2017-06-12 DIAGNOSIS — Z87448 Personal history of other diseases of urinary system: Secondary | ICD-10-CM | POA: Diagnosis not present

## 2017-06-12 DIAGNOSIS — I2510 Atherosclerotic heart disease of native coronary artery without angina pectoris: Secondary | ICD-10-CM | POA: Diagnosis present

## 2017-06-12 DIAGNOSIS — Z8551 Personal history of malignant neoplasm of bladder: Secondary | ICD-10-CM | POA: Diagnosis not present

## 2017-06-12 DIAGNOSIS — F17210 Nicotine dependence, cigarettes, uncomplicated: Secondary | ICD-10-CM | POA: Diagnosis present

## 2017-06-12 DIAGNOSIS — R570 Cardiogenic shock: Secondary | ICD-10-CM | POA: Diagnosis not present

## 2017-06-12 DIAGNOSIS — I480 Paroxysmal atrial fibrillation: Secondary | ICD-10-CM | POA: Diagnosis present

## 2017-06-12 DIAGNOSIS — I252 Old myocardial infarction: Secondary | ICD-10-CM | POA: Diagnosis not present

## 2017-06-12 DIAGNOSIS — I5022 Chronic systolic (congestive) heart failure: Secondary | ICD-10-CM | POA: Diagnosis present

## 2017-06-12 DIAGNOSIS — K5900 Constipation, unspecified: Secondary | ICD-10-CM | POA: Diagnosis not present

## 2017-06-12 DIAGNOSIS — Z95810 Presence of automatic (implantable) cardiac defibrillator: Secondary | ICD-10-CM

## 2017-06-12 DIAGNOSIS — Z801 Family history of malignant neoplasm of trachea, bronchus and lung: Secondary | ICD-10-CM | POA: Diagnosis not present

## 2017-06-12 DIAGNOSIS — K219 Gastro-esophageal reflux disease without esophagitis: Secondary | ICD-10-CM | POA: Diagnosis present

## 2017-06-12 DIAGNOSIS — I255 Ischemic cardiomyopathy: Secondary | ICD-10-CM | POA: Diagnosis present

## 2017-06-12 DIAGNOSIS — I2119 ST elevation (STEMI) myocardial infarction involving other coronary artery of inferior wall: Principal | ICD-10-CM | POA: Diagnosis present

## 2017-06-12 DIAGNOSIS — E876 Hypokalemia: Secondary | ICD-10-CM | POA: Diagnosis present

## 2017-06-12 DIAGNOSIS — E785 Hyperlipidemia, unspecified: Secondary | ICD-10-CM | POA: Diagnosis present

## 2017-06-12 DIAGNOSIS — I2111 ST elevation (STEMI) myocardial infarction involving right coronary artery: Secondary | ICD-10-CM | POA: Diagnosis not present

## 2017-06-12 DIAGNOSIS — R079 Chest pain, unspecified: Secondary | ICD-10-CM

## 2017-06-12 DIAGNOSIS — Z7982 Long term (current) use of aspirin: Secondary | ICD-10-CM | POA: Diagnosis not present

## 2017-06-12 DIAGNOSIS — N390 Urinary tract infection, site not specified: Secondary | ICD-10-CM | POA: Diagnosis present

## 2017-06-12 DIAGNOSIS — D62 Acute posthemorrhagic anemia: Secondary | ICD-10-CM | POA: Diagnosis not present

## 2017-06-12 DIAGNOSIS — I472 Ventricular tachycardia: Secondary | ICD-10-CM | POA: Diagnosis not present

## 2017-06-12 DIAGNOSIS — N183 Chronic kidney disease, stage 3 (moderate): Secondary | ICD-10-CM | POA: Diagnosis present

## 2017-06-12 DIAGNOSIS — R0602 Shortness of breath: Secondary | ICD-10-CM | POA: Diagnosis present

## 2017-06-12 DIAGNOSIS — F1010 Alcohol abuse, uncomplicated: Secondary | ICD-10-CM | POA: Diagnosis present

## 2017-06-12 HISTORY — PX: CORONARY STENT INTERVENTION: CATH118234

## 2017-06-12 HISTORY — PX: LEFT HEART CATH AND CORONARY ANGIOGRAPHY: CATH118249

## 2017-06-12 LAB — CBC WITH DIFFERENTIAL/PLATELET
BASOS PCT: 0 %
Basophils Absolute: 0 10*3/uL (ref 0.0–0.1)
Eosinophils Absolute: 0 10*3/uL (ref 0.0–0.7)
Eosinophils Relative: 0 %
HEMATOCRIT: 29.2 % — AB (ref 39.0–52.0)
HEMOGLOBIN: 9.7 g/dL — AB (ref 13.0–17.0)
Lymphocytes Relative: 8 %
Lymphs Abs: 0.9 10*3/uL (ref 0.7–4.0)
MCH: 28.6 pg (ref 26.0–34.0)
MCHC: 33.2 g/dL (ref 30.0–36.0)
MCV: 86.1 fL (ref 78.0–100.0)
MONO ABS: 0.8 10*3/uL (ref 0.1–1.0)
MONOS PCT: 7 %
NEUTROS ABS: 9.6 10*3/uL — AB (ref 1.7–7.7)
NEUTROS PCT: 85 %
Platelets: 235 10*3/uL (ref 150–400)
RBC: 3.39 MIL/uL — ABNORMAL LOW (ref 4.22–5.81)
RDW: 17.5 % — AB (ref 11.5–15.5)
WBC: 11.3 10*3/uL — ABNORMAL HIGH (ref 4.0–10.5)

## 2017-06-12 LAB — PROTIME-INR
INR: 1.14
Prothrombin Time: 14.5 seconds (ref 11.4–15.2)

## 2017-06-12 LAB — APTT: APTT: 27 s (ref 24–36)

## 2017-06-12 LAB — BASIC METABOLIC PANEL
BUN: 96 mg/dL — AB (ref 6–20)
BUN: 95 mg/dL — ABNORMAL HIGH (ref 6–20)
CALCIUM: 7.6 mg/dL — AB (ref 8.9–10.3)
CHLORIDE: 121 mmol/L — AB (ref 101–111)
CHLORIDE: 126 mmol/L — AB (ref 101–111)
CO2: <7 mmol/L — ABNORMAL LOW (ref 22–32)
CREATININE: 5.12 mg/dL — AB (ref 0.61–1.24)
CREATININE: 4.58 mg/dL — AB (ref 0.61–1.24)
Calcium: 8.6 mg/dL — ABNORMAL LOW (ref 8.9–10.3)
GFR calc Af Amer: 12 mL/min — ABNORMAL LOW (ref >60)
GFR calc Af Amer: 14 mL/min — ABNORMAL LOW (ref >60)
GFR calc non Af Amer: 11 mL/min — ABNORMAL LOW (ref >60)
GFR calc non Af Amer: 12 mL/min — ABNORMAL LOW (ref >60)
GLUCOSE: 127 mg/dL — AB (ref 65–99)
GLUCOSE: 93 mg/dL (ref 65–99)
POTASSIUM: 4.1 mmol/L (ref 3.5–5.1)
Potassium: 4 mmol/L (ref 3.5–5.1)
Sodium: 140 mmol/L (ref 135–145)
Sodium: 139 mmol/L (ref 135–145)

## 2017-06-12 LAB — CBC
HEMATOCRIT: 36 % — AB (ref 39.0–52.0)
Hemoglobin: 11.6 g/dL — ABNORMAL LOW (ref 13.0–17.0)
MCH: 27.8 pg (ref 26.0–34.0)
MCHC: 32.2 g/dL (ref 30.0–36.0)
MCV: 86.3 fL (ref 78.0–100.0)
PLATELETS: 281 10*3/uL (ref 150–400)
RBC: 4.17 MIL/uL — ABNORMAL LOW (ref 4.22–5.81)
RDW: 17.6 % — AB (ref 11.5–15.5)
WBC: 15 10*3/uL — ABNORMAL HIGH (ref 4.0–10.5)

## 2017-06-12 LAB — LIPID PANEL
CHOLESTEROL: 132 mg/dL (ref 0–200)
HDL: 38 mg/dL — ABNORMAL LOW (ref >40)
LDL Cholesterol: 74 mg/dL (ref 0–99)
TRIGLYCERIDES: 98 mg/dL (ref <150)
Total CHOL/HDL Ratio: 3.5 RATIO
VLDL: 20 mg/dL (ref 0–40)

## 2017-06-12 LAB — I-STAT TROPONIN, ED: Troponin i, poc: 9.64 ng/mL (ref 0.00–0.08)

## 2017-06-12 LAB — MRSA PCR SCREENING: MRSA by PCR: NEGATIVE

## 2017-06-12 LAB — GLUCOSE, CAPILLARY: GLUCOSE-CAPILLARY: 80 mg/dL (ref 65–99)

## 2017-06-12 LAB — TROPONIN I: TROPONIN I: 8.21 ng/mL — AB (ref <0.03)

## 2017-06-12 LAB — BRAIN NATRIURETIC PEPTIDE: B Natriuretic Peptide: 1171.5 pg/mL — ABNORMAL HIGH (ref 0.0–100.0)

## 2017-06-12 SURGERY — LEFT HEART CATH AND CORONARY ANGIOGRAPHY
Anesthesia: LOCAL

## 2017-06-12 MED ORDER — ONDANSETRON HCL 4 MG/2ML IJ SOLN: 4.0000 mg | Freq: Four times a day (QID) | INTRAMUSCULAR | Status: DC | PRN

## 2017-06-12 MED ORDER — TICAGRELOR 90 MG PO TABS
90.0000 mg | ORAL_TABLET | Freq: Two times a day (BID) | ORAL | Status: DC
  Administered 2017-06-12 – 2017-06-21 (×17): 90 mg via ORAL
  Filled 2017-06-12 (×20): qty 1

## 2017-06-12 MED ORDER — FENTANYL CITRATE (PF) 100 MCG/2ML IJ SOLN
INTRAMUSCULAR | Status: DC | PRN
  Administered 2017-06-12 (×2): 25 ug via INTRAVENOUS

## 2017-06-12 MED ORDER — MIDAZOLAM HCL 2 MG/2ML IJ SOLN
INTRAMUSCULAR | Status: AC
  Filled 2017-06-12: qty 2

## 2017-06-12 MED ORDER — NITROGLYCERIN 0.4 MG SL SUBL
0.4000 mg | SUBLINGUAL_TABLET | SUBLINGUAL | Status: DC | PRN
  Filled 2017-06-12: qty 1

## 2017-06-12 MED ORDER — ASPIRIN 81 MG PO CHEW
324.0000 mg | CHEWABLE_TABLET | Freq: Once | ORAL | Status: AC
  Administered 2017-06-12: 324 mg via ORAL
  Filled 2017-06-12: qty 4

## 2017-06-12 MED ORDER — FENTANYL CITRATE (PF) 100 MCG/2ML IJ SOLN
INTRAMUSCULAR | Status: AC
  Filled 2017-06-12: qty 2

## 2017-06-12 MED ORDER — TICAGRELOR 90 MG PO TABS
180.0000 mg | ORAL_TABLET | Freq: Once | ORAL | Status: AC
  Administered 2017-06-12: 180 mg via ORAL
  Filled 2017-06-12: qty 2

## 2017-06-12 MED ORDER — NITROGLYCERIN 1 MG/10 ML FOR IR/CATH LAB
INTRA_ARTERIAL | Status: AC
  Filled 2017-06-12: qty 10

## 2017-06-12 MED ORDER — HEPARIN SODIUM (PORCINE) 1000 UNIT/ML IJ SOLN
INTRAMUSCULAR | Status: AC
  Filled 2017-06-12: qty 1

## 2017-06-12 MED ORDER — LIDOCAINE HCL 2 % IJ SOLN
INTRAMUSCULAR | Status: AC
  Filled 2017-06-12: qty 10

## 2017-06-12 MED ORDER — HEPARIN SODIUM (PORCINE) 5000 UNIT/ML IJ SOLN
4000.0000 [IU] | Freq: Once | INTRAMUSCULAR | Status: AC
  Administered 2017-06-12: 4000 [IU] via INTRAVENOUS
  Filled 2017-06-12: qty 1

## 2017-06-12 MED ORDER — IOPAMIDOL (ISOVUE-370) INJECTION 76%
INTRAVENOUS | Status: AC
  Filled 2017-06-12: qty 125

## 2017-06-12 MED ORDER — NITROGLYCERIN IN D5W 200-5 MCG/ML-% IV SOLN: 5.0000 ug/min | INTRAVENOUS | Status: DC

## 2017-06-12 MED ORDER — DEXTROSE 5 % IV SOLN
1.0000 g | INTRAVENOUS | Status: AC
  Administered 2017-06-13 – 2017-06-20 (×8): 1 g via INTRAVENOUS
  Filled 2017-06-12 (×8): qty 10

## 2017-06-12 MED ORDER — CEFTRIAXONE SODIUM 1 G IJ SOLR
1.0000 g | INTRAMUSCULAR | Status: DC
  Filled 2017-06-12: qty 10

## 2017-06-12 MED ORDER — SODIUM CHLORIDE 0.9 % IV SOLN
INTRAVENOUS | Status: DC
  Administered 2017-06-12: 10:00:00 via INTRAVENOUS

## 2017-06-12 MED ORDER — HEPARIN (PORCINE) IN NACL 2-0.9 UNIT/ML-% IJ SOLN
INTRAMUSCULAR | Status: AC
  Filled 2017-06-12: qty 1000

## 2017-06-12 MED ORDER — TIROFIBAN HCL IN NACL 5-0.9 MG/100ML-% IV SOLN
INTRAVENOUS | Status: AC | PRN
  Administered 2017-06-12: 0.075 ug/kg/min via INTRAVENOUS

## 2017-06-12 MED ORDER — PANTOPRAZOLE SODIUM 40 MG PO TBEC
40.0000 mg | DELAYED_RELEASE_TABLET | Freq: Two times a day (BID) | ORAL | Status: DC
  Administered 2017-06-12 – 2017-06-16 (×7): 40 mg via ORAL
  Filled 2017-06-12 (×8): qty 1

## 2017-06-12 MED ORDER — SODIUM CHLORIDE 0.9% FLUSH
3.0000 mL | Freq: Two times a day (BID) | INTRAVENOUS | Status: DC
  Administered 2017-06-12 – 2017-06-21 (×16): 3 mL via INTRAVENOUS

## 2017-06-12 MED ORDER — SODIUM CHLORIDE 0.9 % IV SOLN
250.0000 mL | INTRAVENOUS | Status: DC | PRN
  Administered 2017-06-16: 250 mL via INTRAVENOUS

## 2017-06-12 MED ORDER — HEPARIN (PORCINE) IN NACL 2-0.9 UNIT/ML-% IJ SOLN
INTRAMUSCULAR | Status: DC | PRN
  Administered 2017-06-12: 11:00:00

## 2017-06-12 MED ORDER — SODIUM CHLORIDE 0.9 % IV SOLN
INTRAVENOUS | Status: DC | PRN
  Administered 2017-06-12: 1 mg/kg/h via INTRAVENOUS

## 2017-06-12 MED ORDER — SODIUM CHLORIDE 0.9 % IV SOLN
INTRAVENOUS | Status: AC
  Administered 2017-06-12: 18:00:00 via INTRAVENOUS

## 2017-06-12 MED ORDER — VERAPAMIL HCL 2.5 MG/ML IV SOLN
INTRAVENOUS | Status: AC
  Filled 2017-06-12: qty 2

## 2017-06-12 MED ORDER — LIDOCAINE HCL (PF) 1 % IJ SOLN
INTRAMUSCULAR | Status: DC | PRN
  Administered 2017-06-12: 15 mL

## 2017-06-12 MED ORDER — ATORVASTATIN CALCIUM 40 MG PO TABS
40.0000 mg | ORAL_TABLET | Freq: Every day | ORAL | Status: DC
  Administered 2017-06-12 – 2017-06-21 (×10): 40 mg via ORAL
  Filled 2017-06-12 (×11): qty 1

## 2017-06-12 MED ORDER — BIVALIRUDIN BOLUS VIA INFUSION - CUPID
INTRAVENOUS | Status: DC | PRN
  Administered 2017-06-12: 54.225 mg via INTRAVENOUS

## 2017-06-12 MED ORDER — MIDAZOLAM HCL 2 MG/2ML IJ SOLN
INTRAMUSCULAR | Status: DC | PRN
  Administered 2017-06-12 (×2): 1 mg via INTRAVENOUS

## 2017-06-12 MED ORDER — SODIUM CHLORIDE 0.9% FLUSH: 3.0000 mL | INTRAVENOUS | Status: DC | PRN

## 2017-06-12 MED ORDER — NITROGLYCERIN 1 MG/10 ML FOR IR/CATH LAB
INTRA_ARTERIAL | Status: DC | PRN
  Administered 2017-06-12 (×3): 100 ug via INTRACORONARY

## 2017-06-12 MED ORDER — BIVALIRUDIN TRIFLUOROACETATE 250 MG IV SOLR
INTRAVENOUS | Status: AC
  Filled 2017-06-12: qty 250

## 2017-06-12 MED ORDER — DEXTROSE 5 % IV SOLN
1.0000 g | INTRAVENOUS | Status: AC
  Administered 2017-06-12: 1 g via INTRAVENOUS
  Filled 2017-06-12: qty 10

## 2017-06-12 MED ORDER — HEPARIN SODIUM (PORCINE) 1000 UNIT/ML IJ SOLN
INTRAMUSCULAR | Status: DC | PRN
  Administered 2017-06-12 (×2): 3000 [IU] via INTRAVENOUS

## 2017-06-12 MED ORDER — HEPARIN (PORCINE) IN NACL 100-0.45 UNIT/ML-% IJ SOLN
900.0000 [IU]/h | INTRAMUSCULAR | Status: DC
  Filled 2017-06-12: qty 250

## 2017-06-12 MED ORDER — ASPIRIN EC 81 MG PO TBEC
81.0000 mg | DELAYED_RELEASE_TABLET | Freq: Every day | ORAL | Status: DC
  Administered 2017-06-13 – 2017-06-21 (×9): 81 mg via ORAL
  Filled 2017-06-12 (×9): qty 1

## 2017-06-12 MED ORDER — AMIODARONE HCL 200 MG PO TABS
200.0000 mg | ORAL_TABLET | Freq: Every day | ORAL | Status: DC
  Administered 2017-06-12 – 2017-06-15 (×4): 200 mg via ORAL
  Filled 2017-06-12 (×4): qty 1

## 2017-06-12 MED ORDER — IOPAMIDOL (ISOVUE-370) INJECTION 76%
INTRAVENOUS | Status: DC | PRN
  Administered 2017-06-12: 145 mL via INTRAVENOUS

## 2017-06-12 MED ORDER — STERILE WATER FOR INJECTION IV SOLN
INTRAVENOUS | Status: DC
  Administered 2017-06-12 – 2017-06-16 (×6): via INTRAVENOUS
  Filled 2017-06-12 (×16): qty 9.71

## 2017-06-12 SURGICAL SUPPLY — 24 items
BALLN EMERGE MR 2.0X15 (BALLOONS) ×2
BALLN EMERGE MR PUSH 1.5X20 (BALLOONS) ×2
BALLN ~~LOC~~ EMERGE MR 3.0X20 (BALLOONS) ×2
BALLOON EMERGE MR 2.0X15 (BALLOONS) IMPLANT
BALLOON EMERGE MR PUSH 1.5X20 (BALLOONS) IMPLANT
BALLOON ~~LOC~~ EMERGE MR 3.0X20 (BALLOONS) IMPLANT
CATH 5FR JL3.5 JR4 ANG PIG MP (CATHETERS) ×1 IMPLANT
CATH INFINITI 5FR JL5 (CATHETERS) ×1 IMPLANT
CATH LAUNCHER 6FR IMA (CATHETERS) ×1 IMPLANT
CATH LAUNCHER 6FR IMA SH (CATHETERS) ×1 IMPLANT
CATH LAUNCHER 6FR JR4 (CATHETERS) ×1 IMPLANT
CATH VISTA GUIDE 6FR XBLAD4 (CATHETERS) ×1 IMPLANT
GUIDE CATH RUNWAY 6FR VL3.5 (CATHETERS) ×1 IMPLANT
GUIDEWIRE INQWIRE 1.5J.035X260 (WIRE) IMPLANT
INQWIRE 1.5J .035X260CM (WIRE) ×2
KIT ENCORE 26 ADVANTAGE (KITS) ×1 IMPLANT
KIT HEART LEFT (KITS) ×2 IMPLANT
PACK CARDIAC CATHETERIZATION (CUSTOM PROCEDURE TRAY) ×2 IMPLANT
SHEATH PINNACLE 6F 10CM (SHEATH) ×1 IMPLANT
STENT SIERRA 2.75 X 23 MM (Permanent Stent) ×1 IMPLANT
TRANSDUCER W/STOPCOCK (MISCELLANEOUS) ×2 IMPLANT
TUBING CIL FLEX 10 FLL-RA (TUBING) ×2 IMPLANT
WIRE PT2 MS 185 (WIRE) ×1 IMPLANT
WIRE RUNTHROUGH .014X180CM (WIRE) ×1 IMPLANT

## 2017-06-12 NOTE — ED Provider Notes (Signed)
I was given the patient's EKG initial one had a lot of artifact and was difficult to read with a time stamp of 0832. Another was done [phone] that showed possible ST elevation in the inferior leads and also elevation with T-wave inversion in lateral leads all of which are new since 9/18. This is a concerning EKG and the patient is here with shortness of breath and history of cardiac disease so I instructed nursing to bring the patient back to room as soon as possible for better ECG and management.   Darren Carr, [name], MD 06/12/17 [phone]

## 2017-06-12 NOTE — ED Triage Notes (Signed)
Pt. Stated, I started having SOB about 3 weeks ago, and I have a dry mouth.

## 2017-06-12 NOTE — ED Notes (Signed)
Dr.Mesner shown Istat Lactic Acid results at bedside. ED-Lab
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
## 2017-06-12 NOTE — ED Notes (Signed)
Lab is adding PTT INR, lipid panel, bnp and troponin.

## 2017-06-13 ENCOUNTER — Encounter (HOSPITAL_COMMUNITY): Payer: Self-pay | Admitting: Emergency Medicine

## 2017-06-13 LAB — TROPONIN I
TROPONIN I: 26.32 ng/mL — AB (ref <0.03)
Troponin I: 25.17 ng/mL (ref <0.03)
Troponin I: 25.47 ng/mL (ref <0.03)

## 2017-06-13 LAB — CBC
HEMATOCRIT: 29.4 % — AB (ref 39.0–52.0)
HEMOGLOBIN: 9.4 g/dL — AB (ref 13.0–17.0)
MCH: 27.6 pg (ref 26.0–34.0)
MCHC: 32 g/dL (ref 30.0–36.0)
MCV: 86.2 fL (ref 78.0–100.0)
Platelets: 238 10*3/uL (ref 150–400)
RBC: 3.41 MIL/uL — ABNORMAL LOW (ref 4.22–5.81)
RDW: 17.2 % — ABNORMAL HIGH (ref 11.5–15.5)
WBC: 12.5 10*3/uL — AB (ref 4.0–10.5)

## 2017-06-13 LAB — GLUCOSE, CAPILLARY: Glucose-Capillary: 99 mg/dL (ref 65–99)

## 2017-06-13 LAB — BASIC METABOLIC PANEL
BUN: 90 mg/dL — ABNORMAL HIGH (ref 6–20)
CHLORIDE: 128 mmol/L — AB (ref 101–111)
CO2: <7 mmol/L — ABNORMAL LOW (ref 22–32)
Calcium: 7.4 mg/dL — ABNORMAL LOW (ref 8.9–10.3)
Creatinine, Ser: 4.5 mg/dL — ABNORMAL HIGH (ref 0.61–1.24)
GFR, EST AFRICAN AMERICAN: 15 mL/min — AB (ref >60)
GFR, EST NON AFRICAN AMERICAN: 13 mL/min — AB (ref >60)
Glucose, Bld: 84 mg/dL (ref 65–99)
POTASSIUM: 3.5 mmol/L (ref 3.5–5.1)
Sodium: 141 mmol/L (ref 135–145)

## 2017-06-13 LAB — LIPID PANEL
CHOL/HDL RATIO: 3.6 ratio
Cholesterol: 96 mg/dL (ref 0–200)
HDL: 27 mg/dL — AB (ref >40)
LDL CALC: 54 mg/dL (ref 0–99)
TRIGLYCERIDES: 74 mg/dL (ref <150)
VLDL: 15 mg/dL (ref 0–40)

## 2017-06-13 LAB — POCT ACTIVATED CLOTTING TIME
ACTIVATED CLOTTING TIME: 191 s
ACTIVATED CLOTTING TIME: 147 s
Activated Clotting Time: 241 seconds

## 2017-06-13 MED ORDER — ENSURE ENLIVE PO LIQD
237.0000 mL | Freq: Two times a day (BID) | ORAL | Status: DC
  Administered 2017-06-13 – 2017-06-21 (×12): 237 mL via ORAL

## 2017-06-13 MED ORDER — POLYETHYLENE GLYCOL 3350 17 G PO PACK
17.0000 g | PACK | Freq: Every day | ORAL | Status: DC
  Administered 2017-06-13 – 2017-06-19 (×4): 17 g via ORAL
  Filled 2017-06-13 (×8): qty 1

## 2017-06-13 MED ORDER — WHITE PETROLATUM GEL
Status: AC
  Filled 2017-06-13: qty 1

## 2017-06-13 MED ORDER — ACETAMINOPHEN 325 MG PO TABS
650.0000 mg | ORAL_TABLET | Freq: Four times a day (QID) | ORAL | Status: DC | PRN
Start: 2017-06-13 — End: 2017-06-21
  Administered 2017-06-13 – 2017-06-17 (×5): 650 mg via ORAL
  Filled 2017-06-13 (×5): qty 2

## 2017-06-13 NOTE — Progress Notes (Signed)
Subjective:  Patient denies any chest pain or shortness of breath up in chair. Patient had significant hematuria yesterday resolved renal function slowly improving  Objective:  Vital Signs in the last 24 hours: Temp:  [97.3 F (36.3 C)-98.1 F (36.7 C)] 97.9 F (36.6 C) (09/30 0738) Pulse Rate:  [0-127] 73 (09/30 0830) Resp:  [9-28] 16 (09/30 0830) BP: (81-134)/(55-106) 110/71 (09/30 0830) SpO2:  [0 %-100 %] 100 % (09/30 0830) Arterial Line BP: (86-94)/(45-49) 87/45 (09/29 1530)  Intake/Output from previous day: 09/29 0701 - 09/30 0700 In: 2718.8 [P.O.:440; I.V.:2278.8] Out: 875 [Urine:875] Intake/Output from this shift: Total I/O In: 75 [I.V.:75] Out: -   Physical Exam: Neck: no adenopathy, no carotid bruit, no JVD and supple, symmetrical, trachea midline Lungs: decreased breath sound at bases Heart: regular rate and rhythm, S1, S2 normal and soft systolic murmur noted Abdomen: soft, non-tender; bowel sounds normal; no masses,  no organomegaly Extremities: extremities normal, atraumatic, no cyanosis or edema and right groin stable  Lab Results:  Recent Labs  06/12/17 1520 06/13/17 0214  WBC 11.3* 12.5*  HGB 9.7* 9.4*  PLT 235 238    Recent Labs  06/12/17 1520 06/13/17 0214  NA 139 141  K 4.0 3.5  CL 126* 128*  CO2 <7* <7*  GLUCOSE 93 84  BUN 95* 90*  CREATININE 4.58* 4.50*    Recent Labs  06/12/17 0916  TROPONINI 8.21*   Hepatic Function Panel No results for input(s): PROT, ALBUMIN, AST, ALT, ALKPHOS, BILITOT, BILIDIR, IBILI in the last 72 hours.  Recent Labs  06/13/17 0214  CHOL 96   No results for input(s): PROTIME in the last 72 hours.  Imaging: Imaging results have been reviewed and Dg Chest Portable 1 View  Result Date: 06/12/2017 CLINICAL DATA:  Acute shortness of breath. EXAM: PORTABLE CHEST 1 VIEW COMPARISON:  05/30/2017 and prior exams FINDINGS: The cardiomediastinal silhouette is unremarkable. There is no evidence of focal airspace  disease, pulmonary edema, suspicious pulmonary nodule/mass, pleural effusion, or pneumothorax. No acute bony abnormalities are identified. IMPRESSION: No active disease. Electronically Signed   By: Margarette Canada M.D.   On: 06/12/2017 10:15    Cardiac Studies:  Assessment/Plan:  Acute inferolateral wall myocardial infarction status post PCI to distal RCA Multivessel CAD history of MI in the past Hypertension Hyperlipidemia History of paroxysmal A. 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 History of bilateral hydronephrosis in the past History of kidney stones in the past Acute on chronic blood loss anemia secondary to blood loss during the procedure/hematuria/hydration Plan As per orders   LOS: 1 day    Charolette Forward 06/13/2017, 9:40 AM

## 2017-06-14 ENCOUNTER — Encounter (HOSPITAL_COMMUNITY): Payer: Self-pay | Admitting: Cardiology

## 2017-06-14 LAB — BASIC METABOLIC PANEL
ANION GAP: 12 (ref 5–15)
Anion gap: 10 (ref 5–15)
BUN: 90 mg/dL — ABNORMAL HIGH (ref 6–20)
BUN: 83 mg/dL — AB (ref 6–20)
CHLORIDE: 124 mmol/L — AB (ref 101–111)
CHLORIDE: 122 mmol/L — AB (ref 101–111)
CO2: 9 mmol/L — AB (ref 22–32)
CO2: 10 mmol/L — ABNORMAL LOW (ref 22–32)
Calcium: 7.2 mg/dL — ABNORMAL LOW (ref 8.9–10.3)
Calcium: 7.3 mg/dL — ABNORMAL LOW (ref 8.9–10.3)
Creatinine, Ser: 4.28 mg/dL — ABNORMAL HIGH (ref 0.61–1.24)
Creatinine, Ser: 4.19 mg/dL — ABNORMAL HIGH (ref 0.61–1.24)
GFR calc non Af Amer: 13 mL/min — ABNORMAL LOW (ref >60)
GFR, EST AFRICAN AMERICAN: 15 mL/min — AB (ref >60)
GFR, EST AFRICAN AMERICAN: 16 mL/min — AB (ref >60)
GFR, EST NON AFRICAN AMERICAN: 14 mL/min — AB (ref >60)
Glucose, Bld: 102 mg/dL — ABNORMAL HIGH (ref 65–99)
Glucose, Bld: 113 mg/dL — ABNORMAL HIGH (ref 65–99)
POTASSIUM: 2.6 mmol/L — AB (ref 3.5–5.1)
POTASSIUM: 3.2 mmol/L — AB (ref 3.5–5.1)
SODIUM: 143 mmol/L (ref 135–145)
SODIUM: 144 mmol/L (ref 135–145)

## 2017-06-14 LAB — POCT ACTIVATED CLOTTING TIME
ACTIVATED CLOTTING TIME: 488 s
ACTIVATED CLOTTING TIME: 301 s

## 2017-06-14 LAB — CBC
HEMATOCRIT: 24.6 % — AB (ref 39.0–52.0)
HEMOGLOBIN: 8.3 g/dL — AB (ref 13.0–17.0)
MCH: 27.7 pg (ref 26.0–34.0)
MCHC: 33.7 g/dL (ref 30.0–36.0)
MCV: 82 fL (ref 78.0–100.0)
Platelets: 239 10*3/uL (ref 150–400)
RBC: 3 MIL/uL — AB (ref 4.22–5.81)
RDW: 16.7 % — ABNORMAL HIGH (ref 11.5–15.5)
WBC: 11 10*3/uL — ABNORMAL HIGH (ref 4.0–10.5)

## 2017-06-14 LAB — TROPONIN I: TROPONIN I: 25.28 ng/mL — AB (ref <0.03)

## 2017-06-14 LAB — MAGNESIUM: Magnesium: 1.9 mg/dL (ref 1.7–2.4)

## 2017-06-14 MED ORDER — POTASSIUM CHLORIDE CRYS ER 20 MEQ PO TBCR
40.0000 meq | EXTENDED_RELEASE_TABLET | Freq: Once | ORAL | Status: AC
  Administered 2017-06-14: 40 meq via ORAL
  Filled 2017-06-14: qty 2

## 2017-06-14 MED ORDER — SODIUM BICARBONATE 650 MG PO TABS
1300.0000 mg | ORAL_TABLET | Freq: Two times a day (BID) | ORAL | Status: DC
  Administered 2017-06-14 – 2017-06-17 (×4): 1300 mg via ORAL
  Filled 2017-06-14 (×6): qty 2

## 2017-06-14 MED ORDER — POTASSIUM CHLORIDE 10 MEQ/100ML IV SOLN
10.0000 meq | INTRAVENOUS | Status: AC
  Administered 2017-06-14 (×4): 10 meq via INTRAVENOUS
  Filled 2017-06-14 (×4): qty 100

## 2017-06-14 MED FILL — Lidocaine HCl Local Inj 2%: INTRAMUSCULAR | Qty: 20 | Status: AC

## 2017-06-14 MED FILL — Verapamil HCl IV Soln 2.5 MG/ML: INTRAVENOUS | Qty: 2 | Status: AC

## 2017-06-14 NOTE — Progress Notes (Signed)
CRITICAL VALUE ALERT  Critical Value:  Potassium 2.6  Date & Time Notied:  06/14/2017 0336   Provider Notified: Terrence Dupont  Orders Received/Actions taken: 69meq K PO.  10 meq K IV q1h x4. Recheck BMP and Mag after completion of K runners.

## 2017-06-14 NOTE — Progress Notes (Signed)
Subjective:  Denies any chest pain or shortness of breath complains of constipation. Renal function slowly improving. No further episodes of hematuria.  Objective:  Vital Signs in the last 24 hours: Temp:  [97.6 F (36.4 C)-98.6 F (37 C)] 97.6 F (36.4 C) (10/01 1125) Pulse Rate:  [31-108] 108 (10/01 1100) Resp:  [15-32] 19 (10/01 1100) BP: (92-106)/(54-85) 101/85 (10/01 1100) SpO2:  [100 %] 100 % (10/01 1100) Weight:  [66.9 kg (147 lb 7.8 oz)] 66.9 kg (147 lb 7.8 oz) (10/01 0400)  Intake/Output from previous day: 09/30 0701 - 10/01 0700 In: 3285 [P.O.:840; I.V.:1995; IV Piggyback:450] Out: 2774 [Urine:1450] Intake/Output from this shift: Total I/O In: 555 [P.O.:240; I.V.:315] Out: 1 [Stool:1]  Physical Exam: Neck: no adenopathy, no carotid bruit, no JVD and supple, symmetrical, trachea midline Lungs: decreased breath sound at bases Heart: regular rate and rhythm, regularly irregular rhythm and soft systolic murmur noted Abdomen: soft, non-tender; bowel sounds normal; no masses,  no organomegaly Extremities: extremities normal, atraumatic, no cyanosis or edema  Lab Results:  Recent Labs  06/13/17 0214 06/14/17 0210  WBC 12.5* 11.0*  HGB 9.4* 8.3*  PLT 238 239    Recent Labs  06/14/17 0210 06/14/17 0820  NA 143 144  K 2.6* 3.2*  CL 124* 122*  CO2 9* 10*  GLUCOSE 102* 113*  BUN 90* 83*  CREATININE 4.28* 4.19*    Recent Labs  06/13/17 2034 06/14/17 0210  TROPONINI 25.47* 25.28*   Hepatic Function Panel No results for input(s): PROT, ALBUMIN, AST, ALT, ALKPHOS, BILITOT, BILIDIR, IBILI in the last 72 hours.  Recent Labs  06/13/17 0214  CHOL 96   No results for input(s): PROTIME in the last 72 hours.  Imaging: Imaging results have been reviewed and No results found.  Cardiac Studies:  Assessment/Plan:  Acute inferolateral wall myocardial infarction status post PCI to distal RCA Multivessel CAD history of MI in the past Ischemic  cardiomyopathy Hypertension Hyperlipidemia History of paroxysmal A. 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 History of bilateral hydronephrosis in the past History of kidney stones in the past Acute on chronic blood loss anemia secondary to blood loss during the procedure/hematuria/hydration Hypokalemia Plan Replace K Check labs Transfer to telemetry PT consult We'll start beta blockers and BiDil as blood pressure tolerates  check 2-D echo  LOS: 2 days    Charolette Forward 06/14/2017, 12:11 PM

## 2017-06-14 NOTE — Progress Notes (Signed)
CARDIAC REHAB PHASE I   PRE:  Rate/Rhythm: 22 SR with PACs/PVCs    BP: sitting 103/68    SaO2: 98 RA  MODE:  Ambulation: to recliner, BSC then recliner again   POST:  Rate/Rhythm: 110 ST on BSC    BP: sitting 101/85     SaO2: 100 RA  Upon my arrival pt in bed, c/o abdominal and rectal pain. Pt wanted to walk tomorrow. Discovered that pt had small BM and is severely constipated/impacted. RN assisted in clean up and pt pivoted to recliner. Pt tried to sit too early in recliner, discussed importance of taking steps close to recliner and using arms to quide him. He struggles to rationalize at times and his wife sts he has been confused since admit and sometimes thinks he is in jail. Pt moved to [facility] for a while but only produced small amount of liquid. Moved back to recliner although he has to sit on his side due to rectal pain. Pt has PACS/PVCs. Left MI book and stent card. Will f/u tomorrow, he will be x2 assist and would benefit from PT c/s due to considerable weakness (was in bed 3 days before admit).   [id number]  Darren Carr, ACSM 06/14/2017 11:30 AM

## 2017-06-14 NOTE — Care Management Note (Addendum)
Case Management Note  Patient Details  Name: Darren Carr MRN: [id number] Date of Birth: 29-Dec-1950  Subjective/Objective:  From home with wife and son, pta indep, he has medication coverage and a PCP.  He is s/p coronary stent intervention, will be on brilinta.   NCM gave patient the $5 coupon savings card, and his insurance per benefit check pays 100%.  Per wife their preferred pharmacy is [facility] , but they do not have in stock , so wife states she will go to [facility] in [place] and they do have in stock.  Awaiting pt eval.                      Action/Plan: S/p coronary stent, has the $5 savings coupon, will have assist from wife and son at discharge and awaiting recs from pt eval..    Expected Discharge Date:  06/17/17               Expected Discharge Plan:  Home/Self Care  In-House Referral:     Discharge planning Services  CM Consult  Post Acute Care Choice:    Choice offered to:     DME Arranged:    DME Agency:     HH Arranged:    [place] Agency:     Status of Service:  Completed, signed off  If discussed at [employer] of Stay Meetings, dates discussed:    Additional Comments:  Zenon Mayo, RN 06/14/2017, 2:13 PM

## 2017-06-14 NOTE — Plan of Care (Signed)
Problem: Safety: Goal: Ability to remain free from injury will improve Outcome: Progressing Continue to emphasize the need to call before attempting to get up.Family is at the bedside and acknowledges understanding of safety concerns and is helping to reinforce the concept.

## 2017-06-14 NOTE — Progress Notes (Signed)
Pt sitting up in bed watching TV.  PT AOx4, NAD. Pt aware of surroundings and continues to refuse all medications regardless of consequences.  Multiple attempts made to have pt take medication. Wife is at bedside and continues to encourage pt to take medications but refuses. Charge RN notified of my concern.

## 2017-06-14 NOTE — Progress Notes (Signed)
6. S/W PATRICIA @ [employer]  # [phone]    BRILINTA  90 MG BID   COVER- YES  CO-PAY - INS WILL PAY @ 100 %  PRIOR APRROVAL- NO   DEDUCTIBLE MET- YES  OUT OF PACKET MET- YES   PHARMACY : ANY RETAIL

## 2017-06-14 NOTE — Progress Notes (Signed)
Pharmacist Heart Failure Core Measure Documentation  Assessment: Darren Carr has an EF documented as 25-30% on 08/19/17 by Echo.  Rationale: Heart failure patients with left ventricular systolic dysfunction (LVSD) and an EF < 40% should be prescribed an angiotensin converting enzyme inhibitor (ACEI) or angiotensin receptor blocker (ARB) at discharge unless a contraindication is documented in the medical record.  This patient is not currently on an ACEI or ARB for HF.  This note is being placed in the record in order to provide documentation that a contraindication to the use of these agents is present for this encounter.  ACE Inhibitor or Angiotensin Receptor Blocker is contraindicated (specify all that apply)  []   ACEI allergy AND ARB allergy []   Angioedema []   Moderate or severe aortic stenosis []   Hyperkalemia []   Hypotension []   Renal artery stenosis [x]   Worsening renal function, preexisting renal disease or dysfunction  Hildred Laser, Pharm D 06/14/2017 2:22 PM

## 2017-06-15 ENCOUNTER — Encounter (HOSPITAL_COMMUNITY): Payer: Self-pay | Admitting: Cardiology

## 2017-06-15 ENCOUNTER — Inpatient Hospital Stay (HOSPITAL_COMMUNITY): Payer: BLUE CROSS/BLUE SHIELD

## 2017-06-15 DIAGNOSIS — I340 Nonrheumatic mitral (valve) insufficiency: Secondary | ICD-10-CM

## 2017-06-15 LAB — COMPREHENSIVE METABOLIC PANEL
ALBUMIN: 2.2 g/dL — AB (ref 3.5–5.0)
ALT: 32 U/L (ref 17–63)
ANION GAP: 14 (ref 5–15)
AST: 37 U/L (ref 15–41)
Alkaline Phosphatase: 81 U/L (ref 38–126)
BUN: 61 mg/dL — AB (ref 6–20)
CHLORIDE: 110 mmol/L (ref 101–111)
CO2: 14 mmol/L — AB (ref 22–32)
CREATININE: 3.06 mg/dL — AB (ref 0.61–1.24)
Calcium: 6.5 mg/dL — ABNORMAL LOW (ref 8.9–10.3)
GFR calc non Af Amer: 20 mL/min — ABNORMAL LOW (ref >60)
GFR, EST AFRICAN AMERICAN: 23 mL/min — AB (ref >60)
GLUCOSE: 107 mg/dL — AB (ref 65–99)
Potassium: 3.2 mmol/L — ABNORMAL LOW (ref 3.5–5.1)
SODIUM: 138 mmol/L (ref 135–145)
Total Bilirubin: 0.6 mg/dL (ref 0.3–1.2)
Total Protein: 5.4 g/dL — ABNORMAL LOW (ref 6.5–8.1)

## 2017-06-15 LAB — CBC
HEMATOCRIT: 25.1 % — AB (ref 39.0–52.0)
HEMATOCRIT: 27.1 % — AB (ref 39.0–52.0)
HEMOGLOBIN: 9 g/dL — AB (ref 13.0–17.0)
Hemoglobin: 8.6 g/dL — ABNORMAL LOW (ref 13.0–17.0)
MCH: 27.7 pg (ref 26.0–34.0)
MCH: 27.4 pg (ref 26.0–34.0)
MCHC: 34.3 g/dL (ref 30.0–36.0)
MCHC: 33.2 g/dL (ref 30.0–36.0)
MCV: 81 fL (ref 78.0–100.0)
MCV: 82.6 fL (ref 78.0–100.0)
Platelets: 232 10*3/uL (ref 150–400)
Platelets: 213 10*3/uL (ref 150–400)
RBC: 3.1 MIL/uL — ABNORMAL LOW (ref 4.22–5.81)
RBC: 3.28 MIL/uL — ABNORMAL LOW (ref 4.22–5.81)
RDW: 16.4 % — ABNORMAL HIGH (ref 11.5–15.5)
RDW: 16.6 % — AB (ref 11.5–15.5)
WBC: 11.6 10*3/uL — AB (ref 4.0–10.5)
WBC: 10 10*3/uL (ref 4.0–10.5)

## 2017-06-15 LAB — BLOOD GAS, ARTERIAL
ACID-BASE DEFICIT: 16.8 mmol/L — AB (ref 0.0–2.0)
BICARBONATE: 8.3 mmol/L — AB (ref 20.0–28.0)
DRAWN BY: 252031
FIO2: 100
O2 Saturation: 99.1 %
PATIENT TEMPERATURE: 98.6
PH ART: 7.33 — AB (ref 7.350–7.450)
pO2, Arterial: 229 mmHg — ABNORMAL HIGH (ref 83.0–108.0)

## 2017-06-15 LAB — GLUCOSE, CAPILLARY: GLUCOSE-CAPILLARY: 102 mg/dL — AB (ref 65–99)

## 2017-06-15 LAB — TROPONIN I
Troponin I: 21.84 ng/mL (ref <0.03)
Troponin I: 15.49 ng/mL (ref <0.03)

## 2017-06-15 LAB — BASIC METABOLIC PANEL
Anion gap: 9 (ref 5–15)
BUN: 75 mg/dL — ABNORMAL HIGH (ref 6–20)
CALCIUM: 7 mg/dL — AB (ref 8.9–10.3)
CO2: 14 mmol/L — ABNORMAL LOW (ref 22–32)
CREATININE: 3.61 mg/dL — AB (ref 0.61–1.24)
Chloride: 120 mmol/L — ABNORMAL HIGH (ref 101–111)
GFR calc Af Amer: 19 mL/min — ABNORMAL LOW (ref >60)
GFR, EST NON AFRICAN AMERICAN: 16 mL/min — AB (ref >60)
Glucose, Bld: 111 mg/dL — ABNORMAL HIGH (ref 65–99)
POTASSIUM: 3 mmol/L — AB (ref 3.5–5.1)
SODIUM: 143 mmol/L (ref 135–145)

## 2017-06-15 LAB — ECHOCARDIOGRAM COMPLETE
HEIGHTINCHES: 70 in
Weight: 2400 oz

## 2017-06-15 LAB — MAGNESIUM: Magnesium: 1.7 mg/dL (ref 1.7–2.4)

## 2017-06-15 MED ORDER — POTASSIUM CHLORIDE 10 MEQ/100ML IV SOLN
10.0000 meq | INTRAVENOUS | Status: AC
  Administered 2017-06-16 (×3): 10 meq via INTRAVENOUS
  Filled 2017-06-15 (×3): qty 100

## 2017-06-15 MED ORDER — POTASSIUM CHLORIDE CRYS ER 20 MEQ PO TBCR
40.0000 meq | EXTENDED_RELEASE_TABLET | Freq: Once | ORAL | Status: AC
  Administered 2017-06-15: 40 meq via ORAL
  Filled 2017-06-15: qty 2

## 2017-06-15 MED ORDER — NOREPINEPHRINE BITARTRATE 1 MG/ML IV SOLN
0.0000 ug/min | INTRAVENOUS | Status: DC
  Filled 2017-06-15: qty 4

## 2017-06-15 MED ORDER — DOCUSATE SODIUM 100 MG PO CAPS: 100.0000 mg | ORAL_CAPSULE | Freq: Two times a day (BID) | ORAL | Status: DC

## 2017-06-15 MED ORDER — AMIODARONE HCL 200 MG PO TABS: 200.0000 mg | ORAL_TABLET | Freq: Two times a day (BID) | ORAL | Status: DC

## 2017-06-15 MED ORDER — DOCUSATE SODIUM 100 MG PO CAPS
100.0000 mg | ORAL_CAPSULE | Freq: Two times a day (BID) | ORAL | Status: DC
  Administered 2017-06-16 – 2017-06-19 (×7): 100 mg via ORAL
  Filled 2017-06-15 (×10): qty 1

## 2017-06-15 NOTE — Progress Notes (Signed)
  Echocardiogram 2D Echocardiogram has been performed.  Darren Carr 06/15/2017, 1:16 PM

## 2017-06-15 NOTE — Progress Notes (Signed)
Subjective:  Patient denies any chest pain or shortness of breath. Blood pressure remains marginal. 2-D echo still pending. Renal function slowly improving.  Objective:  Vital Signs in the last 24 hours: Temp:  [97.6 F (36.4 C)-99.1 F (37.3 C)] 98.3 F (36.8 C) (10/02 0744) Pulse Rate:  [31-108] 51 (10/02 0700) Resp:  [13-27] 19 (10/02 0700) BP: (92-108)/(54-85) 96/64 (10/02 0400) SpO2:  [95 %-100 %] 100 % (10/02 0700) Weight:  [66.2 kg (145 lb 15.1 oz)] 66.2 kg (145 lb 15.1 oz) (10/02 0400)  Intake/Output from previous day: 10/01 0701 - 10/02 0700 In: 2695 [P.O.:480; I.V.:2215] Out: 3402 [Urine:3400; Stool:2] Intake/Output from this shift: No intake/output data recorded.  Physical Exam: Neck: no adenopathy, no carotid bruit, no JVD and supple, symmetrical, trachea midline Lungs: decreased breath sound at bases Heart: regular rate and rhythm, S1, S2 normal and systolic murmur noted Abdomen: soft, non-tender; bowel sounds normal; no masses,  no organomegaly Extremities: extremities normal, atraumatic, no cyanosis or edema  Lab Results:  Recent Labs  06/14/17 0210 06/15/17 0155  WBC 11.0* 11.6*  HGB 8.3* 8.6*  PLT 239 232    Recent Labs  06/14/17 0820 06/15/17 0155  NA 144 143  K 3.2* 3.0*  CL 122* 120*  CO2 10* 14*  GLUCOSE 113* 111*  BUN 83* 75*  CREATININE 4.19* 3.61*    Recent Labs  06/14/17 0210 06/15/17 0155  TROPONINI 25.28* 21.84*   Hepatic Function Panel No results for input(s): PROT, ALBUMIN, AST, ALT, ALKPHOS, BILITOT, BILIDIR, IBILI in the last 72 hours.  Recent Labs  06/13/17 0214  CHOL 96   No results for input(s): PROTIME in the last 72 hours.  Imaging: Imaging results have been reviewed and No results found.  Cardiac Studies:  Assessment/Plan:  Status postAcute inferolateral wall myocardial infarction status post PCI to distal RCA Multivessel CAD history of MI in the past Ischemic  cardiomyopathy Hypertension Hyperlipidemia History of paroxysmal A. 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 History of bilateral hydronephrosis in the past History of kidney stones in the past Acute on chronic blood loss anemia secondary to blood loss during the procedure/hematuria/hydration Hypokalemia Plan Patient encouraged to take medications regularly Check 2-D echo Replace K Check labs in a.m. Increase ambulation as tolerated Transfer to telemetry  LOS: 3 days    Darren Carr 06/15/2017, 7:55 AM

## 2017-06-15 NOTE — Progress Notes (Signed)
Pt working with PT. Will f/u tomorrow. Yves Dill CES, ACSM 1:55 PM 06/15/2017

## 2017-06-15 NOTE — Progress Notes (Signed)
Subjective:  Patientad sustained VT subsequently degenerated into V. Fib  ACLS protocol was followed and successfully converted to sinus rhythm.patient presently complains of rib pains. Remains hypotensive blood pressure in 60s receiving IV fluid bolus and was started on levofed. 2-D echo done earlier showed EF approximately 30%. EKG done post CPR showed no new acute ischemic changes troponin I is trending down. Renal function is improving slowly improving K remains low despite receiving 80 mEq today. Will replace IV 3 runs.  Objective:  Vital Signs in the last 24 hours: Temp:  [98.1 F (36.7 C)-99.1 F (37.3 C)] 98.1 F (36.7 C) (10/02 1951) Pulse Rate:  [37-70] 37 (10/02 1951) Resp:  [13-23] 18 (10/02 1951) BP: (95-100)/(57-70) 95/57 (10/02 1951) SpO2:  [88 %-100 %] 100 % (10/02 1951) Weight:  [66.2 kg (145 lb 15.1 oz)-68 kg (150 lb)] 68 kg (150 lb) (10/02 1036)  Intake/Output from previous day: 10/01 0701 - 10/02 0700 In: 2695 [P.O.:480; I.V.:2215] Out: 3402 [Urine:3400; Stool:2] Intake/Output from this shift: Total I/O In: -  Out: 900 [Urine:900]  Physical Exam: Neck: no adenopathy, no carotid bruit, no JVD and supple, symmetrical, trachea midline Lungs: decreased breath sound at bases clear anteriorly Heart: regular rate and rhythm, S1, S2 normal and soft systolic murmur noted Abdomen: soft, non-tender; bowel sounds normal; no masses,  no organomegaly Extremities: extremities normal, atraumatic, no cyanosis or edema  Lab Results:  Recent Labs  06/15/17 0155 06/15/17 2126  WBC 11.6* PENDING  HGB 8.6* 9.0*  PLT 232 213    Recent Labs  06/15/17 0155 06/15/17 2126  NA 143 138  K 3.0* 3.2*  CL 120* 110  CO2 14* 14*  GLUCOSE 111* 107*  BUN 75* 61*  CREATININE 3.61* 3.06*    Recent Labs  06/15/17 0155 06/15/17 2126  TROPONINI 21.84* 15.49*   Hepatic Function Panel  Recent Labs  06/15/17 2126  PROT 5.4*  ALBUMIN 2.2*  AST 37  ALT 32  ALKPHOS 81   BILITOT 0.6    Recent Labs  06/13/17 0214  CHOL 96   No results for input(s): PROTIME in the last 72 hours.  Imaging: Imaging results have been reviewed and Dg Chest Port 1 View  Result Date: 06/15/2017 CLINICAL DATA:  Left arm pain EXAM: PORTABLE CHEST 1 VIEW COMPARISON:  06/12/2017, CT 02/24/2017 FINDINGS: Minimal bibasilar atelectasis. No consolidation or pleural effusion. Cardiomediastinal silhouette upper normal, likely augmented by portable technique. No pneumothorax. IMPRESSION: Borderline cardiomegaly.  Minimal bibasilar atelectasis. Electronically Signed   By: Donavan Foil M.D.   On: 06/15/2017 22:07    Cardiac Studies:  Assessment/Plan:  Status post VT/V. Fib cardiac arrest Cardiogenic shock Status postAcute inferolateral wall myocardial infarction status post PCI to distal RCA Multivessel CAD history of MI in the past Ischemic cardiomyopathy Hypertension Hyperlipidemia History of paroxysmal A. 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 EtOH abuse Tobacco abuse High-grade metastatic bladder cancer history of multiple urological surgeries. History of bilateral hydronephrosis in the past History of kidney stones in the past Acute on chronic blood loss anemia secondary to blood loss during the procedure/hematuria/hydration Hypokalemia Plan Check serial enzymes and EKG Increase amiodarone to 200 mg by mouth twice a day IV fluid bolus 500 mL Wean off levofed as blood pressure tolerates KCl runs 10 mEq IV 3 Check labs in a.m.  LOS: 3 days    Charolette Forward 06/15/2017, 11:01  PM

## 2017-06-15 NOTE — Evaluation (Signed)
Physical Therapy Evaluation Patient Details Name: Darren Carr MRN: [id number] DOB: 06-22-1951 Today's Date: 06/15/2017   History of Present Illness  Patient is 66 year old male with past medical history significant for coronary artery disease history of anterolateral wall myocardial infarction in the past, hypertension, hyperlipidemia, history of EtOH abuse, tobacco abuse, history of bladder cancer status post radical cystoprostatectomy with bilateral pelvic lymphadenopathy and ileal loop conduit for urinary driversion and recently implantation of the ureter to the conduit, history of paroxysmal A. fib chads vasc score of 2, history of GI bleed not a candidate  for chronic anticoagulation came to the ER complaining of recurrent chest pain for last 2-3 days associated with shortness of breath feeling weak tired and no energy. Did not seek any medical attention initially but as pain got worse so decided to come to the ED EKG done in the ED her showed normal sinus rhythm with ST elevation in inferolateral leads which were new as compared to prior EKG and code STEMI was called. Patient was recently discharged from the hospital and had negative nuclear stress test approximately 10 days ago.  Clinical Impression  Pt admitted with above diagnosis. Pt currently with functional limitations due to the deficits listed below (see PT Problem List). Pt limited in activity today by weakness, fatigue and diarrhea. Pt currently minA for bed mobility, minAx 2 for transfers and minA for ambulation of 2 feet with RW.  Pt will benefit from skilled PT to increase their independence and safety with mobility to allow discharge to the venue listed below.       Follow Up Recommendations Home health PT;Supervision/Assistance - 24 hour    Equipment Recommendations  Rolling walker with 5" wheels;3in1 (PT)    Recommendations for Other Services       Precautions / Restrictions Precautions Precautions:  None Restrictions Weight Bearing Restrictions: No      Mobility  Bed Mobility Overal bed mobility: Needs Assistance Bed Mobility: Supine to Sit     Supine to sit: Min assist     General bed mobility comments: miA for trunk to upright and pad scoot of hips to EoB  Transfers Overall transfer level: Needs assistance Equipment used: Rolling walker (2 wheeled) Transfers: Sit to/from Stand Sit to Stand: Min assist;+2 physical assistance         General transfer comment: minAx2 for power up and steadying with RW  Ambulation/Gait Ambulation/Gait assistance: Min assist;+2 physical assistance Ambulation Distance (Feet): 2 Feet Assistive device: Rolling walker (2 wheeled) Gait Pattern/deviations: Step-to pattern;Decreased step length - right;Decreased step length - left;Shuffle Gait velocity: slowed Gait velocity interpretation: Below normal speed for age/gender General Gait Details: able to take shuffling side steps from bed to [facility], and BSC to recliner, minAx2 for steadying with movement     Balance Overall balance assessment: Needs assistance Sitting-balance support: Feet supported;No upper extremity supported Sitting balance-Leahy Scale: Good     Standing balance support: Bilateral upper extremity supported Standing balance-Leahy Scale: Poor Standing balance comment: requires UE support on RW for balance                             Pertinent Vitals/Pain Pain Assessment: 0-10 Pain Score: 6  Pain Location: Low Back  Pain Descriptors / Indicators: Aching;Burning;Constant Pain Intervention(s): Limited activity within patient's tolerance;Monitored during session;Repositioned    Home Living Family/patient expects to be discharged to:: Private residence Living Arrangements: Spouse/significant other Available Help at Discharge: Family;Available 24 hours/day  Type of Home: House Home Access: Stairs to enter Entrance Stairs-Rails: None Entrance Stairs-Number of  Steps: 2 Home Layout: One level;Laundry or work area in [employer]: None      Prior Function Level of Independence: Independent                  Extremity/Trunk Assessment   Upper Extremity Assessment Upper Extremity Assessment: Generalized weakness    Lower Extremity Assessment Lower Extremity Assessment: Generalized weakness       Communication   Communication: No difficulties  Cognition Arousal/Alertness: Awake/alert Behavior During Therapy: WFL for tasks assessed/performed Overall Cognitive Status: Within Functional Limits for tasks assessed                                        General Comments General comments (skin integrity, edema, etc.): prior to activity BP 93/71, SaO2 on RA 100%O2, HR 63 bpm, after activity BP 94/71, SaO2 on RA 100%, HR 64 bpm        Assessment/Plan    PT Assessment Patient needs continued PT services  PT Problem List Decreased strength;Decreased activity tolerance;Decreased balance;Decreased mobility;Cardiopulmonary status limiting activity;Pain       PT Treatment Interventions DME instruction;Gait training;Stair training;Functional mobility training;Therapeutic activities;Therapeutic exercise;Balance training;Patient/family education    PT Goals (Current goals can be found in the Care Plan section)  Acute Rehab PT Goals Patient Stated Goal: stop coming to the hospital  PT Goal Formulation: With patient Time For Goal Achievement: 06/29/17 Potential to Achieve Goals: Fair    Frequency Min 3X/week    AM-PAC PT "6 Clicks" Daily Activity  Outcome Measure Difficulty turning over in bed (including adjusting bedclothes, sheets and blankets)?: A Lot Difficulty moving from lying on back to sitting on the side of the bed? : Unable Difficulty sitting down on and standing up from a chair with arms (e.g., wheelchair, bedside commode, etc,.)?: Unable Help needed moving to and from a bed to chair (including a  wheelchair)?: A Little Help needed walking in hospital room?: A Lot Help needed climbing 3-5 steps with a railing? : Total 6 Click Score: 10    End of Session Equipment Utilized During Treatment: Gait belt Activity Tolerance: Patient limited by fatigue;Patient limited by lethargy;Other (comment) (limited by diarrhea) Patient left: in chair;with call bell/phone within reach;with chair alarm set Nurse Communication: Mobility status PT Visit Diagnosis: Unsteadiness on feet (R26.81);Other abnormalities of gait and mobility (R26.89);Muscle weakness (generalized) (M62.81);Difficulty in walking, not elsewhere classified (R26.2);Pain Pain - part of body:  (low back)    Time: 1341-1415 PT Time Calculation (min) (ACUTE ONLY): 34 min   Charges:   PT Evaluation $PT Eval Moderate Complexity: 1 Mod PT Treatments $Therapeutic Activity: 8-22 mins   PT G Codes:        Lacie Landry B. Migdalia Dk PT, DPT Acute Rehabilitation  [phone] Pager [phone]    [place] 06/15/2017, 2:43 PM

## 2017-06-15 NOTE — Progress Notes (Signed)
  Patient Name: Darren Carr   MRN: [id number]   Date of Birth/ Sex: 18-Apr-1951 , male      Admission Date: 06/12/2017  Attending Provider: Charolette Forward, MD  Primary Diagnosis: <principal problem not specified>   Indication: Pt was in his usual state of health until this PM, when he was noted to be unresponsive and apneic. Code blue was subsequently called. At the time of arrival on scene, ACLS protocol was underway. He was found to be in polymorphic V-tach and started on Mg.   Technical Description:  - CPR performance duration:  30 minutes  - Was defibrillation or cardioversion used? Yes   - Was external pacer placed? No  - Was patient intubated pre/post CPR? No   Medications Administered: Y = Yes; Blank = No Amiodarone  Y  Atropine    Calcium    Epinephrine  Y  Lidocaine    Magnesium  Y  Norepinephrine    Phenylephrine    Sodium bicarbonate    Vasopressin     Post CPR evaluation:  - Final Status - Was patient successfully resuscitated ? Yes - What is current rhythm? Sinus Tach  - What is current hemodynamic status? Stable  Miscellaneous Information:  - Labs sent, including: ABG, CMP, Mg, CBC, Troponin  - Primary team notified?  Yes  - Family Notified? Yes  - Additional notes/ transfer status: Transferred to 79M, stepdown     Ina Homes, MD  06/15/2017, 10:02 PM

## 2017-06-16 DIAGNOSIS — I200 Unstable angina: Secondary | ICD-10-CM

## 2017-06-16 DIAGNOSIS — I2111 ST elevation (STEMI) myocardial infarction involving right coronary artery: Secondary | ICD-10-CM

## 2017-06-16 DIAGNOSIS — I2119 ST elevation (STEMI) myocardial infarction involving other coronary artery of inferior wall: Principal | ICD-10-CM

## 2017-06-16 LAB — CBC
HCT: 21.7 % — ABNORMAL LOW (ref 39.0–52.0)
HCT: 26.8 % — ABNORMAL LOW (ref 39.0–52.0)
HEMOGLOBIN: 7.7 g/dL — AB (ref 13.0–17.0)
HEMOGLOBIN: 9 g/dL — AB (ref 13.0–17.0)
MCH: 28.6 pg (ref 26.0–34.0)
MCH: 27.5 pg (ref 26.0–34.0)
MCHC: 35.5 g/dL (ref 30.0–36.0)
MCHC: 33.6 g/dL (ref 30.0–36.0)
MCV: 80.7 fL (ref 78.0–100.0)
MCV: 82 fL (ref 78.0–100.0)
PLATELETS: 181 10*3/uL (ref 150–400)
Platelets: 192 10*3/uL (ref 150–400)
RBC: 2.69 MIL/uL — ABNORMAL LOW (ref 4.22–5.81)
RBC: 3.27 MIL/uL — ABNORMAL LOW (ref 4.22–5.81)
RDW: 16.8 % — AB (ref 11.5–15.5)
RDW: 16.1 % — AB (ref 11.5–15.5)
WBC: 11.3 10*3/uL — AB (ref 4.0–10.5)
WBC: 11.3 10*3/uL — ABNORMAL HIGH (ref 4.0–10.5)

## 2017-06-16 LAB — GLUCOSE, CAPILLARY
GLUCOSE-CAPILLARY: 166 mg/dL — AB (ref 65–99)
GLUCOSE-CAPILLARY: 172 mg/dL — AB (ref 65–99)
GLUCOSE-CAPILLARY: 192 mg/dL — AB (ref 65–99)
GLUCOSE-CAPILLARY: 196 mg/dL — AB (ref 65–99)

## 2017-06-16 LAB — BASIC METABOLIC PANEL
ANION GAP: 12 (ref 5–15)
Anion gap: 11 (ref 5–15)
BUN: 58 mg/dL — ABNORMAL HIGH (ref 6–20)
BUN: 55 mg/dL — ABNORMAL HIGH (ref 6–20)
CALCIUM: 6.1 mg/dL — AB (ref 8.9–10.3)
CALCIUM: 6.6 mg/dL — AB (ref 8.9–10.3)
CHLORIDE: 108 mmol/L (ref 101–111)
CO2: 15 mmol/L — ABNORMAL LOW (ref 22–32)
CO2: 17 mmol/L — ABNORMAL LOW (ref 22–32)
CREATININE: 2.97 mg/dL — AB (ref 0.61–1.24)
Chloride: 107 mmol/L (ref 101–111)
Creatinine, Ser: 2.89 mg/dL — ABNORMAL HIGH (ref 0.61–1.24)
GFR calc Af Amer: 25 mL/min — ABNORMAL LOW (ref >60)
GFR, EST AFRICAN AMERICAN: 24 mL/min — AB (ref >60)
GFR, EST NON AFRICAN AMERICAN: 21 mL/min — AB (ref >60)
GFR, EST NON AFRICAN AMERICAN: 21 mL/min — AB (ref >60)
Glucose, Bld: 171 mg/dL — ABNORMAL HIGH (ref 65–99)
Glucose, Bld: 112 mg/dL — ABNORMAL HIGH (ref 65–99)
POTASSIUM: 2.8 mmol/L — AB (ref 3.5–5.1)
Potassium: 3 mmol/L — ABNORMAL LOW (ref 3.5–5.1)
SODIUM: 134 mmol/L — AB (ref 135–145)
SODIUM: 136 mmol/L (ref 135–145)

## 2017-06-16 LAB — PREPARE RBC (CROSSMATCH)

## 2017-06-16 LAB — TROPONIN I: TROPONIN I: 17.54 ng/mL — AB (ref <0.03)

## 2017-06-16 LAB — ABO/RH: ABO/RH(D): O POS

## 2017-06-16 MED ORDER — AMIODARONE HCL IN DEXTROSE 360-4.14 MG/200ML-% IV SOLN: 30.0000 mg/h | INTRAVENOUS | Status: DC

## 2017-06-16 MED ORDER — STERILE WATER FOR INJECTION IJ SOLN
INTRAMUSCULAR | Status: AC
  Administered 2017-06-16: 1 mL
  Filled 2017-06-16: qty 10

## 2017-06-16 MED ORDER — POTASSIUM CHLORIDE 10 MEQ/100ML IV SOLN
10.0000 meq | INTRAVENOUS | Status: AC
  Administered 2017-06-16 – 2017-06-17 (×4): 10 meq via INTRAVENOUS
  Filled 2017-06-16 (×3): qty 100

## 2017-06-16 MED ORDER — POTASSIUM CHLORIDE CRYS ER 20 MEQ PO TBCR
40.0000 meq | EXTENDED_RELEASE_TABLET | Freq: Once | ORAL | Status: AC
  Administered 2017-06-16: 40 meq via ORAL
  Filled 2017-06-16: qty 2

## 2017-06-16 MED ORDER — SODIUM CHLORIDE 0.9 % IV SOLN
2.0000 g | Freq: Once | INTRAVENOUS | Status: AC
  Administered 2017-06-16: 2 g via INTRAVENOUS
  Filled 2017-06-16: qty 20

## 2017-06-16 MED ORDER — FENTANYL CITRATE (PF) 100 MCG/2ML IJ SOLN
50.0000 ug | Freq: Once | INTRAMUSCULAR | Status: AC
  Administered 2017-06-16: 50 ug via INTRAVENOUS

## 2017-06-16 MED ORDER — MAGNESIUM SULFATE 2 GM/50ML IV SOLN
2.0000 g | Freq: Once | INTRAVENOUS | Status: AC
Start: 2017-06-16 — End: 2017-06-17
  Administered 2017-06-17: 2 g via INTRAVENOUS
  Filled 2017-06-16: qty 50

## 2017-06-16 MED ORDER — FENTANYL CITRATE (PF) 100 MCG/2ML IJ SOLN
INTRAMUSCULAR | Status: AC
  Filled 2017-06-16: qty 2

## 2017-06-16 MED ORDER — MORPHINE SULFATE (PF) 2 MG/ML IV SOLN: 1.0000 mg | Freq: Once | INTRAVENOUS | Status: AC

## 2017-06-16 MED ORDER — SODIUM CHLORIDE 0.9 % IV SOLN
2.0000 g | Freq: Once | INTRAVENOUS | Status: AC
  Administered 2017-06-17: 2 g via INTRAVENOUS
  Filled 2017-06-16 (×2): qty 20

## 2017-06-16 MED ORDER — MORPHINE SULFATE (PF) 2 MG/ML IV SOLN
INTRAVENOUS | Status: AC
  Administered 2017-06-16: 1 mg via INTRAVENOUS
  Filled 2017-06-16: qty 1

## 2017-06-16 MED ORDER — SODIUM CHLORIDE 0.9 % IV BOLUS (SEPSIS)
250.0000 mL | Freq: Once | INTRAVENOUS | Status: AC
  Administered 2017-06-16: 250 mL via INTRAVENOUS

## 2017-06-16 MED ORDER — AMIODARONE HCL IN DEXTROSE 360-4.14 MG/200ML-% IV SOLN: 60.0000 mg/h | INTRAVENOUS | Status: DC

## 2017-06-16 MED ORDER — NOREPINEPHRINE BITARTRATE 1 MG/ML IV SOLN
0.0000 ug/min | INTRAVENOUS | Status: DC
  Filled 2017-06-16: qty 4

## 2017-06-16 MED ORDER — AMIODARONE HCL 200 MG PO TABS
200.0000 mg | ORAL_TABLET | Freq: Two times a day (BID) | ORAL | Status: DC
  Administered 2017-06-16 (×3): 200 mg via ORAL
  Filled 2017-06-16 (×3): qty 1

## 2017-06-16 MED ORDER — POTASSIUM CHLORIDE 10 MEQ/100ML IV SOLN
10.0000 meq | INTRAVENOUS | Status: AC
  Administered 2017-06-16 (×3): 10 meq via INTRAVENOUS
  Filled 2017-06-16 (×2): qty 100

## 2017-06-16 MED ORDER — PHENTOLAMINE MESYLATE 5 MG IJ SOLR
5.0000 mg | Freq: Once | INTRAMUSCULAR | Status: AC
  Administered 2017-06-16: 5 mg via SUBCUTANEOUS
  Filled 2017-06-16: qty 5

## 2017-06-16 MED ORDER — MAGNESIUM SULFATE 2 GM/50ML IV SOLN
2.0000 g | Freq: Once | INTRAVENOUS | Status: AC
  Administered 2017-06-17: 2 g via INTRAVENOUS
  Filled 2017-06-16: qty 50

## 2017-06-16 MED ORDER — SODIUM CHLORIDE 0.9 % IV SOLN
1.0000 g | Freq: Once | INTRAVENOUS | Status: AC
  Administered 2017-06-16: 1 g via INTRAVENOUS
  Filled 2017-06-16: qty 10

## 2017-06-16 MED ORDER — PANTOPRAZOLE SODIUM 40 MG PO TBEC
40.0000 mg | DELAYED_RELEASE_TABLET | Freq: Every day | ORAL | Status: DC
  Administered 2017-06-17 – 2017-06-21 (×5): 40 mg via ORAL
  Filled 2017-06-16 (×5): qty 1

## 2017-06-16 MED ORDER — SODIUM CHLORIDE 0.9 % IV SOLN
Freq: Once | INTRAVENOUS | Status: AC
  Administered 2017-06-16: 14:00:00 via INTRAVENOUS

## 2017-06-16 MED ORDER — POTASSIUM CHLORIDE CRYS ER 20 MEQ PO TBCR
20.0000 meq | EXTENDED_RELEASE_TABLET | Freq: Two times a day (BID) | ORAL | Status: DC
  Administered 2017-06-16 – 2017-06-18 (×4): 20 meq via ORAL
  Filled 2017-06-16 (×4): qty 1

## 2017-06-16 MED ORDER — FENTANYL CITRATE (PF) 100 MCG/2ML IJ SOLN
50.0000 ug | Freq: Once | INTRAMUSCULAR | Status: AC
  Administered 2017-06-16: 50 ug via INTRAVENOUS
  Filled 2017-06-16: qty 2

## 2017-06-16 MED FILL — Medication: Qty: 1 | Status: AC

## 2017-06-16 NOTE — Progress Notes (Signed)
[facility] Progress Note Patient Name: Darren Carr DOB: Aug 29, 1951 MRN: [id number]   Date of Service  06/16/2017  HPI/Events of Note  Hypokalemia, hypocalcemia  eICU Interventions  replaced     Intervention Category Intermediate Interventions: Electrolyte abnormality - evaluation and management  Simonne Maffucci 06/16/2017, 6:35 AM

## 2017-06-16 NOTE — Code Documentation (Addendum)
CODE BLUE NOTE  Patient Name: Darren Carr   MRN: [id number]   Date of Birth/ Sex: [birth year] , male      Admission Date: 06/12/2017  Attending Provider: Charolette Forward, MD  Primary Diagnosis: <principal problem not specified>    Indication: Pt was in his usual state of health until this PM, when he was noted to be in ventricular fibrillation. Code blue was subsequently called. At the time of arrival on scene, ACLS protocol was underway.    Technical Description:  - CPR performance duration:  2 minute  - Was defibrillation or cardioversion used? Yes   - Was external pacer placed? No  - Was patient intubated pre/post CPR? No    Medications Administered: Y = Yes; Blank = No Amiodarone    Atropine    Calcium    Epinephrine    Lidocaine    Magnesium    Norepinephrine    Phenylephrine    Sodium bicarbonate    Vasopressin      Post CPR evaluation:  - Final Status - Was patient successfully resuscitated ? Yes - What is current rhythm? Sinus tachycardia - What is current hemodynamic status? stable   Miscellaneous Information:  - Labs sent, including: Mag, ABG  - Primary team notified?  Yes  - Family Notified? Yes  - Additional notes/ transfer status: Stay in ICU      Everrett Coombe, MD  06/16/2017, 10:57 PM   .. I, Dr Seward Carol Agree with the above note by Resident  Ptis a 65 yr old male with PMHx CAD, HTN, PAF and metastatic bladder CA s/p robotic cystoprostatectomy with ileal conduit with urinary diversion in June 2016, and left hydronephrosis with chronic left nephroureteral catheter that drains into his urostomy was noted to be in VFib and received appropriate Defib. Electrolytes replaced aggressively. 12 Lead EKG done and reviewed. Spoke with on call cardiology  [phone] and Dr Terrence Dupont over the phone at [phone].  Dr. Seward Carol [facility]

## 2017-06-16 NOTE — Progress Notes (Signed)
Patient with increasing frequent PVC's, went to bedside to assess patient and position his nasal cannula- while conversing with pt, he started shacking and went into rapid ventricular tachycardia/ torsade de pointe-  CPR initiated See code sheet.

## 2017-06-16 NOTE — Progress Notes (Signed)
Subjective:  No further episodes of VT.  Denies any anginal chest pain or shortness of breath.  Potassium remains low  Being replaced.  Objective:  Vital Signs in the last 24 hours: Temp:  [97.9 F (36.6 C)-98.8 F (37.1 C)] 97.9 F (36.6 C) (10/03 0806) Pulse Rate:  [37-69] 48 (10/03 0700) Resp:  [17-28] 22 (10/03 0700) BP: (56-95)/(41-77) 86/66 (10/03 0700) SpO2:  [87 %-100 %] 99 % (10/03 0700) Weight:  [68 kg (150 lb)] 68 kg (150 lb) (10/02 1036)  Intake/Output from previous day: 10/02 0701 - 10/03 0700 In: 2225.8 [I.V.:1975.8; IV Piggyback:250] Out: 1850 [Urine:1850] Intake/Output from this shift: No intake/output data recorded.  Physical Exam: Neck: no adenopathy, no carotid bruit, no JVD and supple, symmetrical, trachea midline Lungs: decreased breath sounds at bases Heart: regular rate and rhythm, S1, S2 normal and soft systolic murmur noted Abdomen: soft, non-tender; bowel sounds normal; no masses,  no organomegaly Extremities: extremities normal, atraumatic, no cyanosis or edema  Lab Results:  Recent Labs  06/15/17 2126 06/16/17 0349  WBC 10.0 11.3*  HGB 9.0* 7.7*  PLT 213 192    Recent Labs  06/15/17 2126 06/16/17 0349  NA 138 134*  K 3.2* 2.8*  CL 110 107  CO2 14* 15*  GLUCOSE 107* 171*  BUN 61* 58*  CREATININE 3.06* 2.89*    Recent Labs  06/15/17 2126 06/16/17 0349  TROPONINI 15.49* 17.54*   Hepatic Function Panel  Recent Labs  06/15/17 2126  PROT 5.4*  ALBUMIN 2.2*  AST 37  ALT 32  ALKPHOS 81  BILITOT 0.6   No results for input(s): CHOL in the last 72 hours. No results for input(s): PROTIME in the last 72 hours.  Imaging: Imaging results have been reviewed and Dg Chest Port 1 View  Result Date: 06/15/2017 CLINICAL DATA:  Left arm pain EXAM: PORTABLE CHEST 1 VIEW COMPARISON:  06/12/2017, CT 02/24/2017 FINDINGS: Minimal bibasilar atelectasis. No consolidation or pleural effusion. Cardiomediastinal silhouette upper normal, likely  augmented by portable technique. No pneumothorax. IMPRESSION: Borderline cardiomegaly.  Minimal bibasilar atelectasis. Electronically Signed   By: Donavan Foil M.D.   On: 06/15/2017 22:07    Cardiac Studies:  Assessment/Plan:  Status post VT/V. Fib cardiac arrest Cardiogenic shock Status postAcute inferolateral wall myocardial infarction status post PCI to distal RCA Multivessel CAD history of MI in the past Ischemic cardiomyopathy Hypertension Hyperlipidemia History of paroxysmal A. 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 EtOH abuse Tobacco abuse High-grade metastatic bladder cancer history of multiple urological surgeries. History of bilateral hydronephrosis in the past History of kidney stones in the past Acute on chronic blood loss anemia secondary to blood loss during the procedure/hematuria/hydration Hypokalemia Plan Type and crossmatch and transfuse one unit of packed RBC in view of significant  three-vessel disease Replace K Check stools for occult blood  LOS: 4 days    Charolette Forward 06/16/2017, 8:18 AM

## 2017-06-16 NOTE — Consult Note (Signed)
PULMONARY / CRITICAL CARE MEDICINE   Name: Darren Carr MRN: [id number] DOB: August 15, 1951    ADMISSION DATE:  06/12/2017 CONSULTATION DATE:  10/3  REFERRING MD:  Dr. Terrence Dupont  CHIEF COMPLAINT:    HISTORY OF PRESENT ILLNESS:  66 y.o.malewith history of coronary artery disease, hypertension, paroxysmal atrial fibrillation, and metastatic bladder cancer status post robotic cystoprostatectomy with lymphadenectomy and ileal conduit urinary diversion in June 2016, and left hydronephrosis with chronic left nephroureteral catheter that drains into his urostomy. He has two recent admissions. He was admitted in June 2018 for AKI on CKD and potentially UTI.  Nephroureteral stent replaced at that time. He was admitted 05/2017 for unstable angina. He did not undergo cardiac catheterization at that time.   He presented again to ED 9/29 with complaints of chest pain and was admitted for cardiac catheterization when demonstrated multi-vessel CAD most notable for 100% stenosis to RCA and 2nd diagonal. diag could not be stented and RCA received DES. In the late PM hours of 10/2 he suffered a cardiac arrest VT > VF with total duration at 10 minutes. He did not require intubation and was awake after the code. He was transferred to ICU for further monitoring and vasopresors.   PAST MEDICAL HISTORY :  He  has a past medical history of Acute MI, lateral wall ([place]) (02/23/2015); Alcoholic ([place]); Anterior wall myocardial infarction ([place]) (02/23/2015); Bladder cancer ([place]) (07/2014); Chronic back pain; Coronary artery disease; Dyspnea; GERD (gastroesophageal reflux disease); History of blood transfusion; History of kidney stones; Hypertension; Paroxysmal atrial fibrillation ([place]); and Upper GI bleed.  PAST SURGICAL HISTORY: He  has a past surgical history that includes Appendectomy; Esophagogastroduodenoscopy (egd) with propofol (N/A, 08/16/2013); Balloon dilation (N/A, 08/16/2013); biopsy (08/16/2013); Transurethral resection  of prostate (07-2014,08-2014,09-2014); Transurethral resection of bladder tumor with gyrus (turbt-gyrus) (N/A, 12/05/2014); Cystoscopy w/ ureteral stent placement (Bilateral, 12/05/2014); Robot assisted laparoscopic complete cystect ileal conduit (N/A, 01/16/2015); Lymphadenectomy (Bilateral, 01/16/2015); Cystoscopy with injection (N/A, 01/16/2015); Cardiac catheterization (N/A, 02/23/2015); Esophagogastroduodenoscopy (N/A, 02/24/2015); Coronary angioplasty; Cardiac catheterization (N/A, 04/25/2015); ir generic historical (04/29/2016); ir generic historical (06/10/2016); ir generic historical (07/15/2016); ir generic historical (08/10/2016); ir generic historical (08/11/2016); ir generic historical (09/08/2016); ir generic historical (09/08/2016); ir generic historical (10/13/2016); Nephrolithotomy (Right, 10/14/2016); Nephrolithotomy (Right, 10/16/2016); Holmium laser application (Right, 12/13/9620); ir generic historical (11/23/2016); IR Catheter Tube Change (12/28/2016); IR Catheter Tube Change (01/14/2017); IR NEPHROSTOMY EXCHANGE LEFT (02/13/2017); Ureteral reimplantion (Left, 03/26/2017); LEFT HEART CATH AND CORONARY ANGIOGRAPHY (N/A, 06/12/2017); and CORONARY STENT INTERVENTION (N/A, 06/12/2017).  No Known Allergies  No current facility-administered medications on file prior to encounter.    Current Outpatient Prescriptions on File Prior to Encounter  Medication Sig  . amiodarone (PACERONE) 200 MG tablet Take 1 tablet (200 mg total) by mouth daily.  Marland Kitchen aspirin EC 81 MG EC tablet Take 1 tablet (81 mg total) by mouth daily.  Marland Kitchen atorvastatin (LIPITOR) 40 MG tablet Take 1 tablet (40 mg total) by mouth daily at 6 PM.  . carvedilol (COREG) 3.125 MG tablet Take 1 tablet (3.125 mg total) by mouth 2 (two) times daily with a meal.  . docusate sodium (COLACE) 100 MG capsule Take 1 capsule (100 mg total) by mouth 2 (two) times daily.  . nitroGLYCERIN (NITROSTAT) 0.4 MG SL tablet Place 1 tablet (0.4 mg total) under the tongue every 5  (five) minutes x 3 doses as needed for chest pain.  . Oxycodone HCl 10 MG TABS Take 1 tablet (10 mg total) by mouth every 6 (six)  hours as needed (pain).  . pantoprazole (PROTONIX) 40 MG tablet Take 1 tablet (40 mg total) by mouth 2 (two) times daily.  Marland Kitchen senna-docusate (SENOKOT-S) 8.6-50 MG tablet Take 2 tablets by mouth at bedtime.    FAMILY HISTORY:  His indicated that his mother is deceased. He indicated that his father is deceased. He indicated that his sister is alive. He indicated that his brother is alive.    SOCIAL HISTORY: He  reports that he has been smoking Cigarettes.  He has a 49.00 pack-year smoking history. He has never used smokeless tobacco. He reports that he does not drink alcohol or use drugs.  REVIEW OF SYSTEMS:   Bolds are positive  Constitutional: weight loss, gain, night sweats, Fevers, chills, fatigue .  HEENT: headaches, Sore throat, sneezing, nasal congestion, post nasal drip, Difficulty swallowing, Tooth/dental problems, visual complaints visual changes, ear ache CV:  chest pain, atypical, worse with cough, deep breathing, palpation,Orthopnea, PND, swelling in lower extremities dizziness, palpitations, syncope.  GI  heartburn, indigestion, abdominal pain, nausea, vomiting, diarrhea, change in bowel habits, loss of appetite, bloody stools.  Resp: cough, productive:, hemoptysis, dyspnea, chest pain, pleuritic.  Skin: rash or itching or icterus GU: dysuria, change in color of urine, urgency or frequency. flank pain, hematuria  MS: joint pain or swelling. decreased range of motion  Psych: change in mood or affect. depression or anxiety.  Neuro: difficulty with speech, weakness, numbness, ataxia    SUBJECTIVE:  Chest pain/tenderness.   VITAL SIGNS: BP (!) 90/56   Pulse (!) 45   Temp 97.9 F (36.6 C) (Oral)   Resp 19   Ht 5\' 10"  (1.778 m)   Wt 68 kg (150 lb)   SpO2 98%   BMI 21.52 kg/m   HEMODYNAMICS:    VENTILATOR SETTINGS:    INTAKE /  OUTPUT: I/O last 3 completed shifts: In: 3325.8 [I.V.:3075.8; IV Piggyback:250] Out: 9798 [XQJJH:4174]  PHYSICAL EXAMINATION: General:  Adult male in NAD Neuro:  Alert, oriented, non-focal HEENT:  [place]/AT, PERRL, no JVD Cardiovascular:  RRR, no MRG Lungs:  Clear, bilateral breath sounds Abdomen:  Soft, non-tender, non-distended Musculoskeletal:  No acute deformity or ROM limitation Skin:  Grossly intact.   LABS:  BMET  Recent Labs Lab 06/15/17 0155 06/15/17 2126 06/16/17 0349  NA 143 138 134*  K 3.0* 3.2* 2.8*  CL 120* 110 107  CO2 14* 14* 15*  BUN 75* 61* 58*  CREATININE 3.61* 3.06* 2.89*  GLUCOSE 111* 107* 171*    Electrolytes  Recent Labs Lab 06/14/17 0820 06/15/17 0155 06/15/17 2126 06/16/17 0349  CALCIUM 7.3* 7.0* 6.5* 6.1*  MG 1.9  --  1.7  --     CBC  Recent Labs Lab 06/15/17 0155 06/15/17 2126 06/16/17 0349  WBC 11.6* 10.0 11.3*  HGB 8.6* 9.0* 7.7*  HCT 25.1* 27.1* 21.7*  PLT 232 213 192    Coag's  Recent Labs Lab 06/12/17 0916  APTT 27  INR 1.14    Sepsis Markers No results for input(s): LATICACIDVEN, PROCALCITON, O2SATVEN in the last 168 hours.  ABG  Recent Labs Lab 06/15/17 2144  PHART 7.330*  PCO2ART VALUE BELOW REPORTABLE RANGE  PO2ART 229*    Liver Enzymes  Recent Labs Lab 06/15/17 2126  AST 37  ALT 32  ALKPHOS 81  BILITOT 0.6  ALBUMIN 2.2*    Cardiac Enzymes  Recent Labs Lab 06/15/17 0155 06/15/17 2126 06/16/17 0349  TROPONINI 21.84* 15.49* 17.54*    Glucose  Recent Labs Lab 06/12/17  2352 06/13/17 1146 06/15/17 0743 06/16/17 0014 06/16/17 0607  GLUCAP 80 99 102* 196* 192*    Imaging Dg Chest Port 1 View  Result Date: 06/15/2017 CLINICAL DATA:  Left arm pain EXAM: PORTABLE CHEST 1 VIEW COMPARISON:  06/12/2017, CT 02/24/2017 FINDINGS: Minimal bibasilar atelectasis. No consolidation or pleural effusion. Cardiomediastinal silhouette upper normal, likely augmented by portable technique. No  pneumothorax. IMPRESSION: Borderline cardiomegaly.  Minimal bibasilar atelectasis. Electronically Signed   By: Donavan Foil M.D.   On: 06/15/2017 22:07     STUDIES:  LHC 9/29 > multivessel CAD notable for 100% lesion to 2nd diag, unable to wire and stent. RCA 100% lesion DES placed.  Echo 10/2 > LVEF 35-40%, moderate HK of apical myocardium.  CULTURES:  ANTIBIOTICS: Ceftriaxone 9/29 >>>   SIGNIFICANT EVENTS: 9/29 admit, LHC with PCI (DES) to RCA  LINES/TUBES:   DISCUSSION: 66 year old male admitted for chest pain and found on LHC to have 2 areas of high level stenosis. Only able to stent RCA lesion. 2 days after cath suffered a VT>VF cardiac arrest of 10 mins ACLS. Post op was awake and transferred to ICU for pressors.   ASSESSMENT / PLAN:  PULMONARY A: No acute issues  P:   Monitor Supplemental O2  CARDIOVASCULAR A:  Cardiogenic shock CAD/NSTEMI > s/p DES to RCA Chronic systolic CHF > LVEF 32-67% PAF VT to VF arrest 10 mins 10/3  P:  Hemodynamic monitoring Norepinephrine titrated to MAP 60 per primary Place CVL for norepi administration (had extravasation, pharmacy contacted for phentolamine) CVP monitoring Dr. Terrence Dupont primary. Continue amiodarone, statin  RENAL A:   AKI  CKD (baseline Serum Cr 2.8) Hypokalemia Hypocalcemia  P:   Repeat BMP this PM Replace electrolytes as indicated (replaced by ELINK this AM) Strict I&O  GASTROINTESTINAL A:   No acute issues  P:   Diet per primary PPI  HEMATOLOGIC A:   Anemia  P:  Follow CBC Transfuse per ICU guidelines  INFECTIOUS A:   Suspected UTI  P:   Continue CTX for 7 day course.  Culture urine for fevers, wbc spike.   ENDOCRINE A:   No acute issues    P:   Follow glucose on chemistry  NEUROLOGIC A:   No acute issues  P:   monitor   FAMILY  - Updates: wife updated bedside  - Inter-disciplinary family meet or Palliative Care meeting due by:  10/6   Georgann Housekeeper,  AGACNP-BC [place] [facility]/Critical Care Pager [phone] or [phone]  06/16/2017 9:59 AM  STAFF NOTE: Linwood Dibbles, MD FACP have personally reviewed patient's available data, including medical history, events of note, physical examination and test results as part of my evaluation. I have discussed with resident/NP and other care providers such as pharmacist, RN and RRT. In addition, I personally evaluated patient and elicited key findings of: awake, alert, JVd wnl, lungs without crackles, rt brachial area with infiltration levophed overnight now improved erythema, warm perfused hand rt, abdo soft, no edema, pcxr I reviewed 10/2 shows chronic changes with min int acute changes, He presents with Acute MI with stent and Vt with pulse overnight, cardiology is managing V, we should asses qtc on amio drip, if reoccurs may need to consider lidocaine, or if qtc greater 550-600 and thought o contirbute, he runs low BP in office as outpt and appears to perfsue fine with sys 85 or MAp 55, we will titrate levophed to this and hope to dc levophed, if drops BP when  off then would assess lactic and cortisol, this is likely cardiogenic in nature, we will have above goals with good ms and output urine, no lasix in fact if needed could tolerate bolus, has ATN contrast indued nephropathy, follow trend, no role vaso, avoid line if able, but if pressors restarted ( as he had extrav) would place neck line, I updated wife and pt The patient is critically ill with multiple organ systems failure and requires high complexity decision making for assessment and support, frequent evaluation and titration of therapies, application of advanced monitoring technologies and extensive interpretation of multiple databases.   Critical Care Time devoted to patient care services described in this note is30 Minutes. This time reflects time of care of this signee: Merrie Roof, MD FACP. This critical care time does not  reflect procedure time, or teaching time or supervisory time of PA/NP/Med student/Med Resident etc but could involve care discussion time. Rest per NP/medical resident whose note is outlined above and that I agree with   Lavon Paganini. Titus Mould, MD, [place] Pgr: [place] [facility] 06/16/2017 1:21 PM

## 2017-06-16 NOTE — Progress Notes (Signed)
Call placed to Dr. Terrence Dupont- Critical care team at beside - Patient was able to regain a normal rhythm and placed on a [facility].

## 2017-06-16 NOTE — Progress Notes (Signed)
Patient is hypotensive but asymptomatic B/P 80/60 (68). Patient denies pain/discomfort at present time. Patient's wife is at bedside. Left arm is swollen, CMS checks are positive. Reposition pt with pillows for comfort - HOB down to 20 degrees and call bell at reach.

## 2017-06-17 ENCOUNTER — Inpatient Hospital Stay (HOSPITAL_COMMUNITY): Payer: BLUE CROSS/BLUE SHIELD

## 2017-06-17 DIAGNOSIS — I4901 Ventricular fibrillation: Secondary | ICD-10-CM

## 2017-06-17 LAB — BASIC METABOLIC PANEL
Anion gap: 10 (ref 5–15)
Anion gap: 11 (ref 5–15)
Anion gap: 15 (ref 5–15)
BUN: 48 mg/dL — AB (ref 6–20)
BUN: 50 mg/dL — ABNORMAL HIGH (ref 6–20)
BUN: 53 mg/dL — ABNORMAL HIGH (ref 6–20)
CALCIUM: 6.8 mg/dL — AB (ref 8.9–10.3)
CALCIUM: 7.5 mg/dL — AB (ref 8.9–10.3)
CALCIUM: 7.7 mg/dL — AB (ref 8.9–10.3)
CO2: 14 mmol/L — AB (ref 22–32)
CO2: 15 mmol/L — AB (ref 22–32)
CO2: 15 mmol/L — ABNORMAL LOW (ref 22–32)
CREATININE: 2.88 mg/dL — AB (ref 0.61–1.24)
CREATININE: 2.98 mg/dL — AB (ref 0.61–1.24)
CREATININE: 2.99 mg/dL — AB (ref 0.61–1.24)
Chloride: 107 mmol/L (ref 101–111)
Chloride: 110 mmol/L (ref 101–111)
Chloride: 111 mmol/L (ref 101–111)
GFR calc Af Amer: 25 mL/min — ABNORMAL LOW (ref >60)
GFR calc Af Amer: 24 mL/min — ABNORMAL LOW (ref >60)
GFR calc non Af Amer: 22 mL/min — ABNORMAL LOW (ref >60)
GFR calc non Af Amer: 21 mL/min — ABNORMAL LOW (ref >60)
GFR, EST AFRICAN AMERICAN: 24 mL/min — AB (ref >60)
GFR, EST NON AFRICAN AMERICAN: 21 mL/min — AB (ref >60)
GLUCOSE: 102 mg/dL — AB (ref 65–99)
GLUCOSE: 119 mg/dL — AB (ref 65–99)
GLUCOSE: 149 mg/dL — AB (ref 65–99)
Potassium: 3.3 mmol/L — ABNORMAL LOW (ref 3.5–5.1)
Potassium: 3.8 mmol/L (ref 3.5–5.1)
Potassium: 4 mmol/L (ref 3.5–5.1)
Sodium: 135 mmol/L (ref 135–145)
Sodium: 136 mmol/L (ref 135–145)
Sodium: 137 mmol/L (ref 135–145)

## 2017-06-17 LAB — BPAM RBC
BLOOD PRODUCT EXPIRATION DATE: 201810182359
ISSUE DATE / TIME: 201810031342
UNIT TYPE AND RH: 5100

## 2017-06-17 LAB — CBC
HCT: 27.6 % — ABNORMAL LOW (ref 39.0–52.0)
HEMOGLOBIN: 9.4 g/dL — AB (ref 13.0–17.0)
MCH: 27.8 pg (ref 26.0–34.0)
MCHC: 34.1 g/dL (ref 30.0–36.0)
MCV: 81.7 fL (ref 78.0–100.0)
PLATELETS: 204 10*3/uL (ref 150–400)
RBC: 3.38 MIL/uL — AB (ref 4.22–5.81)
RDW: 16.2 % — ABNORMAL HIGH (ref 11.5–15.5)
WBC: 14.8 10*3/uL — ABNORMAL HIGH (ref 4.0–10.5)

## 2017-06-17 LAB — TYPE AND SCREEN
ABO/RH(D): O POS
ANTIBODY SCREEN: NEGATIVE
UNIT DIVISION: 0

## 2017-06-17 LAB — POCT I-STAT 7, (LYTES, BLD GAS, ICA,H+H)
Acid-base deficit: 12 mmol/L — ABNORMAL HIGH (ref 0.0–2.0)
Bicarbonate: 12.2 mmol/L — ABNORMAL LOW (ref 20.0–28.0)
Calcium, Ion: 0.92 mmol/L — ABNORMAL LOW (ref 1.15–1.40)
HCT: 27 % — ABNORMAL LOW (ref 39.0–52.0)
HEMOGLOBIN: 9.2 g/dL — AB (ref 13.0–17.0)
O2 Saturation: 98 %
POTASSIUM: 3.6 mmol/L (ref 3.5–5.1)
SODIUM: 140 mmol/L (ref 135–145)
TCO2: 13 mmol/L — AB (ref 22–32)
pCO2 arterial: 23.8 mmHg — ABNORMAL LOW (ref 32.0–48.0)
pH, Arterial: 7.318 — ABNORMAL LOW (ref 7.350–7.450)
pO2, Arterial: 109 mmHg — ABNORMAL HIGH (ref 83.0–108.0)

## 2017-06-17 LAB — PROTIME-INR
INR: 0.99
Prothrombin Time: 13 seconds (ref 11.4–15.2)

## 2017-06-17 LAB — GLUCOSE, CAPILLARY: Glucose-Capillary: 154 mg/dL — ABNORMAL HIGH (ref 65–99)

## 2017-06-17 LAB — MAGNESIUM: Magnesium: 3.7 mg/dL — ABNORMAL HIGH (ref 1.7–2.4)

## 2017-06-17 LAB — PHOSPHORUS: Phosphorus: 1.7 mg/dL — ABNORMAL LOW (ref 2.5–4.6)

## 2017-06-17 MED ORDER — FENTANYL CITRATE (PF) 100 MCG/2ML IJ SOLN
25.0000 ug | INTRAMUSCULAR | Status: DC | PRN
  Administered 2017-06-17 – 2017-06-18 (×7): 50 ug via INTRAVENOUS
  Administered 2017-06-18: 25 ug via INTRAVENOUS
  Administered 2017-06-18 – 2017-06-21 (×12): 50 ug via INTRAVENOUS
  Filled 2017-06-17 (×23): qty 2

## 2017-06-17 MED ORDER — SODIUM BICARBONATE 8.4 % IV SOLN
INTRAVENOUS | Status: AC
  Administered 2017-06-17 – 2017-06-18 (×2): via INTRAVENOUS
  Filled 2017-06-17 (×4): qty 150

## 2017-06-17 MED ORDER — SODIUM PHOSPHATES 45 MMOLE/15ML IV SOLN
20.0000 mmol | Freq: Once | INTRAVENOUS | Status: AC
  Administered 2017-06-17: 20 mmol via INTRAVENOUS
  Filled 2017-06-17: qty 6.67

## 2017-06-17 MED ORDER — PREDNISONE 20 MG PO TABS
20.0000 mg | ORAL_TABLET | Freq: Two times a day (BID) | ORAL | Status: DC
  Administered 2017-06-17 – 2017-06-20 (×6): 20 mg via ORAL
  Filled 2017-06-17 (×7): qty 1

## 2017-06-17 MED FILL — Medication: Qty: 1 | Status: AC

## 2017-06-17 NOTE — Progress Notes (Signed)
Chaplain responded to a code blue and was on stand by during CPR

## 2017-06-17 NOTE — Progress Notes (Addendum)
PULMONARY / CRITICAL CARE MEDICINE   Name: Darren Carr MRN: [id number] DOB: 21-Jun-1951    ADMISSION DATE:  06/12/2017 CONSULTATION DATE:  10/3  REFERRING MD:  Dr. Terrence Dupont  CHIEF COMPLAINT:    HISTORY OF PRESENT ILLNESS:  66 y.o.malewith history of coronary artery disease, hypertension, paroxysmal atrial fibrillation, and metastatic bladder cancer status post robotic cystoprostatectomy with lymphadenectomy and ileal conduit urinary diversion in June 2016, and left hydronephrosis with chronic left nephroureteral catheter that drains into his urostomy. He has two recent admissions. He was admitted in June 2018 for AKI on CKD and potentially UTI.  Nephroureteral stent replaced at that time. He was admitted 05/2017 for unstable angina. He did not undergo cardiac catheterization at that time.   He presented again to ED 9/29 with complaints of chest pain and was admitted for cardiac catheterization when demonstrated multi-vessel CAD most notable for 100% stenosis to RCA and 2nd diagonal. diag could not be stented and RCA received DES. In the late PM hours of 10/2 he suffered a cardiac arrest VT > VF with total duration at 10 minutes. He did not require intubation and was awake after the code. He was transferred to ICU for further monitoring and vasopresors.    SUBJECTIVE/INTERVAL:  Chest pain/tenderness.   VITAL SIGNS: BP 107/73 (BP Location: Left Arm)   Pulse 61   Temp 98.4 F (36.9 C) (Oral)   Resp 20   Ht 5\' 10"  (1.778 m)   Wt 68 kg (150 lb)   SpO2 96%   BMI 21.52 kg/m   HEMODYNAMICS:    VENTILATOR SETTINGS:    INTAKE / OUTPUT: I/O last 3 completed shifts: In: 4798.1 [P.O.:1480; I.V.:1988.1; Blood:340; IV Piggyback:990] Out: 3665 [Urine:3665]  PHYSICAL EXAMINATION: General:  Adult male in NAD Neuro:  Alert, oriented, non-focal HEENT:  [place]/AT, PERRL, no JVD Cardiovascular:  RRR, no MRG Lungs:  Clear, bilateral breath sounds Abdomen:  Soft, non-tender,  non-distended Musculoskeletal:  No acute deformity or ROM limitation Skin:  Grossly intact.   LABS:  BMET  Recent Labs Lab 06/16/17 2346 06/17/17 0353 06/17/17 0821  NA 137 136 135  K 3.3* 3.8 4.0  CL 107 110 111  CO2 15* 15* 14*  BUN 53* 50* 48*  CREATININE 2.99* 2.88* 2.98*  GLUCOSE 149* 119* 102*    Electrolytes  Recent Labs Lab 06/14/17 0820  06/15/17 2126  06/16/17 2346 06/17/17 0353 06/17/17 0821  CALCIUM 7.3*  < > 6.5*  < > 6.8* 7.7* 7.5*  MG 1.9  --  1.7  --   --  3.7*  --   PHOS  --   --   --   --   --  1.7*  --   < > = values in this interval not displayed.  CBC  Recent Labs Lab 06/16/17 0349 06/16/17 1756 06/16/17 2305 06/17/17 0353  WBC 11.3* 11.3*  --  14.8*  HGB 7.7* 9.0* 9.2* 9.4*  HCT 21.7* 26.8* 27.0* 27.6*  PLT 192 181  --  204    Coag's  Recent Labs Lab 06/12/17 0916  APTT 27  INR 1.14    Sepsis Markers No results for input(s): LATICACIDVEN, PROCALCITON, O2SATVEN in the last 168 hours.  ABG  Recent Labs Lab 06/15/17 2144 06/16/17 2305  PHART 7.330* 7.318*  PCO2ART VALUE BELOW REPORTABLE RANGE 23.8*  PO2ART 229* 109.0*    Liver Enzymes  Recent Labs Lab 06/15/17 2126  AST 37  ALT 32  ALKPHOS 81  BILITOT 0.6  ALBUMIN  2.2*    Cardiac Enzymes  Recent Labs Lab 06/15/17 0155 06/15/17 2126 06/16/17 0349  TROPONINI 21.84* 15.49* 17.54*    Glucose  Recent Labs Lab 06/13/17 1146 06/15/17 0743 06/16/17 0014 06/16/17 0607 06/16/17 1123 06/16/17 2355  GLUCAP 99 102* 196* 192* 172* 166*    Imaging No results found.   STUDIES:  LHC 9/29 > multivessel CAD notable for 100% lesion to 2nd diag, unable to wire and stent. RCA 100% lesion DES placed.  Echo 10/2 > LVEF 35-40%, moderate HK of apical myocardium.  CULTURES:  ANTIBIOTICS: Ceftriaxone 9/29 >>>   SIGNIFICANT EVENTS: 9/29 admit, LHC with PCI (DES) to RCA  LINES/TUBES:   DISCUSSION: 66 year old male admitted for chest pain and found on  LHC to have 2 areas of high level stenosis. Only able to stent RCA lesion. 2 days after cath suffered a VT > VF cardiac arrest of 10 mins ACLS. Post op was awake and transferred to ICU for pressors. Another VF arrest 10/3 PM corrected with defib. Thought due to amio and prolonged QTc.   ASSESSMENT / PLAN:  CARDIOVASCULAR A:  Cardiogenic shock CAD/NSTEMI > s/p DES to RCA Chronic systolic CHF > LVEF 16-10% PAF VT to VF arrest 10 mins 10/3 Norepinephrine extravasation (treated, improved) QTC prolongation  P:  Dr. Terrence Dupont Primary Hemodynamic monitoring Levo Off yesterday MAP goal 55 SBP goal 85 Amio stopped due to QTc Considering Lidocaine infusion.   RENAL A:   AKI  CKD (baseline Serum Cr 2.8) Hypokalemia Hypocalcemia  P:   Repeat BMP this PM Replace electrolytes as indicated (replaced by ELINK this AM) Strict I&O  GASTROINTESTINAL A:   No acute issues  P:   Diet per primary PPI  HEMATOLOGIC A:   Anemia  P:  Follow CBC Transfuse per ICU guidelines  INFECTIOUS A:   Suspected UTI  P:   Continue CTX for 7 day course.  Culture urine for fevers, wbc spike.   FAMILY  - Updates: wife updated bedside  - Inter-disciplinary family meet or Palliative Care meeting due by:  10/6   Georgann Housekeeper, AGACNP-BC [place] [facility] Pager [phone] or [phone]  06/17/2017 9:29 AM   STAFF NOTE: Linwood Dibbles, MD FACP have personally reviewed patient's available data, including medical history, events of note, physical examination and test results as part of my evaluation. I have discussed with resident/NP and other care providers such as pharmacist, RN and RRT. In addition, I personally evaluated patient and elicited key findings of: awake, alert, no active chest pain, no crackles, abdo soft, , no sig edema, he had Vt again and received defib, QTC was long and agree amio needs to be dc'ed, low threshold to use lido, EP saw pt and plan  defibrillator placement in am , pcxr I reviewed shows chronic changes and fluid in fissure and concerns edema, would NOT provide lasix as on min O2 now and avoid any sig electrolytes changes, K goal greater 4, mag greater 2, would use neo if BP drops and NOT catacholamines, has NONAG, and on oral bicarb, change to IV to be more effective , avoid acidosis environment with VT, supplement aggressive, supp na phos, NPO for procedure, chem in am, consider coags pre procedure, I updated pt in full  He has pleutic and rib pain from cpr, add pred low dose The patient is critically ill with multiple organ systems failure and requires high complexity decision making for assessment and support, frequent evaluation and titration of therapies,  application of advanced monitoring technologies and extensive interpretation of multiple databases.   Critical Care Time devoted to patient care services described in this note is30 Minutes. This time reflects time of care of this signee: Merrie Roof, MD FACP. This critical care time does not reflect procedure time, or teaching time or supervisory time of PA/NP/Med student/Med Resident etc but could involve care discussion time. Rest per NP/medical resident whose note is outlined above and that I agree with   Lavon Paganini. Titus Mould, MD, [place] Pgr: [place] [facility] 06/17/2017 12:37 PM

## 2017-06-17 NOTE — Progress Notes (Signed)
Subjective:  Complains of musculoskeletal chest soreness. Had polymorphic VT last night requiring defibrillation and brief CPR. No anginal chest pain.  Objective:  Vital Signs in the last 24 hours: Temp:  [97.3 F (36.3 C)-98.7 F (37.1 C)] 98.4 F (36.9 C) (10/04 0751) Pulse Rate:  [50-89] 61 (10/04 0800) Resp:  [14-30] 20 (10/04 0800) BP: (77-110)/(58-73) 107/73 (10/04 0800) SpO2:  [90 %-100 %] 96 % (10/04 0800)  Intake/Output from previous day: 10/03 0701 - 10/04 0700 In: 3430.6 [P.O.:1480; I.V.:820.6; Blood:340; IV Piggyback:790] Out: 2265 [Urine:2265] Intake/Output from this shift: Total I/O In: 647 [P.O.:637; I.V.:10] Out: 600 [Urine:600]  Physical Exam: Neck: no adenopathy, no carotid bruit, no JVD and supple, symmetrical, trachea midline Lungs: decreased breath sound at bases Heart: regular rate and rhythm, S1, S2 normal and soft systolic murmur noted Abdomen: soft, non-tender; bowel sounds normal; no masses,  no organomegaly Extremities: extremities normal, atraumatic, no cyanosis or edema  Lab Results:  Recent Labs  06/16/17 1756 06/16/17 2305 06/17/17 0353  WBC 11.3*  --  14.8*  HGB 9.0* 9.2* 9.4*  PLT 181  --  204    Recent Labs  06/17/17 0353 06/17/17 0821  NA 136 135  K 3.8 4.0  CL 110 111  CO2 15* 14*  GLUCOSE 119* 102*  BUN 50* 48*  CREATININE 2.88* 2.98*    Recent Labs  06/15/17 2126 06/16/17 0349  TROPONINI 15.49* 17.54*   Hepatic Function Panel  Recent Labs  06/15/17 2126  PROT 5.4*  ALBUMIN 2.2*  AST 37  ALT 32  ALKPHOS 81  BILITOT 0.6   No results for input(s): CHOL in the last 72 hours. No results for input(s): PROTIME in the last 72 hours.  Imaging: Imaging results have been reviewed and Dg Chest Port 1 View  Result Date: 06/15/2017 CLINICAL DATA:  Left arm pain EXAM: PORTABLE CHEST 1 VIEW COMPARISON:  06/12/2017, CT 02/24/2017 FINDINGS: Minimal bibasilar atelectasis. No consolidation or pleural effusion.  Cardiomediastinal silhouette upper normal, likely augmented by portable technique. No pneumothorax. IMPRESSION: Borderline cardiomegaly.  Minimal bibasilar atelectasis. Electronically Signed   By: Donavan Foil M.D.   On: 06/15/2017 22:07    Cardiac Studies:  Assessment/Plan:  Status post VT/V. Fib cardiac arrest Status post polymorphic VT arrest Status postCardiogenic shock Status postAcute inferolateral wall myocardial infarction status post PCI to distal RCA with excellent angiographic results Multivessel CAD history of MI in the past Ischemic cardiomyopathy Hypertension Hyperlipidemia History of paroxysmal A. 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 EtOH abuse Tobacco abuse High-grade metastatic bladder cancer history of multiple urological surgeries. History of bilateral hydronephrosis in the past History of kidney stones in the past Acute on chronic blood loss anemia secondary to blood loss during the procedure/hematuria/hydration Status post hypokalemia Plan Continue present management Will get EP consult  LOS: 5 days    Charolette Forward 06/17/2017, 10:02 AM

## 2017-06-17 NOTE — Progress Notes (Signed)
PT Cancellation Note  Patient Details Name: Darren Carr MRN: [id number] DOB: 03-31-1951   Cancelled Treatment:    Reason Eval/Treat Not Completed: Patient not medically ready Holding PT at this time as pt unstable from a cardiac standpoint. S/p cardiac arrest x2 the previous 2 nights. Plan for pacemaker tomorrow. Will follow.   Marguarite Arbour A Davison Ohms 06/17/2017, 12:18 PM Wray Kearns, [place], DPT [phone]
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
## 2017-06-18 ENCOUNTER — Encounter (HOSPITAL_COMMUNITY)
Admission: EM | Disposition: A | Discharge status: Home Health | Payer: Self-pay | Source: Home / Self Care | Attending: Cardiology

## 2017-06-18 ENCOUNTER — Other Ambulatory Visit (HOSPITAL_COMMUNITY): Payer: BLUE CROSS/BLUE SHIELD

## 2017-06-18 DIAGNOSIS — Z87448 Personal history of other diseases of urinary system: Secondary | ICD-10-CM

## 2017-06-18 HISTORY — PX: ICD IMPLANT: EP1208

## 2017-06-18 LAB — COMPREHENSIVE METABOLIC PANEL
ALT: 32 U/L (ref 17–63)
AST: 23 U/L (ref 15–41)
Albumin: 2 g/dL — ABNORMAL LOW (ref 3.5–5.0)
Alkaline Phosphatase: 91 U/L (ref 38–126)
Anion gap: 12 (ref 5–15)
BUN: 51 mg/dL — ABNORMAL HIGH (ref 6–20)
CALCIUM: 6.5 mg/dL — AB (ref 8.9–10.3)
CHLORIDE: 104 mmol/L (ref 101–111)
CO2: 18 mmol/L — ABNORMAL LOW (ref 22–32)
CREATININE: 2.91 mg/dL — AB (ref 0.61–1.24)
GFR, EST AFRICAN AMERICAN: 25 mL/min — AB (ref >60)
GFR, EST NON AFRICAN AMERICAN: 21 mL/min — AB (ref >60)
Glucose, Bld: 133 mg/dL — ABNORMAL HIGH (ref 65–99)
Potassium: 3.5 mmol/L (ref 3.5–5.1)
Sodium: 134 mmol/L — ABNORMAL LOW (ref 135–145)
TOTAL PROTEIN: 5.4 g/dL — AB (ref 6.5–8.1)
Total Bilirubin: 0.6 mg/dL (ref 0.3–1.2)

## 2017-06-18 LAB — CBC
HEMATOCRIT: 26 % — AB (ref 39.0–52.0)
HEMOGLOBIN: 8.8 g/dL — AB (ref 13.0–17.0)
MCH: 27.8 pg (ref 26.0–34.0)
MCHC: 33.8 g/dL (ref 30.0–36.0)
MCV: 82.3 fL (ref 78.0–100.0)
Platelets: 207 10*3/uL (ref 150–400)
RBC: 3.16 MIL/uL — AB (ref 4.22–5.81)
RDW: 16.3 % — ABNORMAL HIGH (ref 11.5–15.5)
WBC: 11.9 10*3/uL — ABNORMAL HIGH (ref 4.0–10.5)

## 2017-06-18 LAB — GLUCOSE, CAPILLARY: GLUCOSE-CAPILLARY: 141 mg/dL — AB (ref 65–99)

## 2017-06-18 LAB — MAGNESIUM: Magnesium: 2.1 mg/dL (ref 1.7–2.4)

## 2017-06-18 LAB — SURGICAL PCR SCREEN
MRSA, PCR: NEGATIVE
Staphylococcus aureus: NEGATIVE

## 2017-06-18 SURGERY — ICD IMPLANT

## 2017-06-18 MED ORDER — HEPARIN (PORCINE) IN NACL 2-0.9 UNIT/ML-% IJ SOLN
INTRAMUSCULAR | Status: AC | PRN
  Administered 2017-06-18: 500 mL

## 2017-06-18 MED ORDER — CEFAZOLIN SODIUM-DEXTROSE 2-4 GM/100ML-% IV SOLN
2.0000 g | INTRAVENOUS | Status: AC
  Administered 2017-06-18: 2 g via INTRAVENOUS
  Filled 2017-06-18: qty 100

## 2017-06-18 MED ORDER — BUPIVACAINE HCL (PF) 0.25 % IJ SOLN
INTRAMUSCULAR | Status: DC | PRN
  Administered 2017-06-18: 65 mL

## 2017-06-18 MED ORDER — CHLORHEXIDINE GLUCONATE 4 % EX LIQD
60.0000 mL | Freq: Once | CUTANEOUS | Status: AC
  Administered 2017-06-18: 1 via TOPICAL
  Filled 2017-06-18 (×2): qty 60

## 2017-06-18 MED ORDER — HEPARIN (PORCINE) IN NACL 2-0.9 UNIT/ML-% IJ SOLN
INTRAMUSCULAR | Status: AC
  Filled 2017-06-18: qty 500

## 2017-06-18 MED ORDER — CHLORHEXIDINE GLUCONATE 4 % EX LIQD
60.0000 mL | Freq: Once | CUTANEOUS | Status: AC
  Administered 2017-06-18: 4 via TOPICAL
  Filled 2017-06-18: qty 60

## 2017-06-18 MED ORDER — SODIUM CHLORIDE 0.9 % IV SOLN
INTRAVENOUS | Status: DC
  Administered 2017-06-18: 03:00:00 via INTRAVENOUS

## 2017-06-18 MED ORDER — SODIUM CHLORIDE 0.9 % IR SOLN
80.0000 mg | Status: AC
  Administered 2017-06-18: 80 mg
  Filled 2017-06-18: qty 2

## 2017-06-18 MED ORDER — MIDAZOLAM HCL 5 MG/5ML IJ SOLN
INTRAMUSCULAR | Status: AC
  Filled 2017-06-18: qty 5

## 2017-06-18 MED ORDER — FENTANYL CITRATE (PF) 100 MCG/2ML IJ SOLN
INTRAMUSCULAR | Status: AC
  Filled 2017-06-18: qty 2

## 2017-06-18 MED ORDER — FENTANYL CITRATE (PF) 100 MCG/2ML IJ SOLN
INTRAMUSCULAR | Status: DC | PRN
  Administered 2017-06-18: 25 ug via INTRAVENOUS
  Administered 2017-06-18: 12.5 ug via INTRAVENOUS
  Administered 2017-06-18: 25 ug via INTRAVENOUS

## 2017-06-18 MED ORDER — CEFAZOLIN SODIUM-DEXTROSE 2-4 GM/100ML-% IV SOLN
INTRAVENOUS | Status: AC
  Filled 2017-06-18: qty 100

## 2017-06-18 MED ORDER — SODIUM CHLORIDE 0.9 % IR SOLN
Status: AC
  Filled 2017-06-18: qty 2

## 2017-06-18 MED ORDER — MIDAZOLAM HCL 5 MG/5ML IJ SOLN
INTRAMUSCULAR | Status: DC | PRN
  Administered 2017-06-18 (×2): 2 mg via INTRAVENOUS
  Administered 2017-06-18: 1 mg via INTRAVENOUS

## 2017-06-18 MED ORDER — SODIUM CHLORIDE 0.9 % IV SOLN
1.0000 g | Freq: Once | INTRAVENOUS | Status: AC
  Administered 2017-06-18: 1 g via INTRAVENOUS
  Filled 2017-06-18: qty 10

## 2017-06-18 MED ORDER — BUPIVACAINE HCL (PF) 0.25 % IJ SOLN
INTRAMUSCULAR | Status: AC
  Filled 2017-06-18: qty 60

## 2017-06-18 MED ORDER — POTASSIUM CHLORIDE 10 MEQ/100ML IV SOLN
10.0000 meq | INTRAVENOUS | Status: AC
  Administered 2017-06-18 (×2): 10 meq via INTRAVENOUS
  Filled 2017-06-18 (×2): qty 100

## 2017-06-18 SURGICAL SUPPLY — 8 items
CABLE SURGICAL S-101-97-12 (CABLE) ×1 IMPLANT
ICD ELLIPSE DR CD2411-36C (ICD Generator) ×1 IMPLANT
LEAD DURATA 7122-65CM (Lead) ×1 IMPLANT
LEAD TENDRIL MRI 52CM LPA1200M (Lead) ×1 IMPLANT
PAD DEFIB LIFELINK (PAD) ×1 IMPLANT
SHEATH CLASSIC 7F (SHEATH) ×1 IMPLANT
SHEATH CLASSIC 8F (SHEATH) ×1 IMPLANT
TRAY PACEMAKER INSERTION (PACKS) ×1 IMPLANT

## 2017-06-18 NOTE — Progress Notes (Signed)
Electrophysiology Rounding Note  Patient Name: Darren Carr Date of Encounter: 06/18/2017  Primary Cardiologist: Terrence Dupont Electrophysiologist: Lovena Le   Subjective   The patient is doing well today.  At this time, the patient denies chest pain, shortness of breath, or any new concerns. Chest soreness improved.   Inpatient Medications    Scheduled Meds: . aspirin EC  81 mg Oral Daily  . atorvastatin  40 mg Oral q1800  . chlorhexidine  60 mL Topical Once  . docusate sodium  100 mg Oral BID  . feeding supplement (ENSURE ENLIVE)  237 mL Oral BID BM  . gentamicin irrigation  80 mg Irrigation To OR  . pantoprazole  40 mg Oral Daily  . polyethylene glycol  17 g Oral Daily  . potassium chloride  20 mEq Oral BID  . predniSONE  20 mg Oral BID WC  . sodium chloride flush  3 mL Intravenous Q12H  . ticagrelor  90 mg Oral BID   Continuous Infusions: . sodium chloride 999 mL/hr at 06/12/17 0940  . sodium chloride 250 mL (06/16/17 2324)  . sodium chloride 50 mL/hr at 06/18/17 0300  .  ceFAZolin (ANCEF) IV    . cefTRIAXone (ROCEPHIN)  IV Stopped (06/17/17 1007)  .  sodium bicarbonate  infusion 1000 mL 50 mL/hr at 06/17/17 1728   PRN Meds: sodium chloride, acetaminophen, fentaNYL (SUBLIMAZE) injection, nitroGLYCERIN, sodium chloride flush   Vital Signs    Vitals:   06/18/17 0100 06/18/17 0200 06/18/17 0300 06/18/17 0400  BP: (!) 92/56 98/71 (!) 89/62 94/70  Pulse: (!) 53 (!) 54 (!) 54 (!) 59  Resp: 19 (!) 21 18 14   Temp: 97.9 F (36.6 C)     TempSrc: Oral     SpO2: 95% 96% 95% 97%  Weight:      Height:        Intake/Output Summary (Last 24 hours) at 06/18/17 0650 Last data filed at 06/17/17 2200  Gross per 24 hour  Intake          1953.34 ml  Output             1600 ml  Net           353.34 ml   Filed Weights   06/14/17 0400 06/15/17 0400 06/15/17 1036  Weight: 147 lb 7.8 oz (66.9 kg) 145 lb 15.1 oz (66.2 kg) 150 lb (68 kg)    Physical Exam    GEN- The patient is  elderly and chronically ill appearing, alert and oriented x 3 today.   Head- normocephalic, atraumatic Eyes-  Sclera clear, conjunctiva pink Ears- hearing intact Oropharynx- clear Neck- supple Lungs- Clear to ausculation bilaterally, normal work of breathing Heart- Regular rate and rhythm  GI- soft, NT, ND, + BS Extremities- no clubbing, cyanosis, or edema Skin- no rash or lesion Psych- euthymic mood, full affect Neuro- strength and sensation are intact  Labs    CBC  Recent Labs  06/17/17 0353 06/18/17 0415  WBC 14.8* 11.9*  HGB 9.4* 8.8*  HCT 27.6* 26.0*  MCV 81.7 82.3  PLT 204 573   Basic Metabolic Panel  Recent Labs  06/15/17 2126  06/17/17 0353 06/17/17 0821 06/18/17 0415  NA 138  < > 136 135 134*  K 3.2*  < > 3.8 4.0 3.5  CL 110  < > 110 111 104  CO2 14*  < > 15* 14* 18*  GLUCOSE 107*  < > 119* 102* 133*  BUN 61*  < >  50* 48* 51*  CREATININE 3.06*  < > 2.88* 2.98* 2.91*  CALCIUM 6.5*  < > 7.7* 7.5* 6.5*  MG 1.7  --  3.7*  --   --   PHOS  --   --  1.7*  --   --   < > = values in this interval not displayed. Liver Function Tests  Recent Labs  06/15/17 2126 06/18/17 0415  AST 37 23  ALT 32 32  ALKPHOS 81 91  BILITOT 0.6 0.6  PROT 5.4* 5.4*  ALBUMIN 2.2* 2.0*   No results for input(s): LIPASE, AMYLASE in the last 72 hours. Cardiac Enzymes  Recent Labs  06/15/17 2126 06/16/17 0349  TROPONINI 15.49* 17.54*    Telemetry    Sinus rhythm with PVC's (personally reviewed)  Radiology    Dg Chest Port 1 View  Result Date: 06/17/2017 CLINICAL DATA:  Followup chest pain EXAM: PORTABLE CHEST 1 VIEW COMPARISON:  06/15/2017 FINDINGS: Cardiomegaly again demonstrated. Pulmonary venous hypertension with slight worsening of interstitial edema. Some volume loss in both lower lobes right more than left. IMPRESSION: Cardiomegaly, venous hypertension and mild interstitial edema which is slightly worsened. Some volume loss in the lower lobes right more than  left. Electronically Signed   By: Nelson Chimes M.D.   On: 06/17/2017 10:52     Patient Profile     Darren Carr is a 66 y.o. male admitted with STEMI and VF arrest >48 hours post revascularization   Assessment & Plan    1.  Cardiac arrest/VF Occurred >48 hours post revascularization. Concern for QT prolongation with amiodarone. Will hold off on AAD therapy for now. Episodes begin with long-short sequence. Would benefit from rate smoothing algorithm.  Plan dual chamber ICD implant today Keep K>3.9, Mg >1.8 No driving for 6 months  2.  CAD No recurrent ischemic symptoms Per Dr Terrence Dupont  3.  CKD, stage III Stable No change required today  4.  HTN Stable No change required today  5.  Tobacco abuse Pt motivated to quit Cessation advised   Signed, Chanetta Marshall, NP  06/18/2017, 6:50 AM   eP Attending  Patient seen and examined. Agree with above. The patient has been stable overnight and I have reviewed the indications/risks/benefits/goals/expectations of ICD insertion for secondary prevention of malignant ventricular arrhythmias. He wishes to proceed.  Mikle Bosworth.D.

## 2017-06-18 NOTE — Progress Notes (Signed)
PT Cancellation Note  Patient Details Name: Darren Carr MRN: [id number] DOB: 1950-12-01   Cancelled Treatment:    Reason Eval/Treat Not Completed: Medical issues which prohibited therapy Pt going for PPM today. Will await for this prior to mobility. Will follow post op.   Marguarite Arbour A Colbie Danner 06/18/2017, 11:05 AM Wray Kearns, PT, DPT [phone]
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
## 2017-06-18 NOTE — Progress Notes (Addendum)
PULMONARY / CRITICAL CARE MEDICINE   Name: Darren Carr MRN: [id number] DOB: 12/14/1950    ADMISSION DATE:  06/12/2017 CONSULTATION DATE:  10/3  REFERRING MD:  Dr. Terrence Dupont  CHIEF COMPLAINT:    HISTORY OF PRESENT ILLNESS:  66 y.o.malewith history of coronary artery disease, hypertension, paroxysmal atrial fibrillation, and metastatic bladder cancer status post robotic cystoprostatectomy with lymphadenectomy and ileal conduit urinary diversion in June 2016, and left hydronephrosis with chronic left nephroureteral catheter that drains into his urostomy. He has two recent admissions. He was admitted in June 2018 for AKI on CKD and potentially UTI.  Nephroureteral stent replaced at that time. He was admitted 05/2017 for unstable angina. He did not undergo cardiac catheterization at that time.   He presented again to ED 9/29 with complaints of chest pain and was admitted for cardiac catheterization when demonstrated multi-vessel CAD most notable for 100% stenosis to RCA and 2nd diagonal. diag could not be stented and RCA received DES. In the late PM hours of 10/2 he suffered a cardiac arrest VT > VF with total duration at 10 minutes. He did not require intubation and was awake after the code. He was transferred to ICU for further monitoring and vasopresors.    SUBJECTIVE/INTERVAL:  No shock required BP adequate and improved  VITAL SIGNS: BP 94/60   Pulse (!) 59   Temp 97.9 F (36.6 C) (Oral)   Resp (!) 22   Ht 5\' 10"  (1.778 m)   Wt 68 kg (150 lb)   SpO2 97%   BMI 21.52 kg/m   HEMODYNAMICS:    VENTILATOR SETTINGS:    INTAKE / OUTPUT: I/O last 3 completed shifts: In: 3746.3 [P.O.:1877; I.V.:992.7; IV Piggyback:876.7] Out: 3765 [Urine:3765]  PHYSICAL EXAMINATION: General: awake, alert Neuro: nonfocal, perrl HEENT:JVD wnl or increased PULM:  CTA CV:  s1 s2 RRR no r GI: soft, bs wnl no  Extremities:  No edema   LABS:  BMET  Recent Labs Lab 06/17/17 0353  06/17/17 0821 06/18/17 0415  NA 136 135 134*  K 3.8 4.0 3.5  CL 110 111 104  CO2 15* 14* 18*  BUN 50* 48* 51*  CREATININE 2.88* 2.98* 2.91*  GLUCOSE 119* 102* 133*    Electrolytes  Recent Labs Lab 06/14/17 0820  06/15/17 2126  06/17/17 0353 06/17/17 0821 06/18/17 0415  CALCIUM 7.3*  < > 6.5*  < > 7.7* 7.5* 6.5*  MG 1.9  --  1.7  --  3.7*  --   --   PHOS  --   --   --   --  1.7*  --   --   < > = values in this interval not displayed.  CBC  Recent Labs Lab 06/16/17 1756 06/16/17 2305 06/17/17 0353 06/18/17 0415  WBC 11.3*  --  14.8* 11.9*  HGB 9.0* 9.2* 9.4* 8.8*  HCT 26.8* 27.0* 27.6* 26.0*  PLT 181  --  204 207    Coag's  Recent Labs Lab 06/12/17 0916 06/17/17 1821  APTT 27  --   INR 1.14 0.99    Sepsis Markers No results for input(s): LATICACIDVEN, PROCALCITON, O2SATVEN in the last 168 hours.  ABG  Recent Labs Lab 06/15/17 2144 06/16/17 2305  PHART 7.330* 7.318*  PCO2ART VALUE BELOW REPORTABLE RANGE 23.8*  PO2ART 229* 109.0*    Liver Enzymes  Recent Labs Lab 06/15/17 2126 06/18/17 0415  AST 37 23  ALT 32 32  ALKPHOS 81 91  BILITOT 0.6 0.6  ALBUMIN 2.2* 2.0*  Cardiac Enzymes  Recent Labs Lab 06/15/17 0155 06/15/17 2126 06/16/17 0349  TROPONINI 21.84* 15.49* 17.54*    Glucose  Recent Labs Lab 06/16/17 0014 06/16/17 0607 06/16/17 1123 06/16/17 2355 06/17/17 2014 06/18/17 0043  GLUCAP 196* 192* 172* 166* 154* 141*    Imaging No results found.   STUDIES:  LHC 9/29 > multivessel CAD notable for 100% lesion to 2nd diag, unable to wire and stent. RCA 100% lesion DES placed.  Echo 10/2 > LVEF 35-40%, moderate HK of apical myocardium.  CULTURES:  ANTIBIOTICS: Ceftriaxone 9/29 >>>   SIGNIFICANT EVENTS: 9/29 admit, LHC with PCI (DES) to RCA  LINES/TUBES:   DISCUSSION: 66 year old male admitted for chest pain and found on LHC to have 2 areas of high level stenosis. Only able to stent RCA lesion. 2 days after  cath suffered a VT > VF cardiac arrest of 10 mins ACLS. Post op was awake and transferred to ICU for pressors. Another VF arrest 10/3 PM corrected with defib. Thought due to amio and prolonged QTc.   ASSESSMENT / PLAN:  CARDIOVASCULAR A:  Cardiogenic shock CAD/NSTEMI > s/p DES to RCA Chronic systolic CHF > LVEF 36-64% PAF VT to VF arrest 10 mins 10/3 Norepinephrine extravasation (treated, improved) QTC prolongation  P:  No amio For acid today MAP has improved  Would accept MAP 55-60 sys 85 if needed and good output and neurostatus Tele qtc tele asa  RENAL A:   AKI  CKD (baseline Serum Cr 2.8) Hypokalemia NON AG P:   atn plat crt K supp IV needed 2 runs Assess mag Had hydro treated 2016, repeat US Bicarb x 1 more liter  GASTROINTESTINAL A:   For procedure  P:   Diet NPO procedure PPI hmoe med  HEMATOLOGIC A:   Anemia  P:  Held dvt prev for procedure scd  INFECTIOUS A:   Suspected UTI - treated  P:   Treated to 2 more days then dc  FAMILY  - Updates: wife updated bedside  - Inter-disciplinary family meet or Palliative Care meeting due by:  10/6  Can go to tele post aicd, will sign off abx course in  Place Avoid foley when able asap   Lavon Paganini. Titus Mould, [place] Pgr: [facility]   Chest pain from cpr pred tappper If remains in 3 days = ct chest

## 2017-06-18 NOTE — Progress Notes (Signed)
Subjective:  Appreciate EP consultation and help.  Patient scheduled for ICD insertion today Patient denies any anginal chest pain or shortness of breath.  States soreness in the chest from CPR, has improved Objective:  Vital Signs in the last 24 hours: Temp:  [97.7 F (36.5 C)-98.3 F (36.8 C)] 97.9 F (36.6 C) (10/05 1138) Pulse Rate:  [46-66] 59 (10/05 1100) Resp:  [14-26] 22 (10/05 1100) BP: (89-101)/(56-77) 94/60 (10/05 1100) SpO2:  [95 %-98 %] 97 % (10/05 1100)  Intake/Output from previous day: 10/04 0701 - 10/05 0700 In: 2643.3 [P.O.:1477; I.V.:909.7; IV Piggyback:256.7] Out: 2500 [Urine:2500] Intake/Output from this shift: Total I/O In: 400 [I.V.:400] Out: -   Physical Exam: Neck: no adenopathy, no carotid bruit, no JVD and supple, symmetrical, trachea midline Lungs: decreased breath sounds at bases Heart: regular rate and rhythm, S1, S2 normal and soft systolic murmur noted Abdomen: soft, non-tender; bowel sounds normal; no masses,  no organomegaly Extremities: extremities normal, atraumatic, no cyanosis or edema  Lab Results:  Recent Labs  06/17/17 0353 06/18/17 0415  WBC 14.8* 11.9*  HGB 9.4* 8.8*  PLT 204 207    Recent Labs  06/17/17 0821 06/18/17 0415  NA 135 134*  K 4.0 3.5  CL 111 104  CO2 14* 18*  GLUCOSE 102* 133*  BUN 48* 51*  CREATININE 2.98* 2.91*    Recent Labs  06/15/17 2126 06/16/17 0349  TROPONINI 15.49* 17.54*   Hepatic Function Panel  Recent Labs  06/18/17 0415  PROT 5.4*  ALBUMIN 2.0*  AST 23  ALT 32  ALKPHOS 91  BILITOT 0.6   No results for input(s): CHOL in the last 72 hours. No results for input(s): PROTIME in the last 72 hours.  Imaging: Imaging results have been reviewed and Dg Chest Port 1 View  Result Date: 06/17/2017 CLINICAL DATA:  Followup chest pain EXAM: PORTABLE CHEST 1 VIEW COMPARISON:  06/15/2017 FINDINGS: Cardiomegaly again demonstrated. Pulmonary venous hypertension with slight worsening of  interstitial edema. Some volume loss in both lower lobes right more than left. IMPRESSION: Cardiomegaly, venous hypertension and mild interstitial edema which is slightly worsened. Some volume loss in the lower lobes right more than left. Electronically Signed   By: Nelson Chimes M.D.   On: 06/17/2017 10:52    Cardiac Studies:  Assessment/Plan:  Status post VT/V. Fib cardiac arrest Status post polymorphic VT arrest Status postCardiogenic shock Status postAcute inferolateral wall myocardial infarction status post PCI to distal RCA with excellent angiographic results Multivessel CAD history of MI in the past Ischemic cardiomyopathy Hypertension Hyperlipidemia History of paroxysmal A. 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 EtOH abuse Tobacco abuse High-grade metastatic bladder cancer history of multiple urological surgeries. History of bilateral hydronephrosis in the past History of kidney stones in the past Acute on chronic blood loss anemia secondary to blood loss during the procedure/hematuria/hydration Status post hypokalemia Plan Continue present management. Scheduled for ICD insertion later today  LOS: 6 days    Darren Carr 06/18/2017, 11:40 AM

## 2017-06-18 NOTE — Interval H&P Note (Signed)
History and Physical Interval Note:  06/18/2017 2:31 PM  [name]  has presented today for surgery, with the diagnosis of VF  The various methods of treatment have been discussed with the patient and family. After consideration of risks, benefits and other options for treatment, the patient has consented to  Procedure(s): ICD IMPLANT (N/A) as a surgical intervention .  The patient's history has been reviewed, patient examined, no change in status, stable for surgery.  I have reviewed the patient's chart and labs.  Questions were answered to the patient's satisfaction.     Darren Carr

## 2017-06-18 NOTE — Progress Notes (Signed)
Attempted report x 2 

## 2017-06-19 ENCOUNTER — Other Ambulatory Visit (HOSPITAL_COMMUNITY): Payer: BLUE CROSS/BLUE SHIELD

## 2017-06-19 ENCOUNTER — Inpatient Hospital Stay (HOSPITAL_COMMUNITY): Payer: BLUE CROSS/BLUE SHIELD

## 2017-06-19 DIAGNOSIS — I472 Ventricular tachycardia: Secondary | ICD-10-CM

## 2017-06-19 MED ORDER — AMIODARONE HCL 200 MG PO TABS
200.0000 mg | ORAL_TABLET | Freq: Every day | ORAL | Status: DC
  Administered 2017-06-19 – 2017-06-21 (×3): 200 mg via ORAL
  Filled 2017-06-19 (×3): qty 1

## 2017-06-19 NOTE — Progress Notes (Signed)
Subjective:  Denies any anginal chest pain. Continues to musculoskeletal rib pain. No further episodes of polymorphic VT tolerated ICD insertion yesterday. States overall feels better.  Objective:  Vital Signs in the last 24 hours: Temp:  [97.9 F (36.6 C)-98.9 F (37.2 C)] 98.5 F (36.9 C) (10/06 0459) Pulse Rate:  [52-156] 60 (10/06 0754) Resp:  [0-30] 21 (10/06 0459) BP: (97-110)/(64-90) 106/82 (10/06 0754) SpO2:  [96 %-100 %] 96 % (10/06 0459)  Intake/Output from previous day: 10/05 0701 - 10/06 0700 In: 1788.3 [I.V.:1478.3; IV Piggyback:310] Out: 3200 [Urine:3200] Intake/Output from this shift: Total I/O In: 360 [P.O.:360] Out: 400 [Urine:400]  Physical Exam: Neck: no adenopathy, no carotid bruit, no JVD and supple, symmetrical, trachea midline Lungs: decreased breath sound at bases Heart: regular rate and rhythm, S1, S2 normal and soft systolic murmur noted Abdomen: soft, non-tender; bowel sounds normal; no masses,  no organomegaly Extremities: extremities normal, atraumatic, no cyanosis or edema  Lab Results:  Recent Labs  06/17/17 0353 06/18/17 0415  WBC 14.8* 11.9*  HGB 9.4* 8.8*  PLT 204 207    Recent Labs  06/17/17 0821 06/18/17 0415  NA 135 134*  K 4.0 3.5  CL 111 104  CO2 14* 18*  GLUCOSE 102* 133*  BUN 48* 51*  CREATININE 2.98* 2.91*   No results for input(s): TROPONINI in the last 72 hours.  Invalid input(s): CK, MB Hepatic Function Panel  Recent Labs  06/18/17 0415  PROT 5.4*  ALBUMIN 2.0*  AST 23  ALT 32  ALKPHOS 91  BILITOT 0.6   No results for input(s): CHOL in the last 72 hours. No results for input(s): PROTIME in the last 72 hours.  Imaging: Imaging results have been reviewed and Dg Chest 2 View  Result Date: 06/19/2017 CLINICAL DATA:  AICD placement. EXAM: CHEST  2 VIEW COMPARISON:  Chest x-ray dated June 17, 2017. FINDINGS: Interval placement of a dual lead left chest wall AICD with leads terminating in the right  atrium and right ventricle. The cardiomediastinal silhouette remains mildly enlarged. Mild interstitial edema, similar to prior study. Small bilateral pleural effusions. No pneumothorax or consolidation. No acute osseous abnormality. IMPRESSION: 1. Interval left chest wall AICD placement.  No pneumothorax. 2. Cardiomegaly with interstitial edema and small bilateral pleural effusions, unchanged. Electronically Signed   By: Titus Dubin M.D.   On: 06/19/2017 08:55    Cardiac Studies:  Assessment/Plan:  Status post VT/V. Fib cardiac arrest Status post polymorphic VT arrest Status postCardiogenic shock Status postAcute inferolateral wall myocardial infarction status post PCI to distal RCA with excellent angiographic results Multivessel CAD history of MI in the past Ischemic cardiomyopathy Hypertension Hyperlipidemia History of paroxysmal A. 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 EtOH abuse Tobacco abuse High-grade metastatic bladder cancer history of multiple urological surgeries. History of bilateral hydronephrosis in the past History of kidney stones in the past Acute on chronic blood loss anemia secondary to blood loss during the procedure/hematuria/hydration  plan Continue present management We will restart amiodarone 200 mg daily Increase ambulation as tolerated Check labs in a.m.  Possible discharge tomorrow if stable,and okay with EP  LOS: 7 days    Charolette Forward 06/19/2017, 11:56 AM

## 2017-06-19 NOTE — Progress Notes (Signed)
CARDIAC REHAB PHASE I   PRE:  Rate/Rhythm: 67 Sr  BP:  Supine:   Sitting: 105/77  Standing:    SaO2: 98 RA  MODE:  Ambulation:  ft   POST:  Rate/Rhythm:   BP:  Supine:   Sitting:   Standing:    SaO2:   Rodney Langton RN 06/19/2017 3:55 PM   [id number] late entry Came to see pt to ambulate him. He had been incontinent of stool in bed , states this is the second time today it has happened. He states that he is to weak to get up. I was going to try to ambulate him but I was planning on returning  with someone to assist me. I now see that he has ambulated with Physical Therapy 20 feet with walker, shuffling gait, weakness and c/o of feeling a little lightheaded. We will follow pt on Monday.

## 2017-06-19 NOTE — Progress Notes (Signed)
Physical Therapy Treatment Patient Details Name: Darren Carr MRN: [id number] DOB: October 10, 1950 Today's Date: 06/19/2017    History of Present Illness Patient is 66 year old male with past medical history significant for coronary artery disease history of anterolateral wall myocardial infarction in the past, hypertension, hyperlipidemia, history of EtOH abuse, tobacco abuse, history of bladder cancer status post radical cystoprostatectomy with bilateral pelvic lymphadenopathy and ileal loop conduit for urinary driversion and recently implantation of the ureter to the conduit, history of paroxysmal A. fib chads vasc score of 2, history of GI bleed not a candidate  for chronic anticoagulation came to the ER complaining of recurrent chest pain for last 2-3 days associated with shortness of breath feeling weak tired and no energy. Did not seek any medical attention initially but as pain got worse so decided to come to the ED EKG done in the ED her showed normal sinus rhythm with ST elevation in inferolateral leads which were new as compared to prior EKG and code STEMI was called. Patient was recently discharged from the hospital and had negative nuclear stress test approximately 10 days ago. s/p ICD placement 10/5    PT Comments    Patient progressing well. Has not been up in several days and has some obvious gross weakness. Educated on ICD precautions with mobility. Ambulates in room with min guard assist. Feels confident his wife and child can assist him at home as needed. Will benefit from HHPT at discharge. Will continue to follow and progress towards functional goals.    Follow Up Recommendations  Home health PT;Supervision/Assistance - 24 hour     Equipment Recommendations  Rolling walker with 5" wheels;3in1 (PT)    Recommendations for Other Services       Precautions / Restrictions Precautions Precautions: ICD/Pacemaker Precaution Comments: Reviewed with patient. Restrictions Weight  Bearing Restrictions: No    Mobility  Bed Mobility Overal bed mobility: Needs Assistance Bed Mobility: Supine to Sit     Supine to sit: Min assist     General bed mobility comments: Min assist for trunk support. cues for technique and guarding due to sternal pain. Cues for safety with LUE.  Transfers Overall transfer level: Needs assistance Equipment used: Rolling walker (2 wheeled) Transfers: Sit to/from [facility] Sit to Stand: Min guard Stand pivot transfers: Min guard       General transfer comment: Min guard for safety from bed and bed side commode. Cues for technique and ICD precautions. A little light headed upon standing but BP remained at baseline of 104/68.  Ambulation/Gait Ambulation/Gait assistance: Min guard Ambulation Distance (Feet): 20 Feet Assistive device: Rolling walker (2 wheeled) Gait Pattern/deviations: Shuffle;Step-through pattern Gait velocity: slowed Gait velocity interpretation: Below normal speed for age/gender General Gait Details: Small steps with decreased clearance. No buckling noted. Min guard for safety and cues for walker placement and sequencing.    Stairs            Wheelchair Mobility    Modified Rankin (Stroke Patients Only)       Balance Overall balance assessment: Needs assistance Sitting-balance support: Feet supported;No upper extremity supported Sitting balance-Leahy Scale: Good     Standing balance support: No upper extremity supported Standing balance-Leahy Scale: Fair Standing balance comment: Able to stand during pericare without UE support. Tolerated approx 5 minutes                            Cognition Arousal/Alertness: Awake/alert Behavior During  Therapy: WFL for tasks assessed/performed Overall Cognitive Status: Within Functional Limits for tasks assessed                                        Exercises General Exercises - Lower Extremity Ankle  Circles/Pumps: AROM;Both;10 reps;Seated Quad Sets: Strengthening;Both;10 reps;Seated    General Comments General comments (skin integrity, edema, etc.): Pt had BM with small amount of blood in stool. NT notified. Pt reports this is common for him due to hemorrhoids.      Pertinent Vitals/Pain Pain Assessment: Faces Faces Pain Scale: Hurts whole lot Pain Location: chest and ribs Pain Descriptors / Indicators: Aching;Burning;Constant    Home Living                      Prior Function            PT Goals (current goals can now be found in the care plan section) Acute Rehab PT Goals Patient Stated Goal: stop coming to the hospital  PT Goal Formulation: With patient Time For Goal Achievement: 06/29/17 Potential to Achieve Goals: Good Progress towards PT goals: Progressing toward goals    Frequency    Min 3X/week      PT Plan Current plan remains appropriate    Co-evaluation              AM-PAC PT "6 Clicks" Daily Activity  Outcome Measure  Difficulty turning over in bed (including adjusting bedclothes, sheets and blankets)?: A Little Difficulty moving from lying on back to sitting on the side of the bed? : A Little Difficulty sitting down on and standing up from a chair with arms (e.g., wheelchair, bedside commode, etc,.)?: A Little Help needed moving to and from a bed to chair (including a wheelchair)?: None Help needed walking in hospital room?: None Help needed climbing 3-5 steps with a railing? : A Little 6 Click Score: 20    End of Session Equipment Utilized During Treatment: Gait belt Activity Tolerance: Patient tolerated treatment well Patient left: in chair;with call bell/phone within reach Nurse Communication: Mobility status PT Visit Diagnosis: Unsteadiness on feet (R26.81);Other abnormalities of gait and mobility (R26.89);Muscle weakness (generalized) (M62.81);Difficulty in walking, not elsewhere classified (R26.2);Pain Pain - part of body:  Shoulder (low back)     Time: 8588-5027 PT Time Calculation (min) (ACUTE ONLY): 30 min  Charges:  $Gait Training: 8-22 mins $Therapeutic Activity: 8-22 mins                    G Codes:       Camille Bal [place], [place] 06/19/2017, 3:16 PM

## 2017-06-19 NOTE — Progress Notes (Signed)
Progress Note  Patient Name: Darren Carr Date of Encounter: 06/19/2017  Primary Cardiologist: Dr. Terrence Dupont  Subjective   Minimal residual left chest pain, incisional, but rib pain persists  Inpatient Medications    Scheduled Meds: . aspirin EC  81 mg Oral Daily  . atorvastatin  40 mg Oral q1800  . docusate sodium  100 mg Oral BID  . feeding supplement (ENSURE ENLIVE)  237 mL Oral BID BM  . pantoprazole  40 mg Oral Daily  . polyethylene glycol  17 g Oral Daily  . predniSONE  20 mg Oral BID WC  . sodium chloride flush  3 mL Intravenous Q12H  . ticagrelor  90 mg Oral BID   Continuous Infusions: . sodium chloride 999 mL/hr at 06/12/17 0940  . sodium chloride 250 mL (06/16/17 2324)  . cefTRIAXone (ROCEPHIN)  IV Stopped (06/18/17 0917)   PRN Meds: sodium chloride, acetaminophen, fentaNYL (SUBLIMAZE) injection, nitroGLYCERIN, sodium chloride flush   Vital Signs    Vitals:   06/18/17 1939 06/19/17 0059 06/19/17 0459 06/19/17 0754  BP: 97/77 101/75 107/83 106/82  Pulse: 64 66 (!) 58 60  Resp: (!) 22 (!) 21 (!) 21   Temp: 98.9 F (37.2 C) 98.5 F (36.9 C) 98.5 F (36.9 C)   TempSrc: Oral Oral Oral   SpO2: 96% 96% 96%   Weight:      Height:        Intake/Output Summary (Last 24 hours) at 06/19/17 0911 Last data filed at 06/19/17 0806  Gross per 24 hour  Intake          1588.33 ml  Output             3600 ml  Net         -2011.67 ml   Filed Weights   06/14/17 0400 06/15/17 0400 06/15/17 1036  Weight: 147 lb 7.8 oz (66.9 kg) 145 lb 15.1 oz (66.2 kg) 150 lb (68 kg)    Telemetry    Normal sinus rhythm - Personally Reviewed  ECG    None - Personally Reviewed  Physical Exam   GEN:  somewhat diskempt appearing, No acute distress.   Neck:  6 cm JVD Cardiac: RRR, no murmurs, rubs, or gallops.  Respiratory: Clear to auscultation bilaterally. No ICD pocket hematoma GI: Soft, nontender, non-distended  MS: No edema; No deformity. Neuro:  Nonfocal  Psych:  Normal affect   Labs    Chemistry Recent Labs Lab 06/15/17 2126  06/17/17 0353 06/17/17 0821 06/18/17 0415  NA 138  < > 136 135 134*  K 3.2*  < > 3.8 4.0 3.5  CL 110  < > 110 111 104  CO2 14*  < > 15* 14* 18*  GLUCOSE 107*  < > 119* 102* 133*  BUN 61*  < > 50* 48* 51*  CREATININE 3.06*  < > 2.88* 2.98* 2.91*  CALCIUM 6.5*  < > 7.7* 7.5* 6.5*  PROT 5.4*  --   --   --  5.4*  ALBUMIN 2.2*  --   --   --  2.0*  AST 37  --   --   --  23  ALT 32  --   --   --  32  ALKPHOS 81  --   --   --  91  BILITOT 0.6  --   --   --  0.6  GFRNONAA 20*  < > 22* 21* 21*  GFRAA 23*  < > 25* 24* 25*  ANIONGAP 14  < > 11 10 12   < > = values in this interval not displayed.   Hematology Recent Labs Lab 06/16/17 1756 06/16/17 2305 06/17/17 0353 06/18/17 0415  WBC 11.3*  --  14.8* 11.9*  RBC 3.27*  --  3.38* 3.16*  HGB 9.0* 9.2* 9.4* 8.8*  HCT 26.8* 27.0* 27.6* 26.0*  MCV 82.0  --  81.7 82.3  MCH 27.5  --  27.8 27.8  MCHC 33.6  --  34.1 33.8  RDW 16.1*  --  16.2* 16.3*  PLT 181  --  204 207    Cardiac Enzymes Recent Labs Lab 06/14/17 0210 06/15/17 0155 06/15/17 2126 06/16/17 0349  TROPONINI 25.28* 21.84* 15.49* 17.54*   No results for input(s): TROPIPOC in the last 168 hours.   BNPNo results for input(s): BNP, PROBNP in the last 168 hours.   DDimer No results for input(s): DDIMER in the last 168 hours.   Radiology    Dg Chest 2 View  Result Date: 06/19/2017 CLINICAL DATA:  AICD placement. EXAM: CHEST  2 VIEW COMPARISON:  Chest x-ray dated June 17, 2017. FINDINGS: Interval placement of a dual lead left chest wall AICD with leads terminating in the right atrium and right ventricle. The cardiomediastinal silhouette remains mildly enlarged. Mild interstitial edema, similar to prior study. Small bilateral pleural effusions. No pneumothorax or consolidation. No acute osseous abnormality. IMPRESSION: 1. Interval left chest wall AICD placement.  No pneumothorax. 2. Cardiomegaly with  interstitial edema and small bilateral pleural effusions, unchanged. Electronically Signed   By: Titus Dubin M.D.   On: 06/19/2017 08:55   Dg Chest Port 1 View  Result Date: 06/17/2017 CLINICAL DATA:  Followup chest pain EXAM: PORTABLE CHEST 1 VIEW COMPARISON:  06/15/2017 FINDINGS: Cardiomegaly again demonstrated. Pulmonary venous hypertension with slight worsening of interstitial edema. Some volume loss in both lower lobes right more than left. IMPRESSION: Cardiomegaly, venous hypertension and mild interstitial edema which is slightly worsened. Some volume loss in the lower lobes right more than left. Electronically Signed   By: Nelson Chimes M.D.   On: 06/17/2017 10:52    Cardiac Studies   none  Patient Profile     66 y.o. male status post acute MI, status post intervention, status post recurrent acute episodes of polymorphic ventricular tachycardia, greater than 48 hours after revascularization for the acute MI now status post ICD and surgeon for secondary prevention  Assessment & Plan    1. VT - at this point, I would suggest amiodarone 200 mg daily. In addition continue beta blocker therapy as tolerated. 2. ICD - his [employer] Jude dual-chamber ICD has been interrogated under my direct supervision and is working normally. Usual follow-up. We will arrange incision check and device follow-up in our office. 3. Tobacco abuse - I strongly encouraged smoking cessation. He states that he has stop smoking.  For questions or updates, please contact [place] Please consult [email or web address] for contact info under Cardiology/STEMI.      Signed, Cristopher Peru, MD  06/19/2017, 9:11 AM  Patient ID: Darren Carr, male   DOB: Jan 16, 1951, 66 y.o.   MRN: [id number]

## 2017-06-20 LAB — CBC
HEMATOCRIT: 27.8 % — AB (ref 39.0–52.0)
Hemoglobin: 9.2 g/dL — ABNORMAL LOW (ref 13.0–17.0)
MCH: 27.8 pg (ref 26.0–34.0)
MCHC: 33.1 g/dL (ref 30.0–36.0)
MCV: 84 fL (ref 78.0–100.0)
PLATELETS: 270 10*3/uL (ref 150–400)
RBC: 3.31 MIL/uL — ABNORMAL LOW (ref 4.22–5.81)
RDW: 16.4 % — AB (ref 11.5–15.5)
WBC: 13.7 10*3/uL — AB (ref 4.0–10.5)

## 2017-06-20 LAB — BASIC METABOLIC PANEL
Anion gap: 12 (ref 5–15)
BUN: 47 mg/dL — AB (ref 6–20)
CHLORIDE: 106 mmol/L (ref 101–111)
CO2: 21 mmol/L — ABNORMAL LOW (ref 22–32)
CREATININE: 2.54 mg/dL — AB (ref 0.61–1.24)
Calcium: 6 mg/dL — CL (ref 8.9–10.3)
GFR calc Af Amer: 29 mL/min — ABNORMAL LOW (ref >60)
GFR calc non Af Amer: 25 mL/min — ABNORMAL LOW (ref >60)
GLUCOSE: 111 mg/dL — AB (ref 65–99)
Potassium: 3.4 mmol/L — ABNORMAL LOW (ref 3.5–5.1)
SODIUM: 139 mmol/L (ref 135–145)

## 2017-06-20 LAB — MAGNESIUM: Magnesium: 1.6 mg/dL — ABNORMAL LOW (ref 1.7–2.4)

## 2017-06-20 MED ORDER — SODIUM CHLORIDE 0.9 % IV SOLN
3.0000 g | Freq: Once | INTRAVENOUS | Status: AC
  Administered 2017-06-20: 3 g via INTRAVENOUS
  Filled 2017-06-20: qty 30

## 2017-06-20 MED ORDER — PREDNISONE 10 MG PO TABS
10.0000 mg | ORAL_TABLET | Freq: Two times a day (BID) | ORAL | Status: DC
  Administered 2017-06-20 – 2017-06-21 (×3): 10 mg via ORAL
  Filled 2017-06-20 (×3): qty 1

## 2017-06-20 MED ORDER — POTASSIUM CHLORIDE CRYS ER 20 MEQ PO TBCR
40.0000 meq | EXTENDED_RELEASE_TABLET | Freq: Once | ORAL | Status: AC
  Administered 2017-06-20: 40 meq via ORAL
  Filled 2017-06-20: qty 2

## 2017-06-20 NOTE — Progress Notes (Signed)
CRITICAL VALUE ALERT  Critical Value:  Calcium 6.0  Date & Time Notied:  06/20/17 6:23 am  Provider Notified: Dr. Terrence Dupont  Orders Received/Actions taken:   Received verbal order for 3g Calcium gluconate IVPB and K-dur 40 mEq PO.

## 2017-06-20 NOTE — Progress Notes (Signed)
Subjective:  The complains of musculoskeletal rib pain denies any shortness of breath. States overall feels very tired. Discussed with patient and his wife regarding short-term rehabilitation states has help at home and would like to go home once more stronger. She Objective:  Vital Signs in the last 24 hours: Temp:  [98 F (36.7 C)-98.1 F (36.7 C)] 98 F (36.7 C) (10/07 0501) Pulse Rate:  [59-63] 59 (10/07 0501) Resp:  [17-21] 21 (10/07 0501) BP: (104-111)/(78-85) 108/78 (10/07 0501) SpO2:  [96 %-98 %] 97 % (10/07 0501) Weight:  [71.7 kg (158 lb 1.6 oz)] 71.7 kg (158 lb 1.6 oz) (10/07 0234)  Intake/Output from previous day: 10/06 0701 - 10/07 0700 In: 560 [P.O.:560] Out: 3150 [Urine:3150] Intake/Output from this shift: Total I/O In: -  Out: 1400 [Urine:1400]  Physical Exam: Neck: no adenopathy, no carotid bruit, no JVD, supple, symmetrical, trachea midline and ICD site dry Lungs: clear to auscultation bilaterally Heart: regular rate and rhythm, S1, S2 normal and soft systolic murmur noted Abdomen: soft, non-tender; bowel sounds normal; no masses,  no organomegaly Extremities: extremities normal, atraumatic, no cyanosis or edema  Lab Results:  Recent Labs  06/18/17 0415 06/20/17 0452  WBC 11.9* 13.7*  HGB 8.8* 9.2*  PLT 207 270    Recent Labs  06/18/17 0415 06/20/17 0452  NA 134* 139  K 3.5 3.4*  CL 104 106  CO2 18* 21*  GLUCOSE 133* 111*  BUN 51* 47*  CREATININE 2.91* 2.54*   No results for input(s): TROPONINI in the last 72 hours.  Invalid input(s): CK, MB Hepatic Function Panel  Recent Labs  06/18/17 0415  PROT 5.4*  ALBUMIN 2.0*  AST 23  ALT 32  ALKPHOS 91  BILITOT 0.6   No results for input(s): CHOL in the last 72 hours. No results for input(s): PROTIME in the last 72 hours.  Imaging: Imaging results have been reviewed and Dg Chest 2 View  Result Date: 06/19/2017 CLINICAL DATA:  AICD placement. EXAM: CHEST  2 VIEW COMPARISON:  Chest  x-ray dated June 17, 2017. FINDINGS: Interval placement of a dual lead left chest wall AICD with leads terminating in the right atrium and right ventricle. The cardiomediastinal silhouette remains mildly enlarged. Mild interstitial edema, similar to prior study. Small bilateral pleural effusions. No pneumothorax or consolidation. No acute osseous abnormality. IMPRESSION: 1. Interval left chest wall AICD placement.  No pneumothorax. 2. Cardiomegaly with interstitial edema and small bilateral pleural effusions, unchanged. Electronically Signed   By: Titus Dubin M.D.   On: 06/19/2017 08:55   US Renal  Result Date: 06/19/2017 CLINICAL DATA:  Elevated BUN and creatinine. History of hydronephrosis. EXAM: RENAL / URINARY TRACT ULTRASOUND COMPLETE COMPARISON:  CT abdomen and pelvis 02/13/2017 FINDINGS: Right Kidney: Length: 8.6 cm. Right kidney is atrophic with increased parenchymal echotexture consistent with chronic medical renal disease. Small cyst in the upper pole measuring 1.3 cm maximal diameter. No hydronephrosis. Left Kidney: Length: 11.1 cm. Heterogeneous left renal parenchymal thinning with increased echotexture suggesting chronic medical renal disease. Mild hydronephrosis is demonstrated. Cause is not identified. Given the history of ileal conduit, this may be due to reflux. 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 Incidental note of small bilateral pleural effusions. IMPRESSION: 1. Bilateral renal parenchymal atrophy with increased echotexture consistent with chronic medical renal disease. 2. Mild hydronephrosis of the left kidney. 3. Small bilateral pleural effusions. Electronically Signed   By: Lucienne Capers M.D.   On: 06/19/2017 22:18  Cardiac Studies:  Assessment/Plan:  Status post VT/V. Fib cardiac arrest status post ICD Status post polymorphic VT arrest Status postCardiogenic shock Status postAcute inferolateral wall myocardial infarction status post PCI to  distal RCA with excellent angiographic results Multivessel CAD history of MI in the past Ischemic cardiomyopathy Hypertension Hyperlipidemia History of paroxysmal A. 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 EtOH abuse Tobacco abuse High-grade metastatic bladder cancer history of multiple urological surgeries. History of bilateral hydronephrosis in the past History of kidney stones in the past Acute on chronic blood loss anemia secondary to blood loss during the procedure/hematuria/hydration Hypokalemia/hypocalcemia  Plan As per orders Increase ambulation as tolerated Check labs in a.m. We'll add beta blockers and vasodilators as blood pressure tolerates   LOS: 8 days    Charolette Forward 06/20/2017, 11:46 AM

## 2017-06-21 ENCOUNTER — Encounter (HOSPITAL_COMMUNITY): Payer: Self-pay | Admitting: Internal Medicine

## 2017-06-21 LAB — COMPREHENSIVE METABOLIC PANEL
ALK PHOS: 76 U/L (ref 38–126)
ALT: 18 U/L (ref 17–63)
ANION GAP: 12 (ref 5–15)
AST: 24 U/L (ref 15–41)
Albumin: 2.1 g/dL — ABNORMAL LOW (ref 3.5–5.0)
BILIRUBIN TOTAL: 0.2 mg/dL — AB (ref 0.3–1.2)
BUN: 41 mg/dL — ABNORMAL HIGH (ref 6–20)
CALCIUM: 6.4 mg/dL — AB (ref 8.9–10.3)
CO2: 18 mmol/L — ABNORMAL LOW (ref 22–32)
Chloride: 107 mmol/L (ref 101–111)
Creatinine, Ser: 2.36 mg/dL — ABNORMAL HIGH (ref 0.61–1.24)
GFR calc non Af Amer: 27 mL/min — ABNORMAL LOW (ref >60)
GFR, EST AFRICAN AMERICAN: 32 mL/min — AB (ref >60)
Glucose, Bld: 146 mg/dL — ABNORMAL HIGH (ref 65–99)
Potassium: 4 mmol/L (ref 3.5–5.1)
SODIUM: 137 mmol/L (ref 135–145)
TOTAL PROTEIN: 5.4 g/dL — AB (ref 6.5–8.1)

## 2017-06-21 LAB — CBC
HCT: 28.3 % — ABNORMAL LOW (ref 39.0–52.0)
HEMOGLOBIN: 9.2 g/dL — AB (ref 13.0–17.0)
MCH: 27.7 pg (ref 26.0–34.0)
MCHC: 32.5 g/dL (ref 30.0–36.0)
MCV: 85.2 fL (ref 78.0–100.0)
Platelets: 286 10*3/uL (ref 150–400)
RBC: 3.32 MIL/uL — ABNORMAL LOW (ref 4.22–5.81)
RDW: 16.4 % — ABNORMAL HIGH (ref 11.5–15.5)
WBC: 14.7 10*3/uL — ABNORMAL HIGH (ref 4.0–10.5)

## 2017-06-21 MED ORDER — TICAGRELOR 90 MG PO TABS: 90.0000 mg | ORAL_TABLET | Freq: Two times a day (BID) | ORAL | 11 refills | Status: AC

## 2017-06-21 MED ORDER — ENSURE ENLIVE PO LIQD: 237.0000 mL | Freq: Two times a day (BID) | ORAL | 12 refills | Status: AC

## 2017-06-21 MED ORDER — POTASSIUM PHOSPHATES 15 MMOLE/5ML IV SOLN
10.0000 mmol | Freq: Once | INTRAVENOUS | Status: AC
  Administered 2017-06-21: 10 mmol via INTRAVENOUS
  Filled 2017-06-21: qty 3.33

## 2017-06-21 MED ORDER — SODIUM CHLORIDE 0.9 % IV SOLN
3.0000 g | Freq: Once | INTRAVENOUS | Status: AC
  Administered 2017-06-21: 3 g via INTRAVENOUS
  Filled 2017-06-21: qty 30

## 2017-06-21 MED ORDER — MAGNESIUM SULFATE 2 GM/50ML IV SOLN
2.0000 g | Freq: Once | INTRAVENOUS | Status: AC
  Administered 2017-06-21: 2 g via INTRAVENOUS
  Filled 2017-06-21: qty 50

## 2017-06-21 NOTE — Discharge Summary (Signed)
Discharge summary dictated on 06/21/2017.  Dictation number is [id number]

## 2017-06-21 NOTE — Care Management Note (Addendum)
Case Management Note  Patient Details  Name: Darren Carr MRN: [id number] Date of Birth: 11-30-1950  Subjective/Objective:  Pt presented for Acute MI- from home with son and wife. Plan for d/c home today. PT recommendations for [facility].                   Action/Plan: Agency List provided and pt/ wife chose [facility]. Referral made to [facility] with [facility]. SOC to begin within 24-48 hours post d/c. No further needs from CM at this time.   Expected Discharge Date:  06/21/17               Expected Discharge Plan:  [facility]  In-House Referral:  NA  Discharge planning Services  CM Consult  Post Acute Care Choice:  Home Health Choice offered to:  Patient, Spouse  DME Arranged:   [facility] DME Agency:  [facility] Arranged:  [facility] [place] Agency:  [facility]  Status of Service:  Completed, signed off  If discussed at [place] of Stay Meetings, dates discussed:    Additional Comments:  Bethena Roys, RN 06/21/2017, 10:56 AM

## 2017-06-21 NOTE — Progress Notes (Signed)
[id number] PT to walk so did not walk with pt. MI education completed with pt and wife who voiced understanding. Stressed importance of brilinta with stent. Has brilinta card per wife. Discussed MI restrictions, NTG use, smoking cessation, low sodium and heart healthy diets, and encouraged walking as tolerated. PT to recommend home equipment. Gave fake cigarette and smoking cessation handout. Pt plans to quit cold [place]. Gave CHF booklet and low sodium diets due to low EF. Encouraged daily weights and 2000 mg restriction. Discussed CRP 2 and will refer to [facility] program.  Graylon Good RN BSN 06/21/2017 11:58 AM

## 2017-06-21 NOTE — Discharge Instructions (Signed)
Acute Coronary Syndrome Acute coronary syndrome (ACS) is a serious problem in which there is suddenly not enough blood and oxygen supplied to the heart. ACS may mean that one or more of the blood vessels in your heart (coronary arteries) may be blocked. ACS can result in chest pain or a heart attack (myocardial infarction or MI). What are the causes? This condition is caused by atherosclerosis, which is the buildup of fat and cholesterol (plaque) on the inside of the arteries. Over time, the plaque may narrow or block the artery, and this will lessen blood flow to the heart. Plaque can also become weak and break off within a coronary artery to form a clot and cause a sudden blockage. What increases the risk? The risk factors of this condition include:  High cholesterol levels.  High blood pressure (hypertension).  Smoking.  Diabetes.  Age.  Family history of chest pain, heart disease, or stroke.  Lack of exercise.  What are the signs or symptoms? The most common signs of this condition include:  Chest pain, which can be: ? A crushing or squeezing in the chest. ? A tightness, pressure, fullness, or heaviness in the chest. ? Present for more than a few minutes, or it can stop and recur.  Pain in the arms, neck, jaw, or back.  Unexplained heartburn or indigestion.  Shortness of breath.  Nausea.  Sudden cold sweats.  Feeling light-headed or dizzy.  Sometimes, this condition has no symptoms. How is this diagnosed? ACS may be diagnosed through the following tests:  Electrocardiogram (ECG).  Blood tests.  Coronary angiogram. This is a procedure to look at the coronary arteries to see if there is any blockage.  How is this treated? Treatment for ACS may include:  Healthy behavioral changes to reduce or control risk factors.  Medicine.  Coronary stenting.A stent helps to keep an artery open.  Coronary angioplasty. This procedure widens a narrowed or blocked  artery.  Coronary artery bypass surgery. This will allow your blood to pass the blockage (bypass) to reach your heart.  Follow these instructions at home: Eating and drinking  Follow a heart-healthy diet. A dietitian can you help to educate you about healthy food options and changes.  Use healthy cooking methods such as roasting, grilling, broiling, baking, poaching, steaming, or stir-frying. Talk to a dietitian to learn more about healthy cooking methods. Medicines  Take medicines only as directed by your health care provider.  Do not take the following medicines unless your health care provider approves: ? Nonsteroidal anti-inflammatory drugs (NSAIDs), such as ibuprofen, naproxen, or celecoxib. ? Vitamin supplements that contain vitamin A, vitamin E, or both. ? Hormone replacement therapy that contains estrogen with or without progestin.  Stop illegal drug use. Activity  Follow an exercise program that is approved by your health care provider.  Plan rest periods when you are fatigued. Lifestyle  Do not use any tobacco products, including cigarettes, chewing tobacco, or electronic cigarettes. If you need help quitting, ask your health care provider.  If you drink alcohol, and your health care provider approves, limit your alcohol intake to no more than 1 drink per day. One drink equals 12 ounces of beer, 5 ounces of wine, or 1 ounces of hard liquor.  Learn to manage stress.  Maintain a healthy weight. Lose weight as approved by your health care provider. General instructions  Manage other health conditions, such as hypertension and diabetes, as directed by your health care provider.  Keep all follow-up  visits as directed by your health care provider. This is important.  Your health care provider may ask you to monitor your blood pressure. A blood pressure reading consists of a higher number over a lower number, such as 110 over 72, written as 110/72. Ideally, your blood  pressure should be: ? Below 140/90 if you have no other medical conditions. ? Below 130/80 if you have diabetes or kidney disease. Get help right away if:  You have pain in your chest, neck, arm, jaw, stomach, or back that lasts more than a few minutes, is recurring, or is not relieved by taking medicine under your tongue (sublingual nitroglycerin).  You have profuse sweating without cause.  You have unexplained: ? Heartburn or indigestion. ? Shortness of breath or difficulty breathing. ? Nausea or vomiting. ? Fatigue. ? Feelings of nervousness or anxiety. ? Weakness. ? Diarrhea.  You have sudden light-headedness or dizziness.  You faint. These symptoms may represent a serious problem that is an emergency. Do not wait to see if the symptoms will go away. Get medical help right away. Call your local emergency services (911 in the [place] Do not drive yourself to the clinic or hospital. This information is not intended to replace advice given to you by your health care provider. Make sure you discuss any questions you have with your health care provider. Document Released: 08/31/2005 Document Revised: 02/12/2016 Document Reviewed: 01/02/2014 Elsevier Interactive Patient Education  2017 [place].  Coronary Angiogram With Stent Coronary angiogram with stent placement is a procedure to widen or open a narrow blood vessel of the heart (coronary artery). Arteries may become blocked by cholesterol buildup (plaques) in the lining or wall. When a coronary artery becomes partially blocked, blood flow to that area decreases. This may lead to chest pain or a heart attack (myocardial infarction). A stent is a small piece of metal that looks like mesh or a spring. Stent placement may be done as treatment for a heart attack or right after a coronary angiogram in which a blocked artery is found. Let your health care provider know about:  Any allergies you have.  All medicines you are taking,  including vitamins, herbs, eye drops, creams, and over-the-counter medicines.  Any problems you or family members have had with anesthetic medicines.  Any blood disorders you have.  Any surgeries you have had.  Any medical conditions you have.  Whether you are pregnant or may be pregnant. What are the risks? Generally, this is a safe procedure. However, problems may occur, including:  Damage to the heart or its blood vessels.  A return of blockage.  Bleeding, infection, or bruising at the insertion site.  A collection of blood under the skin (hematoma) at the insertion site.  A blood clot in another part of the body.  Kidney injury.  Allergic reaction to the dye or contrast that is used.  Bleeding into the abdomen (retroperitoneal bleeding).  What happens before the procedure? Staying hydrated Follow instructions from your health care provider about hydration, which may include:  Up to 2 hours before the procedure - you may continue to drink clear liquids, such as water, clear fruit juice, black coffee, and plain tea.  Eating and drinking restrictions Follow instructions from your health care provider about eating and drinking, which may include:  8 hours before the procedure - stop eating heavy meals or foods such as meat, fried foods, or fatty foods.  6 hours before the procedure - stop eating light meals  or foods, such as toast or cereal.  2 hours before the procedure - stop drinking clear liquids.  Ask your health care provider about:  Changing or stopping your regular medicines. This is especially important if you are taking diabetes medicines or blood thinners.  Taking medicines such as ibuprofen. These medicines can thin your blood. Do not take these medicines before your procedure if your health care provider instructs you not to. Generally, aspirin is recommended before a procedure of passing a small, thin tube (catheter) through a blood vessel and into the  heart (cardiac catheterization).  What happens during the procedure?  An IV tube will be inserted into one of your veins.  You will be given one or more of the following: ? A medicine to help you relax (sedative). ? A medicine to numb the area where the catheter will be inserted into an artery (local anesthetic).  To reduce your risk of infection: ? Your health care team will wash or sanitize their hands. ? Your skin will be washed with soap. ? Hair may be removed from the area where the catheter will be inserted.  Using a guide wire, the catheter will be inserted into an artery. The location may be in your groin, in your wrist, or in the fold of your arm (near your elbow).  A type of X-ray (fluoroscopy) will be used to help guide the catheter to the opening of the arteries in the heart.  A dye will be injected into the catheter, and X-rays will be taken. The dye will help to show where any narrowing or blockages are located in the arteries.  A tiny wire will be guided to the blocked spot, and a balloon will be inflated to make the artery wider.  The stent will be expanded and will crush the plaques into the wall of the vessel. The stent will hold the area open and improve the blood flow. Most stents have a drug coating to reduce the risk of the stent narrowing over time.  The artery may be made wider using a drill, laser, or other tools to remove plaques.  When the blood flow is better, the catheter will be removed. The lining of the artery will grow over the stent, which stays where it was placed. This procedure may vary among health care providers and hospitals. What happens after the procedure?  If the procedure is done through the leg, you will be kept in bed lying flat for about 6 hours. You will be instructed to not bend and not cross your legs.  The insertion site will be checked frequently.  The pulse in your foot or wrist will be checked frequently.  You may have  additional blood tests, X-rays, and a test that records the electrical activity of your heart (electrocardiogram, or ECG). This information is not intended to replace advice given to you by your health care provider. Make sure you discuss any questions you have with your health care provider. Document Released: 03/07/2003 Document Revised: 04/30/2016 Document Reviewed: 04/05/2016 Elsevier Interactive Patient Education  2017 [place] Discharge Instructions for  Pacemaker/Defibrillator Patients  Activity No heavy lifting or vigorous activity with your left/right arm for 6 to 8 weeks.  Do not raise your left/right arm above your head for one week.  Gradually raise your affected arm as drawn below.           __        06/22/17  06/23/17                  06/24/17               06/25/17  NO DRIVING for  6 months   .  WOUND CARE - Keep the wound area clean and dry.  Do not get this area wet for one week. No showers for one week; you may shower on 06/25/17    . - The tape/steri-strips on your wound will fall off; do not pull them off.  No bandage is needed on the site.  DO  NOT apply any creams, oils, or ointments to the wound area. - If you notice any drainage or discharge from the wound, any swelling or bruising at the site, or you develop a fever > 101? F after you are discharged home, call the office at once.  Special Instructions - You are still able to use cellular telephones; use the ear opposite the side where you have your pacemaker/defibrillator.  Avoid carrying your cellular phone near your device. - When traveling through airports, show security personnel your identification card to avoid being screened in the metal detectors.  Ask the security personnel to use the hand wand. - Avoid arc welding equipment, MRI testing (magnetic resonance imaging), TENS units (transcutaneous nerve stimulators).  Call the office for questions about other  devices. - Avoid electrical appliances that are in poor condition or are not properly grounded. - Microwave ovens are safe to be near or to operate.  Additional information for defibrillator patients should your device go off: - If your device goes off ONCE and you feel fine afterward, notify the device clinic nurses. - If your device goes off ONCE and you do not feel well afterward, call 911. - If your device goes off TWICE, call 911. - If your device goes off THREE times in one day, call 911.  DO NOT DRIVE YOURSELF OR A FAMILY MEMBER WITH A DEFIBRILLATOR TO THE HOSPITAL--CALL 911.

## 2017-06-21 NOTE — Progress Notes (Signed)
Physical Therapy Treatment Patient Details Name: Darren Carr MRN: [id number] DOB: [birth year] Today's Date: 06/21/2017    History of Present Illness Patient is 67 year old male with past medical history significant for coronary artery disease history of anterolateral wall myocardial infarction in the past, hypertension, hyperlipidemia, history of EtOH abuse, tobacco abuse, history of bladder cancer status post radical cystoprostatectomy with bilateral pelvic lymphadenopathy and ileal loop conduit for urinary driversion and recently implantation of the ureter to the conduit, history of paroxysmal A. fib chads vasc score of 2, history of GI bleed not a candidate  for chronic anticoagulation came to the ER complaining of recurrent chest pain for last 2-3 days associated with shortness of breath feeling weak tired and no energy. Did not seek any medical attention initially but as pain got worse so decided to come to the ED EKG done in the ED her showed normal sinus rhythm with ST elevation in inferolateral leads which were new as compared to prior EKG and code STEMI was called. Patient was recently discharged from the hospital and had negative nuclear stress test approximately 10 days ago. s/p ICD placement 10/5    PT Comments    Patient progressing well towards PT goals. Improved ambulation distance with min guard assist for balance/safety. Requires use of RW for support. Reviewed ICD precautions with pt and wife. Recommend BSC as well as pt has low toilets. Will continue to follow and progress as tolerated.   Follow Up Recommendations  Home health PT;Supervision/Assistance - 24 hour     Equipment Recommendations  Rolling walker with 5" wheels;3in1 (PT)    Recommendations for Other Services       Precautions / Restrictions Precautions Precautions: ICD/Pacemaker Precaution Comments: Reviewed with patient. Restrictions Weight Bearing Restrictions: No    Mobility  Bed Mobility Overal bed  mobility: Needs Assistance Bed Mobility: Supine to Sit     Supine to sit: Supervision;HOB elevated     General bed mobility comments: Use of rail for support; no assist needed. Cues for safety with LUE.  Transfers Overall transfer level: Needs assistance Equipment used: Rolling walker (2 wheeled) Transfers: Sit to/from Stand   Stand pivot transfers: Min guard       General transfer comment: Min guard for safety from bed. Cues for technique and ICD precautions. no dizziness.   Ambulation/Gait Ambulation/Gait assistance: Min guard Ambulation Distance (Feet): 150 Feet Assistive device: Rolling walker (2 wheeled) Gait Pattern/deviations: Step-through pattern;Decreased stride length;Trunk flexed Gait velocity: decreased Gait velocity interpretation: Below normal speed for age/gender General Gait Details: Slow, mildly unsteady gait with RW for support; 2/4 DOE. VSS throughout.   Stairs            Wheelchair Mobility    Modified Rankin (Stroke Patients Only)       Balance Overall balance assessment: Needs assistance Sitting-balance support: Feet supported;No upper extremity supported Sitting balance-Leahy Scale: Good     Standing balance support: During functional activity;Bilateral upper extremity supported Standing balance-Leahy Scale: Poor Standing balance comment: Requires UE support for dynamic standing/ambulation.                            Cognition Arousal/Alertness: Awake/alert Behavior During Therapy: WFL for tasks assessed/performed Overall Cognitive Status: Within Functional Limits for tasks assessed  Exercises      General Comments        Pertinent Vitals/Pain Pain Assessment: 0-10 Pain Score: 7  Pain Location: chest and ribs Pain Descriptors / Indicators: Aching;Constant Pain Intervention(s): Monitored during session;Repositioned;Premedicated before session    Home  Living                      Prior Function            PT Goals (current goals can now be found in the care plan section) Progress towards PT goals: Progressing toward goals    Frequency    Min 3X/week      PT Plan Current plan remains appropriate    Co-evaluation              AM-PAC PT "6 Clicks" Daily Activity  Outcome Measure  Difficulty turning over in bed (including adjusting bedclothes, sheets and blankets)?: None Difficulty moving from lying on back to sitting on the side of the bed? : None Difficulty sitting down on and standing up from a chair with arms (e.g., wheelchair, bedside commode, etc,.)?: None Help needed moving to and from a bed to chair (including a wheelchair)?: None Help needed walking in hospital room?: A Little Help needed climbing 3-5 steps with a railing? : A Little 6 Click Score: 22    End of Session Equipment Utilized During Treatment: Gait belt Activity Tolerance: Patient tolerated treatment well Patient left: in chair;with call bell/phone within reach;with family/visitor present Nurse Communication: Mobility status PT Visit Diagnosis: Unsteadiness on feet (R26.81);Muscle weakness (generalized) (M62.81);Difficulty in walking, not elsewhere classified (R26.2);Pain Pain - part of body:  (chest/ribs)     Time: 1150-1210 PT Time Calculation (min) (ACUTE ONLY): 20 min  Charges:  $Gait Training: 8-22 mins                    G Codes:       Darren Carr, PT, DPT [phone]     Darren Carr 06/21/2017, 12:18 PM

## 2017-06-21 NOTE — Progress Notes (Signed)
Noted pt's Mg level 1.6.  Dr. Terrence Dupont notified with new orders received.  Will continue to monitor closely.

## 2017-06-22 NOTE — Discharge Summary (Signed)
NAMEMIROSLAV, GIN                 ACCOUNT NO.:  [id number]  MEDICAL RECORD NO.:  [id number]  LOCATION:                                 FACILITY:  PHYSICIAN:  Nicol Herbig N. Terrence Dupont, M.D. DATE OF BIRTH:  [birth year]  DATE OF ADMISSION:  06/12/2017 DATE OF DISCHARGE:  06/21/2017                              DISCHARGE SUMMARY   ADMITTING DIAGNOSES: 1. Acute inferolateral wall myocardial infarction. 2. Multivessel coronary artery disease, history of myocardial     infarction in the past. 3. Hypertension. 4. Hyperlipidemia. 5. History of paroxysmal atrial fibrillation in the past, CHADS-VASc     score of 2. 6. History of gastrointestinal bleed in the past. 7. Acute on chronic kidney injury, stage IV. 8. Prerenal azotemia. 9. Leukocytosis rule out urinary tract infection. 10.ETOH abuse. 11.Tobacco abuse. 12.High-grade metastatic bladder cancer, history of multiple     urological surgeries as above. 13.History of bilateral hydronephrosis in the past. 14.History of kidney stones in the past.  DISCHARGE DIAGNOSES: 1. Status post acute inferolateral wall myocardial infarction, status     post PCI to distal RCA with excellent angiographic results. 2. Status post recurrent ventricular tachycardia/ventricular     fibrillation cardiac arrest status post ICD insertion this     admission. 3. Status post cardiogenic shock. 4. Multivessel coronary artery disease, history of myocardial     infarction in the past. 5. Ischemic cardiomyopathy. 6. Hypertension. 7. Hyperlipidemia. 8. History of paroxysmal atrial fibrillation in the past. 9. History of gastrointestinal bleed in the past. 10.Resolving acute on chronic kidney disease, stage IV. 11.Prerenal azotemia. 12.Status post urinary tract infection. 13.ETOH abuse. 14.Tobacco abuse. 15.High-grade metastatic bladder cancer, history of multiple     urological surgeries in the past. 16.History of bilateral hydronephrosis in the past. 17.History  of kidney stones in the past. 18.Acute on chronic blood-loss anemia secondary to blood loss during     the procedure/hematuria/hydration.  DISCHARGE HOME MEDICATIONS: 1. Feeding supplement, Ensure 1 can twice daily as tolerated. 2. Brilinta 90 mg 1 tablet twice daily. 3. Amiodarone 200 mg daily. 4. Aspirin 81 mg daily. 5. Atorvastatin 40 mg daily. 6. Carvedilol 3.125 mg twice daily. 7. Colace 100 mg 2 daily as needed. 8. Nitrostat sublingual p.r.n. 9. Protonix 40 mg 1 tablet twice daily.  DIET:  Low salt, low cholesterol.  ACTIVITY:  Increase activity slowly as tolerated.  Postcardiac cath/PTCA stent instructions have been given.  Post ICD instructions have been given.  FOLLOWUP:  Follow up with me in 1 week.  Follow up with [facility] on July 01, 2017 at 10:30 a.m.  CONDITION AT DISCHARGE:  Stable.  BRIEF HISTORY:  Mr. Bernasconi is a 66 year old male with past medical history significant for coronary artery disease, history of anterolateral wall myocardial infarction in the past, hypertension, hyperlipidemia, history of EtOH abuse, tobacco abuse, history of bladder cancer status post radical cystoprostatectomy with bilateral pelvic lymphadenopathy and ileal loop conduit for urinary diversion and recently implantation of the ureter to the conduit, history of paroxysmal atrial fibrillation CHADS-VASc score of 2, history of GI bleed, not a candidate for chronic anticoagulation.  He came to the ER complaining of recurrent chest  pain for last 2 to 3 days associated with shortness of breath, feeling weak, tired and no energy.  Did not seek any medical attention initially, but as the pain got worse, he decided to come to the ED.  EKG done in the ED showed normal sinus rhythm with ST elevation in inferolateral leads which were new as compared to prior EKG and code STEMI was called.  The patient was recently discharged from the hospital, had negative nuclear stress test  approximately 10 days ago.  PHYSICAL EXAMINATION:  GENERAL: On examination, he was alert and awake. VITAL SIGNS: Blood pressure was 134/72.  He was afebrile. HEENT: Conjunctivae was pink. NECK: Supple.  No JVD.  No bruit. LUNGS: Decreased breath sounds at bases. CARDIOVASCULAR: S1, S2 was normal.  There were soft systolic murmur and S3 gallop. ABDOMEN: Soft.  Bowel sounds were present.  Nontender. EXTREMITIES: There is no clubbing, cyanosis, or edema.  LABORATORY DATA:  His sodium was 140, potassium 4.1, BUN was 96, creatinine 5.12, which has jumped from 2.56 approximately 10 days ago. His troponin I was 0.13.  Repeat troponin I by lab 9.64 and 8.21.  Post- PCI troponin I was 26.32, 25.47, 21.84 which is trending down.  His hemoglobin was 11.6, hematocrit 36, white count of 15.0.  Cholesterol was 132, HDL 38, LDL 98.  Last electrolytes:  Sodium is 137, potassium 4.0, glucose 146, BUN 41, and creatinine 2.36.  Hemoglobin has been stable for last few days 9.2, hematocrit 28.3, and white count of 14.7.  BRIEF HOSPITAL COURSE:  The patient was emergently taken to the cath lab and underwent left cardiac cath with selective left and right coronary Angiography and PTCA stenting of distal RCA.  PTCA stenting to 100% occluded distal RCA with excellent angiographic results.  Postprocedure, the patient did not have any anginal chest pain, but had 2 episodes of ventricular tachycardia and ventricular fibrillation cardiac arrest requiring brief period of CPR and defibrillation.  The patient was transferred to ICU and then again in ICU, the patient had polymorphic VT and VFib requiring EP consultation and placement of ICD.  The patient was restarted on p.o. amiodarone.  The patient did not have any further episodes of VFib during the hospital stay neither the patient required any ICD shocks.  OT/PT consultation was obtained.  The patient is ambulating in room without any problems.  The  patient will be discharged home on above medications.  His renal function has come back to baseline.  Creatinine has come down from 5.12 to 2.36.  The patient will be discharged home on above medications and will be followed up closely in my office in 1 week and will be followed by EP as outpatient as scheduled.  We will discuss with the patient regarding further intervention as outpatient for complete revascularization.  The patient has been counseled extensively regarding lifestyle changes, compliance with medication, followup and diet and smoking cessation.     Allegra Lai. Terrence Dupont, M.D.     MNH/MEDQ  D:  06/21/2017  T:  06/22/2017  Job:  [id number]  cc:   Allegra Lai. Terrence Dupont, M.D.

## 2017-07-01 ENCOUNTER — Ambulatory Visit (INDEPENDENT_AMBULATORY_CARE_PROVIDER_SITE_OTHER): Payer: BLUE CROSS/BLUE SHIELD | Admitting: *Deleted

## 2017-07-01 DIAGNOSIS — I469 Cardiac arrest, cause unspecified: Secondary | ICD-10-CM

## 2017-07-01 LAB — CUP PACEART INCLINIC DEVICE CHECK
Brady Statistic RV Percent Paced: 0.04 %
HIGH POWER IMPEDANCE MEASURED VALUE: 69.75 Ohm
Implantable Lead Implant Date: 20181005
Implantable Lead Model: 7122
Implantable Pulse Generator Implant Date: 20181005
Lead Channel Impedance Value: 512.5 Ohm
Lead Channel Impedance Value: 612.5 Ohm
Lead Channel Pacing Threshold Amplitude: 0.75 V
Lead Channel Pacing Threshold Pulse Width: 0.5 ms
Lead Channel Pacing Threshold Pulse Width: 0.5 ms
Lead Channel Sensing Intrinsic Amplitude: 11.8 mV
Lead Channel Sensing Intrinsic Amplitude: 3.1 mV
Lead Channel Setting Pacing Amplitude: 0.875
Lead Channel Setting Pacing Amplitude: 3.5 V
Lead Channel Setting Pacing Pulse Width: 0.5 ms
MDC IDC LEAD IMPLANT DT: 20181005
MDC IDC LEAD LOCATION: 753859
MDC IDC LEAD LOCATION: 753860
MDC IDC MSMT BATTERY REMAINING LONGEVITY: 97 mo
MDC IDC MSMT LEADCHNL RA PACING THRESHOLD AMPLITUDE: 0.75 V
MDC IDC PG SERIAL: 9769876
MDC IDC SESS DTM: 20181018162709
MDC IDC SET LEADCHNL RV SENSING SENSITIVITY: 0.5 mV
MDC IDC STAT BRADY RA PERCENT PACED: 9.5 %

## 2017-07-01 NOTE — Progress Notes (Signed)
Wound check appointment. Steri-strips removed. Wound without redness or edema. Stitch clipped, antibiotic ointment and band aid applied. Incision edges approximated, wound well healed. Normal device function. Thresholds, sensing, and impedances consistent with implant measurements. Device programmed at 3.5V for extra safety margin until 3 month visit. Histogram distribution appropriate for patient and level of activity. No mode switches or ventricular arrhythmias noted. Patient educated about wound care, arm mobility, lifting restrictions, shock plan. ROV with GT 09/24/16.

## 2017-08-16 ENCOUNTER — Telehealth: Payer: Self-pay | Admitting: *Deleted

## 2017-08-16 NOTE — Telephone Encounter (Signed)
Dorien Chihuahua calling from [facility] requesting that Mr. Kaczmarczyk ICD be deactivated. She reports that they have an order from a [facility]osp[facility] physician. Mr. Primm has a [employer] ICD so I gave her the number to request industry to come deactivate the ICD. She is appreciative.

## 2017-09-08 DIAGNOSIS — I2510 Atherosclerotic heart disease of native coronary artery without angina pectoris: Secondary | ICD-10-CM | POA: Insufficient documentation

## 2017-09-08 DIAGNOSIS — K922 Gastrointestinal hemorrhage, unspecified: Secondary | ICD-10-CM | POA: Insufficient documentation

## 2017-09-08 DIAGNOSIS — M549 Dorsalgia, unspecified: Secondary | ICD-10-CM

## 2017-09-08 DIAGNOSIS — I10 Essential (primary) hypertension: Secondary | ICD-10-CM | POA: Insufficient documentation

## 2017-09-08 DIAGNOSIS — Z87442 Personal history of urinary calculi: Secondary | ICD-10-CM | POA: Insufficient documentation

## 2017-09-08 DIAGNOSIS — G8929 Other chronic pain: Secondary | ICD-10-CM | POA: Insufficient documentation

## 2017-09-08 DIAGNOSIS — Z9289 Personal history of other medical treatment: Secondary | ICD-10-CM | POA: Insufficient documentation

## 2017-09-08 DIAGNOSIS — R0600 Dyspnea, unspecified: Secondary | ICD-10-CM | POA: Insufficient documentation

## 2017-09-08 DIAGNOSIS — I480 Paroxysmal atrial fibrillation: Secondary | ICD-10-CM | POA: Insufficient documentation

## 2017-09-14 DEATH — deceased

## 2017-09-24 ENCOUNTER — Encounter: Payer: BLUE CROSS/BLUE SHIELD | Admitting: Internal Medicine

## 2017-11-24 IMAGING — XA IR CATHETER TUBE CHANGE
1 series · 3 of 3 positions shown · non-contrast
Comparison: none

INDICATION: History of urothelial carcinoma status post cystectomy and diverting
urostomy. Patient has a left ureter structure and now has a left
nephroureteral catheter. Patient presents for routine exchange.
Patient recently had a urinary tract infection.

[Series 300: tube placements · 3 of 3 slices shown]
[im 1/3]
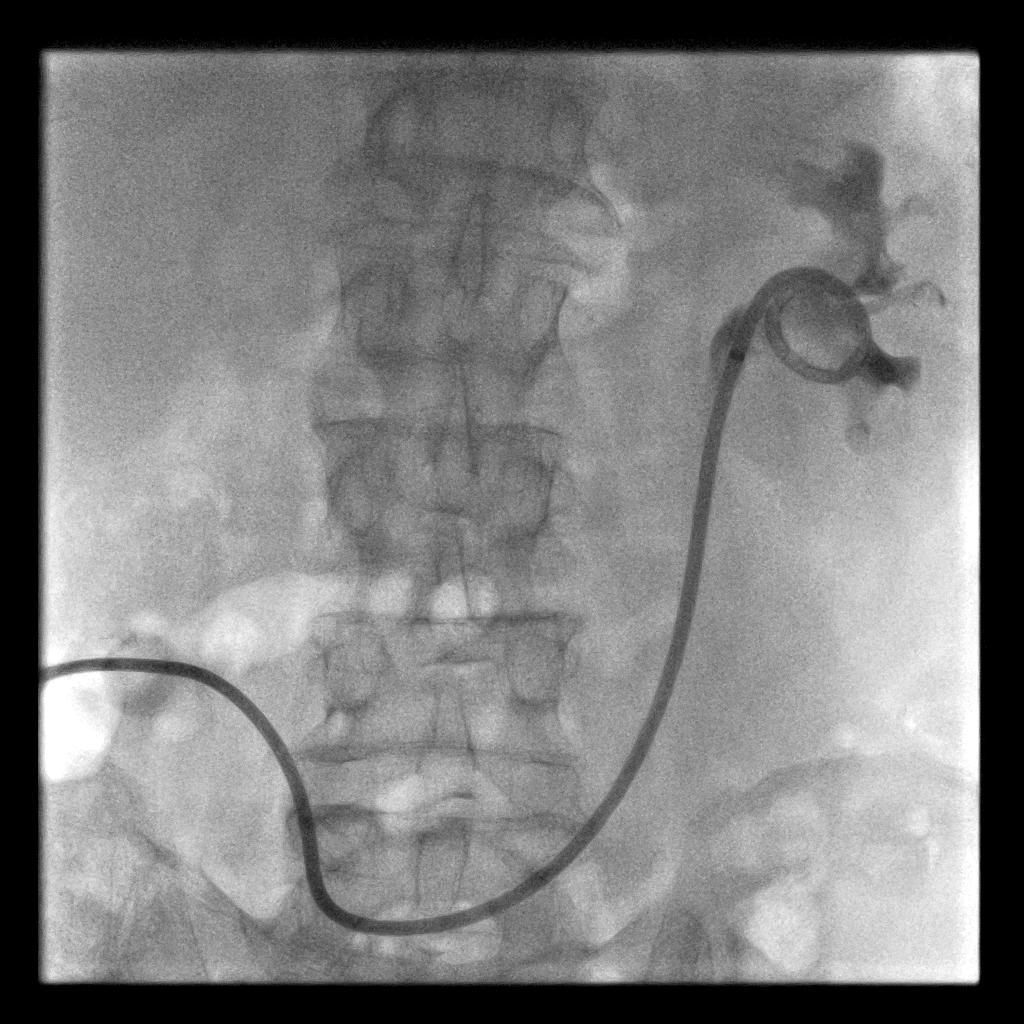
[im 2/3]
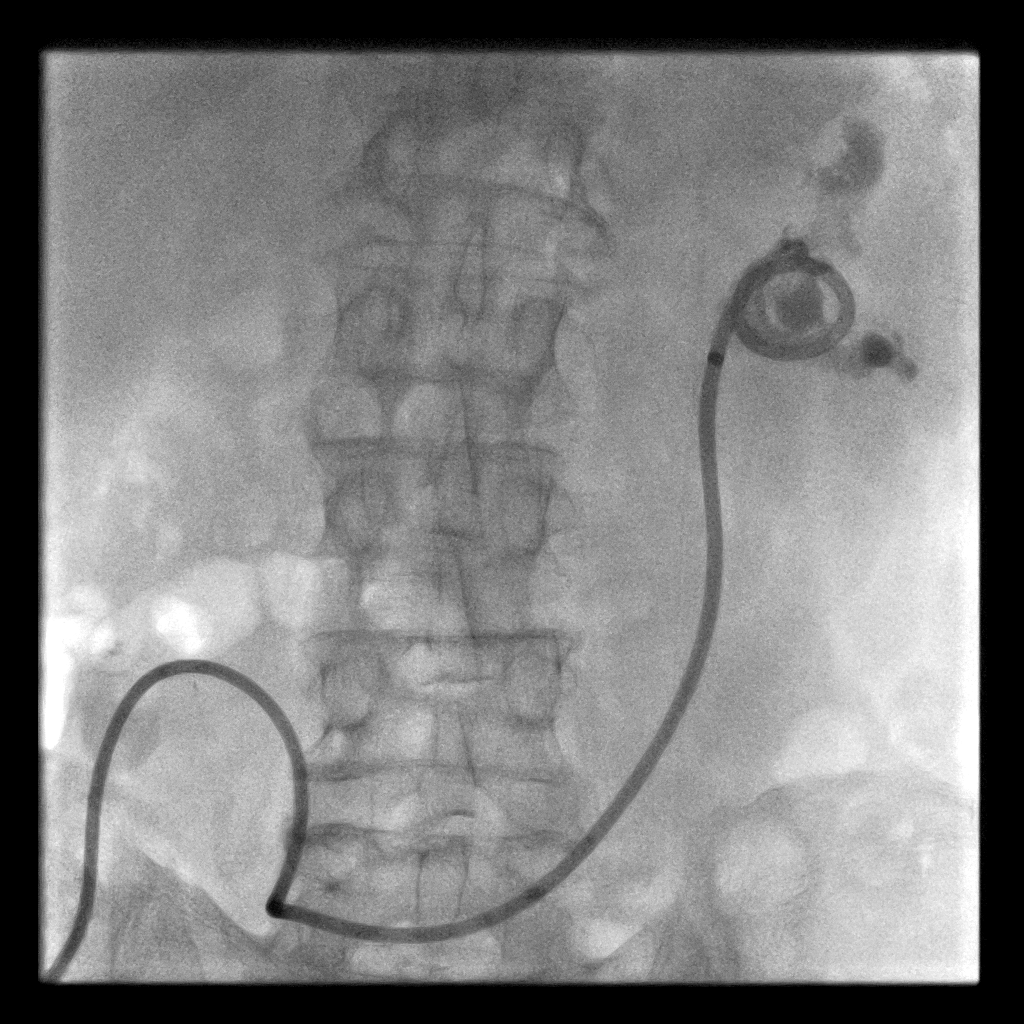
[im 3/3]
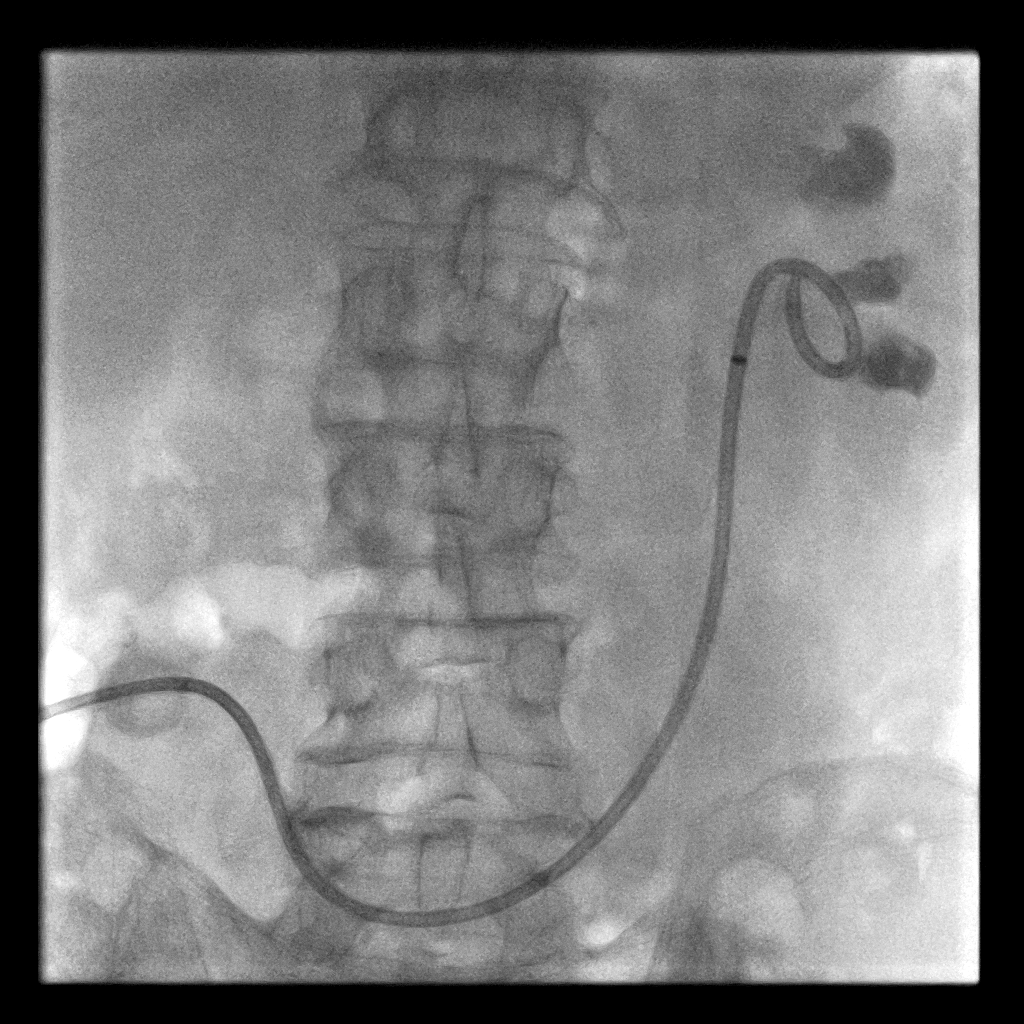

[3 of 3 positions shown; findings below may reference images not displayed]

EXAM:
EXCHANGE OF LEFT NEPHROURETERAL CATHETER WITH FLUOROSCOPY

MEDICATIONS:
None

ANESTHESIA/SEDATION:
None

FLUOROSCOPY TIME:  48 seconds, 8.0 mGy

COMPLICATIONS:
None immediate.

PROCEDURE:
Informed written consent was obtained from the patient after a
thorough discussion of the procedural risks, benefits and
alternatives. All questions were addressed. Maximal Sterile Barrier
Technique was utilized including caps, mask, sterile gowns, sterile
gloves, sterile drape, hand hygiene and skin antiseptic. A timeout
was performed prior to the initiation of the procedure.

Patient was placed supine on the interventional table. The existing
catheter and urostomy were prepped and draped in a sterile fashion.
The drain would not easily flash. Drain appeared to be partially
occluded. Stiff Glidewire was advanced through the catheter. After
the wire was removed, we were able to inject contrast through the
catheter. Catheter was cut and removed over a stiff Amplatz wire. A
new 10 French nephroureteral catheter was advanced over the wire and
positioned in the renal pelvis. Cloudy yellow urine was aspirated. A
sample of this fluid was sent for urine culture. Catheter was
flushed with saline and attached to gravity bag.
FINDINGS: Initially, the catheter was partially occluded. Cloudy yellow fluid
was removed from the left renal collecting system. Sample sent for
culture.
IMPRESSION: Successful exchange of the left nephroureteral catheter via the
urostomy.

The catheter was partially occluded. The patient will return in 6
weeks for routine exchange.

Sample of the urine from the left collecting system was sent for
culture.

## 2018-06-14 IMAGING — XA IR CATHETER TUBE CHANGE
1 series · 4 of 4 positions shown · non-contrast
Comparison: None.

INDICATION: Five week routine exchange of left nephro ureteral catheter via
ileal conduit.

EXAM:
IR CATHETER TUBE CHANGE

[Series 300: tube placements · 4 of 4 slices shown]
[im 1/4]
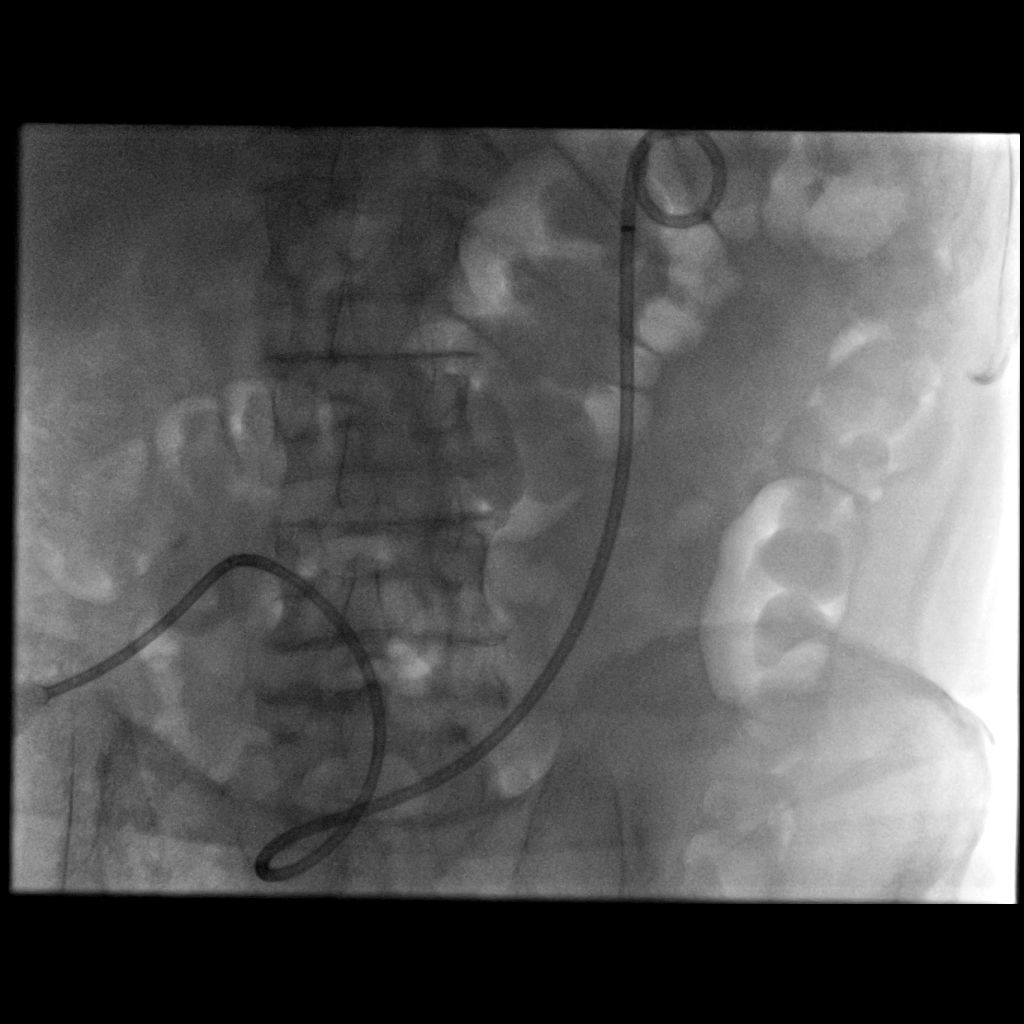
[im 2/4]
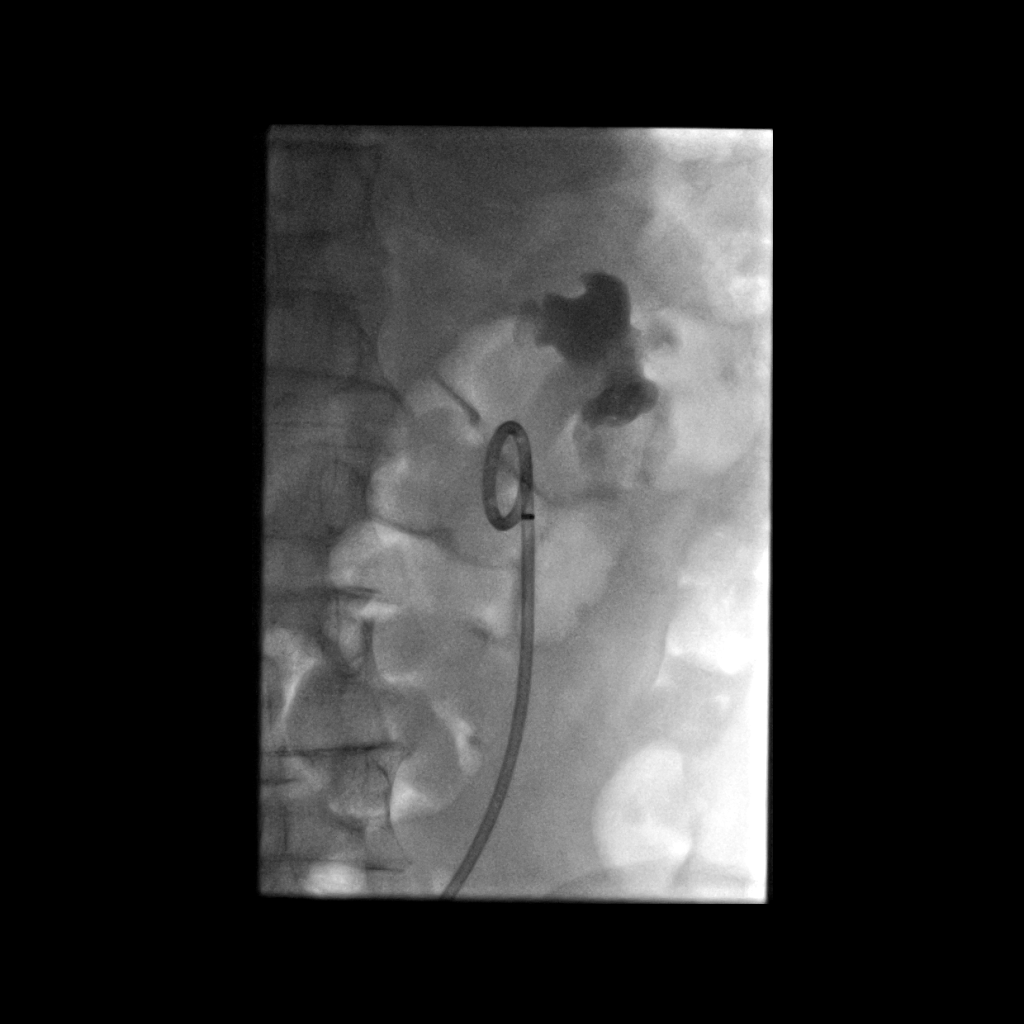
[im 3/4]
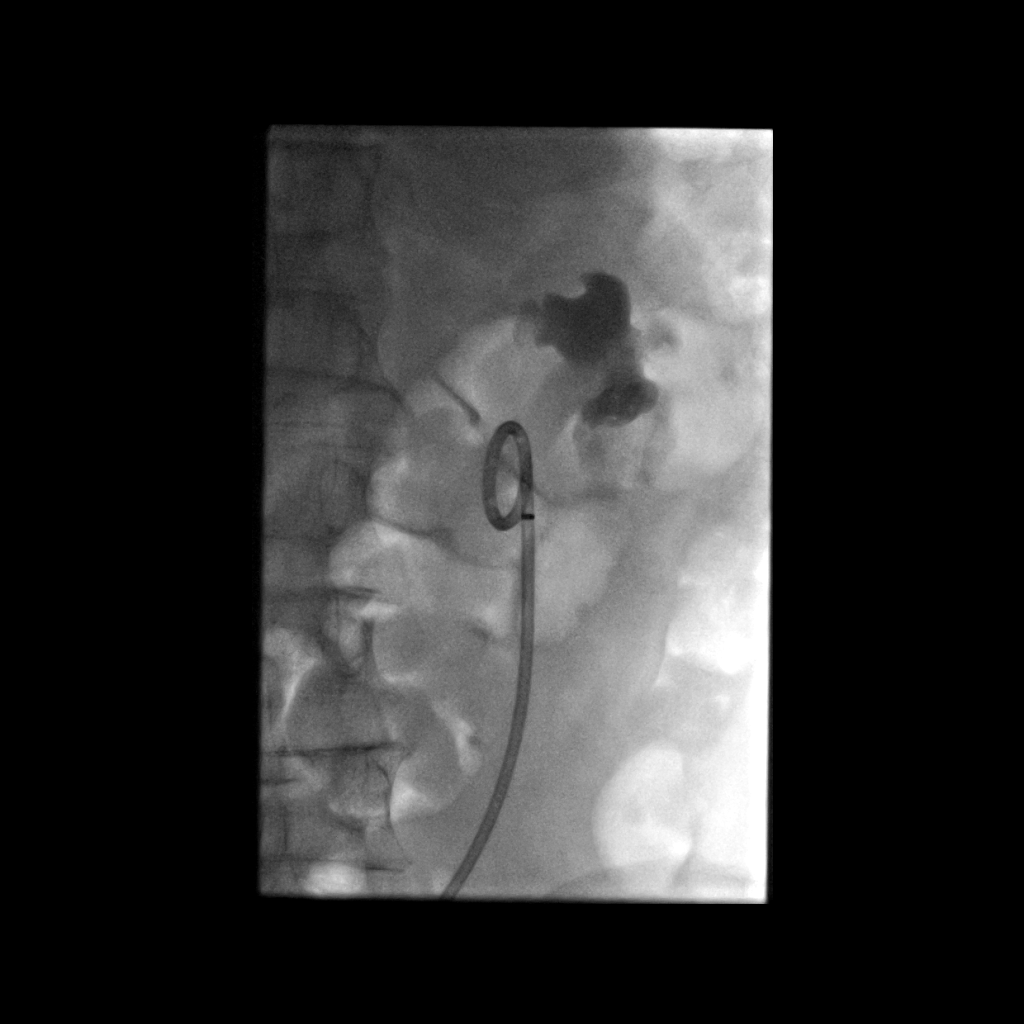
[im 4/4]
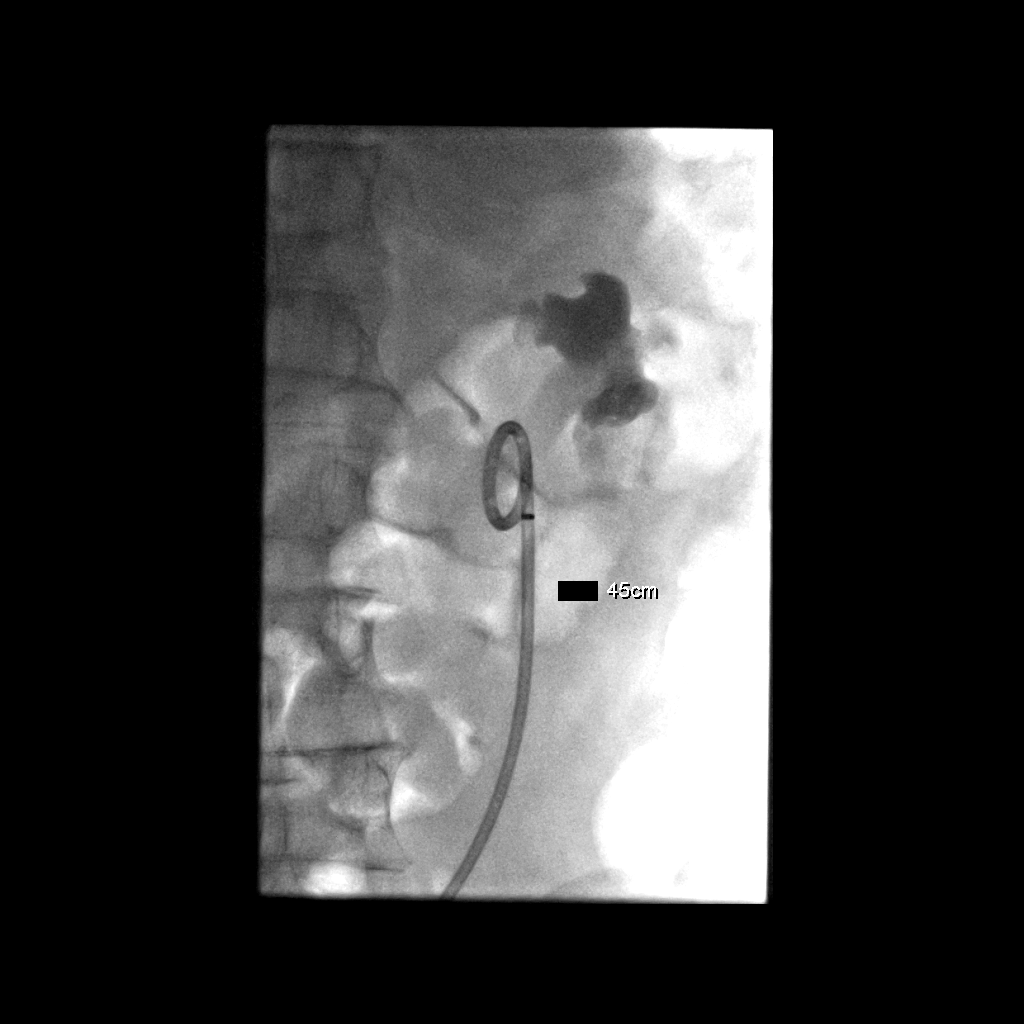

[4 of 4 positions shown; findings below may reference images not displayed]

MEDICATIONS:
None

ANESTHESIA/SEDATION:
None

CONTRAST:  10mL FT1466-NYY IOPAMIDOL ([id number]-NYY) INJECTION 61% -
administered into the collecting system(s)

FLUOROSCOPY TIME:  Fluoroscopy Time: 6 minutes 54 seconds (69.8
mGy).

COMPLICATIONS:
None immediate.

PROCEDURE:
Informed written consent was obtained from the patient after a
thorough discussion of the procedural risks, benefits and
alternatives. All questions were addressed. Maximal Sterile Barrier
Technique was utilized including caps, mask, sterile gowns, sterile
gloves, sterile drape, hand hygiene and skin antiseptic. A timeout
was performed prior to the initiation of the procedure.

Amplatz and glide wires could not be passed through the existing
nephrostomy. A Kumpe be catheter was advanced into the ileal
conduit. It was advanced over a roadrunner wire along the path of
the existing nephrostomy to the left renal pelvis. It was removed
over an Amplatz wire. The nephrostomy was removed. A new 10 French
nephro ureteral catheter was then advanced over the Amplatz wire to
the left renal pelvis. It was looped and string fixed. Contrast was
injected.
FINDINGS: Imaging confirms exchange of the left nephro ureteral catheter.
IMPRESSION: Successful 10 French left nephro ureteral catheter exchange.

## 2018-09-13 IMAGING — RF DG ABDOMEN 1V
1 series · 5 of 5 positions shown · non-contrast
Comparison: 10/13/2016 .

CLINICAL DATA: Percutaneous nephrolithotomy .

EXAM:
ABDOMEN - 1 VIEW; DG C-ARM [id number] MIN-NO REPORT

[Series 1: run · 5 of 5 slices shown]
[im 1/5]
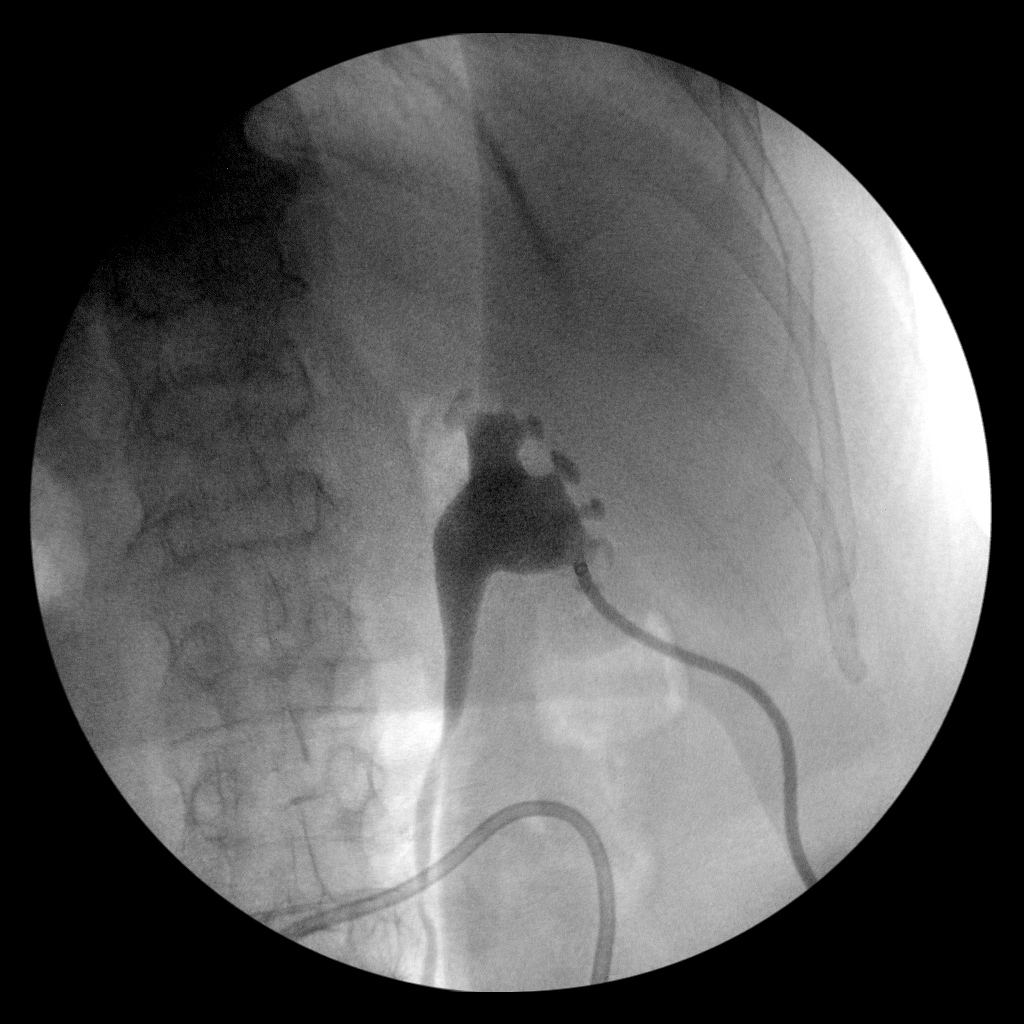
[im 2/5]
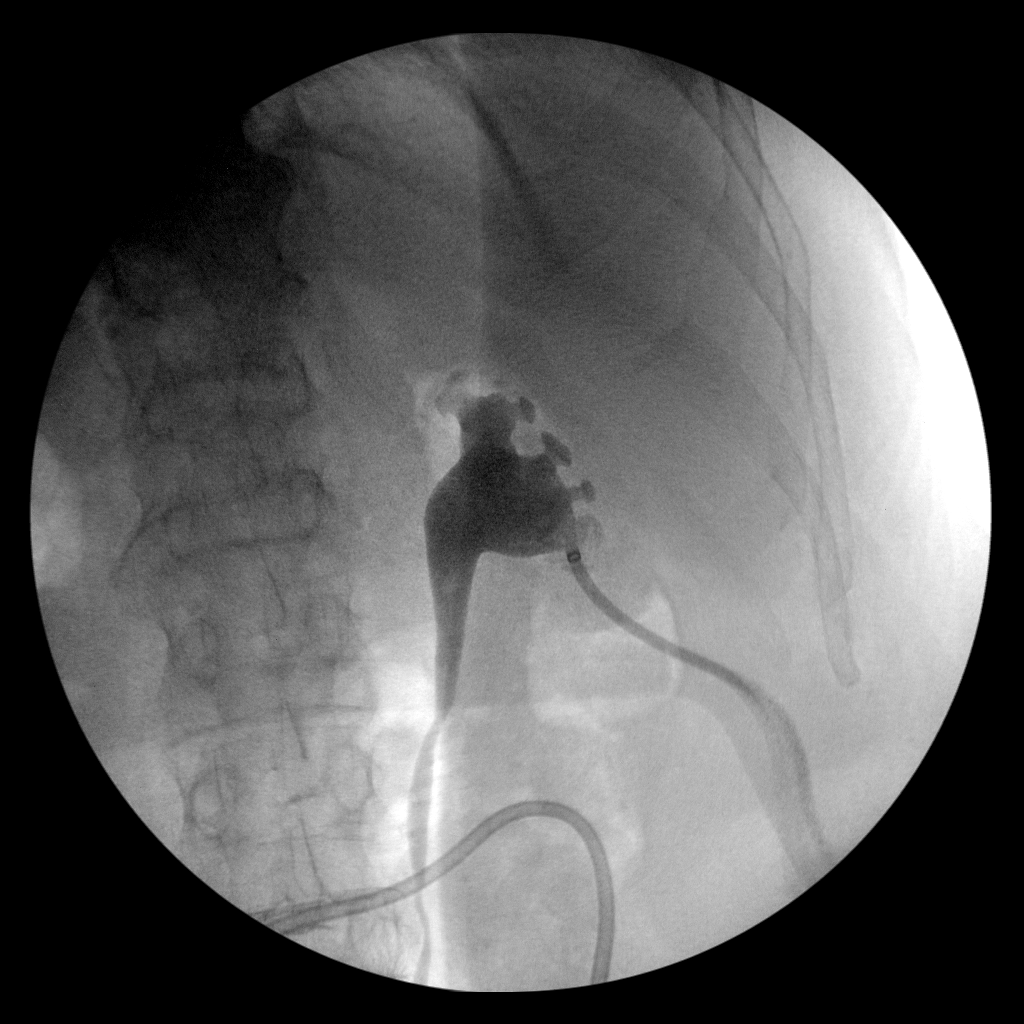
[im 3/5]
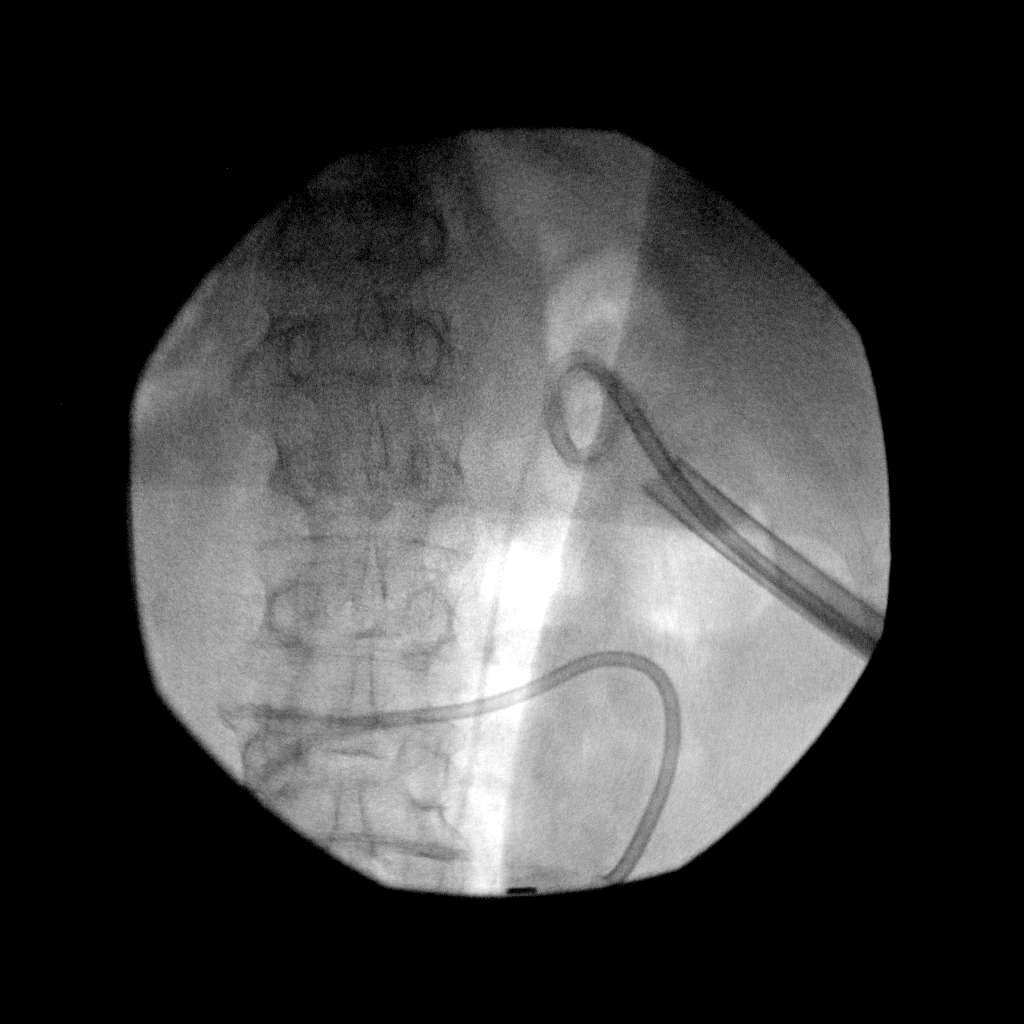
[im 4/5]
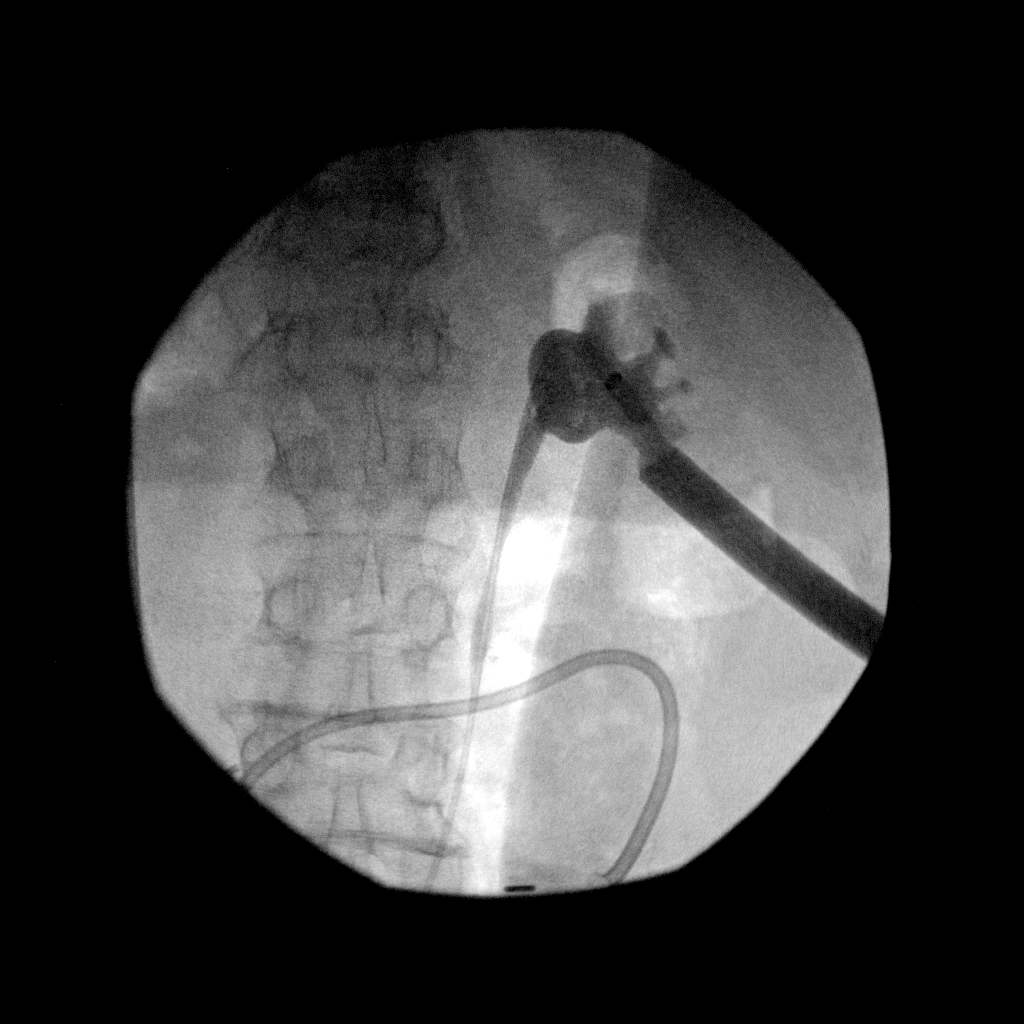
[im 5/5]
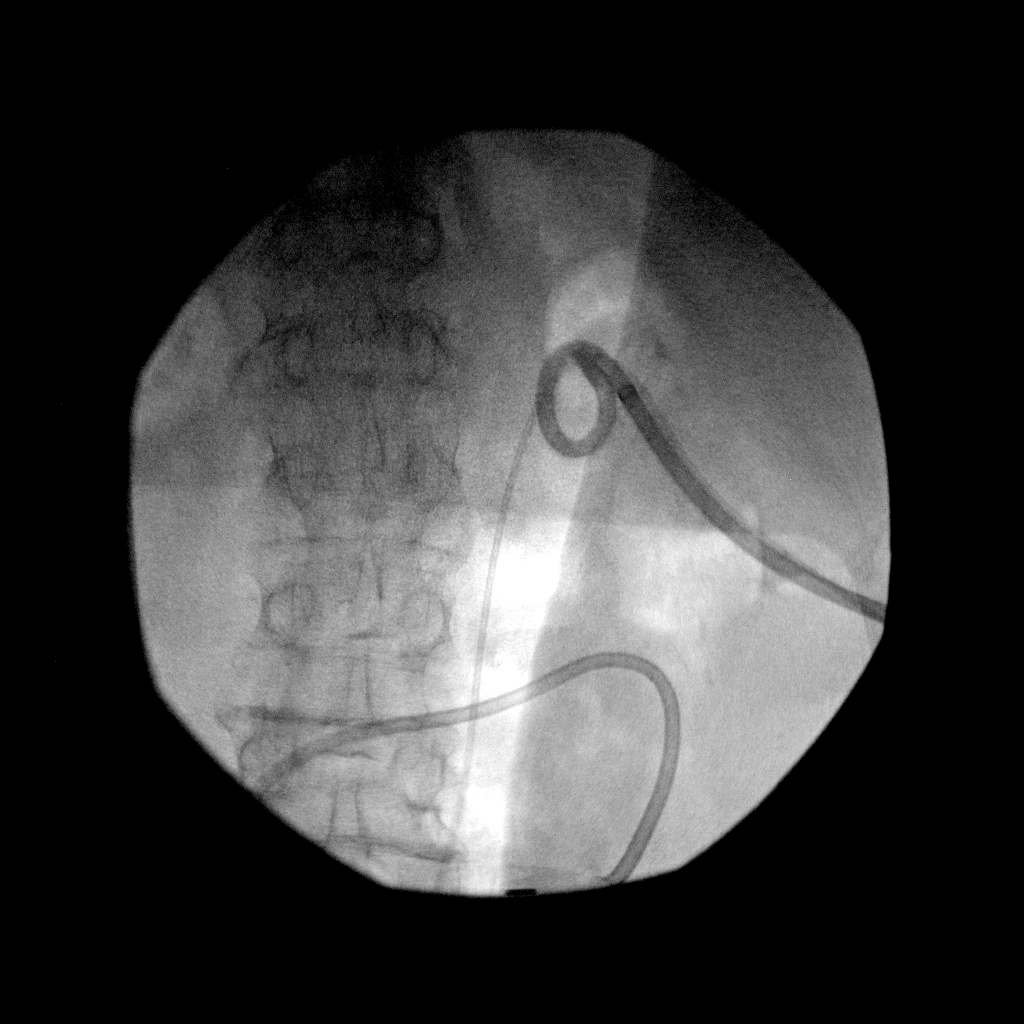

[5 of 5 positions shown; findings below may reference images not displayed]

FINDINGS: Images from an intraoperative percutaneous nephrolithotomy present.
Contrast in the renal collecting system. Ureteral stent noted in the
renal collecting system. Nephrostomy catheter renal collecting
system.
IMPRESSION: Postsurgical changes. Nephrostomy tube and ureteral stent appear to
be in good anatomic position.
